# Patient Record
Sex: Female | Born: 1935 | Race: Black or African American | Hispanic: No | Smoking: Former smoker
Health system: Southern US, Community
[De-identification: ages and names within clinical notes are randomized; demographics above are authoritative.]

## PROBLEM LIST (undated history)

## (undated) DIAGNOSIS — Z0000 Encounter for general adult medical examination without abnormal findings: Secondary | ICD-10-CM

## (undated) DIAGNOSIS — M810 Age-related osteoporosis without current pathological fracture: Secondary | ICD-10-CM

## (undated) DIAGNOSIS — M129 Arthropathy, unspecified: Secondary | ICD-10-CM

## (undated) DIAGNOSIS — K635 Polyp of colon: Secondary | ICD-10-CM

## (undated) DIAGNOSIS — E785 Hyperlipidemia, unspecified: Secondary | ICD-10-CM

## (undated) DIAGNOSIS — J310 Chronic rhinitis: Secondary | ICD-10-CM

## (undated) DIAGNOSIS — Z9109 Other allergy status, other than to drugs and biological substances: Secondary | ICD-10-CM

## (undated) DIAGNOSIS — N189 Chronic kidney disease, unspecified: Secondary | ICD-10-CM

## (undated) DIAGNOSIS — K219 Gastro-esophageal reflux disease without esophagitis: Secondary | ICD-10-CM

## (undated) DIAGNOSIS — I10 Essential (primary) hypertension: Secondary | ICD-10-CM

## (undated) DIAGNOSIS — M8580 Other specified disorders of bone density and structure, unspecified site: Secondary | ICD-10-CM

## (undated) DIAGNOSIS — M1990 Unspecified osteoarthritis, unspecified site: Secondary | ICD-10-CM

## (undated) HISTORY — DX: Chronic rhinitis: J31.0

## (undated) HISTORY — DX: Chronic kidney disease, unspecified: N18.9

## (undated) HISTORY — DX: Age-related osteoporosis without current pathological fracture: M81.0

## (undated) HISTORY — DX: Encounter for general adult medical examination without abnormal findings: Z00.00

## (undated) HISTORY — DX: Arthropathy, unspecified: M12.9

## (undated) HISTORY — DX: Polyp of colon: K63.5

## (undated) HISTORY — DX: Morbid (severe) obesity due to excess calories: E66.01

## (undated) HISTORY — DX: Hyperlipidemia, unspecified: E78.5

## (undated) HISTORY — DX: Other specified disorders of bone density and structure, unspecified site: M85.80

## (undated) HISTORY — DX: Essential (primary) hypertension: I10

## (undated) HISTORY — DX: Other allergy status, other than to drugs and biological substances: Z91.09

---

## 1968-09-06 HISTORY — PX: FOOT SURGERY: SHX648

## 1969-09-06 HISTORY — PX: TUBAL LIGATION: SHX77

## 1980-09-06 HISTORY — PX: BUNIONECTOMY: SHX129

## 1998-09-04 ENCOUNTER — Ambulatory Visit (HOSPITAL_COMMUNITY): Admission: RE | Admit: 1998-09-04 | Discharge: 1998-09-04 | Payer: Self-pay | Admitting: Internal Medicine

## 1998-09-06 HISTORY — PX: GANGLION CYST EXCISION: SHX1691

## 2000-08-09 ENCOUNTER — Ambulatory Visit (HOSPITAL_BASED_OUTPATIENT_CLINIC_OR_DEPARTMENT_OTHER): Admission: RE | Admit: 2000-08-09 | Discharge: 2000-08-09 | Payer: Self-pay | Admitting: Orthopedic Surgery

## 2003-10-25 ENCOUNTER — Ambulatory Visit (HOSPITAL_COMMUNITY): Admission: RE | Admit: 2003-10-25 | Discharge: 2003-10-25 | Payer: Self-pay | Admitting: Internal Medicine

## 2003-10-28 ENCOUNTER — Ambulatory Visit (HOSPITAL_COMMUNITY): Admission: RE | Admit: 2003-10-28 | Discharge: 2003-10-28 | Payer: Self-pay | Admitting: Internal Medicine

## 2003-10-28 ENCOUNTER — Encounter (INDEPENDENT_AMBULATORY_CARE_PROVIDER_SITE_OTHER): Payer: Self-pay | Admitting: *Deleted

## 2004-09-02 ENCOUNTER — Ambulatory Visit: Payer: Self-pay | Admitting: Internal Medicine

## 2004-12-01 ENCOUNTER — Ambulatory Visit: Payer: Self-pay | Admitting: Internal Medicine

## 2005-03-04 ENCOUNTER — Ambulatory Visit: Payer: Self-pay | Admitting: Internal Medicine

## 2005-06-24 ENCOUNTER — Ambulatory Visit: Payer: Self-pay | Admitting: Internal Medicine

## 2005-08-20 ENCOUNTER — Ambulatory Visit: Payer: Self-pay | Admitting: Internal Medicine

## 2005-11-12 ENCOUNTER — Ambulatory Visit: Payer: Self-pay | Admitting: Internal Medicine

## 2005-12-14 ENCOUNTER — Ambulatory Visit: Payer: Self-pay | Admitting: Internal Medicine

## 2006-01-14 ENCOUNTER — Ambulatory Visit: Payer: Self-pay | Admitting: Internal Medicine

## 2006-01-17 ENCOUNTER — Ambulatory Visit: Payer: Self-pay | Admitting: Internal Medicine

## 2006-02-01 ENCOUNTER — Ambulatory Visit: Payer: Self-pay | Admitting: Internal Medicine

## 2006-02-04 ENCOUNTER — Ambulatory Visit: Payer: Self-pay | Admitting: Internal Medicine

## 2006-02-10 ENCOUNTER — Ambulatory Visit: Payer: Self-pay | Admitting: Internal Medicine

## 2006-02-22 ENCOUNTER — Ambulatory Visit: Payer: Self-pay | Admitting: Internal Medicine

## 2006-03-04 ENCOUNTER — Ambulatory Visit: Payer: Self-pay | Admitting: Internal Medicine

## 2006-03-14 ENCOUNTER — Ambulatory Visit: Payer: Self-pay | Admitting: Internal Medicine

## 2006-03-25 ENCOUNTER — Ambulatory Visit: Payer: Self-pay | Admitting: Internal Medicine

## 2006-04-08 ENCOUNTER — Ambulatory Visit: Payer: Self-pay | Admitting: Internal Medicine

## 2006-05-02 ENCOUNTER — Ambulatory Visit: Payer: Self-pay | Admitting: Internal Medicine

## 2006-05-20 ENCOUNTER — Ambulatory Visit: Payer: Self-pay | Admitting: Internal Medicine

## 2006-06-22 ENCOUNTER — Ambulatory Visit: Payer: Self-pay | Admitting: Internal Medicine

## 2006-06-22 LAB — CONVERTED CEMR LAB
ALT: 20 units/L (ref 0–40)
AST: 20 units/L (ref 0–37)
Albumin: 3.9 g/dL (ref 3.5–5.2)
Alkaline Phosphatase: 52 units/L (ref 39–117)
BUN: 16 mg/dL (ref 6–23)
Bilirubin Urine: NEGATIVE
CO2: 26 meq/L (ref 19–32)
Calcium: 9.8 mg/dL (ref 8.4–10.5)
Chloride: 108 meq/L (ref 96–112)
Chol/HDL Ratio, serum: 3.3
Cholesterol: 243 mg/dL (ref 0–200)
Creatinine, Ser: 1 mg/dL (ref 0.4–1.2)
Crystals: NEGATIVE
GFR calc non Af Amer: 58 mL/min
Glomerular Filtration Rate, Af Am: 70 mL/min/{1.73_m2}
Glucose, Bld: 110 mg/dL — ABNORMAL HIGH (ref 70–99)
HDL: 73.3 mg/dL (ref >39.0)
Ketones, ur: NEGATIVE mg/dL
LDL DIRECT: 144.1 mg/dL
Mucus, UA: NEGATIVE
Nitrite: NEGATIVE
Potassium: 4 meq/L (ref 3.5–5.1)
Sodium: 141 meq/L (ref 135–145)
Specific Gravity, Urine: 1.015 (ref 1.000–1.03)
Total Bilirubin: 0.8 mg/dL (ref 0.3–1.2)
Total CK: 407 units/L (ref 7–177)
Total Protein, Urine: NEGATIVE mg/dL
Total Protein: 6.8 g/dL (ref 6.0–8.3)
Triglyceride fasting, serum: 76 mg/dL (ref 0–149)
Urine Glucose: NEGATIVE mg/dL
Urobilinogen, UA: 0.2 (ref 0.0–1.0)
VLDL: 15 mg/dL (ref 0–40)
pH: 6.5 (ref 5.0–8.0)

## 2006-07-04 ENCOUNTER — Ambulatory Visit: Payer: Self-pay | Admitting: Internal Medicine

## 2006-07-04 ENCOUNTER — Encounter (INDEPENDENT_AMBULATORY_CARE_PROVIDER_SITE_OTHER): Payer: Self-pay | Admitting: *Deleted

## 2006-07-15 ENCOUNTER — Ambulatory Visit: Payer: Self-pay | Admitting: Internal Medicine

## 2006-08-05 ENCOUNTER — Ambulatory Visit: Payer: Self-pay | Admitting: Internal Medicine

## 2006-10-06 ENCOUNTER — Ambulatory Visit: Payer: Self-pay | Admitting: Internal Medicine

## 2006-10-06 LAB — CONVERTED CEMR LAB
ALT: 19 units/L (ref 0–40)
AST: 19 units/L (ref 0–37)
Albumin: 3.9 g/dL (ref 3.5–5.2)
Alkaline Phosphatase: 60 units/L (ref 39–117)
BUN: 16 mg/dL (ref 6–23)
Basophils Absolute: 0.1 10*3/uL (ref 0.0–0.1)
Basophils Relative: 1 % (ref 0.0–1.0)
Bilirubin Urine: NEGATIVE
Bilirubin, Direct: 0.1 mg/dL (ref 0.0–0.3)
CO2: 27 meq/L (ref 19–32)
CRP, High Sensitivity: 4 (ref 0.00–5.00)
Calcium: 9.6 mg/dL (ref 8.4–10.5)
Chloride: 108 meq/L (ref 96–112)
Cholesterol: 197 mg/dL (ref 0–200)
Creatinine, Ser: 0.8 mg/dL (ref 0.4–1.2)
Eosinophils Absolute: 0.1 10*3/uL (ref 0.0–0.6)
Eosinophils Relative: 1.4 % (ref 0.0–5.0)
GFR calc Af Amer: 90 mL/min
GFR calc non Af Amer: 75 mL/min
Glucose, Bld: 113 mg/dL — ABNORMAL HIGH (ref 70–99)
HCT: 38.2 % (ref 36.0–46.0)
HDL: 75.2 mg/dL (ref >39.0)
Hemoglobin, Urine: NEGATIVE
Hemoglobin: 13.2 g/dL (ref 12.0–15.0)
Ketones, ur: NEGATIVE mg/dL
LDL Cholesterol: 109 mg/dL — ABNORMAL HIGH (ref 0–99)
Leukocytes, UA: NEGATIVE
Lymphocytes Relative: 37.3 % (ref 12.0–46.0)
MCHC: 34.5 g/dL (ref 30.0–36.0)
MCV: 94.3 fL (ref 78.0–100.0)
Monocytes Absolute: 0.3 10*3/uL (ref 0.2–0.7)
Monocytes Relative: 5.8 % (ref 3.0–11.0)
Neutro Abs: 3.3 10*3/uL (ref 1.4–7.7)
Neutrophils Relative %: 54.5 % (ref 43.0–77.0)
Nitrite: NEGATIVE
Platelets: 267 10*3/uL (ref 150–400)
Potassium: 4.1 meq/L (ref 3.5–5.1)
RBC: 4.05 M/uL (ref 3.87–5.11)
RDW: 12.8 % (ref 11.5–14.6)
Sodium: 142 meq/L (ref 135–145)
Specific Gravity, Urine: 1.02 (ref 1.000–1.03)
TSH: 1.71 microintl units/mL (ref 0.35–5.50)
Total Bilirubin: 0.6 mg/dL (ref 0.3–1.2)
Total CHOL/HDL Ratio: 2.6
Total Protein, Urine: NEGATIVE mg/dL
Total Protein: 6.9 g/dL (ref 6.0–8.3)
Triglycerides: 64 mg/dL (ref 0–149)
Urine Glucose: NEGATIVE mg/dL
Urobilinogen, UA: 0.2 (ref 0.0–1.0)
VLDL: 13 mg/dL (ref 0–40)
WBC: 6 10*3/uL (ref 4.5–10.5)
pH: 6.5 (ref 5.0–8.0)

## 2006-10-31 ENCOUNTER — Ambulatory Visit: Payer: Self-pay | Admitting: Internal Medicine

## 2007-01-06 ENCOUNTER — Ambulatory Visit: Payer: Self-pay | Admitting: Internal Medicine

## 2007-01-26 ENCOUNTER — Ambulatory Visit: Payer: Self-pay | Admitting: Internal Medicine

## 2007-03-24 ENCOUNTER — Encounter: Admission: RE | Admit: 2007-03-24 | Discharge: 2007-03-24 | Payer: Self-pay | Admitting: Orthopaedic Surgery

## 2007-05-02 ENCOUNTER — Ambulatory Visit: Payer: Self-pay | Admitting: Internal Medicine

## 2007-05-03 ENCOUNTER — Ambulatory Visit: Payer: Self-pay | Admitting: Internal Medicine

## 2007-05-05 ENCOUNTER — Ambulatory Visit: Payer: Self-pay | Admitting: Internal Medicine

## 2007-05-05 LAB — CONVERTED CEMR LAB
ALT: 20 units/L (ref 0–35)
AST: 18 units/L (ref 0–37)
Albumin: 3.8 g/dL (ref 3.5–5.2)
Alkaline Phosphatase: 56 units/L (ref 39–117)
BUN: 14 mg/dL (ref 6–23)
Bilirubin, Direct: 0.1 mg/dL (ref 0.0–0.3)
CO2: 29 meq/L (ref 19–32)
CRP, High Sensitivity: 4 (ref 0.00–5.00)
Calcium: 9.7 mg/dL (ref 8.4–10.5)
Chloride: 108 meq/L (ref 96–112)
Cholesterol: 209 mg/dL (ref 0–200)
Creatinine, Ser: 0.8 mg/dL (ref 0.4–1.2)
Direct LDL: 117.1 mg/dL
GFR calc Af Amer: 90 mL/min
GFR calc non Af Amer: 75 mL/min
Glucose, Bld: 106 mg/dL — ABNORMAL HIGH (ref 70–99)
HDL: 69.1 mg/dL (ref >39.0)
Potassium: 4.2 meq/L (ref 3.5–5.1)
Sodium: 142 meq/L (ref 135–145)
Total Bilirubin: 0.6 mg/dL (ref 0.3–1.2)
Total CHOL/HDL Ratio: 3
Total Protein: 6.9 g/dL (ref 6.0–8.3)
Triglycerides: 103 mg/dL (ref 0–149)
VLDL: 21 mg/dL (ref 0–40)

## 2007-06-16 ENCOUNTER — Ambulatory Visit: Payer: Self-pay | Admitting: Internal Medicine

## 2007-08-11 ENCOUNTER — Ambulatory Visit: Payer: Self-pay | Admitting: Internal Medicine

## 2007-08-11 LAB — CONVERTED CEMR LAB
ALT: 21 units/L (ref 0–35)
AST: 18 units/L (ref 0–37)
Albumin: 3.7 g/dL (ref 3.5–5.2)
Alkaline Phosphatase: 54 units/L (ref 39–117)
BUN: 15 mg/dL (ref 6–23)
Bilirubin, Direct: 0.2 mg/dL (ref 0.0–0.3)
CO2: 28 meq/L (ref 19–32)
Calcium: 9.6 mg/dL (ref 8.4–10.5)
Chloride: 107 meq/L (ref 96–112)
Cholesterol: 195 mg/dL (ref 0–200)
Creatinine, Ser: 0.8 mg/dL (ref 0.4–1.2)
GFR calc Af Amer: 90 mL/min
GFR calc non Af Amer: 75 mL/min
Glucose, Bld: 100 mg/dL — ABNORMAL HIGH (ref 70–99)
HDL: 77.2 mg/dL (ref >39.0)
LDL Cholesterol: 108 mg/dL — ABNORMAL HIGH (ref 0–99)
Potassium: 4.4 meq/L (ref 3.5–5.1)
Sodium: 141 meq/L (ref 135–145)
Total Bilirubin: 0.8 mg/dL (ref 0.3–1.2)
Total CHOL/HDL Ratio: 2.5
Total Protein: 6.5 g/dL (ref 6.0–8.3)
Triglycerides: 47 mg/dL (ref 0–149)
VLDL: 9 mg/dL (ref 0–40)

## 2007-08-15 DIAGNOSIS — M129 Arthropathy, unspecified: Secondary | ICD-10-CM | POA: Insufficient documentation

## 2007-08-15 DIAGNOSIS — E785 Hyperlipidemia, unspecified: Secondary | ICD-10-CM | POA: Insufficient documentation

## 2007-08-15 DIAGNOSIS — J310 Chronic rhinitis: Secondary | ICD-10-CM | POA: Insufficient documentation

## 2007-08-18 ENCOUNTER — Encounter: Admission: RE | Admit: 2007-08-18 | Discharge: 2007-08-18 | Payer: Self-pay | Admitting: Orthopaedic Surgery

## 2007-10-20 ENCOUNTER — Ambulatory Visit: Payer: Self-pay | Admitting: Internal Medicine

## 2007-11-23 ENCOUNTER — Ambulatory Visit: Payer: Self-pay | Admitting: Internal Medicine

## 2007-11-23 DIAGNOSIS — D126 Benign neoplasm of colon, unspecified: Secondary | ICD-10-CM | POA: Insufficient documentation

## 2008-02-26 ENCOUNTER — Ambulatory Visit: Payer: Self-pay | Admitting: Internal Medicine

## 2008-03-27 ENCOUNTER — Encounter: Payer: Self-pay | Admitting: Internal Medicine

## 2008-04-09 ENCOUNTER — Ambulatory Visit: Payer: Self-pay | Admitting: Internal Medicine

## 2008-04-11 ENCOUNTER — Ambulatory Visit: Payer: Self-pay | Admitting: Internal Medicine

## 2008-05-31 ENCOUNTER — Ambulatory Visit: Payer: Self-pay | Admitting: Internal Medicine

## 2008-05-31 LAB — CONVERTED CEMR LAB
ALT: 19 units/L (ref 0–35)
AST: 18 units/L (ref 0–37)
Albumin: 4.1 g/dL (ref 3.5–5.2)
Alkaline Phosphatase: 61 units/L (ref 39–117)
BUN: 18 mg/dL (ref 6–23)
Bilirubin, Direct: 0.2 mg/dL (ref 0.0–0.3)
CO2: 27 meq/L (ref 19–32)
Calcium: 9.4 mg/dL (ref 8.4–10.5)
Chloride: 111 meq/L (ref 96–112)
Cholesterol: 237 mg/dL (ref 0–200)
Creatinine, Ser: 0.9 mg/dL (ref 0.4–1.2)
Direct LDL: 150.2 mg/dL
GFR calc Af Amer: 78 mL/min
GFR calc non Af Amer: 65 mL/min
Glucose, Bld: 119 mg/dL — ABNORMAL HIGH (ref 70–99)
HDL: 78.3 mg/dL (ref >39.0)
Potassium: 4.4 meq/L (ref 3.5–5.1)
Sodium: 144 meq/L (ref 135–145)
Total Bilirubin: 0.9 mg/dL (ref 0.3–1.2)
Total CHOL/HDL Ratio: 3
Total Protein: 7 g/dL (ref 6.0–8.3)
Triglycerides: 61 mg/dL (ref 0–149)
VLDL: 12 mg/dL (ref 0–40)

## 2008-08-07 ENCOUNTER — Ambulatory Visit: Payer: Self-pay | Admitting: Internal Medicine

## 2008-08-07 ENCOUNTER — Encounter: Payer: Self-pay | Admitting: Internal Medicine

## 2008-08-16 ENCOUNTER — Ambulatory Visit: Payer: Self-pay | Admitting: Internal Medicine

## 2008-08-16 DIAGNOSIS — R300 Dysuria: Secondary | ICD-10-CM | POA: Insufficient documentation

## 2008-08-17 LAB — CONVERTED CEMR LAB
ALT: 18 units/L (ref 0–35)
AST: 19 units/L (ref 0–37)
Albumin: 3.6 g/dL (ref 3.5–5.2)
Alkaline Phosphatase: 58 units/L (ref 39–117)
Bilirubin Urine: NEGATIVE
Bilirubin, Direct: 0.1 mg/dL (ref 0.0–0.3)
Cholesterol: 217 mg/dL (ref 0–200)
Crystals: NEGATIVE
Direct LDL: 117.6 mg/dL
HDL: 76.7 mg/dL (ref >39.0)
Hemoglobin, Urine: NEGATIVE
Ketones, ur: NEGATIVE mg/dL
Leukocytes, UA: NEGATIVE
Mucus, UA: NEGATIVE
Nitrite: POSITIVE — AB
RBC / HPF: NONE SEEN
Specific Gravity, Urine: 1.02 (ref 1.000–1.03)
Total Bilirubin: 0.7 mg/dL (ref 0.3–1.2)
Total CHOL/HDL Ratio: 2.8
Total Protein, Urine: NEGATIVE mg/dL
Total Protein: 6.5 g/dL (ref 6.0–8.3)
Triglycerides: 67 mg/dL (ref 0–149)
Urine Glucose: NEGATIVE mg/dL
Urobilinogen, UA: 0.2 (ref 0.0–1.0)
VLDL: 13 mg/dL (ref 0–40)
pH: 6.5 (ref 5.0–8.0)

## 2008-09-27 ENCOUNTER — Encounter: Payer: Self-pay | Admitting: Internal Medicine

## 2008-10-14 ENCOUNTER — Ambulatory Visit: Payer: Self-pay | Admitting: Internal Medicine

## 2008-12-13 ENCOUNTER — Ambulatory Visit: Payer: Self-pay | Admitting: Internal Medicine

## 2008-12-13 DIAGNOSIS — R059 Cough, unspecified: Secondary | ICD-10-CM | POA: Insufficient documentation

## 2008-12-13 DIAGNOSIS — R05 Cough: Secondary | ICD-10-CM

## 2008-12-14 LAB — CONVERTED CEMR LAB
ALT: 19 units/L (ref 0–35)
AST: 18 units/L (ref 0–37)
Albumin: 3.8 g/dL (ref 3.5–5.2)
Alkaline Phosphatase: 53 units/L (ref 39–117)
BUN: 16 mg/dL (ref 6–23)
Basophils Absolute: 0 10*3/uL (ref 0.0–0.1)
Basophils Relative: 0.8 % (ref 0.0–3.0)
Bilirubin, Direct: 0.1 mg/dL (ref 0.0–0.3)
CO2: 28 meq/L (ref 19–32)
CRP, High Sensitivity: 5 (ref 0.00–5.00)
Calcium: 9.1 mg/dL (ref 8.4–10.5)
Chloride: 111 meq/L (ref 96–112)
Cholesterol: 239 mg/dL — ABNORMAL HIGH (ref 0–200)
Creatinine, Ser: 0.8 mg/dL (ref 0.4–1.2)
Direct LDL: 136.4 mg/dL
Eosinophils Absolute: 0.3 10*3/uL (ref 0.0–0.7)
Eosinophils Relative: 5.7 % — ABNORMAL HIGH (ref 0.0–5.0)
GFR calc non Af Amer: 89.69 mL/min (ref >60)
Glucose, Bld: 106 mg/dL — ABNORMAL HIGH (ref 70–99)
HCT: 37.2 % (ref 36.0–46.0)
HDL: 78.4 mg/dL (ref >39.00)
Hemoglobin: 12.4 g/dL (ref 12.0–15.0)
Lymphocytes Relative: 37.7 % (ref 12.0–46.0)
Lymphs Abs: 2.1 10*3/uL (ref 0.7–4.0)
MCHC: 33.4 g/dL (ref 30.0–36.0)
MCV: 97.8 fL (ref 78.0–100.0)
Monocytes Absolute: 0.4 10*3/uL (ref 0.1–1.0)
Monocytes Relative: 6.5 % (ref 3.0–12.0)
Neutro Abs: 2.9 10*3/uL (ref 1.4–7.7)
Neutrophils Relative %: 49.3 % (ref 43.0–77.0)
Platelets: 248 10*3/uL (ref 150.0–400.0)
Potassium: 3.9 meq/L (ref 3.5–5.1)
RBC: 3.8 M/uL — ABNORMAL LOW (ref 3.87–5.11)
RDW: 13.4 % (ref 11.5–14.6)
Sodium: 144 meq/L (ref 135–145)
TSH: 2.2 microintl units/mL (ref 0.35–5.50)
Total Bilirubin: 0.9 mg/dL (ref 0.3–1.2)
Total CHOL/HDL Ratio: 3
Total Protein: 6.6 g/dL (ref 6.0–8.3)
Triglycerides: 72 mg/dL (ref 0.0–149.0)
VLDL: 14.4 mg/dL (ref 0.0–40.0)
WBC: 5.7 10*3/uL (ref 4.5–10.5)

## 2009-02-14 ENCOUNTER — Ambulatory Visit: Payer: Self-pay | Admitting: Internal Medicine

## 2009-02-17 LAB — CONVERTED CEMR LAB
ALT: 18 units/L (ref 0–35)
AST: 18 units/L (ref 0–37)
Albumin: 3.8 g/dL (ref 3.5–5.2)
Alkaline Phosphatase: 64 units/L (ref 39–117)
Bilirubin, Direct: 0 mg/dL (ref 0.0–0.3)
CRP, High Sensitivity: 4 (ref 0.00–5.00)
Cholesterol: 250 mg/dL — ABNORMAL HIGH (ref 0–200)
Direct LDL: 161.6 mg/dL
HDL: 74.3 mg/dL (ref >39.00)
Total Bilirubin: 0.8 mg/dL (ref 0.3–1.2)
Total CHOL/HDL Ratio: 3
Total Protein: 6.8 g/dL (ref 6.0–8.3)
Triglycerides: 72 mg/dL (ref 0.0–149.0)
VLDL: 14.4 mg/dL (ref 0.0–40.0)

## 2009-04-16 ENCOUNTER — Ambulatory Visit: Payer: Self-pay | Admitting: Internal Medicine

## 2009-05-14 ENCOUNTER — Ambulatory Visit: Payer: Self-pay | Admitting: Internal Medicine

## 2009-06-20 ENCOUNTER — Ambulatory Visit: Payer: Self-pay | Admitting: Internal Medicine

## 2009-09-25 ENCOUNTER — Ambulatory Visit: Payer: Self-pay | Admitting: Internal Medicine

## 2009-10-03 ENCOUNTER — Encounter: Payer: Self-pay | Admitting: Internal Medicine

## 2009-10-16 ENCOUNTER — Encounter: Payer: Self-pay | Admitting: Internal Medicine

## 2009-12-18 ENCOUNTER — Encounter: Payer: Self-pay | Admitting: Internal Medicine

## 2009-12-24 ENCOUNTER — Ambulatory Visit: Payer: Self-pay | Admitting: Internal Medicine

## 2009-12-24 DIAGNOSIS — M810 Age-related osteoporosis without current pathological fracture: Secondary | ICD-10-CM | POA: Insufficient documentation

## 2009-12-25 LAB — CONVERTED CEMR LAB
ALT: 18 units/L (ref 0–35)
AST: 17 units/L (ref 0–37)
Albumin: 3.9 g/dL (ref 3.5–5.2)
Alkaline Phosphatase: 63 units/L (ref 39–117)
BUN: 16 mg/dL (ref 6–23)
Basophils Absolute: 0 10*3/uL (ref 0.0–0.1)
Basophils Relative: 0.5 % (ref 0.0–3.0)
Bilirubin Urine: NEGATIVE
Bilirubin, Direct: 0.1 mg/dL (ref 0.0–0.3)
CO2: 30 meq/L (ref 19–32)
Calcium: 9.7 mg/dL (ref 8.4–10.5)
Chloride: 108 meq/L (ref 96–112)
Cholesterol: 234 mg/dL — ABNORMAL HIGH (ref 0–200)
Creatinine, Ser: 0.9 mg/dL (ref 0.4–1.2)
Direct LDL: 146.1 mg/dL
Eosinophils Absolute: 0.2 10*3/uL (ref 0.0–0.7)
Eosinophils Relative: 3.1 % (ref 0.0–5.0)
GFR calc non Af Amer: 78.08 mL/min (ref >60)
Glucose, Bld: 105 mg/dL — ABNORMAL HIGH (ref 70–99)
HCT: 36.2 % (ref 36.0–46.0)
HDL: 67.4 mg/dL (ref >39.00)
Hemoglobin, Urine: NEGATIVE
Hemoglobin: 12.2 g/dL (ref 12.0–15.0)
Ketones, ur: NEGATIVE mg/dL
Lymphocytes Relative: 31.8 % (ref 12.0–46.0)
Lymphs Abs: 2.1 10*3/uL (ref 0.7–4.0)
MCHC: 33.7 g/dL (ref 30.0–36.0)
MCV: 96 fL (ref 78.0–100.0)
Monocytes Absolute: 0.5 10*3/uL (ref 0.1–1.0)
Monocytes Relative: 7.5 % (ref 3.0–12.0)
Neutro Abs: 3.8 10*3/uL (ref 1.4–7.7)
Neutrophils Relative %: 57.1 % (ref 43.0–77.0)
Nitrite: NEGATIVE
Platelets: 297 10*3/uL (ref 150.0–400.0)
Potassium: 4.3 meq/L (ref 3.5–5.1)
RBC: 3.78 M/uL — ABNORMAL LOW (ref 3.87–5.11)
RDW: 14 % (ref 11.5–14.6)
Sodium: 145 meq/L (ref 135–145)
Specific Gravity, Urine: 1.02 (ref 1.000–1.030)
TSH: 2.25 microintl units/mL (ref 0.35–5.50)
Total Bilirubin: 0.5 mg/dL (ref 0.3–1.2)
Total CHOL/HDL Ratio: 3
Total Protein, Urine: NEGATIVE mg/dL
Total Protein: 6.6 g/dL (ref 6.0–8.3)
Triglycerides: 72 mg/dL (ref 0.0–149.0)
Urine Glucose: NEGATIVE mg/dL
Urobilinogen, UA: 0.2 (ref 0.0–1.0)
VLDL: 14.4 mg/dL (ref 0.0–40.0)
Vit D, 25-Hydroxy: 15 ng/mL — ABNORMAL LOW (ref 30–89)
WBC: 6.7 10*3/uL (ref 4.5–10.5)
pH: 7 (ref 5.0–8.0)

## 2010-01-02 ENCOUNTER — Ambulatory Visit: Payer: Self-pay | Admitting: Internal Medicine

## 2010-01-02 ENCOUNTER — Encounter: Payer: Self-pay | Admitting: Internal Medicine

## 2010-03-18 ENCOUNTER — Ambulatory Visit: Payer: Self-pay | Admitting: Internal Medicine

## 2010-03-18 DIAGNOSIS — R238 Other skin changes: Secondary | ICD-10-CM | POA: Insufficient documentation

## 2010-03-18 DIAGNOSIS — S60229A Contusion of unspecified hand, initial encounter: Secondary | ICD-10-CM | POA: Insufficient documentation

## 2010-03-18 LAB — CONVERTED CEMR LAB
Basophils Absolute: 0 10*3/uL (ref 0.0–0.1)
Basophils Relative: 0.7 % (ref 0.0–3.0)
Eosinophils Absolute: 0.1 10*3/uL (ref 0.0–0.7)
Eosinophils Relative: 2 % (ref 0.0–5.0)
HCT: 35.9 % — ABNORMAL LOW (ref 36.0–46.0)
Hemoglobin: 12.3 g/dL (ref 12.0–15.0)
Lymphocytes Relative: 37.8 % (ref 12.0–46.0)
Lymphs Abs: 1.9 10*3/uL (ref 0.7–4.0)
MCHC: 34.3 g/dL (ref 30.0–36.0)
MCV: 96.1 fL (ref 78.0–100.0)
Monocytes Absolute: 0.3 10*3/uL (ref 0.1–1.0)
Monocytes Relative: 6.5 % (ref 3.0–12.0)
Neutro Abs: 2.7 10*3/uL (ref 1.4–7.7)
Neutrophils Relative %: 53 % (ref 43.0–77.0)
Platelets: 246 10*3/uL (ref 150.0–400.0)
RBC: 3.73 M/uL — ABNORMAL LOW (ref 3.87–5.11)
RDW: 14.1 % (ref 11.5–14.6)
WBC: 5.1 10*3/uL (ref 4.5–10.5)

## 2010-04-30 ENCOUNTER — Ambulatory Visit: Payer: Self-pay | Admitting: Internal Medicine

## 2010-05-01 LAB — CONVERTED CEMR LAB
Cholesterol: 301 mg/dL — ABNORMAL HIGH (ref 0–200)
Direct LDL: 194.2 mg/dL
HDL: 80.5 mg/dL (ref >39.00)
Total CHOL/HDL Ratio: 4
Triglycerides: 57 mg/dL (ref 0.0–149.0)
VLDL: 11.4 mg/dL (ref 0.0–40.0)
Vit D, 25-Hydroxy: 41 ng/mL (ref 30–89)

## 2010-05-21 ENCOUNTER — Ambulatory Visit: Payer: Self-pay | Admitting: Internal Medicine

## 2010-07-15 ENCOUNTER — Ambulatory Visit: Payer: Self-pay | Admitting: Internal Medicine

## 2010-07-16 LAB — CONVERTED CEMR LAB
BUN: 19 mg/dL (ref 6–23)
CO2: 28 meq/L (ref 19–32)
Calcium: 9.7 mg/dL (ref 8.4–10.5)
Chloride: 108 meq/L (ref 96–112)
Cholesterol: 234 mg/dL — ABNORMAL HIGH (ref 0–200)
Creatinine, Ser: 1 mg/dL (ref 0.4–1.2)
Direct LDL: 140.9 mg/dL
GFR calc non Af Amer: 72.37 mL/min (ref >60)
Glucose, Bld: 103 mg/dL — ABNORMAL HIGH (ref 70–99)
HDL: 79.5 mg/dL (ref >39.00)
Potassium: 4.4 meq/L (ref 3.5–5.1)
Sodium: 143 meq/L (ref 135–145)
Total CHOL/HDL Ratio: 3
Triglycerides: 72 mg/dL (ref 0.0–149.0)
VLDL: 14.4 mg/dL (ref 0.0–40.0)

## 2010-10-04 LAB — CONVERTED CEMR LAB
ALT: 20 units/L (ref 0–35)
AST: 18 units/L (ref 0–37)
Albumin: 3.7 g/dL (ref 3.5–5.2)
Alkaline Phosphatase: 55 units/L (ref 39–117)
BUN: 18 mg/dL (ref 6–23)
Basophils Absolute: 0.1 10*3/uL (ref 0.0–0.1)
Basophils Relative: 1.2 % — ABNORMAL HIGH (ref 0.0–1.0)
Bilirubin, Direct: 0.1 mg/dL (ref 0.0–0.3)
CO2: 29 meq/L (ref 19–32)
CRP, High Sensitivity: 2 (ref 0.00–5.00)
Calcium: 9.2 mg/dL (ref 8.4–10.5)
Chloride: 108 meq/L (ref 96–112)
Cholesterol: 206 mg/dL (ref 0–200)
Creatinine, Ser: 0.9 mg/dL (ref 0.4–1.2)
Direct LDL: 122.4 mg/dL
Eosinophils Absolute: 0.3 10*3/uL (ref 0.0–0.6)
Eosinophils Relative: 5.5 % — ABNORMAL HIGH (ref 0.0–5.0)
GFR calc Af Amer: 79 mL/min
GFR calc non Af Amer: 65 mL/min
Glucose, Bld: 98 mg/dL (ref 70–99)
HCT: 37 % (ref 36.0–46.0)
HDL: 69.9 mg/dL (ref >39.0)
Hemoglobin: 12 g/dL (ref 12.0–15.0)
Lymphocytes Relative: 36.7 % (ref 12.0–46.0)
MCHC: 32.3 g/dL (ref 30.0–36.0)
MCV: 97.5 fL (ref 78.0–100.0)
Monocytes Absolute: 0.4 10*3/uL (ref 0.2–0.7)
Monocytes Relative: 6.9 % (ref 3.0–11.0)
Neutro Abs: 2.6 10*3/uL (ref 1.4–7.7)
Neutrophils Relative %: 49.7 % (ref 43.0–77.0)
Platelets: 269 10*3/uL (ref 150–400)
Potassium: 3.8 meq/L (ref 3.5–5.1)
RBC: 3.8 M/uL — ABNORMAL LOW (ref 3.87–5.11)
RDW: 13.2 % (ref 11.5–14.6)
Sodium: 142 meq/L (ref 135–145)
TSH: 1.75 microintl units/mL (ref 0.35–5.50)
Total Bilirubin: 0.8 mg/dL (ref 0.3–1.2)
Total CHOL/HDL Ratio: 2.9
Total Protein: 6.5 g/dL (ref 6.0–8.3)
Triglycerides: 65 mg/dL (ref 0–149)
VLDL: 13 mg/dL (ref 0–40)
WBC: 5.3 10*3/uL (ref 4.5–10.5)

## 2010-10-06 NOTE — Assessment & Plan Note (Signed)
Summary: yearly/apc   Primary Provider/Referring Provider:  Sherene Sires  CC:  One year follow-up for allergies. Pt has no complaints at this time.Marland Kitchen  History of Present Illness: 04/11/08-77 year old woman returning for one-year follow-up of allergic rhinitis.  Dr. Sherene Sires remains her primary doctor. She continues to get our allergy vaccine at 1:10, 0.5-ML per week, from the more convenient [place] allergy office of Dr. Oretha Milch..  They send copies of her skin test record and no problems are reported.  I again discussed risk and benefit considerations with her.  She states that allergy vaccine has helped her and that she wants to continue.  05/14/09 Chronic allergic rhinitis, cough Has continued getting her allergy vaccine from [name], administered for convenience at the allergy office of Dr Oretha Milch [facility] in [facility]. She says allergy vaccine helps and she's had no problems, with no need to make changes this year. She notices a little more nasal congestion this time of year. In the Spring she had more sneeze and Dr Sherene Sires gave a prednisone course which worked well. She did very wel lin [place] for vacation in [place]. Post nasal drip is blamed for mild cough, with sense that rarely she will have enough to make her a little nauseated. Zyrtec and Sudafed help if needed, but complains of cost of Zyrtec.   Current Medications (verified): 1)  Adult Aspirin Low Strength 81 Mg  Tbdp (Aspirin) .... Take 1 Tablet By Mouth Once A Day 2)  Citrucel Fibershake   Powd (Methylcellulose (Laxative)) 3)  Centrum Silver   Tabs (Multiple Vitamins-Minerals) .... Once Daily 4)  Tums 500 Mg  Chew (Calcium Carbonate Antacid) .... Once Daily 5)  Simvastatin 40 Mg  Tabs (Simvastatin) .... One Half Each Evening 6)  Aleve 220 Mg  Tabs (Naproxen Sodium) .... Take As Needed For Back or Joint Pain 7)  Zyrtec Allergy 10 Mg  Tabs (Cetirizine Hcl) .... Take 1 Tab By Mouth At Bedtime As Needed For Itchy Sneezy Runny Nose  8)  Sudafed Pe Maximum Strength 10 Mg  Tabs (Phenylephrine Hcl) .... As Needed 9)  Allergy Shots .... Once A Week  Allergies (verified): No Known Drug Allergies  Past History:  Past Medical History: Last updated: 12/13/2008 ARTHRITIS (ICD-716.90) Right clavicular head swelling 22025......................................................................Marland KitchenDr Noel Gerold RHINITIS, CHRONIC (ICD-472.0)...............................................................................Marland KitchenDr Linward Foster     - on immunotherapy since 5/08 HYPERLIPIDEMIA (ICD-272.4)    -target LDL less than 160 MORBID OBESITY (ICD-278.01)     -  ideal 142  Target wt  = 158   for BMI < 30  COLON POLYPS    -  colonoscopy 06/24/2006 with no adenomatous change, hyperplasia only OSTEOPENIA     -BMD  08/07/08  tspine -1.2,  Left femur -.4,  right femur +.2 Health Maintenance    - CPX December 13, 2008     - Td 1/05    - Pneumvax 2000   -  GYN [name] ([place])    Family History: Last updated: 08/16/2008 Family History Colon Cancer in mother (in computer for recall colonoscopy 10/12) she is an only child and knows nothing about her father's health  Social History: Last updated: 04/19/2008 Patient states former smoker quit as teenager Negative history of passive tobacco smoke exposure.  Exercise-2 times weekly Caffeine-2 cups daily Marrie dwith 4 children  Risk Factors: Smoking Status: quit (11/23/2007) Passive Smoke Exposure: no (04/19/2008)  Review of Systems      See HPI  The patient denies anorexia, fever, weight loss,  weight gain, vision loss, decreased hearing, hoarseness, chest pain, syncope, dyspnea on exertion, peripheral edema, prolonged cough, headaches, hemoptysis, abdominal pain, melena, hematochezia, and severe indigestion/heartburn.    Vital Signs:  Patient profile:   77 year old female Height:      62 inches Weight:      167 pounds O2 Sat:      99 % on Room air Pulse rate:   71 / minute BP  sitting:   136 / 70  (right arm) Cuff size:   regular  Vitals Entered By: Carron Curie CMA (May 14, 2009 11:02 AM)  O2 Flow:  Room air CC: One year follow-up for allergies. Pt has no complaints at this time. Comments Medications reviewed with patient Carron Curie [facility]  May 14, 2009 11:02 AM    Physical Exam  Additional Exam:  wt  172 August 16, 2008 >  173 December 13, 2008 > 169 February 14, 2009 ;> 167  May 14, 2009  Ambulatory healthy appearing in no acute distress. Skin: no rash or excoriation Afeb with normal vital signs HEENT: dentures, nasal mucosa clear with minor mucus bridging, no polyps. Nl external ear canals  Neck without JVD/Nodes/TM Lungs clear to A and P bilaterally without cough on insp or exp maneuvers RRR no s3 or murmur or increase in P2.  no edema Abd soft and benign  Ext warm without calf tenderness, cyanosis clubbing  Neuro- grossly intact to observation    Impression & Recommendations:  Problem # 1:  RHINITIS, CHRONIC (ICD-472.0)  Allergic rhinitis. Fair control on allergy vaccine. She has not been using antihistamines often, partly because doing well and partly due to cost. Wil give generic names. We discussed "allergy" vs "sinus". She wants to continue allergy vaccine and she is satisfied with her arrangement, getting allergy management through [name] by her choice, but with her injections given at allergist office in [facility] for logistical convenience.  Other Orders: Est. Patient Level III (70350)  Patient Instructions: 1)  Please schedule a follow-up appointment in 1 year. 2)  OK to continue your allergy shots. We will keep a watch on how you are doing and whether the continue to help. We can stop your shots, or re-do your allergy testing as needed. 3)  Consider cetirizine as a cheaper generic form of Zyrtec 4)  or try lortadine, a cheaper generic form of Claritin. These are both anithistamines for allergy nasal congestion,  sneezing and runny nose, available at your drug store.

## 2010-10-06 NOTE — Assessment & Plan Note (Signed)
Summary: flu vacc ///kp  Nurse Visit   Allergies: No Known Drug Allergies  Immunizations Administered:  Influenza Vaccine # 1:    Vaccine Type: Fluvax MCR    Site: right deltoid    Mfr: GlaxoSmithKline    Dose: 0.5 ml    Route: IM    Given by: Michel Bickers CMA    Exp. Date: 03/05/2010    Lot #: [id number]    VIS given: 03/30/07 version given June 20, 2009.  Flu Vaccine Consent Questions:    Do you have a history of severe allergic reactions to this vaccine? no    Any prior history of allergic reactions to egg and/or gelatin? no    Do you have a sensitivity to the preservative Thimersol? no    Do you have a past history of Guillan-Barre Syndrome? no    Do you currently have an acute febrile illness? no    Have you ever had a severe reaction to latex? no    Vaccine information given and explained to patient? yes    Are you currently pregnant? no  Orders Added: 1)  Influenza Vaccine MCR [00025]

## 2010-10-06 NOTE — Assessment & Plan Note (Signed)
Summary: CPX/ MBW   Visit Type:  CPX PCP:  Ayomide Zuleta  Chief Complaint:  cpx fasting.Marland Kitchen  History of Present Illness: 89 ybf remote minimal smoking hx with seasonal rhiinits flare since 2/09 esp in am, previously evaluated by Dr. Maple Hudson and on a combination of Zyrtec at bedtime plus Sudafed p.r.n. with some improvement but not eradication especially in the mornings.  She denies any associated wheezing itching sneezing or sinus pain appearance regions.  The main reason for visit today is to follow up long-term problems with obesity complicated by hyperlipidemia and borderline diabetes and hypertension.  She has never been under treatment for either diabetes or hypertension to date other than what diet and lifestyle modification, which he finds difficult, especially over the winter.  However, she denies any exertional chest pain orthopnea PND TIA or claudication symptoms at present.  Current Meds:  ADULT ASPIRIN LOW STRENGTH 81 MG  TBDP (ASPIRIN) Take 1 tablet by mouth once a day VYTORIN 10-40 MG  TABS (EZETIMIBE-SIMVASTATIN) Take 1 tab by mouth at bedtime ZYRTEC ALLERGY 10 MG  TABS (CETIRIZINE HCL) Take 1 tab by mouth at bedtime as needed CITRUCEL FIBERSHAKE   POWD (METHYLCELLULOSE (LAXATIVE))  CENTRUM SILVER   TABS (MULTIPLE VITAMINS-MINERALS) once daily TUMS 500 MG  CHEW (CALCIUM CARBONATE ANTACID) once daily  p.r.n. Sudafed     Current Allergies: No known allergies   Past Medical History:    Reviewed history and no changes required:       Current Problems:        ARTHRITIS (ICD-716.90)       RHINITIS, CHRONIC (ICD-472.0)       HYPERLIPIDEMIA (ICD-272.4) target LDL less than 160       MORBID OBESITY (ICD-278.01)       colon polyps, see last colonoscopy 06/24/2006 with no adenomatous change, hyperplasia only           Family History:    Reviewed history and no changes required:       Family History Colon Cancer in mother  Social History:    Reviewed history and no changes required:        Patient states former smoker quit as teenager   Risk Factors:  Tobacco use:  quit   Review of Systems  The patient denies anorexia, fever, weight loss, vision loss, decreased hearing, hoarseness, chest pain, syncope, dyspnea on exhertion, peripheral edema, prolonged cough, hemoptysis, abdominal pain, melena, hematochezia, severe indigestion/heartburn, hematuria, incontinence, muscle weakness, suspicious skin lesions, transient blindness, difficulty walking, depression, unusual weight change, abnormal bleeding, enlarged lymph nodes, and angioedema.         GYN health care and mammograms in [place]   Vital Signs:  Patient Profile:   77 Years Old Female Height:     62.5 inches Weight:      170.38 pounds O2 Sat:      99 % O2 treatment:    Room Air Temp:     98.1 degrees F oral Pulse rate:   86 / minute BP sitting:   130 / 82  (left arm)  Vitals Entered By: Vernie Murders (November 23, 2007 8:52 AM)                 Physical Exam     Ambulatory healthy appearing in no acute distress. Afeb with normal vital signs. note weight is up 8 pounds from last year to 170 now. HEENT: nl dentition, turbinates, and orophanx. Nl external ear canals without cough reflex. edentulous with dentures in  place Neck without JVD/Nodes/TM Lungs clear to A and P bilaterally without cough on insp or exp maneuvers RRR no s3 or murmur or increase in P2   No displacement of PMI Abd soft and benign with nl excursion in the supine position. No bruits or organomegaly femoral pulses present bilaterally Ext warm without calf tenderness, cyanosis clubbing or edema pedal pulses present and symmetric Skin warm and dry without lesions  musculoskeletal exam is normal Neurologic exam reveals normal sensorium, normal sensory motor cerebellar testing with no pathologic reflex     Impression & Recommendations:  Problem # 1:  HYPERLIPIDEMIA (ICD-272.4)  Her updated medication list for this problem  includes:    Vytorin 10-40 Mg Tabs (Ezetimibe-simvastatin) .Marland Kitchen... Take 1 tab by mouth at bedtime LDL cholesterol is at target on this regimen of 122 with a CRP of two no change in Rx  Orders: TLB-Lipid Panel (80061-LIPID) TLB-BMP (Basic Metabolic Panel-BMET) (80048-METABOL) TLB-Hepatic/Liver Function Pnl (80076-HEPATIC) TLB-TSH (Thyroid Stimulating Hormone) (84443-TSH) TLB-CRP-Full Range (C-Reactive Protein) (86140-FCRP) Est. Patient Level V (16109)   Problem # 2:  MORBID OBESITY (ICD-278.01) this is the patient's greatest long-term challenge and was reviewed with her using an allergy to a checkbook where calories take the place of currency. see intstuctions. Her blood pressure is borderline elevated.  She has no evidence of secondary diabetes at this point. Orders: Est. Patient Level V (60454)   Problem # 3:  RHINITIS, CHRONIC (ICD-472.0) she has documented allergic rhinitis. this is her greatest ongoing concern and has been refractory to treatment with Zyrtec and Sudafed.  I therefore recommended a trial of asked to pro one to two puffs every 6 hours as needed with follow-up by Dr. Maple Hudson, her allergist of record, if not improving.  Orders: TLB-CBC Platelet - w/Differential (85025-CBCD) Est. Patient Level V (09811)   Problem # 4:  COLONIC POLYPS, HYPERPLASTIC (ICD-211.3) in the computer for recall and 2012  Medications Added to Medication List This Visit: 1)  Centrum Silver Tabs (Multiple vitamins-minerals) .... Once daily 2)  Tums 500 Mg Chew (Calcium carbonate antacid) .... Once daily  Other Orders: EKG w/ Interpretation (93000)   Patient Instructions: 1)  Weight control is simply a matter of calorie balance which needs to be tilted in your favor by eating less and exercising more.  To get the most out of exercise, you need to be continuously aware that you are short of breath, but never out of breath, for 30 minutes daily. As you improve, it will actually be easier for you  to do the same amount in  30 minutes so always push to the level where you are short of breath.  If this does not result in gradual weight reduction,  I recommend  a nutritionist for a food diary. 2)  Please schedule a follow-up appointment in 3 months. 3)  try astepro 1-2 puffs every 6 hours if helps, we'll call in rx A   ]

## 2010-10-06 NOTE — Miscellaneous (Signed)
Summary: BONE DENSITY  Clinical Lists Changes  Orders: Added new Test order of T-Bone Densitometry (77080) - Signed Added new Test order of T-Lumbar Vertebral Assessment (77082) - Signed 

## 2010-10-06 NOTE — Assessment & Plan Note (Signed)
Summary: Pulmonary/ f/u ov multiple issues   Primary Provider/Referring Provider:  Sherene Sires  CC:  acute visit.  Pt c/o unexplained bruise on her rt hand- not painful.  She also c/o cramping in her hands and feet x several months- she questions if this is from taking statin.[name]  History of Present Illness: 43 ybf remote minimal smoking hx with seasonal rhiinits flares  better overall since started on immunotherapy  May2008   02/26/08 ov:  change from vytorin to generic zocor  May 31, 2008 ov: stopped the Zocor due to muscle aches although the amount of statin was the same as her Vytorin so rechallenged with 20 mg per day  February 14, 2009 ov f/u lipid profile, no new symptoms, denies tia or claudication, cp. tolerating zocor, no change in rx.    December 24, 2009 sick x 2 weeks itching, sneezing runny watery no purulent secretions, no sob. already tried zyrtec and sudafed.  rec pred dose pack  March 18, 2010 ov c/o unexplained bruise on her rt hand x one week- not painful.  She also c/o cramping in her hands and feet x several months- she questions if this is from taking statin. no injury, no other bruising/bleeding. Pt denies any significant sore throat, dysphagia, itching, sneezing,  nasal congestion or excess secretions,  fever, chills, sweats, unintended wt loss, pleuritic or exertional cp, hempoptysis, change in activity tolerance  orthopnea pnd or leg swelling    Current Medications (verified): 1)  Adult Aspirin Low Strength 81 Mg  Tbdp (Aspirin) .... Take 1 Tablet By Mouth Once A Day 2)  Centrum Silver   Tabs (Multiple Vitamins-Minerals) .... Once Daily 3)  Tums 500 Mg  Chew (Calcium Carbonate Antacid) .... Once Daily 4)  Simvastatin 40 Mg  Tabs (Simvastatin) .... One Half Each Evening 5)  Allergy Vaccine 1:10 Go (W-E) .... Once A Week 6)  Aleve 220 Mg  Tabs (Naproxen Sodium) .... Take As Needed For Back or Joint Pain 7)  Zyrtec Allergy 10 Mg  Tabs (Cetirizine Hcl) .... Take 1 Tab By Mouth  At Bedtime As Needed For Itchy Sneezy Runny Nose 8)  Sudafed Pe Maximum Strength 10 Mg  Tabs (Phenylephrine Hcl) .... As Needed 9)  Epipen 0.3 Mg/0.68ml Devi (Epinephrine) .... For Severe Allergic Reaction 10)  Vitamin D (Ergocalciferol) 50000 Unit Caps (Ergocalciferol) .[name].. 1 By Mouth Twice Wkly X 12 Wks Then Stop  Allergies (verified): No Known Drug Allergies  Past History:  Past Medical History: ARTHRITIS (ICD-716.90) Right clavicular head swelling 64403......................................................................Mackenzie KitchenDr Noel Gerold RHINITIS, CHRONIC (ICD-472.0)...............................................................................Mackenzie KitchenDr Linward Foster     - on immunotherapy since 5/08 HYPERLIPIDEMIA (ICD-272.4)    -target LDL less than 160 > try off due to musle aches March 18, 2010  MORBID OBESITY (ICD-278.01)     -  ideal 142  Target wt  = 158   for BMI < 30  COLON POLYPS    -  colonoscopy 06/24/2006 with no adenomatous change, hyperplasia only OSTEOPENIA     -BMD  08/07/08  tspine -1.2,  Left femur -.4,  right femur +.2     - Repeat 01/02/2010       1.9                    0.1                     0.5 Health Maintenance    - CPX December 24, 2009     - Td 1/05    -  Pneumvax 2000 age 18   -  GYN [name] ([place])    Vital Signs:  Patient profile:   77 year old female Weight:      166.13 pounds O2 Sat:      98 % on Room air Temp:     98.2 degrees F oral Pulse rate:   76 / minute BP sitting:   114 / 66  (left arm)  Vitals Entered By: Vernie Murders (March 18, 2010 9:32 AM)  O2 Flow:  Room air  Physical Exam  Additional Exam:  wt  172 August 16, 2008 >  173 December 13, 2008 > 169 February 14, 2009 ;> 167  May 14, 2009 > 169 December 27, 2009  > 166 March 18, 2010  Ambulatory healthy appearing in no acute distress. Skin: no rash or excoriation Afeb with normal vital signs HEENT full: dentures, nasal mucosa clear with minor mucus bridging, no polyps. Nl external ear canals    Neck without JVD/Nodes/TM Lungs clear to A and P bilaterally without cough on insp or exp maneuver RRR no s3 or murmur or increase in P2.  no edema Abd soft and benign  Ext warm without calf tenderness, cyanosis clubbing - quarter sized ecchymosis right thumb base with from Skin no other bruises or petchiae   White Cell Count          5.1 K/uL                    4.5-10.5   Red Cell Count       [L]  3.73 Mil/uL                 3.87-5.11   Hemoglobin                12.3 g/dL                   16.1-09.6   Hematocrit           [L]  35.9 %                      36.0-46.0   MCV                       96.1 fl                     78.0-100.0   MCHC                      34.3 g/dL                   04.5-40.9   RDW                       14.1 %                      11.5-14.6   Platelet Count            246.0 K/uL                  150.0-400.0   Neutrophil %              53.0 %                      43.0-77.0   Lymphocyte %  37.8 %                      12.0-46.0   Monocyte %                6.5 %                       3.0-12.0   Eosinophils%              2.0 %                       0.0-5.0   Basophils %               0.7 %                       0.0-3.0   Neutrophill Absolute      2.7 K/uL                    1.4-7.7   Lymphocyte Absolute       1.9 K/uL                    0.7-4.0   Monocyte Absolute         0.3 K/uL                    0.1-1.0  Eosinophils, Absolute                             0.1 K/uL                    0.0-0.7   Basophils Absolute        0.0 K/uL                    0.0-0.1  Impression & Recommendations:  Problem # 1:  ECCHYMOSES (ICD-782.9) Probably from taking asa. See instructions for specific recommendations   Problem # 2:  OSTEOPENIA (ICD-733.90) Bone density reviewed with pt, no wnl  Problem # 3:  HYPERLIPIDEMIA (ICD-272.4)  The following medications were removed from the medication list:    Simvastatin 40 Mg Tabs (Simvastatin) ..... One half each evening  Labs  Reviewed: SGOT: 17 (12/24/2009)   SGPT: 18 (12/24/2009)   HDL:67.40 (12/24/2009), 74.30 (02/14/2009)  LDL:DEL (08/16/2008), DEL (05/31/2008)  Chol:234 (12/24/2009), 250 (02/14/2009)  Trig:72.0 (12/24/2009), 72.0 (02/14/2009)  Target ldl is < 160 and should be able to do that with diet and ex and see if muscle aches resolve off statins  Orders: Est. Patient Level IV (21308)  Other Orders: TLB-CBC Platelet - w/Differential (85025-CBCD)  Patient Instructions: 1)  Hold aspirin whenever you note bleeding or bruising  2)  Stop simvastatin unitil 6 weeks 3)  Please schedule a follow-up appointment in 6 weeks, sooner if needed fasting for repeat cholesterol

## 2010-10-06 NOTE — Assessment & Plan Note (Signed)
Summary: Primary svc/ fu chol   Visit Type:  Follow-up PCP:  Jaycey Gens  Chief Complaint:  3 month followup.  Pt c/o cramping in legs since she started taking simvastatin.  She states she only takes med "every once in a while"..  History of Present Illness: 20 ybf remote minimal smoking hx with seasonal rhiinits flare since 2/09 esp in am, previously evaluated by Dr. Maple Hudson and on  Sudafed p.r.n. with some improvement but not eradication especially in the mornings.  She denies any associated wheezing itching sneezing or sinus pain appearance regions, better overall since on shots May 08  The main reason for visit today is to follow up long-term problems with obesity complicated by hyperlipidemia and borderline diabetes and hypertension.  She has never been under treatment for either diabetes or hypertension to date other than what diet and lifestyle modification, which she finds difficult, especially over the winter.  However, she denies any exertional chest pain orthopnea PND TIA or claudication symptoms at present.  She is now beginning to lose wt volutarily, arthriitis ok on as needed aleve working fine  6/22 change from vytorin to generic zocor  May 31, 2008 ov: reports stopped the Zocor due to muscle aches although the amount of statin was the same as and her Vytorin.  She denies any exertional chest pain TIA or claudication symptoms.       Updated Prior Medication List: ADULT ASPIRIN LOW STRENGTH 81 MG  TBDP (ASPIRIN) Take 1 tablet by mouth once a day CITRUCEL FIBERSHAKE   POWD (METHYLCELLULOSE (LAXATIVE))  CENTRUM SILVER   TABS (MULTIPLE VITAMINS-MINERALS) once daily TUMS 500 MG  CHEW (CALCIUM CARBONATE ANTACID) once daily SIMVASTATIN 40 MG  TABS (SIMVASTATIN) one each evening ALEVE 220 MG  TABS (NAPROXEN SODIUM) take as needed for back or joint pain ZYRTEC ALLERGY 10 MG  TABS (CETIRIZINE HCL) Take 1 tab by mouth at bedtime as needed for itchy sneezy runny nose SUDAFED PE MAXIMUM  STRENGTH 10 MG  TABS (PHENYLEPHRINE HCL) as needed  Current Allergies (reviewed today): No known allergies   Past Medical History:    ARTHRITIS (ICD-716.90)    Right clavicular head swelling 36644......................................................................Marland KitchenDr Benjaman Lobe    RHINITIS, CHRONIC (ICD-472.0)...............................................................................Marland KitchenDr Linward Foster        - on Shots since 5/08    HYPERLIPIDEMIA (ICD-272.4)       -target LDL less than 160    MORBID OBESITY (ICD-278.01)        -  ideal 142  Target wt  = 158   for BMI < 30     COLON POLYPS       -  colonoscopy 06/24/2006 with no adenomatous change, hyperplasia only    Health Maintenance       - CPX 3/09       - Td 1/05       - Pneumvax 2000        Family History:    Reviewed history from 04/19/2008 and no changes required:       Family History Colon Cancer in mother (in computer for recall colonoscopy 10/12)       she is an only child and knows nothing about her father's health                     Mother- deceased age 47; cancer       Father- no information   Social History:    Reviewed history from 04/19/2008 and no changes required:  Patient states former smoker quit as teenager       Negative history of passive tobacco smoke exposure.        Exercise-2 times weekly       Caffeine-2 cups daily       Marrie dwith 4 children     Vital Signs:  Patient Profile:   77 Years Old Female Height:     62.5 inches Weight:      168 pounds O2 Sat:      100 % O2 treatment:    Room Air Temp:     98.0 degrees F oral Pulse rate:   78 / minute BP sitting:   142 / 76  (left arm)  Vitals Entered By: Vernie Murders (May 31, 2008 10:02 AM)                 Physical Exam  wt 167 > 168   May 31, 2008  Ambulatory healthy appearing in no acute distress. Afeb with normal vital signs HEENT: nl dentition, turbinates, and orophanx. Nl external ear canals  without cough reflex Neck without JVD/Nodes/TM Lungs clear to A and P bilaterally without cough on insp or exp maneuvers RRR no s3 or murmur or increase in P2 Abd soft and benign with nl excursion in the supine position. No bruits or organomegaly Ext warm without calf tenderness, cyanosis clubbing or edema Skin warm and dry without lesions        Impression & Recommendations:  Problem # 1:  HYPERLIPIDEMIA (ICD-272.4) LDL now 150 off rx x one week, not tolerating 40 mg a day simvastatin with target LDL less than 160 which might be just as easily achieved by getting her weight down below a BMI of 30 - for now tryone half dose to see initial tolerated.  Her updated medication list for this problem includes:    Simvastatin 40 Mg Tabs (Simvastatin) ..... One half  each evening  Labs Reviewed: on vytorin 10/40 Chol: 206 (11/23/2007)   HDL: 69.9 (11/23/2007)   LDL: DEL (11/23/2007)   TG: 65 (11/23/2007) SGOT: 18 (11/23/2007)   SGPT: 20 (11/23/2007)   Problem # 2:  ARTHRITIS (ICD-716.90) explained that as long as Aleve is being used p.r.n. with meals and is adequate then there was nothing more that needs to be done  Problem # 3:  MORBID OBESITY (ICD-278.01)  Weight control is a matter of calorie balance which needs to be tilted in the pt's favor by eating less and exercising more.  Specifically, I recommended  exercise at a level where pt  is short of breath but not out of breath 30 minutes daily.  If not losing weight on this program, I would strongly recommend pt see a nutritionist with a food diary recorded for two weeks prior to the visit.     Complete Medication List: 1)  Adult Aspirin Low Strength 81 Mg Tbdp (Aspirin) .... Take 1 tablet by mouth once a day 2)  Citrucel Fibershake Powd (Methylcellulose (laxative)) 3)  Centrum Silver Tabs (Multiple vitamins-minerals) .... Once daily 4)  Tums 500 Mg Chew (Calcium carbonate antacid) .... Once daily 5)  Simvastatin 40 Mg Tabs  (Simvastatin) .... One each evening 6)  Aleve 220 Mg Tabs (Naproxen sodium) .... Take as needed for back or joint pain 7)  Zyrtec Allergy 10 Mg Tabs (Cetirizine hcl) .... Take 1 tab by mouth at bedtime as needed for itchy sneezy runny nose 8)  Sudafed Pe Maximum Strength 10 Mg Tabs (Phenylephrine hcl) .Marland KitchenMarland KitchenMarland Kitchen  As needed  Other Orders: Admin 1st Vaccine (16109) Flu Vaccine 25yrs + [phone])   Patient Instructions: 1)  Try back on one half pill of the simvastatin and see if muscle aches return, if so, stop it. 2)  Please schedule a follow-up appointment in 3 months. Flu Vaccine Consent Questions     Do you have a history of severe allergic reactions to this vaccine? no    Any prior history of allergic reactions to egg and/or gelatin? no    Do you have a sensitivity to the preservative Thimersol? no    Do you have a past history of Guillan-Barre Syndrome? no    Do you currently have an acute febrile illness? no    Have you ever had a severe reaction to latex? no    Vaccine information given and explained to patient? yes    Are you currently pregnant? no    Lot Number:[id number]   Site Given  Left Deltoid IM Vernie Murders  May 31, 2008 11:09 AM        ]

## 2010-10-06 NOTE — Assessment & Plan Note (Signed)
Summary: Primary svc/ fu hyperlipidemia   Primary Provider/Referring Provider:  Sherene Sires  CC:  2 month followup.  Pt c/o sinus congestion x 2 months.  .  History of Present Illness: 77 ybf remote minimal smoking hx with seasonal rhiinits flares  previously evaluated by Dr. Maple Hudson and on  Sudafed p.r.n. with some improvement but not eradication especially in the mornings.  She denies any associated wheezing itching sneezing or sinus pain appearance regions, better overall since on shots May 08   02/26/08 ov:  change from vytorin to generic zocor  May 31, 2008 ov: stopped the Zocor due to muscle aches although the amount of statin was the same as and her Vytorin so rechallenged with 20 mg per day  December 13, 2008 cpx acutely worse cough, mostly dry,  at onset pollen seasons 2 weeks prior to ov with eyes itching and pnds no nocturnal exam without sorethroat or discolored mucus, sinus ha.  RESOLVED  February 14, 2009 ov f/u lipid profile, no new symptoms, denies tia or claudication, cp.    Current Medications (verified): 1)  Adult Aspirin Low Strength 81 Mg  Tbdp (Aspirin) .... Take 1 Tablet By Mouth Once A Day 2)  Citrucel Fibershake   Powd (Methylcellulose (Laxative)) 3)  Centrum Silver   Tabs (Multiple Vitamins-Minerals) .... Once Daily 4)  Tums 500 Mg  Chew (Calcium Carbonate Antacid) .... Once Daily 5)  Simvastatin 40 Mg  Tabs (Simvastatin) .... One Half Each Evening 6)  Aleve 220 Mg  Tabs (Naproxen Sodium) .... Take As Needed For Back or Joint Pain 7)  Zyrtec Allergy 10 Mg  Tabs (Cetirizine Hcl) .... Take 1 Tab By Mouth At Bedtime As Needed For Itchy Sneezy Runny Nose 8)  Sudafed Pe Maximum Strength 10 Mg  Tabs (Phenylephrine Hcl) .... As Needed 9)  Allergy Shots .... Once A Week  Allergies (verified): No Known Drug Allergies  Past History:  Past Medical History: Reviewed history from 12/13/2008 and no changes required. ARTHRITIS (ICD-716.90) Right clavicular head swelling  [id number]......................................................................Marland KitchenDr Noel Gerold RHINITIS, CHRONIC (ICD-472.0)...............................................................................Marland KitchenDr Linward Foster     - on immunotherapy since 5/08 HYPERLIPIDEMIA (ICD-272.4)    -target LDL less than 160 MORBID OBESITY (ICD-278.01)     -  ideal 142  Target wt  = 158   for BMI < 30  COLON POLYPS    -  colonoscopy 06/24/2006 with no adenomatous change, hyperplasia only OSTEOPENIA     -BMD  08/07/08  tspine -1.2,  Left femur -.4,  right femur +.2 Health Maintenance    - CPX December 13, 2008     - Td 1/05    - Pneumvax 2000   -  GYN [name] ([place])    Vital Signs:  Patient profile:   77 year old female Weight:      169 pounds O2 Sat:      97 % on Room air Temp:     98.1 degrees F oral Pulse rate:   84 / minute BP sitting:   130 / 84  (left arm)  Vitals Entered By: Vernie Murders (February 14, 2009 10:01 AM)  O2 Flow:  Room air  Physical Exam  Additional Exam:  wt  172 August 16, 2008 >  173 December 13, 2008 > 169 February 14, 2009  Ambulatory healthy appearing in no acute distress. Afeb with normal vital signs HEENT: nl dentition, turbinates, and orophanx. Nl external ear canals without cough reflex Neck without JVD/Nodes/TM Lungs clear to A and P  bilaterally without cough on insp or exp maneuvers RRR no s3 or murmur or increase in P2.  no edema Abd soft and benign with nl excursion in the supine position. No bruits or organomegaly Ext warm without calf tenderness, cyanosis clubbing     Impression & Recommendations:  Problem # 1:  HYPERLIPIDEMIA (ICD-272.4) LDL 136  >  162 February 14, 2009  with target < 160 reasonable based on absence of risk factors  Her updated medication list for this problem includes:    Simvastatin 40 Mg Tabs (Simvastatin) ..... One half each evening  Labs Reviewed: SGOT: 18 (12/13/2008)   SGPT: 19 (12/13/2008)   HDL:78.40 (12/13/2008), 76.7 (08/16/2008)   LDL:DEL (08/16/2008), DEL (05/31/2008)  Chol:239 (12/13/2008), 217 (08/16/2008)  Trig:72.0 (12/13/2008), 67 (08/16/2008)  Problem # 2:  RHINITIS, CHRONIC (ICD-472.0)  Each maintenance medication was reviewed in detail including most importantly the difference between maintenance and as needed and under what circumstances the prns are to be used.  See instructions for specific recommendations   Problem # 3:  MORBID OBESITY (ICD-278.01) Making slow progress toward goal of < 158 to get BMI < 30  Problem # 4:  ARTHRITIS (ICD-716.90) reviewed approp rx with alleve  Other Orders: TLB-CRP-Full Range (C-Reactive Protein) (86140-FCRP) TLB-Lipid Panel (80061-LIPID) TLB-Hepatic/Liver Function Pnl (80076-HEPATIC) Est. Patient Level III (16109)  Patient Instructions: 1)  we will call you with your lab reports 2)  take aleve with meals per bottle as needed 3)  Please schedule a follow-up appointment in 6 months.

## 2010-10-06 NOTE — Assessment & Plan Note (Signed)
Summary: rov 3 months////kp   Visit Type:  Follow-up PCP:  [name]  Chief Complaint:  3 month followup.  Pt has no new complaints today.  No change in arthritis pain.[name]  History of Present Illness: 77 ybf remote minimal smoking hx with seasonal rhiinits flare since 2/09 esp in am, previously evaluated by Dr. Maple Hudson and on a combination of Zyrtec at bedtime plus Sudafed p.r.n. with some improvement but not eradication especially in the mornings.  She denies any associated wheezing itching sneezing or sinus pain appearance regions, better overall since on shots.  The main reason for visit today is to follow up long-term problems with obesity complicated by hyperlipidemia and borderline diabetes and hypertension.  She has never been under treatment for either diabetes or hypertension to date other than what diet and lifestyle modification, which he finds difficult, especially over the winter.  However, she denies any exertional chest pain orthopnea PND TIA or claudication symptoms at present.  She is now beginning to lose wt volutarily, arthriitis ok on as needed aleve.  6/22 Current Meds:  ADULT ASPIRIN LOW STRENGTH 81 MG  TBDP (ASPIRIN) Take 1 tablet by mouth once a day VYTORIN 10-40 MG  TABS (EZETIMIBE-SIMVASTATIN) Take 1 tab by mouth at bedtime ZYRTEC ALLERGY 10 MG  TABS (CETIRIZINE HCL) Take 1 tab by mouth at bedtime as needed CITRUCEL FIBERSHAKE   POWD (METHYLCELLULOSE (LAXATIVE))  CENTRUM SILVER   TABS (MULTIPLE VITAMINS-MINERALS) once daily TUMS 500 MG  CHEW (CALCIUM CARBONATE ANTACID) once daily ALEVE 220 MG  TABS (NAPROXEN SODIUM) as needed       Current Allergies: No known allergies   Past Medical History:    Current Problems:     ARTHRITIS (ICD-716.90)    RHINITIS, CHRONIC (ICD-472.0)    HYPERLIPIDEMIA (ICD-272.4) target LDL less than 160    MORBID OBESITY (ICD-278.01)  ideal 142  target <158     colon polyps, see last colonoscopy 06/24/2006 with no adenomatous change,  hyperplasia only        Family History:    Family History Colon Cancer in mother (in computer for recall colonoscopy 10/12)    she is an only child and knows nothing about her father's health  Social History:    Reviewed history from 11/23/2007 and no changes required:       Patient states former smoker quit as teenager    Review of Systems       GYN care per Dr. Allena Katz in [facility]   Vital Signs:  Patient Profile:   77 Years Old Female Height:     62.5 inches Weight:      166.50 pounds O2 Sat:      97 % O2 treatment:    Room Air Temp:     98.3 degrees F oral Pulse rate:   64 / minute BP sitting:   124 / 78  (left arm)  Vitals Entered By: Vernie Murders (February 26, 2008 9:06 AM)                 Physical Exam  wt down 170 from 3/09 to 166 Afeb with normal vital signs HEENT: nl dentition, turbinates, and orophanx. Nl external ear canals without cough reflex Neck without JVD/Nodes/TM Lungs clear to A and P bilaterally without cough on insp or exp maneuvers RRR no s3 or murmur or increase in P2 Abd soft and benign with nl excursion in the supine position. No bruits or organomegaly Ext warm without calf tenderness, cyanosis clubbing or  edema Skin warm and dry without lesions       Impression & Recommendations:  Problem # 1:  HYPERLIPIDEMIA (ICD-272.4)  The following medications were removed from the medication list:    Vytorin 10-40 Mg Tabs (Ezetimibe-simvastatin) .[name]... Take 1 tab by mouth at bedtime  Her updated medication list for this problem includes:    Simvastatin 40 Mg Tabs (Simvastatin) ..... One each evening  with weight loss down toward a target of 158 pounds she should not need combination therapy but should be able to reach her goal LDL on simvastatin 40 mg one daily.  I've advised her to have this rechecked in 3 months.  Orders: Est. Patient Level III (16109)   Problem # 2:  MORBID OBESITY (ICD-278.01) progressing toward goal by doing better  calorie balance  positive reinforcement today Orders: Est. Patient Level III (60454)   Medications Added to Medication List This Visit: 1)  Simvastatin 40 Mg Tabs (Simvastatin) .... One each evening 2)  Aleve 220 Mg Tabs (Naproxen sodium) .... As needed 3)  Aleve 220 Mg Tabs (Naproxen sodium) .... Take as needed for back or joint pain 4)  Zyrtec Allergy 10 Mg Tabs (Cetirizine hcl) .... Take 1 tab by mouth at bedtime as needed for itchy sneezy runny nose   Patient Instructions: 1)  Please schedule a follow-up appointment in 3 months for fasting blood work to recheck your cholesterol 2)  Keep up the good work on your weight with target less than 158   Prescriptions: SIMVASTATIN 40 MG  TABS (SIMVASTATIN) one each evening  #30 x 11   Entered and Authorized by:   Nyoka Cowden MD   Signed by:   Nyoka Cowden MD on 02/26/2008   Method used:   Electronically sent to ...       [id number]       [address]       [place], [place]  [address]       Ph: [phone] or [phone]       Fax: [phone]   RxID:   [id number]  ]

## 2010-10-06 NOTE — Miscellaneous (Signed)
Summary: Rx/Tree mix,[place] dust/[facility]  Rx/Tree mix,[place] dust/[facility]   Imported By: Lester Roscoe 06/11/2008 10:07:19  _____________________________________________________________________  External Attachment:    Type:   Image     Comment:   External Document

## 2010-10-06 NOTE — Assessment & Plan Note (Signed)
Summary: Primary svc/ f/u multiple problems   Primary Provider/Referring Provider:  Sherene Sires  CC:  Nasal congestion- some better.  History of Present Illness: 2 ybf remote minimal smoking hx with seasonal rhiinits flares  better overall since started on immunotherapy  May2008   02/26/08 ov:  change from vytorin to generic zocor  May 31, 2008 ov: stopped the Zocor due to muscle aches although the amount of statin was the same as her Vytorin so rechallenged with 20 mg per day  February 14, 2009 ov f/u lipid profile, no new symptoms, denies tia or claudication, cp. tolerating zocor, no change in rx.    December 24, 2009 sick x 2 weeks itching, sneezing runny watery no purulent secretions, no sob. already tried zyrtec and sudafed.  rec pred dose pack  March 18, 2010 ov c/o unexplained bruise on her rt hand x one week- not painful.  She also c/o cramping in her hands and feet x several months- she questions if this is from taking statin. no injury, no other bruising/bleeding. rec try off statin.  decided to stop all meds  April 30, 2010 Followup.  Pt c/o nasal congestion and scratchy throat x 2 wks- she gets allergy inj every wk otherwise not consistently taking any meds. rec Resume baby aspirin and if you notice excess bruising or bleeding try 3 x weekly M W F Resume simvastatin at one  half pill daily if tolerated > did fine  July 15, 2010  ov cc Nasal congestion- some better, right knee variably hurting x 3 months, minimal swelling, not sure how to use aleve but when he takes it works well.  Pt denies any significant sore throat, dysphagia, itching, sneezing,    fever, chills, sweats, unintended wt loss, pleuritic or exertional cp, hempoptysis, change in activity tolerance  orthopnea pnd or leg swelling.  Pt also denies any obvious fluctuation in symptoms with weather or environmental change or other alleviating or aggravating factors.       Current Medications (verified): 1)  Allergy  Vaccine 1:10 Go (W-E) .... Once A Week 2)  Zyrtec Allergy 10 Mg  Tabs (Cetirizine Hcl) .... Take 1 Tab By Mouth At Bedtime As Needed For Itchy Sneezy Runny Nose 3)  Sudafed Pe Maximum Strength 10 Mg  Tabs (Phenylephrine Hcl) .... As Needed 4)  Epipen 0.3 Mg/0.16ml Devi (Epinephrine) .... For Severe Allergic Reaction 5)  Simvastatin 40 Mg Tabs (Simvastatin) .... 1/2 Once Daily  Allergies (verified): No Known Drug Allergies  Past History:  Past Medical History: ARTHRITIS (ICD-716.90) Right clavicular head swelling 16109......................................................................Marland KitchenDr Noel Gerold RHINITIS, CHRONIC (ICD-472.0)...............................................................................Marland KitchenDr Linward Foster     - on immunotherapy since 5/08 HYPERLIPIDEMIA (ICD-272.4)    -target LDL less than 160 > try off due to musle aches March 18, 2010  MORBID OBESITY (ICD-278.01)     -  ideal   142  Target wt  = 158   for BMI < 30  COLON POLYPS    -  colonoscopy 06/24/2006 with no adenomatous change, hyperplasia only OSTEOPENIA     -BMD  08/07/08  tspine -1.2,  Left femur -.4,  right femur +.2     - Repeat 01/02/2010       1.9                    0.1                     0.5 Health Maintenance.......................................................Marland KitchenWert    - CPX December 24, 2009     - Td 1/05    - Pneumvax 2000 age 5   -  GYN [name] ([facility])    Vital Signs:  Patient profile:   77 year old female Weight:      172 pounds O2 Sat:      99 % on Room air Temp:     97.7 degrees F oral Pulse rate:   71 / minute BP sitting:   126 / 74  (left arm) Cuff size:   large  Vitals Entered ByVernie Murders (July 15, 2010 10:02 AM)  O2 Flow:  Room air  Physical Exam  Additional Exam:  wt  172 August 16, 2008  > 166 March 18, 2010 > 168 April 30, 2010 > 172 July 15, 2010  Ambulatory healthy appearing in no acute distress. Skin: no rash or excoriation Afeb with normal vital  signs HEENT full: dentures, nasal mucosa clear with minor mucus bridging, no polyps. Nl external ear canals  Neck without JVD/Nodes/TM Lungs clear to A and P bilaterally without cough on insp or exp maneuver RRR no s3 or murmur or increase in P2.  no edema Abd soft and benign  Ext warm without calf tenderness, cyanosis clubbing - echymoses resolved Skin no other bruises or petchiae   Sodium                    143 mEq/L                   135-145   Potassium                 4.4 mEq/L                   3.5-5.1   Chloride                  108 mEq/L                   96-112   Carbon Dioxide            28 mEq/L                    19-32   Glucose              [H]  103 mg/dL                   44-03   BUN                       19 mg/dL                    4-74   Creatinine                1.0 mg/dL                   2.5-9.5   Calcium                   9.7 mg/dL                   6.3-87.5   GFR                       72.37 mL/min                >60  Tests: (2) Lipid Panel (  LIPID)   Cholesterol          [H]  234 mg/dL                   1-610     ATP III Classification            Desirable:  < 200 mg/dL                    Borderline High:  200 - 239 mg/dL               High:  > = 240 mg/dL   Triglycerides             72.0 mg/dL                  9.6-045.4     Normal:  <150 mg/dL     Borderline High:  098 - 199 mg/dL   HDL                       11.91 mg/dL                 >47.82   VLDL Cholesterol          14.4 mg/dL                  9.5-62.1  CHO/HDL Ratio:  CHD Risk                             3                    Men          Women     1/2 Average Risk     3.4          3.3     Average Risk          5.0          4.4     2X Average Risk          9.6          7.1     3X Average Risk          15.0          11.0                           Tests: (3) Cholesterol LDL - Direct (DIRLDL)  Cholesterol LDL - Direct                             140.9 mg/dL  Impression & Recommendations:  Problem # 1:   RHINITIS, CHRONIC (ICD-472.0)  rx reviewed  Orders: Est. Patient Level IV (30865)  Problem # 2:  ECCHYMOSES (ICD-782.9)  resolved, tol asa daily  Orders: Est. Patient Level IV (78469)  Problem # 3:  HYPERLIPIDEMIA (ICD-272.4)   -target LDL less than 160  Her updated medication list for this problem includes:    Simvastatin 40 Mg Tabs (Simvastatin) .Marland Kitchen... 1/2 once daily     Labs Reviewed: SGOT: 17 (12/24/2009)   SGPT: 18 (12/24/2009)   HDL:80.50 (04/30/2010), 67.40 (12/24/2009)  LD 162 L:  vs LDL   July 15, 2010 = 629 ok, tol 20 mg per day   Chol:301 (04/30/2010), 234 (12/24/2009)  Trig:57.0 (04/30/2010), 72.0 (12/24/2009)  Problem # 4:  ARTHRITIS (ICD-716.90) aleve rx reviewed, f/u Cohen prn  Medications Added to Medication List This Visit: 1)  Aspirin 81 Mg Chew (Aspirin) .... One daily 2)  Aleve 220 Mg Tabs (Naproxen sodium) .... One twice daily with meals as needed  Other Orders: TLB-BMP (Basic Metabolic Panel-BMET) (80048-METABOL) TLB-Lipid Panel (80061-LIPID)  Patient Instructions: 1)  Return to office in 3 months, sooner if needed

## 2010-10-06 NOTE — Letter (Signed)
Summary: [facility]  [facility]   Imported By: Sherian Rein 10/24/2009 10:15:38  _____________________________________________________________________  External Attachment:    Type:   Image     Comment:   External Document

## 2010-10-06 NOTE — Miscellaneous (Signed)
Summary: [facility]  [facility]   Imported By: Lester Cando 06/11/2008 10:04:52  _____________________________________________________________________  External Attachment:    Type:   Image     Comment:   External Document

## 2010-10-06 NOTE — Assessment & Plan Note (Signed)
Summary: FLU SHOT/KL[facility]  Nurse Visit   Allergies: No Known Drug Allergies  Immunizations Administered:  Influenza Vaccine # 1:    Vaccine Type: Fluvax MCR    Site: left deltoid    Mfr: GlaxoSmithKline    Dose: 0.5 ml    Route: IM    Given by: Zackery Barefoot CMA    Exp. Date: 03/06/2011    Lot #: [id number]    VIS given: 03/31/10 version given May 21, 2010.  Flu Vaccine Consent Questions:    Do you have a history of severe allergic reactions to this vaccine? no    Any prior history of allergic reactions to egg and/or gelatin? no    Do you have a sensitivity to the preservative Thimersol? no    Do you have a past history of Guillan-Barre Syndrome? no    Do you currently have an acute febrile illness? no    Have you ever had a severe reaction to latex? no    Vaccine information given and explained to patient? yes    Are you currently pregnant? no  Orders Added: 1)  Influenza Vaccine MCR [00025]

## 2010-10-06 NOTE — Assessment & Plan Note (Signed)
Summary: Primary svc/ f/u hyperlipidemia/ bruising   Primary Provider/Referring Provider:  Sherene Sires  CC:  Followup.  Pt c/o nasal congestion and scratchy throat x 2 wks- she gets allergy inj every wk..  History of Present Illness: 82 ybf remote minimal smoking hx with seasonal rhiinits flares  better overall since started on immunotherapy  May2008   02/26/08 ov:  change from vytorin to generic zocor  May 31, 2008 ov: stopped the Zocor due to muscle aches although the amount of statin was the same as her Vytorin so rechallenged with 20 mg per day  February 14, 2009 ov f/u lipid profile, no new symptoms, denies tia or claudication, cp. tolerating zocor, no change in rx.    December 24, 2009 sick x 2 weeks itching, sneezing runny watery no purulent secretions, no sob. already tried zyrtec and sudafed.  rec pred dose pack  March 18, 2010 ov c/o unexplained bruise on her rt hand x one week- not painful.  She also c/o cramping in her hands and feet x several months- she questions if this is from taking statin. no injury, no other bruising/bleeding. rec try off statin.  decided to stop all meds  April 30, 2010 Followup.  Pt c/o nasal congestion and scratchy throat x 2 wks- she gets allergy inj every wk otherwise not consistently taking any meds.  Pt denies any significant sore throat, dysphagia, itching, sneezing,  nasal congestion or excess secretions,  fever, chills, sweats, unintended wt loss, pleuritic or exertional cp, hempoptysis, change in activity tolerance  orthopnea pnd or leg swelling   Current Medications (verified): 1)  Allergy Vaccine 1:10 Go (W-E) .... Once A Week 2)  Zyrtec Allergy 10 Mg  Tabs (Cetirizine Hcl) .... Take 1 Tab By Mouth At Bedtime As Needed For Itchy Sneezy Runny Nose 3)  Sudafed Pe Maximum Strength 10 Mg  Tabs (Phenylephrine Hcl) .... As Needed 4)  Epipen 0.3 Mg/0.27ml Devi (Epinephrine) .... For Severe Allergic Reaction  Allergies (verified): No Known Drug  Allergies  Past History:  Past Medical History: ARTHRITIS (ICD-716.90) Right clavicular head swelling 35009......................................................................Marland KitchenDr Noel Gerold RHINITIS, CHRONIC (ICD-472.0)...............................................................................Marland KitchenDr Linward Foster     - on immunotherapy since 5/08 HYPERLIPIDEMIA (ICD-272.4)    -target LDL less than 160 > try off due to musle aches March 18, 2010  MORBID OBESITY (ICD-278.01)     -  ideal   142  Target wt  = 158   for BMI < 30  COLON POLYPS    -  colonoscopy 06/24/2006 with no adenomatous change, hyperplasia only OSTEOPENIA     -BMD  08/07/08  tspine -1.2,  Left femur -.4,  right femur +.2     - Repeat 01/02/2010       1.9                    0.1                     0.5 Health Maintenance    - CPX December 24, 2009     - Td 1/05    - Pneumvax 2000 age 34   -  GYN [name] ([facility])    Vital Signs:  Patient profile:   77 year old female Weight:      168 pounds O2 Sat:      95 % on Room air Temp:     98.4 degrees F oral Pulse rate:   71 / minute BP sitting:   130 / 82  (  left arm)  Vitals Entered By: Vernie Murders (April 30, 2010 10:43 AM)  O2 Flow:  Room air  Physical Exam  Additional Exam:  wt  172 August 16, 2008 >  173 December 13, 2008 > 169 February 14, 2009 ;> 169 December 27, 2009  > 166 March 18, 2010 > 168 April 30, 2010  Ambulatory healthy appearing in no acute distress. Skin: no rash or excoriation Afeb with normal vital signs HEENT full: dentures, nasal mucosa clear with minor mucus bridging, no polyps. Nl external ear canals  Neck without JVD/Nodes/TM Lungs clear to A and P bilaterally without cough on insp or exp maneuver RRR no s3 or murmur or increase in P2.  no edema Abd soft and benign  Ext warm without calf tenderness, cyanosis clubbing - quarter sized ecchymosis right thumb base with from Skin no other bruises or petchiae Cholesterol          [H]  301 mg/dL                    5-366     ATP III Classification            Desirable:  < 200 mg/dL                    Borderline High:  200 - 239 mg/dL               High:  > = 240 mg/dL   Triglycerides             57.0 mg/dL                  4.4-034.7     Normal:  <150 mg/dL     Borderline High:  425 - 199 mg/dL   HDL                       95.63 mg/dL                 >87.56   VLDL Cholesterol          11.4 mg/dL                  4.3-32.9  CHO/HDL Ratio:  CHD Risk                             4                    Men          Women     1/2 Average Risk     3.4          3.3     Average Risk          5.0          4.4     2X Average Risk          9.6          7.1     3X Average Risk          15.0          11.0                           Tests: (2) Cholesterol LDL - Direct (DIRLDL)  Cholesterol LDL - Direct  194.2 mg/dL     Optimal:  <678 mg/dL     Near or Above Optimal:  100-129 mg/dL     Borderline High:  938-101 mg/dL     High:  751-025 mg/dL     Very High:  >852 mg/dL  Impression & Recommendations:  Problem # 1:  HYPERLIPIDEMIA (ICD-272.4) -target LDL less than 160 > try off due to musle aches July 13, 201  Rec try resume statin at one half previous dose x 3 month trial then recheck level, d/c it if symtpoms recur. Labs Reviewed: SGOT: 17 (12/24/2009)   SGPT: 18 (12/24/2009)   HDL:67.40 (12/24/2009), 74.30 (02/14/2009)  LDL:DEL (08/16/2008), DEL (05/31/2008)  Chol:234 (12/24/2009), 250 (02/14/2009)  Trig:72.0 (12/24/2009), 72.0 (02/14/2009)  Problem # 2:  ECCHYMOSES (ICD-782.9) ok to resume asa in minimal doses and observe for recurrent bruising.  Problem # 3:  OSTEOPENIA (ICD-733.90) check vit D  Other Orders: T-Vitamin D (25-Hydroxy) (77824-23536) TLB-Lipid Panel (80061-LIPID) Est. Patient Level III (14431)  Patient Instructions: 1)  Resume baby aspirin and if you notice excess bruising or bleeding try 3 x weekly M W F 2)  Return to office in 3 months, sooner if  needed  3)  Resume simvastatin at one  half pill daily if tolerated

## 2010-10-06 NOTE — Miscellaneous (Signed)
Summary: Injection Record/[facility]  Injection Record/[facility]   Imported By: Sherian Rein 01/08/2010 13:08:11  _____________________________________________________________________  External Attachment:    Type:   Image     Comment:   External Document

## 2010-10-06 NOTE — Assessment & Plan Note (Signed)
Summary: Primary svc/ cpx - vit d 15 and needs bone density   Primary Provider/Referring Provider:  Sherene Sires  CC:  cpx fasting.  History of Present Illness: 77 ybf remote minimal smoking hx with seasonal rhiinits flares  better overall since started on immunotherapy  May2008   02/26/08 ov:  change from vytorin to generic zocor  May 31, 2008 ov: stopped the Zocor due to muscle aches although the amount of statin was the same as her Vytorin so rechallenged with 20 mg per day  February 14, 2009 ov f/u lipid profile, no new symptoms, denies tia or claudication, cp. tolerating zocor, no change in rx.    December 24, 2009 sick x 2 weeks itching, sneezing runny watery no purulent secretions, no sob. already tried zyrtec and sudafed.  Pt denies any significant sore throat, dysphagia, itching,,  fever, chills, sweats, unintended wt loss, pleuritic or exertional cp, hempoptysis, change in activity tolerance  orthopnea pnd or leg swelling.  Pt also denies any obvious fluctuation in symptoms with weather or environmental change or other alleviating or aggravating factors.        Current Medications (verified): 1)  Adult Aspirin Low Strength 81 Mg  Tbdp (Aspirin) .... Take 1 Tablet By Mouth Once A Day 2)  Citrucel  Powd (Methylcellulose (Laxative)) .Marland Kitchen.. 1 Tsp Two Times A Day As Needed 3)  Centrum Silver   Tabs (Multiple Vitamins-Minerals) .... Once Daily 4)  Tums 500 Mg  Chew (Calcium Carbonate Antacid) .... Once Daily 5)  Simvastatin 40 Mg  Tabs (Simvastatin) .... One Half Each Evening 6)  Aleve 220 Mg  Tabs (Naproxen Sodium) .... Take As Needed For Back or Joint Pain 7)  Zyrtec Allergy 10 Mg  Tabs (Cetirizine Hcl) .... Take 1 Tab By Mouth At Bedtime As Needed For Itchy Sneezy Runny Nose 8)  Sudafed Pe Maximum Strength 10 Mg  Tabs (Phenylephrine Hcl) .... As Needed 9)  Allergy Vaccine 1:10 Go (W-E) .... Once A Week 10)  Epipen 0.3 Mg/0.75ml Devi (Epinephrine) .... For Severe Allergic  Reaction  Allergies (verified): No Known Drug Allergies  Past History:  Past Medical History: ARTHRITIS (ICD-716.90) Right clavicular head swelling 16109......................................................................Marland KitchenDr Noel Gerold RHINITIS, CHRONIC (ICD-472.0)...............................................................................Marland KitchenDr Linward Foster     - on immunotherapy since 5/08 HYPERLIPIDEMIA (ICD-272.4)    -target LDL less than 160 MORBID OBESITY (ICD-278.01)     -  ideal 142  Target wt  = 158   for BMI < 30  COLON POLYPS    -  colonoscopy 06/24/2006 with no adenomatous change, hyperplasia only OSTEOPENIA     -BMD  08/07/08  tspine -1.2,  Left femur -.4,  right femur +.2 Health Maintenance    - CPX December 24, 2009     - Td 1/05    - Pneumvax 2000 age 77   -  GYN [name] ([place])    Family History: Reviewed history from 08/16/2008 and no changes required. Family History Colon Cancer in mother (in computer for recall colonoscopy 10/12) she is an only child and knows nothing about her father's health  Social History: Reviewed history from 04/19/2008 and no changes required. Patient states former smoker quit as teenager Negative history of passive tobacco smoke exposure.  Exercise-2 times weekly Caffeine-2 cups daily Marrie dwith 4 children  Vital Signs:  Patient profile:   77 year old female Height:      62 inches Weight:      169 pounds BMI:     31.02 O2 Sat:  98 % on Room air Temp:     98.3 degrees F oral Pulse rate:   75 / minute BP sitting:   152 / 84  (left arm)  Vitals Entered By: Vernie Murders (December 24, 2009 8:44 AM)  O2 Flow:  Room air  Physical Exam  Additional Exam:  wt  172 August 16, 2008 >  173 December 13, 2008 > 169 February 14, 2009 ;> 167  May 14, 2009 > 169 December 27, 2009  Ambulatory healthy appearing in no acute distress. Skin: no rash or excoriation Afeb with normal vital signs HEENT full: dentures, nasal mucosa clear  with minor mucus bridging, no polyps. Nl external ear canals  Neck without JVD/Nodes/TM Lungs clear to A and P bilaterally without cough on insp or exp maneuvers RRR no s3 or murmur or increase in P2.  no edema Abd soft and benign  Ext warm without calf tenderness, cyanosis clubbing  Neuro- grossly intact to observation/ nl sensorium and gait.  Cholesterol          [H]  234 mg/dL                   4-034     ATP III Classification            Desirable:  < 200 mg/dL                    Borderline High:  200 - 239 mg/dL               High:  > = 240 mg/dL   Triglycerides             72.0 mg/dL                  7.4-259.5     Normal:  <150 mg/dL     Borderline High:  638 - 199 mg/dL   HDL                       75.64 mg/dL                 >33.29   VLDL Cholesterol          14.4 mg/dL                  5.1-88.4  CHO/HDL Ratio:  CHD Risk                             3                    Men          Women     1/2 Average Risk     3.4          3.3     Average Risk          5.0          4.4     2X Average Risk          9.6          7.1     3X Average Risk          15.0          11.0                           Tests: (  2) BMP (METABOL)   Sodium                    145 mEq/L                   135-145   Potassium                 4.3 mEq/L                   3.5-5.1   Chloride                  108 mEq/L                   96-112   Carbon Dioxide            30 mEq/L                    19-32   Glucose              [H]  105 mg/dL                   57-84   BUN                       16 mg/dL                    6-96   Creatinine                0.9 mg/dL                   2.9-5.2   Calcium                   9.7 mg/dL                   8.4-13.2   GFR                       78.08 mL/min                >60  Tests: (3) CBC Platelet w/Diff (CBCD)   White Cell Count          6.7 K/uL                    4.5-10.5   Red Cell Count       [L]  3.78 Mil/uL                 3.87-5.11   Hemoglobin                12.2  g/dL                   44.0-10.2   Hematocrit                36.2 %                      36.0-46.0   MCV                       96.0 fl                     78.0-100.0   MCHC  33.7 g/dL                   16.1-09.6   RDW                       14.0 %                      11.5-14.6   Platelet Count            297.0 K/uL                  150.0-400.0   Neutrophil %              57.1 %                      43.0-77.0   Lymphocyte %              31.8 %                      12.0-46.0   Monocyte %                7.5 %                       3.0-12.0   Eosinophils%              3.1 %                       0.0-5.0   Basophils %               0.5 %                       0.0-3.0   Neutrophill Absolute      3.8 K/uL                    1.4-7.7   Lymphocyte Absolute       2.1 K/uL                    0.7-4.0   Monocyte Absolute         0.5 K/uL                    0.1-1.0  Eosinophils, Absolute                             0.2 K/uL                    0.0-0.7   Basophils Absolute        0.0 K/uL                    0.0-0.1  Tests: (4) Hepatic/Liver Function Panel (HEPATIC)   Total Bilirubin           0.5 mg/dL                   0.4-5.4   Direct Bilirubin          0.1 mg/dL                   0.9-8.1   Alkaline Phosphatase      63 U/L  39-117   AST                       17 U/L                      0-37   ALT                       18 U/L                      0-35   Total Protein             6.6 g/dL                    1.6-1.0   Albumin                   3.9 g/dL                    9.6-0.4  Tests: (5) TSH (TSH)   FastTSH                   2.25 uIU/mL                 0.35-5.50  Tests: (6) UDip w/Micro (URINE)   Color                     LT. YELLOW       RANGE:  Yellow;Lt. Yellow   Clarity                   CLEAR                       Clear   Specific Gravity          1.020                       1.000 - 1.030   Urine Ph                  7.0                          5.0-8.0   Protein                   NEGATIVE                    Negative   Urine Glucose             NEGATIVE                    Negative   Ketones                   NEGATIVE                    Negative   Urine Bilirubin           NEGATIVE                    Negative   Blood                     NEGATIVE                    Negative  Urobilinogen              0.2                         0.0 - 1.0   Leukocyte Esterace        SMALL                       Negative   Nitrite                   NEGATIVE                    Negative   Urine WBC                 3-6/hpf                     0-2/hpf   Urine Epith               Rare(0-4/hpf)               Rare(0-4/hpf)   Urine Bacteria            Many(>50/hpf)               None  Tests: (7) Cholesterol LDL - Direct (DIRLDL)  Cholesterol LDL - Direct                             146.1 mg/dL  CXR  Procedure date:  12/24/2009  Findings:       Comparison: 12/13/2008   Findings: Trachea is midline.  Heart size stable.  Thoracic aorta is calcified.  Biapical pleural thickening.  Left lobe scarring. Lungs are otherwise clear.  No pleural fluid.   IMPRESSION: No acute findings.  Impression & Recommendations:  Problem # 1:  RHINITIS, CHRONIC (ICD-472.0) Seasonal flare not responding to otcs so rx prednisone  Problem # 2:  HYPERLIPIDEMIA (ICD-272.4)  Her updated medication list for this problem includes:    Simvastatin 40 Mg Tabs (Simvastatin) ..... One half each evening  Labs Reviewed: SGOT: 18 (02/14/2009)   SGPT: 18 (02/14/2009)   HDL:74.30 (02/14/2009), 78.40 (12/13/2008)  LDL:DEL (08/16/2008)  > 147 December 24, 2009  ), DEL (05/31/2008)  Chol:250 (02/14/2009), 239 (12/13/2008)  Trig:72.0 (02/14/2009), 72.0 (12/13/2008)  Problem # 3:  MORBID OBESITY (ICD-278.01) Target wt 158 to get BMI < 30  Weight control is a matter of calorie balance which needs to be tilted in the pt's favor by eating less and exercising more.  Specifically, I  recommended  exercise at a level where pt  is short of breath but not out of breath 30 minutes daily.  If not losing weight on this program, I would strongly recommend pt see a nutritionist with a food diary recorded for two weeks prior to the visit.     Problem # 4:  OSTEOPENIA (ICD-733.90) Vit d def  Call report:  Vit D def, rx with 50 k twice weekly for 12 weeks, then recheck level   Needs bone density now  Medications Added to Medication List This Visit: 1)  Citrucel Powd (Methylcellulose (laxative)) .Marland Kitchen.. 1 tsp two times a day as needed 2)  Prednisone 10 Mg Tabs (Prednisone) .... 4 each am x 2days, 2x2days, 1x2days and stop  Other Orders: EKG w/ Interpretation (93000) Est. Patient 65& > (16109) T-Vitamin D (25-Hydroxy) (60454-09811)  T-2 View CXR (71020TC) TLB-Lipid Panel (80061-LIPID) TLB-BMP (Basic Metabolic Panel-BMET) (80048-METABOL) TLB-CBC Platelet - w/Differential (85025-CBCD) TLB-Hepatic/Liver Function Pnl (80076-HEPATIC) TLB-TSH (Thyroid Stimulating Hormone) (84443-TSH) TLB-Udip ONLY (81003-UDIP) Misc. Referral (Misc. Ref)  Patient Instructions: 1)  Prednisone 10 mg 4 each am x 2days, 2x2days, 1x2days and stop  2)  Pepcid 20 mg otc one at bedtime whenever having flare of resp symptoms plus diet 3)  GERD (REFLUX)  is a common cause of respiratory symptoms. It commonly presents without heartburn and can be treated with medication, but also with lifestyle changes including avoidance of late meals, excessive alcohol, smoking cessation, and avoid fatty foods, chocolate, peppermint, colas, red wine, and acidic juices such as orange juice. NO MINT OR MENTHOL PRODUCTS SO NO COUGH DROPS  4)  USE SUGARLESS CANDY INSTEAD (jolley ranchers)  5)  NO OIL BASED VITAMINS  6)  Return to office in  months, sooner if needed  Prescriptions: PREDNISONE 10 MG  TABS (PREDNISONE) 4 each am x 2days, 2x2days, 1x2days and stop  #14 x 0   Entered and Authorized by:   Nyoka Cowden MD   Signed by:    Nyoka Cowden MD on 12/24/2009   Method used:   [facility]ronically to        [facility] [phone]* (retail)       [address]       [place], [place]  [address]       Ph: [phone] or [phone]       Fax: [phone]   RxID:   [id number]    CardioPerfect ECG  ID: [id number] Patient: [name], [name] DOB: 22-Jan-1931 Age: 77 Years Old Sex: Female Race: Black Height: 62 Weight: 169 Status: Unconfirmed Past Medical History:  ARTHRITIS (ICD-716.90) Right clavicular head swelling 20003......................................................................Marland KitchenDr Noel Gerold RHINITIS, CHRONIC (ICD-472.0)...............................................................................Marland KitchenDr Linward Foster     - on immunotherapy since 5/08 HYPERLIPIDEMIA (ICD-272.4)    -target LDL less than 160 MORBID OBESITY (ICD-278.01)     -  ideal 142  Target wt  = 158   for BMI < 30  COLON POLYPS    -  colonoscopy 06/24/2006 with no adenomatous change, hyperplasia only OSTEOPENIA     -BMD  08/07/08  tspine -1.2,  Left femur -.4,  right femur +.2 Health Maintenance    - CPX December 13, 2008     - Td 1/05    - Pneumvax 2000   -  GYN [name] ([place])   Recorded: 12/24/2009 08:59 AM P/PR: 137 ms / 215 ms - Heart rate (maximum exercise) QRS: 85 QT/QTc/QTd: 380 ms / 392 ms / 34 ms - Heart rate (maximum exercise)  P/QRS/T axis: 56 deg / 32 deg / 37 deg - Heart rate (maximum exercise)  Heartrate: 67 bpm  Interpretation:

## 2010-10-06 NOTE — Assessment & Plan Note (Signed)
Summary: Primary svc/ CPX   Visit Type:  comprehensive health care eval Primary Provider/Referring Provider:  Sherene Sires  CC:  cpx fasting.  History of Present Illness: 77 ybf remote minimal smoking hx with seasonal rhiinits flares  previously evaluated by Dr. Maple Hudson and on  Sudafed p.r.n. with some improvement but not eradication especially in the mornings.  She denies any associated wheezing itching sneezing or sinus pain appearance regions, better overall since on shots May 08   02/26/08 ov:  change from vytorin to generic zocor  May 31, 2008 ov: stopped the Zocor due to muscle aches although the amount of statin was the same as and her Vytorin so rechallenged with 20 mg per day  December 13, 2008 cpx acutely worse cough, mostly dry,  at onset pollen seasons 2 weeks prior to ov with eyes itching and pnds no nocturnal exam without sorethroat or discolored mucus, sinus ha.  Pt denies any significant sore throat, fever, chills, sweats, unintended wt loss, pleuritic or exertional cp, orthopnea pnd or leg swelling.  Pt also denies any obvious fluctuation in symptoms with weather or environmental change or other alleviating or aggravating factors other than pollen as above.  No real change since shots started in 2008     Current Medications (verified): 1)  Adult Aspirin Low Strength 81 Mg  Tbdp (Aspirin) .... Take 1 Tablet By Mouth Once A Day 2)  Citrucel Fibershake   Powd (Methylcellulose (Laxative)) 3)  Centrum Silver   Tabs (Multiple Vitamins-Minerals) .... Once Daily 4)  Tums 500 Mg  Chew (Calcium Carbonate Antacid) .... Once Daily 5)  Simvastatin 40 Mg  Tabs (Simvastatin) .... One Half Each Evening 6)  Aleve 220 Mg  Tabs (Naproxen Sodium) .... Take As Needed For Back or Joint Pain 7)  Zyrtec Allergy 10 Mg  Tabs (Cetirizine Hcl) .... Take 1 Tab By Mouth At Bedtime As Needed For Itchy Sneezy Runny Nose 8)  Sudafed Pe Maximum Strength 10 Mg  Tabs (Phenylephrine Hcl) .... As Needed 9)  Allergy  Shots .... Once A Week  Allergies (verified): No Known Drug Allergies  Past History:  Past Medical History:    ARTHRITIS (ICD-716.90)    Right clavicular head swelling 14782......................................................................Marland KitchenDr Noel Gerold    RHINITIS, CHRONIC (ICD-472.0)...............................................................................Marland KitchenDr Linward Foster        - on immunotherapy since 5/08    HYPERLIPIDEMIA (ICD-272.4)       -target LDL less than 160    MORBID OBESITY (ICD-278.01)        -  ideal 142  Target wt  = 158   for BMI < 30     COLON POLYPS       -  colonoscopy 06/24/2006 with no adenomatous change, hyperplasia only    OSTEOPENIA        -BMD  08/07/08  tspine -1.2,  Left femur -.4,  right femur +.2    Health Maintenance       - CPX December 13, 2008        - Td 1/05       - Pneumvax 2000      -  GYN [name] ([place])       Family History:    Reviewed history from 08/16/2008 and no changes required:       Family History Colon Cancer in mother (in computer for recall colonoscopy 10/12)       she is an only child and knows nothing about her father's health  Social History:    Reviewed history from 04/19/2008 and no changes required:       Patient states former smoker quit as teenager       Negative history of passive tobacco smoke exposure.        Exercise-2 times weekly       Caffeine-2 cups daily       Marrie dwith 4 children  Vital Signs:  Patient profile:   77 year old female Height:      62.5 inches Weight:      173 pounds BMI:     31.25 Temp:     97.7 degrees F oral Pulse rate:   75 / minute BP sitting:   130 / 84  (left arm)  Vitals Entered By: Vernie Murders (December 13, 2008 8:41 AM)  O2 Sat on room air at rest %:  99  Physical Exam  Additional Exam:  wt  172 August 16, 2008 >  173 December 13, 2008  Ambulatory healthy appearing in no acute distress. Afeb with normal vital signs HEENT: nl dentition, turbinates, and  orophanx. Nl external ear canals without cough reflex Neck without JVD/Nodes/TM Lungs clear to A and P bilaterally without cough on insp or exp maneuvers RRR no s3 or murmur or increase in P2 Abd soft and benign with nl excursion in the supine position. No bruits or organomegaly Ext warm without calf tenderness, cyanosis clubbing or edema Skin warm and dry without lesions     CXR  Procedure date:  12/13/2008  Findings:      wnl  Impression & Recommendations:  Problem # 1:  RHINITIS, CHRONIC (ICD-472.0)  No response to immmunotherapy to date.  Will need cycle prednisone and try change antihistamines to chlortrimeton per cough guidelines.  Orders: Est. Patient Level V (16109)  Problem # 2:  COUGH (ICD-786.2)  cxr ok, likely Classic upper airway cough syndrome, so named because it's frequently impossible to sort out how much is LPR vs CR/sinusitis with freq throat clearing generating secondary extra esophageal GERD from wide swings in gastric pressure that occur with throat clearing, promoting self use of mint and menthol lozenges that reduce the lower esophageal sphincter tone and exacerbate the problem further   See instructions for specific recommendations   Orders: Est. Patient Level V (60454)  Problem # 3:  HYPERLIPIDEMIA (ICD-272.4) Target < 160 Her updated medication list for this problem includes:    Simvastatin 40 Mg Tabs (Simvastatin) ..... One half each evening  Labs Reviewed: SGOT: 19 (08/16/2008)   SGPT: 18 (08/16/2008)   HDL:76.7 (08/16/2008), 78.3 (05/31/2008)  LDL:DEL (08/16/2008), DEL (05/31/2008)  Chol:217 (08/16/2008), 237 (05/31/2008)  Trig:67 (08/16/2008), 61 (05/31/2008)  LDL 136 so reasonable level of control  Problem # 4:  MORBID OBESITY (ICD-278.01)  Weight control is a matter of calorie balance which needs to be tilted in the pt's favor by eating less and exercising more.  Specifically, I recommended  exercise at a level where pt  is short of  breath but not out of breath 30 minutes daily.  If not losing weight on this program, I would strongly recommend pt see a nutritionist with a food diary recorded for two weeks prior to the visit.     Orders: Est. Patient Level V (09811)  Medications Added to Medication List This Visit: 1)  Prednisone 10 Mg Tabs (Prednisone) .... 4 each am x 2days, 2x2days, 1x2days and stop  Other Orders: TLB-Lipid Panel (80061-LIPID) TLB-BMP (Basic Metabolic Panel-BMET) (80048-METABOL)  TLB-CBC Platelet - w/Differential (85025-CBCD) TLB-TSH (Thyroid Stimulating Hormone) (84443-TSH) TLB-Hepatic/Liver Function Pnl (80076-HEPATIC) TLB-CRP-Full Range (C-Reactive Protein) (86140-FCRP) T-2 View CXR, Same Day (71020.5TC)  Patient Instructions: 1)  Try chlortrimeton 4 mg every 6 hours as needed for drainage 2)  Prednisone 10 mg 4 each am x 2days, 2x2days, 1x2days and stop if not responding to chlortrimeton  3)  Please schedule a follow-up appointment in 3 months. Prescriptions: PREDNISONE 10 MG  TABS (PREDNISONE) 4 each am x 2days, 2x2days, 1x2days and stop  #14 x 0   Entered and Authorized by:   Nyoka Cowden MD   Signed by:   Nyoka Cowden MD on 12/13/2008   Method used:   [facility] to        [facility] [phone]* (retail)       [address]       [place], [place]  [address]       Ph: [phone] or [phone]       Fax: [phone]   RxID:   [id number]

## 2010-10-06 NOTE — Assessment & Plan Note (Signed)
Summary: Primary svc/ fu hyperlipidemia   PCP:  Sharrell Krawiec  Chief Complaint:  3 month fu......Marland Kitchenreviewed meds.......  History of Present Illness: 77 ybf remote minimal smoking hx with seasonal rhiinits flare since 2/09 esp in am, previously evaluated by Dr. Maple Hudson and on  Sudafed p.r.n. with some improvement but not eradication especially in the mornings.  She denies any associated wheezing itching sneezing or sinus pain appearance regions, better overall since on shots May 08  The main reason for visit today is to follow up long-term problems with obesity complicated by hyperlipidemia and borderline diabetes and hypertension.  She has never been under treatment for either diabetes or hypertension to date other than what diet and lifestyle modification, which she finds difficult   02/26/08 ov:  change from vytorin to generic zocor  May 31, 2008 ov: stopped the Zocor due to muscle aches although the amount of statin was the same as and her Vytorin so rechallenged with 20 mg per day  August 16, 2008 ov fasting to recheck lipids on zocor,  co dysuria x 1 week, no  back pain or fever or chills.  No ex cp, tia or claudication.      Updated Prior Medication List: ADULT ASPIRIN LOW STRENGTH 81 MG  TBDP (ASPIRIN) Take 1 tablet by mouth once a day CITRUCEL FIBERSHAKE   POWD (METHYLCELLULOSE (LAXATIVE))  CENTRUM SILVER   TABS (MULTIPLE VITAMINS-MINERALS) once daily TUMS 500 MG  CHEW (CALCIUM CARBONATE ANTACID) once daily SIMVASTATIN 40 MG  TABS (SIMVASTATIN) one half each evening ALEVE 220 MG  TABS (NAPROXEN SODIUM) take as needed for back or joint pain ZYRTEC ALLERGY 10 MG  TABS (CETIRIZINE HCL) Take 1 tab by mouth at bedtime as needed for itchy sneezy runny nose SUDAFED PE MAXIMUM STRENGTH 10 MG  TABS (PHENYLEPHRINE HCL) as needed * ALLERGY SHOTS once a week  Current Allergies (reviewed today): No known allergies   Past Medical History:    ARTHRITIS (ICD-716.90)    Right clavicular head  swelling 62130......................................................................Marland KitchenDr Noel Gerold    RHINITIS, CHRONIC (ICD-472.0)...............................................................................Marland KitchenDr Linward Foster        - on immunotherapy since 5/08    HYPERLIPIDEMIA (ICD-272.4)       -target LDL less than 160    MORBID OBESITY (ICD-278.01)        -  ideal 142  Target wt  = 158   for BMI < 30     COLON POLYPS       -  colonoscopy 06/24/2006 with no adenomatous change, hyperplasia only    Health Maintenance       - CPX 3/09       - Td 1/05       - Pneumvax 2000        Family History:    Family History Colon Cancer in mother (in computer for recall colonoscopy 10/12)    she is an only child and knows nothing about her father's health      Social History:    Reviewed history from 04/19/2008 and no changes required:       Patient states former smoker quit as teenager       Negative history of passive tobacco smoke exposure.        Exercise-2 times weekly       Caffeine-2 cups daily       Marrie dwith 4 children    Review of Systems  The patient denies anorexia, fever, weight loss, weight gain, vision loss, decreased hearing, hoarseness, chest pain, syncope,  dyspnea on exertion, peripheral edema, prolonged cough, headaches, hemoptysis, abdominal pain, melena, hematochezia, severe indigestion/heartburn, hematuria, incontinence, muscle weakness, suspicious skin lesions, transient blindness, difficulty walking, depression, unusual weight change, abnormal bleeding, and enlarged lymph nodes.     Vital Signs:  Patient Profile:   77 Years Old Female Height:     62.5 inches Weight:      172.38 pounds BMI:     31.14 O2 Sat:      98 % O2 treatment:    Room Air Temp:     98.0 degrees F oral Pulse rate:   80 / minute BP sitting:   140 / 92  (left arm) Cuff size:   regular  Pt. in pain?   no  Vitals Entered By: Clarise Cruz Duncan Dull) (August 16, 2008 9:32 AM)                   Physical Exam  wt  168   May 31, 2008 > 172 August 16, 2008  Ambulatory healthy appearing in no acute distress. Afeb with normal vital signs HEENT: nl dentition, turbinates, and orophanx. Nl external ear canals without cough reflex Neck without JVD/Nodes/TM Lungs clear to A and P bilaterally without cough on insp or exp maneuvers RRR no s3 or murmur or increase in P2 Abd soft and benign with nl excursion in the supine position. No bruits or organomegaly Ext warm without calf tenderness, cyanosis clubbing or edema Skin warm and dry without lesions        Impression & Recommendations:  Problem # 1:  HYPERLIPIDEMIA (ICD-272.4)  Her updated medication list for this problem includes:    Simvastatin 40 Mg Tabs (Simvastatin) ..... One half each evening  Labs Reviewed: Chol: 237 (05/31/2008)   HDL: 78.3 (05/31/2008)   LDL: 150.2 (05/31/2008)   TG: 61 (05/31/2008) SGOT: 18 (05/31/2008)   SGPT: 19 (05/31/2008)   LDL now down to 117 so at target, no elevation of lft's or aches so no change in rx  Orders: TLB-Lipid Panel (80061-LIPID) TLB-Hepatic/Liver Function Pnl (80076-HEPATIC) Est. Patient Level IV (43329)   Problem # 2:  DYSURIA (ICD-788.1) u/a cw cystitis Her updated medication list for this problem includes:    Cipro 500 Mg Tabs (Ciprofloxacin hcl) .Marland Kitchen... Take one half twice daily    Orders: TLB-Udip ONLY (81003-UDIP) TLB-Udip w/ Micro (81001-URINE) Est. Patient Level IV (51884)   Problem # 3:  MORBID OBESITY (ICD-278.01) wt trending up again, reviewed with pt reasonable goal of 1 lb every other week to goal of 158 to get bmi < 30  Medications Added to Medication List This Visit: 1)  Simvastatin 40 Mg Tabs (Simvastatin) .... One half each evening 2)  Allergy Shots  .... Once a week 3)  Cipro 500 Mg Tabs (Ciprofloxacin hcl) .... Take one half twice daily   Patient Instructions: 1)  Please schedule a follow-up appointment in 11/2008 for cpx 2)   we will call  with lab results   Prescriptions: CIPRO 500 MG  TABS (CIPROFLOXACIN HCL) Take one half twice daily  #5 x 0   Entered and Authorized by:   Nyoka Cowden MD   Signed by:   Nyoka Cowden MD on 08/16/2008   Method used:   Electronically to        [facility] [phone][facility] (retail)       [address] [place], [place]  [address]  Ph: [phone] or [phone]       Fax: [phone]   RxID:   [email or web address]  ]

## 2010-10-19 ENCOUNTER — Encounter: Payer: Self-pay | Admitting: Internal Medicine

## 2010-11-03 NOTE — Consult Note (Signed)
Summary: [facility]  [facility]   Imported By: Sherian Rein 10/26/2010 08:39:43  _____________________________________________________________________  External Attachment:    Type:   Image     Comment:   External Document

## 2010-11-12 ENCOUNTER — Encounter: Payer: Self-pay | Admitting: Adult Health

## 2010-11-12 ENCOUNTER — Ambulatory Visit (INDEPENDENT_AMBULATORY_CARE_PROVIDER_SITE_OTHER): Payer: Medicare Other | Admitting: Adult Health

## 2010-11-12 DIAGNOSIS — J310 Chronic rhinitis: Secondary | ICD-10-CM

## 2010-11-17 NOTE — Assessment & Plan Note (Signed)
Summary: OV CONGESTION SINUS AND CHEST//SH   Visit Type:  NP sick visit Primary Provider/Referring Provider:  Sherene [name]  CC:  Pt c/o sneezing, coughing, fever, chills, nasal drainage, and head and throat congestion x 1 week. Also feels dizzy in the AM.  History of Present Illness: 79 ybf remote minimal smoking hx with seasonal rhiinits flares  better overall since started on immunotherapy  May2008   02/26/08 ov:  change from vytorin to generic zocor  May 31, 2008 ov: stopped the Zocor due to muscle aches although the amount of statin was the same as her Vytorin so rechallenged with 20 mg per day  February 14, 2009 ov f/u lipid profile, no new symptoms, denies tia or claudication, cp. tolerating zocor, no change in rx.    December 24, 2009 sick x 2 weeks itching, sneezing runny watery no purulent secretions, no sob. already tried zyrtec and sudafed.  rec pred dose pack  March 18, 2010 ov c/o unexplained bruise on her rt hand x one week- not painful.  She also c/o cramping in her hands and feet x several months- she questions if this is from taking statin. no injury, no other bruising/bleeding. rec try off statin.  decided to stop all meds  April 30, 2010 Followup.  Pt c/o nasal congestion and scratchy throat x 2 wks- she gets allergy inj every wk otherwise not consistently taking any meds. rec Resume baby aspirin and if you notice excess bruising or bleeding try 3 x weekly M W F Resume simvastatin at one  half pill daily if tolerated > did fine  July 15, 2010  ov cc Nasal congestion- some better, right knee variably hurting x 3 months, minimal swelling, not sure how to use aleve but when he takes it works well.   >>no changes   November 12, 2010 --Preesnts for an acute office visit. Husband with similar symptoms few weeks ago.  Pt complains of sneezing, coughing, fever, chills, nasal drainage, head and throat congestion x 3 week. Saline not helping. Denies chest pain,  orthopnea, hemoptysis,  fever, n/v/d.     Preventive Screening-Counseling & Management  Alcohol-Tobacco     Smoking Status: quit     Passive Smoke Exposure: no  Medications Prior to Update: 1)  Allergy Vaccine 1:10 Go ([name]) .... Once A Week 2)  Zyrtec Allergy 10 Mg  Tabs (Cetirizine Hcl) .... Take 1 Tab By Mouth At Bedtime As Needed For Itchy Sneezy Runny Nose 3)  Aspirin 81 Mg Chew (Aspirin) .... One Daily 4)  Sudafed Pe Maximum Strength 10 Mg  Tabs (Phenylephrine Hcl) .... As Needed 5)  Aleve 220 Mg Tabs (Naproxen Sodium) .... One Twice Daily With Meals As Needed 6)  Epipen 0.3 Mg/0.12ml Devi (Epinephrine) .... For Severe Allergic Reaction 7)  Simvastatin 40 Mg Tabs (Simvastatin) .... 1/2 Once Daily  Current Medications (verified): 1)  Allergy Vaccine 1:10 Go ([name]) .... Once A Week 2)  Simvastatin 40 Mg Tabs (Simvastatin) .... 1/2 Once Daily 3)  Zyrtec Allergy 10 Mg  Tabs (Cetirizine Hcl) .... Take 1 Tab By Mouth At Bedtime As Needed For Itchy Sneezy Runny Nose 4)  Sudafed Pe Maximum Strength 10 Mg  Tabs (Phenylephrine Hcl) .... As Needed 5)  Aleve 220 Mg Tabs (Naproxen Sodium) .... One Twice Daily With Meals As Needed 6)  Epipen 0.3 Mg/0.27ml Devi (Epinephrine) .... For Severe Allergic Reaction 7)  Tussin Dm 100-10 Mg/80ml Syrp (Dextromethorphan-Guaifenesin) .... Three Times A Day  Allergies (verified): No  Known Drug Allergies  Past History:  Past Medical History: Last updated: 07/15/2010 ARTHRITIS (ICD-716.90) Right clavicular head swelling 16109......................................................................Marland KitchenDr [name] RHINITIS, CHRONIC (ICD-472.0)...............................................................................Marland KitchenDr [name]     - on immunotherapy since 5/08 HYPERLIPIDEMIA (ICD-272.4)    -target LDL less than 160 > try off due to musle aches March 18, 2010  MORBID OBESITY (ICD-278.01)     -  ideal   142  Target wt  = 158   for BMI < 30  COLON POLYPS    -  colonoscopy  06/24/2006 with no adenomatous change, hyperplasia only OSTEOPENIA     -BMD  08/07/08  tspine -1.2,  Left femur -.4,  right femur +.2     - Repeat 01/02/2010       1.9                    0.1                     0.5 Health Maintenance.......................................................Marland KitchenWert    - CPX December 24, 2009     - Td 1/05    - Pneumvax 2000 age 56   -  GYN [name] ([place])    Family History: Last updated: 08/16/2008 Family History Colon Cancer in mother (in computer for recall colonoscopy 10/12) she is an only child and knows nothing about her father's health  Social History: Last updated: 04/19/2008 Patient states former smoker quit as teenager Negative history of passive tobacco smoke exposure.  Exercise-2 times weekly Caffeine-2 cups daily Marrie dwith 4 children  Risk Factors: Smoking Status: quit (11/12/2010) Passive Smoke Exposure: no (11/12/2010)  Review of Systems      See HPI  Vital Signs:  Patient profile:   77 year old female Height:      62 inches Weight:      170 pounds BMI:     31.21 O2 Sat:      100 % on Room air Temp:     98.5 degrees F oral Pulse rate:   73 / minute BP sitting:   112 / 66  (left arm) Cuff size:   regular  Vitals Entered By: [name] CMA (November 12, 2010 2:41 PM)  O2 Flow:  Room air CC: Pt c/o sneezing, coughing, fever, chills, nasal drainage, head and throat congestion x 1 week. Also feels dizzy in the AM Comments Medications reviewed with patient Verified contact number and pharmacy with patient [name] CMA  November 12, 2010 2:42 PM    Physical Exam  Additional Exam:    Ambulatory healthy appearing in no acute distress. Skin: no rash or excoriation Afeb with normal vital signs HEENT full: dentures, nasal mucosa clear drainage  Nl external ear canals  Neck without JVD/Nodes/TM Lungs coarse BS w/ no wheezing RRR no s3 or murmur or increase in P2.  no edema Abd soft and benign  Ext warm without calf  tenderness, cyanosis clubbing  Skin no other bruises or petchiae   Impression & Recommendations:  Problem # 1:  RHINITIS, CHRONIC (ICD-472.0)  Flare with URI -xopenex neb  Plan;  ZPack take as directed.  Mucinex DM two times a day as needed cough/congestion  Saline nasal rinses as needed  Nasonex 2 puffs  two times a day until sample is gone.  Afrin 2 puffs two times a day for 3 days Please contact office for sooner follow up if symptoms do not improve or worsen  Tylenol as needed   Orders: Est.  Patient Level III (04540)  Medications Added to Medication List This Visit: 1)  Tussin Dm 100-10 Mg/37ml Syrp (Dextromethorphan-guaifenesin) .... Three times a day 2)  Zithromax Z-pak 250 Mg Tabs (Azithromycin) .... Take as directed. 3)  Hydromet 5-1.5 Mg/100ml Syrp (Hydrocodone-homatropine) .... 1/2-1 tsp every 6hr as needed cough , may make you sleepy  Patient Instructions: 1)  ZPack take as directed.  2)  Mucinex DM two times a day as needed cough/congestion  3)  Saline nasal rinses as needed  4)  Nasonex 2 puffs  two times a day until sample is gone.  5)  Afrin 2 puffs two times a day for 3 days 6)  Please contact office for sooner follow up if symptoms do not improve or worsen  7)  Tylenol as needed  Prescriptions: HYDROMET 5-1.5 MG/5ML SYRP (HYDROCODONE-HOMATROPINE) 1/2-1 tsp every 6hr as needed cough , may make you sleepy  #8oz x 0   Entered and Authorized by:   Rubye Oaks NP   Signed by:   Tammy Parrett NP on 11/12/2010   Method used:   Print then Give to Patient   RxID:   [id number] ZITHROMAX Z-PAK 250 MG TABS (AZITHROMYCIN) take as directed.  #1 x 0   Entered and Authorized by:   Rubye Oaks NP   Signed by:   Tammy Parrett NP on 11/12/2010   Method used:   [facility]ronically to        [id number]       [address]       [place], [place]  [address]       Ph: [phone] or [phone]       Fax: [phone]   RxID:    [id number]

## 2010-12-02 ENCOUNTER — Encounter: Payer: Self-pay | Admitting: Internal Medicine

## 2010-12-09 ENCOUNTER — Encounter: Payer: Self-pay | Admitting: Internal Medicine

## 2010-12-09 ENCOUNTER — Ambulatory Visit (INDEPENDENT_AMBULATORY_CARE_PROVIDER_SITE_OTHER): Payer: Medicare Other | Admitting: Internal Medicine

## 2010-12-09 VITALS — BP 146/74 | HR 67 | Temp 97.5°F | Ht 62.0 in | Wt 169.6 lb

## 2010-12-09 DIAGNOSIS — E785 Hyperlipidemia, unspecified: Secondary | ICD-10-CM

## 2010-12-09 DIAGNOSIS — J310 Chronic rhinitis: Secondary | ICD-10-CM

## 2010-12-09 MED ORDER — SIMVASTATIN 40 MG PO TABS: ORAL_TABLET | ORAL | Status: DC

## 2010-12-09 NOTE — Progress Notes (Signed)
  Subjective:    Patient ID: [name], female    DOB: [birth year], 77 y.o.   MRN: [id number]  HPI   79 ybf remote minimal smoking hx with seasonal rhiinits flares better overall since started on immunotherapy May2008      July 15, 2010 ov cc Nasal congestion- some better, right knee variably hurting x 3 months, minimal swelling, not sure how to use aleve but when he takes it works well. >>no changes   12/09/2010 ov f/u allergic rhinitis and hyperlipidemia, no active symptoms. Pt denies any significant sore throat, dysphagia, itching, sneezing,  nasal congestion or excess/ purulent secretions,  fever, chills, sweats, unintended wt loss, pleuritic or exertional cp, hempoptysis, orthopnea pnd or leg swelling.        Past Medical History:    ARTHRITIS (ICD-716.90)  Right clavicular head swelling 69629......................................................................Marland KitchenDr Noel Gerold  RHINITIS, CHRONIC (ICD-472.0)...............................................................................Marland KitchenDr Linward Foster  - on immunotherapy since 01/2007 HYPERLIPIDEMIA (ICD-272.4)  -target LDL less than 160 > try off due to musle aches March 18, 2010 > rechallenged with half dose fall 2011 and tol ok MORBID OBESITY (ICD-278.01)  - ideal 142 Target wt = 158 for BMI < 30  COLON POLYPS  - colonoscopy 06/24/2006 with no adenomatous change, hyperplasia only  OSTEOPENIA  -BMD 08/07/08 tspine -1.2, Left femur -.4, right femur +.2  - Repeat 01/02/2010 1.9 0.1 0.5  Health Maintenance.......................................................Marland KitchenWert  - CPX December 24, 2009  - Td 1/05  - Pneumvax 2000 age 10  - GYN [name] ([place])   Family History:  Last updated: 08/16/2008  Family History Colon Cancer in mother (in computer for recall colonoscopy 10/12)  she is an only child and knows nothing about her father's health   Social History:  Patient states former smoker quit age 34  Negative history of passive  tobacco smoke exposure.  Exercise-2 times weekly  Caffeine-2 cups daily  Marrie dwith 4 children  Risk Factors:  Smoking Status: quit  Age 80        Review of Systems     Objective:   Physical Exam Ambulatory healthy appearing in no acute distress.  Skin: no rash or excoriation  Wt  170 > 169 12/09/2010  HEENT full: dentures, nasal mucosa clear drainage Nl external ear canals  Neck without JVD/Nodes/TM  Lungs coarse BS w/ no wheezing  RRR no s3 or murmur or increase in P2. no edema  Abd soft and benign  Ext warm without calf tenderness, cyanosis clubbing  Skin no other bruises or petchiae                  Assessment & Plan:

## 2010-12-09 NOTE — Assessment & Plan Note (Signed)
Reviewed rx    Each maintenance medication was reviewed in detail including most importantly the difference between maintenance and as needed and under what circumstances the prns are to be used.  Please see instructions for details which were reviewed in writing and the patient given a copy.

## 2010-12-09 NOTE — Patient Instructions (Signed)
Weight control is simply a matter of calorie balance which needs to be tilted in your favor by eating less and exercising more.  To get the most out of exercise, you need to be continuously aware that you are short of breath, but never out of breath, for 30 minutes daily. As you improve, it will actually be easier for you to do the same amount of exercise  in  30 minutes so always push to the level where you are short of breath.  If this does not result in gradual weight reduction then I strongly recommend you see a nutritionist with a food diary x 2 weeks so that we can work out a negative calorie balance which is universally effective in steady weight loss programs.  Think of your calorie balance like you do your bank account where in this case you want the balance to go down so you must take in less calories than you burn up.  It's just that simple:  Hard to do, but easy to understand.  Good luck!   Return for CPX in one month

## 2010-12-09 NOTE — Assessment & Plan Note (Signed)
tol statin at present level but not fasting > needs cpx    Each maintenance medication was reviewed in detail including most importantly the difference between maintenance and as needed and under what circumstances the prns are to be used.  Please see instructions for details which were reviewed in writing and the patient given a copy.

## 2011-01-19 NOTE — Assessment & Plan Note (Signed)
[facility]                             PULMONARY OFFICE NOTE   NAME:[name], [name]                     MRN:          [id number]  DATE:08/11/2007                            DOB:          Apr 21, 1931    HISTORY:  A very nice 77 year old black female with morbid obesity  complicated by borderline diabetes, hyperlipidemia.  Her target LDL was  last 160.  She had developed cramps while on Crestor but doing well on  Vytorin with no significant muscle cramps.  She does have aches and  pains in various joints for which she intermittently receives injections  of cortisone and did not know that she could use more Aleve than 1-2 a  week, but this also provides relief.  She denies any exertional chest  pain, claudication, fevers or chills, sweats orthopnea, PND, or leg  swelling, TIA or claudication symptoms.   For full inventory of medications, please see face sheet dated August 11, 2007.   PHYSICAL EXAMINATION:  This is a pleasant ambulatory black female in no  acute distress.  She has dropped 2 pounds previous visit down to 167.  Afebrile, normal vital signs.  Blood pressure 120/64.  HEENT:  Unremarkable.  Pharynx clear.  Lung fields are completely clear to auscultation and percussion  bilaterally.  HEART:  Regular rhythm without murmur, gallop, or rub.  ABDOMEN:  Benign.  EXTREMITIES:  Warm without calf tenderness, cyanosis, or clubbing.   IMPRESSION:  1. Morbid obesity with target weight less than 146.  She has lost 2      more pounds voluntarily and should have improved her blood sugars      to normal.  I have advised her that the reason we are not treating      this more aggressively is that the main treatment is, in fact,      weight loss, which she is achieving.  2. Hyperlipidemia.  We discussed the risks, benefits, and alternatives      of various approaches including the controversy regarding Vytorin.      She is doing so well on Vytorin and  could not tolerate other      statins, I think it is reasonable to continue the present level but      as she loses weight, we may be able to reduce the dose of Vytorin      or switch her over to Zocor monotherapy and generic to save her      quite a bit of money.  I have advised her of this as well.  We will      revisit this issue when she returns for her comprehensive      evaluation in February 2009.  3. Health maintenance.  She was updated on Influenza, October 2008.  4. Degenerative arthritis.  I learned today that she really is not      using Aleve very aggressively.  I advised her as long as she is      taking it with meals on a p.r.n. basis, she should be fine      increasing  to as much as 2 per day and down, of course, to 0 when      she is feeling better.      We will also check a renal function to look for adverse effect from      this and recheck this in February 2009, at the time she comes in      for her comprehensive evaluation.     [name]. Sherene Sires, MD, [facility]  Electronically Signed    MBW/MedQ  DD: 08/11/2007  DT: 08/11/2007  Job #: [id number]

## 2011-01-19 NOTE — Assessment & Plan Note (Signed)
[facility]                             PULMONARY OFFICE NOTE   NAME:[name], [name]                     MRN:          [id number]  DATE:05/02/2007                            DOB:          [birth year]    PROBLEM LIST:  Allergic rhinitis.   HISTORY:  She continues regular followup with Dr. Sherene Sires for her primary  care.  She remains on allergy vaccine at 1:50, which she gets from our  lab based on our skin testing, administered by Dr. Oretha Milch at their  [facility] office for convenience.  This has gone well.  She is due to  reorder.  In the last 2 weeks she has had increased sinus congestion and  nasal stuffiness with little discharge or sneezing.  She was taking  Zyrtec and began taking NyQuil at bedtime.  She had used generic Afrin  and we discussed this carefully.  Occasional nasal saline.  Usually no  asthma but blames postnasal drainage for making her cough.  In the past  Rhinocort helped and she does not remember why she was taken off of  that.  She does recognize some reflux.   MEDICATIONS:  1. Aspirin 81 mg.  2. Citrucel.  3. Calcium.  4. Vitamins.  5. Vytorin 40/10.  6. Allergy vaccine injections once a week.  7. Zyrtec p.r.n.  8. Aleve occasionally.   No medication allergy.   OBJECTIVE:  VITAL SIGNS:  Weight 168 pounds, BP 112/68, pulse 65, room  air saturation 100%.  HEENT:  Scant mucus in the nose.  There is some periorbital puffiness.  Minimal turbinate edema.  Pharynx is somewhat red with no visible  drainage or exudate.  NECK:  There is no cervical adenopathy.  Voice quality is normal with no  stridor.  CHEST:  Clear.  HEART:  Sounds regular without murmur.   IMPRESSION:  1. Allergic rhinitis.  2. Pharynx looks like gastroesophageal reflux disease type irritation.   PLAN:  1. Sudafed PE.  2. Okay to use NyQuil occasionally as discussed.  3. Caution with use of Afrin as discussed.  4. Saline nasal lavage.  5. We will  advance her vaccine to 1:10.  6. Reflux precautions were re-emphasized.  7. Schedule return in 1 year, earlier p.r.n.     Clinton D. Maple Hudson, MD, [facility], FACP  Electronically Signed    CDY/MedQ  DD: 05/07/2007  DT: 05/07/2007  Job #: [id number]

## 2011-01-19 NOTE — Assessment & Plan Note (Signed)
[facility]                             PULMONARY OFFICE NOTE   NAME:[name], [name]                     MRN:          [id number]  DATE:05/05/2007                            DOB:          05/28/1931    PRIMARY SERVICE/FOLLOWUP OFFICE VISIT   HISTORY:  A 77 year old black female with morbid obesity complicated by  hyperlipidemia doing well since her last visit with no exertional chest  pain, orthopnea, PND, or leg swelling.  She has become relatively  sedentary.   For full inventory of medications, please see face sheet dated May 05, 2007.   PHYSICAL EXAMINATION:  She is a pleasant ambulatory black female in no  acute distress.  VITAL SIGNS:  Stable vital signs.  Weight 169 pounds, no change in  baseline.  HEENT:  Unremarkable  oropharynx is clear.  LUNGS:  The lung fields are clear bilaterally to auscultation and  percussion.  CARDIAC:  Regular rate and rhythm without murmur, gallop, or rub.  ABDOMEN:  Soft and benign.  EXTREMITIES:  Warm without calf tenderness, cyanosis, clubbing, or  edema.   IMPRESSION:  1. Morbid obesity complicated by hyperlipidemia.  She is on Vytorin      and has not been able to tolerate other Statins, and I informed her      that Vytorin may not be as much of a benefit as previously thought,      but certainly is not having any adverse effects as far as we know,      and therefore, she should continue it for now.  We will check a      lipid profile and LFT to look for adverse drug effect.  2. Borderline elevation of blood sugar that was also noted on previous      evaluation, but does not meet the diabetic threshold.  I have      reviewed with her optimal calorie balance issues, including aerobic      workout, and will redo a fasting blood sugar today.  Followup will      be every 3 months and sooner if needed.     [name]. Sherene Sires, MD, [facility]  Electronically Signed    MBW/MedQ  DD: 05/05/2007  DT:  05/06/2007  Job #: [id number]

## 2011-01-19 NOTE — Assessment & Plan Note (Signed)
[facility]                             PULMONARY OFFICE NOTE   NAME:[name], [name]                     MRN:          [id number]  DATE:01/26/2007                            DOB:          02/18/1931    HISTORY:  A 77 year old black female fell yesterday at [facility] after  she tripped over a concrete barrier at the end of a parking space.  She  struck her right shoulder and knee, and has been putting heat on her  right shoulder which bothers her more than anything else.  She also has  abrasions over her right elbow and knee and cleaned these up nicely, but  wanted to be seen for evaluation.  She previously was supposed to be  taking Alleve p.r.n. arthritis symptoms, but did not know she could take  it for this.  She is fully ambulatory and is able to bear weight on her  right leg and also move her shoulder normally, and denies striking her  head or her hip.   For full information on medications, please see patient sheet column  dated Jan 26, 2007.  Note that she is on baby aspirin daily.   PHYSICAL EXAMINATION:  She is a pleasant, ambulatory, mildly obese black  female in no acute distress.  She is afebrile, normal vital signs.  HEENT:  Unremarkable. Oropharynx clear.  NECK:  Supple without cervical adenopathy or tenderness.  Trachea is  midline, no thyromegaly.  Lungs fields are clear bilaterally to auscultation and percussion.  There is minimal discomfort on external rotation and abduction above the  horizontal in the right shoulder, but there is no crepitus or obvious  bruising.  There is a very small abrasion over the right elbow and also  right knee, but the knee appears normal and she is fully ambulatory as  noted.  Heart is regular rhythm without murmurs, gallops, or rubs.  ABDOMEN:  Soft, benign.  EXTREMITIES:  Warm without calf tenderness, cyanosis, clubbing, or  edema.   IMPRESSION:  Multiple minor abrasions and contusions status  post fall on  concrete yesterday.  I have advised her, obviously, to be careful  walking, but also to apply ice for the first 48 hours and then switch  over to heat.   I have also noted for the record that her orthopedist is Dr. Noel Gerold, and  that if joint pain does not improve with either Alleve or Advil (I have  advised her to choose one or the other), then she should see Dr. Noel Gerold  in followup.  Followup here will be every 3 months, sooner if needed.     [name]. Sherene Sires, MD, [facility]  Electronically Signed    MBW/MedQ  DD: 01/26/2007  DT: 01/26/2007  Job #: [id number]

## 2011-01-22 NOTE — Assessment & Plan Note (Signed)
[facility]                               PULMONARY OFFICE NOTE   NAME:[name], [name]                     MRN:          [id number]  DATE:03/25/2006                            DOB:          [birth year]    PROBLEMS:  Allergic rhinitis, seasonal and perennial.   HISTORY:  After allergy skin testing in May, she began allergy vaccine but  found it too difficult to get here regularly for injections.  She was able  to arrange to get our vaccine administered at [facility] (Dr. Donell Sievert) at the [facility] office at [address], [place], [place] [address].  She wishes to continue primary followup at  this office where she sees Dr. Sherene Sires for primary care.  She does not want to  change her relationships, although I pointed out that we could transfer her  allergy management to Dr. Donell Sievert.  She says she has not been needing any  medication for her respiratory concerns recently but would like a sample of  Zyrtec 10 mg for trial anticipating she may need an antihistamine a little  later.  She has noted minor nasal congestion primarily in the mornings until  she takes her shower which clears it for the day.  There has been little  nasal discharge.  She thinks she sneezes perhaps once or twice a week, and  we both acknowledged that this was pretty minimal.  She expects more nasal  congestion as the fall season comes, but she is not having any wheeze, cough  or chest tightness now and nothing purulent or discolored.   MEDICATIONS:  1.  Aspirin 81 mg.  2.  Citrucel one teaspoon daily.  3.  Calcium 1000 mg.  4.  Multivitamin.  5.  Allergy vaccine (tree mix and Greer dust, grass mix, ragweed, cocklebur,      mugwort).  6.  She uses over-the-counter Tylenol Sinus.  7.  I did add Zyrtec 10 mg previously.   OBJECTIVE:  Weight 166 pounds, BP 112/68, pulse regular at 74, room air  saturation 100%.  Nasal mucosa is somewhat pale but  not edematous.  No  visible polyps or postnasal drainage.  Her pharynx is clear.  Conjunctivae  are clear.  Lungs are clear to P&A.  Heart sounds are regular without murmur  or gallop.  She is an intelligent lady asking good questions and clear about  her management goals.   IMPRESSION:  Allergic rhinitis, doing well.   PLAN:  1.  Continue allergy vaccine.  2.  Sample of Zyrtec 10 mg.  She has prescription.  3.  Schedule return in two months to reevaluate fall season, earlier p.r.n.                                   Clinton D. Maple Hudson, MD, FCCP, FACP   CDY/MedQ  DD:  03/26/2006  DT:  03/26/2006  Job #:  [id number]   cc:   Maryelizabeth Kaufmann, Dr.

## 2011-01-22 NOTE — Assessment & Plan Note (Signed)
[facility]                             PULMONARY OFFICE NOTE   [name], [name]                     MRN:          [id number]  DATE:01/06/2007                            DOB:          Jan 15, 1931    PRIMARY EXTENDED FOLLOWUP OFFICE VISIT.   HISTORY:  [name] is a 77 year old black female, former smoker, seen  on January 31 for comprehensive health care evaluation with multiple  concerns raised.  One was that she was having intermittent chest pain  that was not related to exertion but migratory in nature and consistent  with irritable bowel syndrome.  I asked her to take Citrucel daily and  be on the lookout for any exertional component.  Since taking the  Citrucel, however, the chest pain has completely resolved.  She has also  had intermittent rhinitis which she controls on allergy vaccinations but  was reminded to use Zyrtec on a p.r.n. basis.  In addition, she has  hyperlipidemia and is on Vytorin with issues related to efficacy of  Vytorin discussed on that visit.  She denies any exertional chest pain,  orthopnea, PND or leg swelling, TIA or claudication symptoms since her  previous visit or muscle aches related to Vytorin.   MEDICATIONS:  For full list of medications, please see patient comp  sheet dated Jan 06, 2007.   She has been having increasing pain in her lower, back, hands and  shoulders.  Did not remember to use Aleve as recommended, but says she  has it under okay control with Tylenol and was thinking about  injections from Dr. Noel [name] again.   PHYSICAL EXAMINATION:  She is a pleasant ambulatory black female in no  acute distress.  She has stable vital signs with the blood pressure 120/76.  HEENT:  Is unremarkable.  LUNGS:  Fields are clear bilaterally to auscultation and percussion.  HEART:  Regular rate and rhythm without murmur, gallop, rub.  ABDOMEN:  Is soft, benign.  EXTREMITIES:  Warm without calf tenderness,  cyanosis, clubbing or edema.   IMPRESSION:  Discussion with this patient lasted 15 minutes during the  25-minute visit on the following topics:  1. The chest pain seems consistent with irritable bowel syndrome in      that it never occurred with exertion, was migratory and resolved      with Citrucel.  I reminded her to continue the Citrucel on a daily      basis and report back to me if she has chest pain while she is on      Citrucel.  2. Hyperlipidemia.  Her most recent lipid profile is favorable on      Vytorin and therefore will not be repeated today, since she is at      target, LDL of less than 130. I did give her samples and told her      to what to be on the lookout for in terms of side effects.  3. Chronic rhinitis with intermittent flareup.  I reminded her that      Zyrtec should be used p.r.n. for itching, sneezing  or increased      nasal congestion in the pollen season, in addition to continuing      allergy vaccinations.  4. Arthritis/back and joint pain, should be treated with Aleve as      previously recommended as long as she does not have      gastrointestinal intolerance and she should take this medication      with meals prn. If the joint pain does not improve with Aleve, then      she should see Dr. Noel [name].  I will  let Dr. Noel [name] note that this is      the case, using the theory that the least intervention that works      is the best option for the patient.  5. Obesity.  She continues to struggle with obesity issues.  We      reviewed calorie balance in the context of a normal TSH documented      on most recent evaluation.   Followup will be every 3 months, sooner if needed.     [name]. Mackenzie Sires, MD, [facility]  Electronically Signed    MBW/MedQ  DD: 01/06/2007  DT: 01/06/2007  Job #: [id number]   cc:   [name], M.D.

## 2011-01-22 NOTE — Assessment & Plan Note (Signed)
[facility]                             PULMONARY OFFICE NOTE   NAME:[name], [name]                     MRN:          [id number]  DATE:08/05/2006                            DOB:          [birth year]    HISTORY:  A 77 year old black female with obesity, hyperlipidemia,  INTOLERANT TO CRESTOR but doing well on Vytorin, with documentation of  elevated CPK levels on Crestor.  She returns complaining of aches in her  joints but not in her muscles.  She has already been seen by Dr. Noel Gerold,  who has been working on a left hip problem that he thinks may be related  to foraminal stenosis of the lumbar spine, and an MRI is scheduled for  December 5.  She has already tried Advil with no benefit.  The pain does  not radiate to her leg.   In addition to these complaints she is having increasing symptoms of  nasal congestion, itching and sneezing since August, and has not yet  tried to take Claritin, which was Dr. Roxy Cedar last recommendation.   MEDICATIONS:  For a full inventory of her medications, please see face  sheet dated August 05, 2006.   The patient denies any exertional chest pain, significant dyspnea or  wheezing during the day or night.   PHYSICAL EXAMINATION:  She is a pleasant ambulatory black female in no  acute distress.  She is afebrile, stable vital signs.  HEENT:  Remarkable for mild turbinate edema, oropharynx is clear.  NECK:  Supple without cervical adenopathy or tenderness.  The trachea  was midline, no thyromegaly.  LUNGS:  Lung fields are clear bilaterally to auscultation and  percussion.  CARDIAC:  There is a regular rhythm without murmur, gallop or rub.  ABDOMEN:  Soft, benign.  EXTREMITIES:  Warm without calf tenderness, cyanosis, clubbing or edema.  There was full range of motion of both hips.   IMPRESSION:  1. Poorly controlled rhinitis.  I recommended a 6-day course of      prednisone.  2. 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  I have      recommended she follow through on the MRI.  The prednisone for her      nose may well help her hip pain transiently.  I note that she has      failed nonsteroidal therapy, and will defer further management to      Dr. Wells Guiles capable hands.  3. Hyperlipidemia with target LDL of less than 130, with elevated CPK      on Crestor.  We will      repeat the CPK and the lipid profile on Vytorin, and arrange to see      her back for a comprehensive health care evaluation in 3 months.     [name]. Sherene Sires, MD, [facility]  Electronically Signed    MBW/MedQ  DD: 08/05/2006  DT: 08/06/2006  Job #: [id number]

## 2011-01-22 NOTE — Assessment & Plan Note (Signed)
[facility]                               PULMONARY OFFICE NOTE   NAME:WILSONFlora, [name]                     MRN:          [id number]  DATE:05/20/2006                            DOB:          September 28, 1930    PROBLEM:  Allergic rhinitis.   HISTORY:  Ms. Suniga reports some mild nasal stuffiness and minimal  increased congestion in her throat and chest associated specifically with  the rain today, otherwise she has been doing quite well.  She has had some  arthritic discomfort she associates with prominence of the right  sternoclavicular joint, but it is not effecting her breathing.  She has  chosen to continue getting her allergy vaccine at the office of Dr. Donell Sievert  at [facility] which is convenient for her.  I again suggested that she might  want to simply transfer her care to Dr Donell Sievert but she seems rather instant  on not doing so, and that she wishes to keep her medical care focused  primarily at this office.  We talked again about allergy vaccine risk, goals  and technique options.  She is now at a dilution of 1:500 and building.   MEDICATIONS:  1. Aspirin 81 mg.  2. Citrucel  3. Calcium 1000 mg.  4. Multivitamin.  5. Ginkoba.  6. Advil.   She has not been using a nasal steroid spray orZyrtecthis fall.   ALLERGIES:  There are no medication allergies.   She has not had other problems.  Dr. Sherene Sires continues to serve as her primary  physician.   OBJECTIVE:  VITAL SIGNS:  Weight 166 pounds.  Blood pressure 108/80.  Pulse  regular 63.  Room air saturation 100%.  GENERAL:  She looks quite comfortable, very alert, intelligent woman.  SKIN:  No rash: adenopathy: lymphadenopathy neck, shoulders or axilla.  ENT:  Conjunctivae are clear.  She has a little minor throat clearing but  there is pharyngeal, drainage, or erythema visible.  Voice quality is  normal.  NECK:  No striders, no neck vein distention or thyromegaly. Her nose is not  congested.   There is some relative prominence of the right sternoclavicular  joint as noted but it is not hot or apparently tender.  CHEST:  Quiet, clear lung fields.  Unlabored breathing.  No cough or wheeze.  HEART:  Heart sounds are regular without murmur, rub, or gallop.   IMPRESSION:  Allergic rhinitis.  Mild, nonspecific increased congestion  symptomatically with the rainy weather.  It does not seem to be significant  to her.   PLAN:  1. Suggested Claritin p.r.n.  2. Continue allergy vaccine, building towards an anticipated final      maintenance dose 1:50-0.5 ml per vial, once per week.  3. Schedule return 3 months, earlier as needed.                                   Clinton D. Maple Hudson, MD, FCCP, FACP   CDY/MedQ  DD:  05/21/2006  DT:  05/23/2006  Job #:  [id number]   cc:   Leandrew Koyanagi, MD

## 2011-01-22 NOTE — Assessment & Plan Note (Signed)
[facility]                             PULMONARY OFFICE NOTE   KALIYAN, OSBOURN                     MRN:          [id number]  DATE:08/05/2006                            DOB:          05/27/1931    PULMONARY/ALLERGY FOLLOWUP   PRIMARY CARE DOCTOR:  Dr. Sandrea Hughs.   HISTORY:  She continues allergy vaccine at 1:50 getting her injections  in [facility] without problem, and she is now at maintenance 1:50  getting injections once a week with no problem.  She saw the nurse  practitioner for Dr. Sherene Sires in primary care followup in mid October,  evaluating hyperlipidemia, osteoarthritis.  She had a colonoscopy with  Dr. Juanda Chance.  She has been helping her daughter at daycare and realizes  that she is exposed to those virus infections.  She has used Zyrtec or  Claritin, and asks what we have for samples.  I discussed and gave a few  samples of Xyzal 5 mg 1/2 of 1 daily to compare with those others.  We  distinguished decongestants from antihistamines.  She has a standby  prednisone prescription.  Currently, she is doing well, noticing only  minor post-nasal drainage with no sneezing, headache, significant  congestion, reflux symptoms, or wheeze.   MEDICATIONS:  1. Aspirin 81 mg.  2. Citrucel.  3. Calcium 1000 mg.  4. Multivitamin.  5. Vytorin 40/10.  6. Allergy vaccine.  7. Occasional Tylenol.   No medication allergy.   She has previously used nasal steroids, which she does not currently  feel she needs.   OBJECTIVE:  Weight 168 pounds, BP 136/84, pulse regular 60, room air  saturation 99%.  She is alert and appears comfortable.  Nose and throat are clear today.  Conjunctivae are not injected.  CHEST:  Quiet and clear with unlabored breathing.  HEART:  Sounds are regular without murmur or gallop.   IMPRESSION:  Allergic rhinitis and a little recent nasal congestion, I  think is more likely related to minimal viral congestion or possibly  simply initiation of indoor heat.   PLAN:  1. At her request, she is given samples of Xyzal 5 mg to compare with      her other antihistamines, based on discussion as above.  2. Continue allergy vaccine for now at 1:50 once a week, 0.5 ml per      vial.  3. Schedule return in 4 months, but earlier p.r.n.     Clinton D. Maple Hudson, MD, [facility], FACP  Electronically Signed    CDY/MedQ  DD: 08/05/2006  DT: 08/07/2006  Job #: [id number]   cc:   Leandrew Koyanagi

## 2011-01-22 NOTE — Assessment & Plan Note (Signed)
[facility]                           PRIMARY CARE OFFICE NOTE   NAME:[name], [name]                     MRN:          [id number]  DATE:10/06/2006                            DOB:          10-Oct-1930    PRIMARY SERVICE COMPREHENSIVE HEALTHCARE EVALUATION   HISTORY:  This is a 77 year old black female with new onset chest pain  over the last 5 months that she describes as sharp and fleeting. It  typically lasts up to 3 minutes, almost always in the sitting position.  Never occurs sleeping or with exertion. She is able to walk up to 15  minutes with no significant dyspnea or chest pain and denies any  associated dyspnea when she is having the discomfort, nor any discomfort  which is anterior. Is typically on the left, but occasionally on the  right and not associated with any nausea, vomiting, diaphoresis or pre-  syncope.   PAST MEDICAL HISTORY:  1. Hyperlipidemia for which Vytorin was necessary to get her down to      goal. NOT ABLE TO TOLERATE CRESTOR BECAUSE OF MUSCLE CRAMPING.  2. Chronic rhinitis, started on allergy vaccination May of 2007.  3. Obesity, target weight less than 146.  4. Borderline hypertension.  5. Asymptomatic swelling of the right clavicular head.   ALLERGIES:  None known.   MEDICATIONS:  Reviewed in the column dated October 06, 2006, correct as  listed. Note, that she does not use her Citrucel consistently.   SOCIAL HISTORY:  She quit smoking over 50 years ago and only smoked as a  teenager. Denies any excess alcohol use.   FAMILY HISTORY:  Is positive for colon cancer in her mother. She is the  only child and does not anything about her father's health.   REVIEW OF SYSTEMS:  Taken in detail on the worksheet. Essentially  negative except as outlined above.   PHYSICAL EXAMINATION:  This is a pleasant, ambulatory, obese black  female in no acute distress. She weighs 162 pounds at 5 feet, 1-1/2  inches, which is no  significant change from a year ago or over the last  year.  HEENT: Limited ocular examination revealed no significant retinal  arterial change. Dentition is intact. Ear canals are clear bilaterally.  She had upper and lower dentures in place and mild turbinate edema with  no cyanosis.  NECK: Supple without cervical adenopathy or tenderness. Trachea is  midline. No thyromegaly. Carotid upstrokes were brisk bilaterally and no  bruits.  LUNGS: Lung fields are completely clear bilaterally to auscultation and  percussion.  BREASTS: Were without masses, nipple or skin changes. Axillary nodes  were negative.  HEART: Regular rate and rhythm without murmur, gallop or rub.  ABDOMEN: Obese, soft, benign with no palpable organomegaly, mass or  tenderness.  EXTREMITIES: Femoral pulses were present bilaterally. Warm without calf  tenderness, cyanosis, clubbing or edema. Pedal pulses were present  bilaterally and symmetric.  NEUROLOGIC: No focal deficits, pathologic reflexes.  SKIN: Was warm and dry.  MUSCULOSKELETAL: Was unremarkable.  GYN: Per Dr. Allena Katz.   EKG was normal. LDL cholesterol was 109,  with an HDL of 75, TSH was 1.7.  CRP was 4.0. Urinalysis was unremarkable. Fasting blood sugar was 113.  CBC was normal.   IMPRESSION:  1. Fleeting chest pains that migrate sometimes on the left and      sometimes on the right and only occur sitting, never with exertion,      are almost certainly irritable bowel syndrome, especially in this      patient who has not been consistent about using the Citrucel as      directed. I have recommended that she take Citrucel perfectly      regularly and let me know if she is noticing any symptoms that      would suggest angina and I gave her very specific symptoms to look      for in that regard.  2. Chronic rhinitis with marginal control on allergy vaccinations. I      reminded her that Zyrtec is available to use on a p.r.n. basis and      there is certainly  no reason she cannot use it. One option might be      to add nasal steroids if necessary (chart review indicates she did      not respond to Singulair, but does best on Zyrtec when she      remembers to use it). I would like her to use it at bedtime on a      p.r.n. basis.  3. Hyperlipidemia, with adequate control on present regimen. We      discussed the issue about of the recent press reports on Vytorin.      There are no outcomes data to suggest that she should stop the      Vytorin and she is at target LDL level so I am going to ask her to      continue it.  4. GYN health maintenance per Dr. Allena Katz. We need to send him a copy of      the bone density study, which was favorable.  5. History of colon polyps, status post most recent colonoscopy      July 04, 2006. Documented colon polyps in a patient who has a      family history of colon cancer and is in the computer for recall.  6. Asymptomatic chronic right clavicular swelling with a negative      workup by Dr. Noel Gerold in 2003.  7. General health maintenance. She was given tetanus in 2005,      Pneumovax in 2000 and receives the flu vaccination every fall. I      reminded her regarding mammograms. I do not believe bone density is      going to be a significant value given her no risk factors and      normal previous bone densitometry, which we will share with Dr.      Allena Katz in [facility].     [name]. Sherene Sires, MD, [facility]  Electronically Signed    MBW/MedQ  DD: 10/06/2006  DT: 10/06/2006  Job #: [id number]

## 2011-01-22 NOTE — Assessment & Plan Note (Signed)
[facility]                               PULMONARY OFFICE NOTE   NAME:[name], [name]                     MRN:          [id number]  DATE:06/22/2006                            DOB:          1930-12-09    The patient is a 77 year old, African-American female patient of Dr. Thurston Hole  with a known history of hyperlipidemia, __________ , osteoarthritis, and  chronic rhinitis.  The patient presents for 9-month routine office visit.  The patient has a history of hyperlipidemia with a target LDL goal of 160.  She has previously been on Crestor and had been at goal with last total  cholesterol 208 with a LDL of 135.  However, the patient complained of some  leg cramps that she believed was related to Crestor.  She stopped Crestor  approximately for 2 weeks and leg cramps resolved.  She restarted Crestor  and leg cramps returned.  The patient also complains that she has increased  arthritic complaints along her bilateral hips, shoulders, and clavicle.  She  has previously been seen by Dr. Noel Gerold and [facility] and wishes  to return there.  She will need a referral to return there.  He had  recommended previously some possible physical therapy and different  exercises.  She would like to pursue this further with him.  Her insurance  does require a referral.  The patient denies any chest pain, shortness of  breath, abdominal pain, nausea, vomiting.   PAST MEDICAL HISTORY:  Reviewed.   CURRENT MEDICATIONS:  Reviewed.   PHYSICAL EXAMINATION:  The patient is a pleasant female in no acute  distress.  She is afebrile.  Blood pressure 144/80.  O2 saturation 98% on room air.  HEENT:  Nasal mucosa is slightly pale.  Posterior pharynx is clear.  NECK:  Supple without adenopathy.  No JVD.  Lung sounds are clear to auscultation bilaterally.  CARDIAC:  Regular rate and rhythm.  ABDOMEN:  Soft without any hepatosplenomegaly.  No guarding or rebound.  EXTREMITIES:  Warm without any calf tenderness, cyanosis, clubbing, or  edema.  Negative Homans' sign.  The patient does have some slight arthritic  changes of the hands and prominent right clavicle.  Upper and lower  extremities are equal strength.  Range of motion is normal.  The patient has  some tenderness along the bilateral shoulders and hip area.  SKIN:  Warm and dry.   IMPRESSION/PLAN:  1. Hyperlipidemia with a target LDL goal of less than 160.  The patient is      having leg cramps on Crestor.  We will check a fasting lipid panel.      The patient has been given samples of Vytorin 40/10 to begin in 1 week      after fasting lipid panel was obtained.  The patient is advised on an      exercise and low-fat, low-cholesterol diet.  2. Chronic rhinitis.  Currently well controlled on her current regimen.      Continue to followup with Dr. Maple Hudson as scheduled.  The patient does  complain of some mild intermittent dysuria episodes over the last week.      We will check an urinalysis today.  3. Osteoarthritis with worsening symptoms not well-controlled on Advil or      Tylenol.  The patient will be referred back to Dr. Noel Gerold at [facility].  4. Health maintenance.  The patient is due for a bone density.  She has a      colonoscopy      scheduled on July 04, 2006, with Dr. Lina Sar and a flu vaccine      was given today.  5. The patient will follow back up with Dr. Sherene Sires in 3 months as scheduled      or sooner if needed.      ______________________________  Rubye Oaks, NP    ______________________________  [name]. Sherene Sires, MD, [facility]     TP/MedQ  DD:  06/22/2006  DT:  06/23/2006  Job #:  [id number]

## 2011-01-22 NOTE — Op Note (Signed)
[place]  Patient:    Mackenzie, [name]                     MRN: [id number] Proc. Date: 08/09/00 Adm. Date:  [id number] Attending:  Susa Day CC:         [name]. Sherene Sires, M.D. [facility]   Operative Report  PREOPERATIVE DIAGNOSES: 1. Severe degenerative arthritis right long finger DIP joint. 2. Mucous cyst right long finger.  POSTOPERATIVE DIAGNOSES: 1. Severe degenerative arthritis right long finger DIP joint. 2. Mucous cyst right long finger.  PROCEDURE: 1. Excision of mucous cyst from distal phalangeal segment, right long finger. 2. Debride DIP joint osteophytes and removal of lose body from ulnar aspect of    DIP joint.  SURGEON:  Katy Fitch. Sypher, Montez Hageman., M.D.  ASSISTANT:  Jonni Sanger, P.A.  ANESTHESIA:  0.25% Marcaine and 1% lidocaine metacarpal head to obtain a digital block and supplemented by IV sedation.  SUPERVISING ANESTHESIOLOGIST:  Halford Decamp, M.D.  INDICATIONS:  [name] is a 77 year old woman who has had a painful arthritis develop in her right long finger, DIP joint.  She had large marginal osteophytes noted and a lose body on the ulnar aspect of the DIP joint.  She then developed a sizeable mucous cyst.  She requested excision of the cyst.  X-ray prior to surgery demonstrated the lose body and osteophytes.  We recommended joint debridement and cyst excision.  She understands that we cannot arrest progression of her osteoarthritis but can prevent the recurred formation of mucous cyst by proper joint debridement.  PROCEDURE:  [name] is brought to the operating room and placed in supine position on the operating table.  Following light sedation the right arm was prepped with Betadine soap and solution and sterilely draped.  The 0.25% Marcaine and 1% lidocaine were infiltrated in the metacarpal head level to obtain a digital block.   The finger was then exsanguinated with a gauze wrap  and a digital tourniquet placed with a Penrose drain.  The procedure commenced with a curvilinear dorsal incision centered over the cyst.  Subcutaneous tissues were carefully divided taking care to identify the margins of the cyst.  This was noted to be extending directly through the extensor mechanism at the site of the osteophyte at the base of the distal phalanx.  The cyst was circumferentially dissected and removed.  ATtention was then directed to exposing the ulnar aspect of the joints.  A triangle of capsule between the extensor tendon and the ulnar collateral ligamenT was resected with a 15 scalpel blade incision and use of a small rongeur.  The lose bodies were immediately identified and removed.  The osteophytes on the adjacent surfaces of the distal phalanx and middle phalanx were then removed with a House curet.  The joint was thoroughly debrideded and noted to have severe chondromalacia.  The wound was then repaired with interrupted sutures of 5-0 nylon.   A compressive dressing was applied with xeroflo, sterile gauze and Coban.  For after care [name] was given a prescription for Keflex 500 mg 1 p.o. q.8h. x 4 days for prophylactic antibiotic.  Also, Darvocet N100 1 or 2 tablets p.o. every 4-6 hours p.r.n. pain, #24 tablets without refill. DD:  08/09/00 TD:  08/09/00 Job: [id number]

## 2011-02-10 ENCOUNTER — Encounter: Payer: Self-pay | Admitting: Internal Medicine

## 2011-02-10 ENCOUNTER — Other Ambulatory Visit (INDEPENDENT_AMBULATORY_CARE_PROVIDER_SITE_OTHER): Payer: Medicare Other

## 2011-02-10 ENCOUNTER — Ambulatory Visit (INDEPENDENT_AMBULATORY_CARE_PROVIDER_SITE_OTHER): Payer: Medicare Other | Admitting: Internal Medicine

## 2011-02-10 ENCOUNTER — Other Ambulatory Visit: Payer: Self-pay | Admitting: Internal Medicine

## 2011-02-10 ENCOUNTER — Ambulatory Visit: Payer: Medicare Other | Admitting: Internal Medicine

## 2011-02-10 DIAGNOSIS — E785 Hyperlipidemia, unspecified: Secondary | ICD-10-CM

## 2011-02-10 DIAGNOSIS — Z0000 Encounter for general adult medical examination without abnormal findings: Secondary | ICD-10-CM

## 2011-02-10 DIAGNOSIS — M899 Disorder of bone, unspecified: Secondary | ICD-10-CM

## 2011-02-10 DIAGNOSIS — M129 Arthropathy, unspecified: Secondary | ICD-10-CM

## 2011-02-10 DIAGNOSIS — J310 Chronic rhinitis: Secondary | ICD-10-CM

## 2011-02-10 LAB — CBC WITH DIFFERENTIAL/PLATELET
Basophils Absolute: 0 10*3/uL (ref 0.0–0.1)
Basophils Relative: 0.2 % (ref 0.0–3.0)
Eosinophils Absolute: 0.1 10*3/uL (ref 0.0–0.7)
Eosinophils Relative: 2.4 % (ref 0.0–5.0)
HCT: 37.7 % (ref 36.0–46.0)
Hemoglobin: 12.7 g/dL (ref 12.0–15.0)
Lymphocytes Relative: 38.6 % (ref 12.0–46.0)
Lymphs Abs: 2.3 10*3/uL (ref 0.7–4.0)
MCHC: 33.7 g/dL (ref 30.0–36.0)
MCV: 97.2 fl (ref 78.0–100.0)
Monocytes Absolute: 0.3 10*3/uL (ref 0.1–1.0)
Monocytes Relative: 5.1 % (ref 3.0–12.0)
Neutro Abs: 3.1 10*3/uL (ref 1.4–7.7)
Neutrophils Relative %: 53.7 % (ref 43.0–77.0)
Platelets: 255 10*3/uL (ref 150.0–400.0)
RBC: 3.87 Mil/uL (ref 3.87–5.11)
RDW: 13.8 % (ref 11.5–14.6)
WBC: 5.8 10*3/uL (ref 4.5–10.5)

## 2011-02-10 LAB — HEPATIC FUNCTION PANEL
ALT: 15 U/L (ref 0–35)
AST: 18 U/L (ref 0–37)
Albumin: 4.3 g/dL (ref 3.5–5.2)
Alkaline Phosphatase: 59 U/L (ref 39–117)
Bilirubin, Direct: 0.1 mg/dL (ref 0.0–0.3)
Total Bilirubin: 0.6 mg/dL (ref 0.3–1.2)
Total Protein: 7.3 g/dL (ref 6.0–8.3)

## 2011-02-10 LAB — LIPID PANEL
Cholesterol: 222 mg/dL — ABNORMAL HIGH (ref 0–200)
HDL: 77.8 mg/dL (ref >39.00)
Total CHOL/HDL Ratio: 3
Triglycerides: 90 mg/dL (ref 0.0–149.0)
VLDL: 18 mg/dL (ref 0.0–40.0)

## 2011-02-10 LAB — BASIC METABOLIC PANEL
BUN: 16 mg/dL (ref 6–23)
CO2: 26 mEq/L (ref 19–32)
Calcium: 9.5 mg/dL (ref 8.4–10.5)
Chloride: 109 mEq/L (ref 96–112)
Creatinine, Ser: 0.9 mg/dL (ref 0.4–1.2)
GFR: 82.04 mL/min (ref >60.00)
Glucose, Bld: 96 mg/dL (ref 70–99)
Potassium: 4.5 mEq/L (ref 3.5–5.1)
Sodium: 143 mEq/L (ref 135–145)

## 2011-02-10 LAB — TSH: TSH: 2.17 u[IU]/mL (ref 0.35–5.50)

## 2011-02-10 LAB — LDL CHOLESTEROL, DIRECT: Direct LDL: 133.8 mg/dL

## 2011-02-10 NOTE — Progress Notes (Signed)
Subjective:     Patient ID: [name], female   DOB: February 08, 1931, 77 y.o.   MRN: [id number]  HPI  30 ybf remote minimal smoking hx with seasonal rhiinits flares better overall since started on immunotherapy [id number]  July 15, 2010 ov cc Nasal congestion- some better, right knee variably hurting x 3 months, minimal swelling, not sure how to use aleve but when he takes it works well. >>no changes   12/09/2010 ov f/u allergic rhinitis and hyperlipidemia, no active symptoms  rec wt loss  02/10/2011 ov/Wert cpx,  Only c/o is pain stiffness in R ring finger.  No h/o trauma, redness, swelling  Pt denies any significant sore throat, dysphagia, itching, sneezing,  nasal congestion or excess/ purulent secretions,  fever, chills, sweats, unintended wt loss, pleuritic or exertional cp, hempoptysis, orthopnea pnd or leg swelling.    Also denies any obvious fluctuation of symptoms with weather or environmental changes or other aggravating or alleviating factors.    Past Medical History:  ARTHRITIS (ICD-716.90)  Right clavicular head swelling .......................Marland KitchenDr Adria Devon Hand Pain.................................................Marland KitchenSypher RHINITIS, CHRONIC (ICD-472.0)...............................................................................Marland KitchenDr Linward Foster  - on immunotherapy since 01/2007  HYPERLIPIDEMIA (ICD-272.4)  -target LDL less than 160 > try off due to musle aches March 18, 2010 > rechallenged with half dose fall 2011 and tol ok  MORBID OBESITY (ICD-278.01)  - ideal 142 Target wt = 158 for BMI < 30  COLON POLYPS  - colonoscopy 06/24/2006 with no adenomatous change, hyperplasia only  OSTEOPENIA  -BMD 08/07/08 tspine -1.2, Left femur -.4, right femur +.2  - Repeat 01/02/2010 1.9 0.1 0.5  Health Maintenance.......................................................Marland KitchenWert  - CPX 02/10/11 - Td 09/2003  - Pneumovax 2000 age 24  - GYN [name] ([place])   Family History:  Family History  Colon Cancer in mother (in computer for recall colonoscopy 10/12)  she is an only child and knows nothing about her father's health   Social History:  Patient states former smoker quit age 88  Negative history of passive tobacco smoke exposure.  Exercise-2 times weekly  Caffeine-2 cups daily  Married with 4 children  Risk Factors:  Smoking Status: quit Age 68    Review of Systems  Constitutional: Negative for fever, chills and unexpected weight change.  HENT: Negative for ear pain, nosebleeds, congestion, sore throat, rhinorrhea, sneezing, trouble swallowing, dental problem, voice change, postnasal drip and sinus pressure.   Eyes: Negative for visual disturbance.  Respiratory: Negative for cough, choking and shortness of breath.   Cardiovascular: Negative for chest pain and leg swelling.  Gastrointestinal: Negative for vomiting, abdominal pain and diarrhea.  Genitourinary: Negative for difficulty urinating.  Musculoskeletal: Positive for arthralgias.  Skin: Negative for rash.  Neurological: Negative for tremors, syncope and headaches.  Hematological: Does not bruise/bleed easily.       Objective:   Physical Exam    wt 172 August 16, 2008 > 173 December 13, 2008 >  > 169 December 27, 2009 > 166 02/11/2011  Ambulatory healthy appearing in no acute distress.  Skin: no rash or excoriation  Afeb with normal vital signs  HEENT full: dentures, nasal mucosa clear with minor mucus bridging, no polyps. Nl external ear canals  Neck without JVD/Nodes/TM  Lungs clear to A and P bilaterally without cough on insp or exp maneuvers  RRR no s3 or murmur or increase in P2. no edema  Abd soft and benign  Ext warm without calf tenderness, cyanosis clubbing  Neuro- grossly intact to observation/ nl sensorium and gait MS  nl gait and station,  All joints ok, minimal difficulty fully extending R Ring finger  Assessment:          Plan:

## 2011-02-10 NOTE — Patient Instructions (Signed)
We will call you with lab results  If all your labs are ok we can see you again at 6 months

## 2011-02-11 ENCOUNTER — Encounter: Payer: Self-pay | Admitting: Internal Medicine

## 2011-02-11 NOTE — Assessment & Plan Note (Signed)
At goal, tol statins well

## 2011-02-11 NOTE — Assessment & Plan Note (Signed)
Progressing toward agreed goals, reinforced

## 2011-02-11 NOTE — Assessment & Plan Note (Signed)
Minimal risk of progression at this point but needs adquate D and Calcium and wt bearing ex

## 2011-02-11 NOTE — Assessment & Plan Note (Signed)
Adequate control on present rx, reviewed  

## 2011-02-16 NOTE — Progress Notes (Signed)
Quick Note:  Spoke with pt and notified of results per Dr. Wert. Pt verbalized understanding and denied any questions.  ______ 

## 2011-06-23 ENCOUNTER — Ambulatory Visit (INDEPENDENT_AMBULATORY_CARE_PROVIDER_SITE_OTHER): Payer: Medicare Other

## 2011-06-23 DIAGNOSIS — Z23 Encounter for immunization: Secondary | ICD-10-CM

## 2011-06-30 ENCOUNTER — Encounter: Payer: Medicare Other | Admitting: Internal Medicine

## 2011-08-12 ENCOUNTER — Encounter: Payer: Self-pay | Admitting: Internal Medicine

## 2011-08-25 ENCOUNTER — Other Ambulatory Visit: Payer: Self-pay | Admitting: Internal Medicine

## 2011-08-25 ENCOUNTER — Ambulatory Visit (INDEPENDENT_AMBULATORY_CARE_PROVIDER_SITE_OTHER): Payer: Medicare Other | Admitting: Internal Medicine

## 2011-08-25 ENCOUNTER — Other Ambulatory Visit (INDEPENDENT_AMBULATORY_CARE_PROVIDER_SITE_OTHER): Payer: Medicare Other

## 2011-08-25 ENCOUNTER — Encounter: Payer: Self-pay | Admitting: Internal Medicine

## 2011-08-25 VITALS — BP 118/80 | HR 68 | Temp 98.1°F | Ht 62.0 in | Wt 171.2 lb

## 2011-08-25 DIAGNOSIS — E785 Hyperlipidemia, unspecified: Secondary | ICD-10-CM

## 2011-08-25 DIAGNOSIS — M129 Arthropathy, unspecified: Secondary | ICD-10-CM

## 2011-08-25 LAB — HEPATIC FUNCTION PANEL
ALT: 18 U/L (ref 0–35)
AST: 20 U/L (ref 0–37)
Albumin: 4.1 g/dL (ref 3.5–5.2)
Alkaline Phosphatase: 61 U/L (ref 39–117)
Bilirubin, Direct: 0.1 mg/dL (ref 0.0–0.3)
Total Bilirubin: 0.8 mg/dL (ref 0.3–1.2)
Total Protein: 7.3 g/dL (ref 6.0–8.3)

## 2011-08-25 LAB — LIPID PANEL
Cholesterol: 225 mg/dL — ABNORMAL HIGH (ref 0–200)
HDL: 81.5 mg/dL (ref >39.00)
Total CHOL/HDL Ratio: 3
Triglycerides: 70 mg/dL (ref 0.0–149.0)
VLDL: 14 mg/dL (ref 0.0–40.0)

## 2011-08-25 LAB — LDL CHOLESTEROL, DIRECT: Direct LDL: 128.3 mg/dL

## 2011-08-25 LAB — TSH: TSH: 2.66 u[IU]/mL (ref 0.35–5.50)

## 2011-08-25 MED ORDER — TRAMADOL HCL 50 MG PO TABS: ORAL_TABLET | ORAL | Status: AC

## 2011-08-25 NOTE — Assessment & Plan Note (Signed)
Probable R Rotator cuff problem > ok to use tramadol for hs purposes but need to return to [facility] for further w/u

## 2011-08-25 NOTE — Patient Instructions (Addendum)
Please remember to go to the lab  department downstairs for your tests - we will call you with the results when they are available.    See Dr Noel Gerold for your shoulder pain which is consistent with a rotator cuff problem  In meantime ok to use tramadol 50 mg 1-2 every 4 hours if pain not relieved with aleve  Physical due after 02/10/2012

## 2011-08-25 NOTE — Assessment & Plan Note (Addendum)
Adequate control on present rx, reviewed  

## 2011-08-25 NOTE — Progress Notes (Signed)
Subjective:     Patient ID: [name], female   DOB: February 25, 1931, 77 y.o.   MRN: [id number]  HPI  87 ybf remote minimal smoking hx with seasonal rhiinits flares better overall since started on immunotherapy [id number]  July 15, 2010 ov cc Nasal congestion- some better, right knee variably hurting x 3 months, minimal swelling, not sure how to use aleve but when he takes it works well. >>no changes   12/09/2010 ov f/u allergic rhinitis and hyperlipidemia, no active symptoms  rec wt loss  02/10/2011 ov/Wert cpx,  Only c/o is pain stiffness in R ring finger.  No h/o trauma, redness, swelling rec No change rx    08/25/2011 f/u ov/Wert cc R shoulder pain x one year, assoc with other joints all better with aleve x for R shoulder worse, now keeping her up at night, previously injected with steroids in Jan 2012 by Noel Gerold and helped.   Pt denies any significant sore throat, dysphagia, itching, sneezing,  nasal congestion or excess/ purulent secretions,  fever, chills, sweats, unintended wt loss, pleuritic or exertional cp, hempoptysis, orthopnea pnd or leg swelling.    Also denies any obvious fluctuation of symptoms with weather or environmental changes or other aggravating or alleviating factors.    Past Medical History:  ARTHRITIS (ICD-716.90)  Right clavicular head swelling .......................Marland KitchenDr Adria Devon Hand Pain.................................................Marland KitchenSypher RHINITIS, CHRONIC (ICD-472.0)...............................................................................Marland KitchenDr Linward Foster  - on immunotherapy since 01/2007  HYPERLIPIDEMIA (ICD-272.4)  -target LDL less than 160 > try off due to musle aches March 18, 2010 > rechallenged with half dose fall 2011 and tol ok  MORBID OBESITY (ICD-278.01)  - ideal 142 Target wt = 158 for BMI < 30  COLON POLYPS  - colonoscopy 06/24/2006 with no adenomatous change, hyperplasia only  OSTEOPENIA  -BMD 08/07/08 tspine -1.2, Left femur -.4,  right femur +.2  - Repeat 01/02/2010 1.9 0.1 0.5  Health Maintenance.......................................................Marland KitchenWert  - CPX 02/10/11 - Td 09/2003  - Pneumovax 2000 age 33  - GYN [name] ([place])   Family History:  Family History Colon Cancer in mother (in computer for recall colonoscopy 10/12)  she is an only child and knows nothing about her father's health   Social History:  Patient states former smoker quit age 26  Negative history of passive tobacco smoke exposure.  Exercise-2 times weekly  Caffeine-2 cups daily  Married with 4 children  Risk Factors:  Smoking Status: quit Age 44      Objective:   Physical Exam  wt 172 August 16, 2008 > 173 December 13, 2008 > 166 02/11/2011 > 08/25/2011  171  Ambulatory healthy appearing in no acute distress.  Skin: no rash or excoriation  Afeb with normal vital signs  HEENT full: dentures, nasal mucosa clear with minor mucus bridging, no polyps. Nl external ear canals  Neck without JVD/Nodes/TM  Lungs clear to A and P bilaterally without cough on insp or exp maneuvers  RRR no s3 or murmur or increase in P2. no edema  Abd soft and benign  Ext warm without calf tenderness, cyanosis clubbing  Neuro- grossly intact to observation/ nl sensorium and gait MS nl gait and station,  Can't abduct R shoulder beyond 60deg or ext rotate against resistance  Assessment:          Plan:

## 2011-08-25 NOTE — Assessment & Plan Note (Signed)
Not Adequate control on present rx, reviewed calorie balance issues

## 2011-08-26 NOTE — Progress Notes (Signed)
Quick Note:  Spoke with pt and notified of results per Dr. Wert. Pt verbalized understanding and denied any questions.  ______ 

## 2011-09-08 DIAGNOSIS — M47812 Spondylosis without myelopathy or radiculopathy, cervical region: Secondary | ICD-10-CM | POA: Diagnosis not present

## 2011-09-08 DIAGNOSIS — M25519 Pain in unspecified shoulder: Secondary | ICD-10-CM | POA: Diagnosis not present

## 2011-09-10 ENCOUNTER — Telehealth: Payer: Self-pay | Admitting: *Deleted

## 2011-09-10 NOTE — Telephone Encounter (Signed)
I called to speak with [name] regarding labs that he is due for. Patient came on phone and states that she got letter in the mail to schedule colonoscopy. She has been set up for a colonoscopy and previsit.

## 2011-09-16 DIAGNOSIS — J309 Allergic rhinitis, unspecified: Secondary | ICD-10-CM | POA: Diagnosis not present

## 2011-10-01 ENCOUNTER — Ambulatory Visit (AMBULATORY_SURGERY_CENTER): Payer: Medicare Other | Admitting: *Deleted

## 2011-10-01 VITALS — Ht 62.0 in | Wt 166.5 lb

## 2011-10-01 DIAGNOSIS — Z1211 Encounter for screening for malignant neoplasm of colon: Secondary | ICD-10-CM | POA: Diagnosis not present

## 2011-10-01 MED ORDER — PEG-KCL-NACL-NASULF-NA ASC-C 100 G PO SOLR: ORAL | Status: DC

## 2011-10-08 ENCOUNTER — Encounter: Payer: Self-pay | Admitting: Internal Medicine

## 2011-10-08 ENCOUNTER — Ambulatory Visit (AMBULATORY_SURGERY_CENTER): Payer: Medicare Other | Admitting: Internal Medicine

## 2011-10-08 VITALS — BP 141/55 | HR 61 | Temp 96.7°F | Resp 18 | Ht 62.0 in | Wt 166.0 lb

## 2011-10-08 DIAGNOSIS — D126 Benign neoplasm of colon, unspecified: Secondary | ICD-10-CM | POA: Diagnosis not present

## 2011-10-08 DIAGNOSIS — Z1211 Encounter for screening for malignant neoplasm of colon: Secondary | ICD-10-CM

## 2011-10-08 DIAGNOSIS — Z86010 Personal history of colonic polyps: Secondary | ICD-10-CM

## 2011-10-08 MED ORDER — SODIUM CHLORIDE 0.9 % IV SOLN: 500.0000 mL | INTRAVENOUS | Status: DC

## 2011-10-08 NOTE — Op Note (Signed)
[facility] [address] [place], [place]  [address]  COLONOSCOPY PROCEDURE REPORT  PATIENT:  Cinde, Ebert  MR#:  [id number] BIRTHDATE:  1930/12/07, 80 yrs. old  GENDER:  female ENDOSCOPIST:  Hedwig Morton. Juanda Chance, MD REF. BY: PROCEDURE DATE:  10/08/2011 PROCEDURE:  Colonoscopy with biopsy ASA CLASS:  Class II INDICATIONS:  family history of colon cancer, history of hyperplastic polyps colon 2001 and 2007 MEDICATIONS:   These medications were titrated to patient response per physician's verbal order, Versed 7 mg, Fentanyl 75 mcg  DESCRIPTION OF PROCEDURE:   After the risks and benefits and of the procedure were explained, informed consent was obtained. Digital rectal exam was performed and revealed no rectal masses. The LB PCF-[id number] endoscope was introduced through the anus and advanced to the cecum, which was identified by both the appendix and ileocecal valve.  The quality of the prep was good, using MoviPrep.  The instrument was then slowly withdrawn as the colon was fully examined. <<PROCEDUREIMAGES>>  FINDINGS:  A sessile polyp was found. 4mm polyp at 20 cm The polyp was removed using cold biopsy forceps (see image2 and image3). This was otherwise a normal examination of the colon (see image4 and image1).   Retroflexed views in the rectum revealed no abnormalities.    The scope was then withdrawn from the patient and the procedure completed.  COMPLICATIONS:  None ENDOSCOPIC IMPRESSION: 1) Sessile polyp 2) Otherwise normal examination RECOMMENDATIONS: 1) Await pathology results 2) High fiber diet.  REPEAT EXAM:  In 0 year(s) for.  no recall due to age  ______________________________ Hedwig Morton. Juanda Chance, MD  CC:  Nyoka Cowden, MD  n. Rosalie DoctorHedwig Morton. Jozlin Bently at 10/08/2011 11:08 AM  Ferdie Ping, [phone]

## 2011-10-08 NOTE — Patient Instructions (Signed)
Your polyp results will be mailed to your home within two weeks.  You may resume your routine medications today.   If you have any questions, please call us at [phone].  Thank-you.

## 2011-10-08 NOTE — Progress Notes (Signed)
Patient did not have preoperative order for IV antibiotic SSI prophylaxis. (G8918)  Patient did not experience any of the following events: a burn prior to discharge; a fall within the facility; wrong site/side/patient/procedure/implant event; or a hospital transfer or hospital admission upon discharge from the facility. (G8907)  

## 2011-10-11 ENCOUNTER — Telehealth: Payer: Self-pay | Admitting: *Deleted

## 2011-10-11 NOTE — Telephone Encounter (Signed)
  Follow up Call-  Call back number 10/08/2011  Post procedure Call Back phone  # [phone]  Permission to leave phone message Yes     Patient questions:  Do you have a fever, pain , or abdominal swelling? no Pain Score  0 *  Have you tolerated food without any problems? yes  Have you been able to return to your normal activities? yes  Do you have any questions about your discharge instructions: Diet   no Medications  no Follow up visit  no  Do you have questions or concerns about your Care? no  Actions: * If pain score is 4 or above: No action needed, pain <4.

## 2011-10-12 ENCOUNTER — Encounter: Payer: Self-pay | Admitting: Internal Medicine

## 2011-10-13 DIAGNOSIS — J309 Allergic rhinitis, unspecified: Secondary | ICD-10-CM | POA: Diagnosis not present

## 2011-10-21 DIAGNOSIS — J309 Allergic rhinitis, unspecified: Secondary | ICD-10-CM | POA: Diagnosis not present

## 2011-10-28 DIAGNOSIS — J309 Allergic rhinitis, unspecified: Secondary | ICD-10-CM | POA: Diagnosis not present

## 2011-11-04 DIAGNOSIS — J309 Allergic rhinitis, unspecified: Secondary | ICD-10-CM | POA: Diagnosis not present

## 2011-11-18 DIAGNOSIS — J309 Allergic rhinitis, unspecified: Secondary | ICD-10-CM | POA: Diagnosis not present

## 2011-11-25 DIAGNOSIS — Z1231 Encounter for screening mammogram for malignant neoplasm of breast: Secondary | ICD-10-CM | POA: Diagnosis not present

## 2011-11-25 DIAGNOSIS — J309 Allergic rhinitis, unspecified: Secondary | ICD-10-CM | POA: Diagnosis not present

## 2011-12-01 DIAGNOSIS — J309 Allergic rhinitis, unspecified: Secondary | ICD-10-CM | POA: Diagnosis not present

## 2011-12-16 DIAGNOSIS — J309 Allergic rhinitis, unspecified: Secondary | ICD-10-CM | POA: Diagnosis not present

## 2011-12-22 DIAGNOSIS — H2510 Age-related nuclear cataract, unspecified eye: Secondary | ICD-10-CM | POA: Diagnosis not present

## 2011-12-23 DIAGNOSIS — J309 Allergic rhinitis, unspecified: Secondary | ICD-10-CM | POA: Diagnosis not present

## 2011-12-29 DIAGNOSIS — J309 Allergic rhinitis, unspecified: Secondary | ICD-10-CM | POA: Diagnosis not present

## 2012-01-04 DIAGNOSIS — J309 Allergic rhinitis, unspecified: Secondary | ICD-10-CM | POA: Diagnosis not present

## 2012-01-06 DIAGNOSIS — J309 Allergic rhinitis, unspecified: Secondary | ICD-10-CM | POA: Diagnosis not present

## 2012-01-10 DIAGNOSIS — J309 Allergic rhinitis, unspecified: Secondary | ICD-10-CM | POA: Diagnosis not present

## 2012-01-20 DIAGNOSIS — J309 Allergic rhinitis, unspecified: Secondary | ICD-10-CM | POA: Diagnosis not present

## 2012-02-03 DIAGNOSIS — J309 Allergic rhinitis, unspecified: Secondary | ICD-10-CM | POA: Diagnosis not present

## 2012-02-10 DIAGNOSIS — J309 Allergic rhinitis, unspecified: Secondary | ICD-10-CM | POA: Diagnosis not present

## 2012-02-17 DIAGNOSIS — J309 Allergic rhinitis, unspecified: Secondary | ICD-10-CM | POA: Diagnosis not present

## 2012-02-24 DIAGNOSIS — J309 Allergic rhinitis, unspecified: Secondary | ICD-10-CM | POA: Diagnosis not present

## 2012-03-07 DIAGNOSIS — J309 Allergic rhinitis, unspecified: Secondary | ICD-10-CM | POA: Diagnosis not present

## 2012-03-21 ENCOUNTER — Encounter: Payer: Self-pay | Admitting: Internal Medicine

## 2012-03-21 ENCOUNTER — Other Ambulatory Visit (INDEPENDENT_AMBULATORY_CARE_PROVIDER_SITE_OTHER): Payer: Medicare Other

## 2012-03-21 ENCOUNTER — Ambulatory Visit (INDEPENDENT_AMBULATORY_CARE_PROVIDER_SITE_OTHER): Payer: Medicare Other | Admitting: Internal Medicine

## 2012-03-21 VITALS — BP 138/72 | HR 71 | Temp 97.6°F | Ht 62.0 in | Wt 169.2 lb

## 2012-03-21 DIAGNOSIS — M899 Disorder of bone, unspecified: Secondary | ICD-10-CM

## 2012-03-21 DIAGNOSIS — E785 Hyperlipidemia, unspecified: Secondary | ICD-10-CM | POA: Diagnosis not present

## 2012-03-21 DIAGNOSIS — J310 Chronic rhinitis: Secondary | ICD-10-CM

## 2012-03-21 DIAGNOSIS — R300 Dysuria: Secondary | ICD-10-CM

## 2012-03-21 DIAGNOSIS — M949 Disorder of cartilage, unspecified: Secondary | ICD-10-CM

## 2012-03-21 DIAGNOSIS — M129 Arthropathy, unspecified: Secondary | ICD-10-CM

## 2012-03-21 LAB — URINALYSIS
Bilirubin Urine: NEGATIVE
Ketones, ur: NEGATIVE
Leukocytes, UA: NEGATIVE
Nitrite: NEGATIVE
Specific Gravity, Urine: 1.02 (ref 1.000–1.030)
Total Protein, Urine: NEGATIVE
Urine Glucose: NEGATIVE
Urobilinogen, UA: 0.2 (ref 0.0–1.0)
pH: 6 (ref 5.0–8.0)

## 2012-03-21 LAB — BASIC METABOLIC PANEL
BUN: 24 mg/dL — ABNORMAL HIGH (ref 6–23)
CO2: 25 mEq/L (ref 19–32)
Calcium: 9.4 mg/dL (ref 8.4–10.5)
Chloride: 110 mEq/L (ref 96–112)
Creatinine, Ser: 1 mg/dL (ref 0.4–1.2)
GFR: 66.43 mL/min (ref >60.00)
Glucose, Bld: 118 mg/dL — ABNORMAL HIGH (ref 70–99)
Potassium: 4.5 mEq/L (ref 3.5–5.1)
Sodium: 143 mEq/L (ref 135–145)

## 2012-03-21 LAB — HEPATIC FUNCTION PANEL
ALT: 19 U/L (ref 0–35)
AST: 19 U/L (ref 0–37)
Albumin: 3.9 g/dL (ref 3.5–5.2)
Alkaline Phosphatase: 56 U/L (ref 39–117)
Bilirubin, Direct: 0.1 mg/dL (ref 0.0–0.3)
Total Bilirubin: 0.5 mg/dL (ref 0.3–1.2)
Total Protein: 7 g/dL (ref 6.0–8.3)

## 2012-03-21 LAB — LIPID PANEL
Cholesterol: 234 mg/dL — ABNORMAL HIGH (ref 0–200)
HDL: 76.2 mg/dL (ref >39.00)
Total CHOL/HDL Ratio: 3
Triglycerides: 81 mg/dL (ref 0.0–149.0)
VLDL: 16.2 mg/dL (ref 0.0–40.0)

## 2012-03-21 LAB — CBC WITH DIFFERENTIAL/PLATELET
Basophils Absolute: 0 10*3/uL (ref 0.0–0.1)
Basophils Relative: 0.7 % (ref 0.0–3.0)
Eosinophils Absolute: 0.2 10*3/uL (ref 0.0–0.7)
Eosinophils Relative: 3.1 % (ref 0.0–5.0)
HCT: 38.2 % (ref 36.0–46.0)
Hemoglobin: 12.5 g/dL (ref 12.0–15.0)
Lymphocytes Relative: 37.6 % (ref 12.0–46.0)
Lymphs Abs: 2.2 10*3/uL (ref 0.7–4.0)
MCHC: 32.8 g/dL (ref 30.0–36.0)
MCV: 97.3 fl (ref 78.0–100.0)
Monocytes Absolute: 0.4 10*3/uL (ref 0.1–1.0)
Monocytes Relative: 6.4 % (ref 3.0–12.0)
Neutro Abs: 3.1 10*3/uL (ref 1.4–7.7)
Neutrophils Relative %: 52.2 % (ref 43.0–77.0)
Platelets: 236 10*3/uL (ref 150.0–400.0)
RBC: 3.93 Mil/uL (ref 3.87–5.11)
RDW: 13.7 % (ref 11.5–14.6)
WBC: 5.9 10*3/uL (ref 4.5–10.5)

## 2012-03-21 NOTE — Patient Instructions (Addendum)
Please remember to go to the lab   department downstairs for your tests - we will call you with the results when they are available.    

## 2012-03-21 NOTE — Progress Notes (Signed)
Subjective:     Patient ID: [name], female   DOB: 31-Oct-1930    MRN: [id number]  HPI  78 ybf remote minimal smoking hx with seasonal rhiinits flares better overall since started on immunotherapy [id number]  July 15, 2010 ov cc Nasal congestion- some better, right knee variably hurting x 3 months, minimal swelling, not sure how to use aleve but when he takes it works well. >>no changes   12/09/2010 ov f/u allergic rhinitis and hyperlipidemia, no active symptoms  rec wt loss  02/10/2011 ov/[name] cpx,  Only c/o is pain stiffness in R ring finger.  No h/o trauma, redness, swelling rec No change rx    08/25/2011 f/u ov/[name] cc R shoulder pain x one year, assoc with other joints all better with aleve x for R shoulder worse, now keeping her up at night, previously injected with steroids in Jan 2012 by Noel Gerold and helped. rec Please remember to go to the lab  department downstairs for your tests - we will call you with the results when they are available.    See Dr Noel Gerold for your shoulder pain which is consistent with a rotator cuff problem  In meantime ok to use tramadol 50 mg 1-2 every 4 hours if pain not relieved with aleve      03/21/2012 f/u ov/[name] for annual f/u of multiple chronic issues (hyperlipidemia, obesity, rhinitis, djd) cc L hip pain x 6 months, indolent onset, worse when walks but only every week or two and relieved by aleve quickly- rare muscle cramps also.  Sleeping ok without nocturnal  or early am exacerbation  of respiratory  c/o's  . Also denies any obvious fluctuation of symptoms with weather or environmental changes or other aggravating or alleviating factors except as outlined above    ROS  The following are not active complaints unless bolded sore throat, dysphagia, dental problems, itching, sneezing,  nasal congestion or excess/ purulent secretions, ear ache,   fever, chills, sweats, unintended wt loss, pleuritic or exertional cp, hemoptysis,  orthopnea pnd or  leg swelling, presyncope, palpitations, heartburn, abdominal pain, anorexia, nausea, vomiting, diarrhea  or change in bowel or urinary habits, change in stools or urine, dysuria,hematuria,  rash, arthralgias, visual complaints, headache, numbness weakness or ataxia or problems with walking or coordination,  change in mood/affect or memory.      Past Medical History:  ARTHRITIS (ICD-716.90)  Right clavicular head swelling .......................Marland KitchenDr Adria Devon Hand Pain.................................................Marland KitchenSypher RHINITIS, CHRONIC (ICD-472.0)...............................................................................Marland KitchenDr Linward Foster  - on immunotherapy since 01/2007  HYPERLIPIDEMIA (ICD-272.4)  -target LDL less than 160 > try off due to musle aches March 18, 2010 > rechallenged with half dose fall 2011 and tol ok  MORBID OBESITY (ICD-278.01)  - ideal 142 Target wt = 158 for BMI < 30  COLON POLYPS  - colonoscopy 06/24/2006 with no adenomatous change, hyperplasia only  OSTEOPENIA  -BMD 08/07/08 tspine -1.2, Left femur -.4, right femur +.2  - Repeat 01/02/2010 1.9 0.1 0.5  Health Maintenance.......................................................Marland KitchenWert  - CPX 02/10/11 - Td 09/2003  - Pneumovax 2000 age 78  - GYN [name] ([place])   Family History:  Family History Colon Cancer in mother > neg colon @ f/u 2013 > no further f/u per Dr Juanda Chance  she is an only child and knows nothing about her father's health   Social History:  Patient states former smoker quit age 78  Negative history of passive tobacco smoke exposure.  Exercise-2 times weekly  Caffeine-2 cups daily  Married with 4  children  Risk Factors:  Smoking Status: quit Age 78      Objective:   Physical Exam  wt 172 August 16, 2008 > 173 December 13, 2008 > 166 02/11/2011 > 08/25/2011  171 > 03/21/2012  169 Ambulatory healthy appearing amb bf in no acute distress.  Skin: no rash or excoriation  HEENT full: dentures, nasal  mucosa clear with minor mucus bridging, no polyps. Nl external ear canals  Neck without JVD/Nodes/TM  Lungs clear to A and P bilaterally without cough on insp or exp maneuvers  RRR no s3 or murmur or increase in P2. no edema  Abd soft and benign  Ext warm without calf tenderness, cyanosis clubbing  Neuro- grossly intact to observation/ nl sensorium and gait MS nl gait and station,  FROM L hip s pain elicited    Assessment:          Plan:

## 2012-03-22 ENCOUNTER — Telehealth: Payer: Self-pay | Admitting: Internal Medicine

## 2012-03-22 ENCOUNTER — Encounter: Payer: Self-pay | Admitting: Internal Medicine

## 2012-03-22 DIAGNOSIS — J309 Allergic rhinitis, unspecified: Secondary | ICD-10-CM | POA: Diagnosis not present

## 2012-03-22 LAB — LDL CHOLESTEROL, DIRECT: Direct LDL: 135.5 mg/dL

## 2012-03-22 LAB — VITAMIN D 25 HYDROXY (VIT D DEFICIENCY, FRACTURES): Vit D, 25-Hydroxy: 22 ng/mL — ABNORMAL LOW (ref 30–89)

## 2012-03-22 NOTE — Telephone Encounter (Signed)
Called and spoke with patient.  Patient inquiring about lab results.  The below results were given to patient as well as the recs. Per Dr. Sherene Sires. Patient informed that prescription would be called in to her preferred pharamacy. No further questions or concerns at this time.  Dr. Sherene Sires, there is no Vit K 50k in database to send rx, labs drawn were Vit D.... Please advise. Thanks ----- Notes Recorded by Nyoka Cowden, MD on 03/22/2012 at 8:07 AM Call patient : Study is c/w low Vit K  rx Vit K 50k weekly x 12 weeks then be sure to take 800 units otc daily thereafter and we'll recheck next ov

## 2012-03-24 LAB — TSH: TSH: 2.4 u[IU]/mL (ref 0.35–5.50)

## 2012-03-24 NOTE — Telephone Encounter (Signed)
Sorry for the confusion  Vit D level is low  Rx is Vit D 50 k weekly x 6 weeks then maintain 800 units per day thereafter

## 2012-03-24 NOTE — Telephone Encounter (Signed)
MW---did you mean the vit d was low?  Please advise. thanks

## 2012-03-24 NOTE — Telephone Encounter (Signed)
Vit K 50k  weekly

## 2012-03-24 NOTE — Telephone Encounter (Signed)
Per TP: will need clarification from Dr Sherene Sires when he returns to the office on Monday.  Thanks.

## 2012-03-25 NOTE — Assessment & Plan Note (Signed)
-  BMD 08/07/08 tspine -1.2, Left femur -.4, right femur +.2  - Repeat 01/02/2010 1.9 0.1 0.5      Vit D level 22 03/21/12 > rx vit d 50 k x 12 weeks then 800 units per day thereafter

## 2012-03-25 NOTE — Assessment & Plan Note (Signed)
Allergy immunotherapy per Dr Maple Hudson since 01/2007  Adequate control on present rx, reviewed

## 2012-03-25 NOTE — Assessment & Plan Note (Signed)
- ?   R Rotator cuff problem 08/25/2011 > referred back to [facility]  Likely now djd L hip > referred back to [facility] if need for nsaid's increases over present minimal level

## 2012-03-25 NOTE — Assessment & Plan Note (Signed)
-  target LDL less than 160 > try off due to musle aches March 18, 2010 > rechallenged with half dose fall 2011 and tol ok   Adequate control on present rx, reviewed - strongly doubt the cramps are related to zocor as they are fleeting in nature

## 2012-03-27 ENCOUNTER — Other Ambulatory Visit: Payer: Self-pay | Admitting: Internal Medicine

## 2012-03-27 MED ORDER — ERGOCALCIFEROL 1.25 MG (50000 UT) PO CAPS: 50000.0000 [IU] | ORAL_CAPSULE | ORAL | Status: DC

## 2012-03-27 NOTE — Progress Notes (Signed)
Quick Note:  Spoke with pt and notified of results per Dr. Wert. Pt verbalized understanding and denied any questions.  ______ 

## 2012-03-27 NOTE — Telephone Encounter (Signed)
Notes Recorded by Christen Butter, CMA on 03/27/2012 at 3:12 PM Spoke with pt and notified of results per Dr. Sherene Sires. Pt verbalized understanding and denied any questions.

## 2012-03-27 NOTE — Telephone Encounter (Signed)
Pt calling us back about results.  Call her back @ [phone] Leanora Ivanoff

## 2012-04-04 DIAGNOSIS — J309 Allergic rhinitis, unspecified: Secondary | ICD-10-CM | POA: Diagnosis not present

## 2012-05-04 DIAGNOSIS — J309 Allergic rhinitis, unspecified: Secondary | ICD-10-CM | POA: Diagnosis not present

## 2012-05-11 DIAGNOSIS — J309 Allergic rhinitis, unspecified: Secondary | ICD-10-CM | POA: Diagnosis not present

## 2012-05-18 DIAGNOSIS — J309 Allergic rhinitis, unspecified: Secondary | ICD-10-CM | POA: Diagnosis not present

## 2012-06-01 DIAGNOSIS — J309 Allergic rhinitis, unspecified: Secondary | ICD-10-CM | POA: Diagnosis not present

## 2012-06-07 ENCOUNTER — Ambulatory Visit (INDEPENDENT_AMBULATORY_CARE_PROVIDER_SITE_OTHER): Payer: Medicare Other

## 2012-06-07 DIAGNOSIS — Z23 Encounter for immunization: Secondary | ICD-10-CM

## 2012-06-14 DIAGNOSIS — J309 Allergic rhinitis, unspecified: Secondary | ICD-10-CM | POA: Diagnosis not present

## 2012-06-22 DIAGNOSIS — J309 Allergic rhinitis, unspecified: Secondary | ICD-10-CM | POA: Diagnosis not present

## 2012-07-06 DIAGNOSIS — J309 Allergic rhinitis, unspecified: Secondary | ICD-10-CM | POA: Diagnosis not present

## 2012-07-12 DIAGNOSIS — J309 Allergic rhinitis, unspecified: Secondary | ICD-10-CM | POA: Diagnosis not present

## 2012-07-13 DIAGNOSIS — M47817 Spondylosis without myelopathy or radiculopathy, lumbosacral region: Secondary | ICD-10-CM | POA: Diagnosis not present

## 2012-07-13 DIAGNOSIS — M25519 Pain in unspecified shoulder: Secondary | ICD-10-CM | POA: Diagnosis not present

## 2012-07-13 DIAGNOSIS — M25569 Pain in unspecified knee: Secondary | ICD-10-CM | POA: Diagnosis not present

## 2012-07-13 DIAGNOSIS — M47812 Spondylosis without myelopathy or radiculopathy, cervical region: Secondary | ICD-10-CM | POA: Diagnosis not present

## 2012-07-14 ENCOUNTER — Telehealth: Payer: Self-pay | Admitting: Internal Medicine

## 2012-07-14 NOTE — Telephone Encounter (Signed)
Reviewed labs from July 2013--per MW--pt to take the 50K weekly x 12 weeks then start on 800 units daily after this.  i have reviewed this with the pt and she is aware to start on these.  She stated that she has been stressed a little and her BP was elevated at the ortho doctor a couple of days ago and pt requested an appt with MW to discuss these issues with him.  appt has been scheduled for 11/27 at 9:45   Nothing further is needed.

## 2012-07-19 DIAGNOSIS — M719 Bursopathy, unspecified: Secondary | ICD-10-CM | POA: Diagnosis not present

## 2012-07-19 DIAGNOSIS — IMO0001 Reserved for inherently not codable concepts without codable children: Secondary | ICD-10-CM | POA: Diagnosis not present

## 2012-07-19 DIAGNOSIS — M4716 Other spondylosis with myelopathy, lumbar region: Secondary | ICD-10-CM | POA: Diagnosis not present

## 2012-07-19 DIAGNOSIS — M25519 Pain in unspecified shoulder: Secondary | ICD-10-CM | POA: Diagnosis not present

## 2012-07-19 DIAGNOSIS — M67919 Unspecified disorder of synovium and tendon, unspecified shoulder: Secondary | ICD-10-CM | POA: Diagnosis not present

## 2012-07-21 DIAGNOSIS — M4716 Other spondylosis with myelopathy, lumbar region: Secondary | ICD-10-CM | POA: Diagnosis not present

## 2012-07-21 DIAGNOSIS — M719 Bursopathy, unspecified: Secondary | ICD-10-CM | POA: Diagnosis not present

## 2012-07-21 DIAGNOSIS — M67919 Unspecified disorder of synovium and tendon, unspecified shoulder: Secondary | ICD-10-CM | POA: Diagnosis not present

## 2012-07-21 DIAGNOSIS — M25519 Pain in unspecified shoulder: Secondary | ICD-10-CM | POA: Diagnosis not present

## 2012-07-21 DIAGNOSIS — IMO0001 Reserved for inherently not codable concepts without codable children: Secondary | ICD-10-CM | POA: Diagnosis not present

## 2012-07-26 DIAGNOSIS — IMO0001 Reserved for inherently not codable concepts without codable children: Secondary | ICD-10-CM | POA: Diagnosis not present

## 2012-07-26 DIAGNOSIS — M67919 Unspecified disorder of synovium and tendon, unspecified shoulder: Secondary | ICD-10-CM | POA: Diagnosis not present

## 2012-07-26 DIAGNOSIS — M25519 Pain in unspecified shoulder: Secondary | ICD-10-CM | POA: Diagnosis not present

## 2012-07-26 DIAGNOSIS — M4716 Other spondylosis with myelopathy, lumbar region: Secondary | ICD-10-CM | POA: Diagnosis not present

## 2012-07-27 DIAGNOSIS — J309 Allergic rhinitis, unspecified: Secondary | ICD-10-CM | POA: Diagnosis not present

## 2012-07-28 DIAGNOSIS — IMO0001 Reserved for inherently not codable concepts without codable children: Secondary | ICD-10-CM | POA: Diagnosis not present

## 2012-07-28 DIAGNOSIS — M25519 Pain in unspecified shoulder: Secondary | ICD-10-CM | POA: Diagnosis not present

## 2012-07-28 DIAGNOSIS — M4716 Other spondylosis with myelopathy, lumbar region: Secondary | ICD-10-CM | POA: Diagnosis not present

## 2012-07-28 DIAGNOSIS — M67919 Unspecified disorder of synovium and tendon, unspecified shoulder: Secondary | ICD-10-CM | POA: Diagnosis not present

## 2012-07-31 DIAGNOSIS — M4716 Other spondylosis with myelopathy, lumbar region: Secondary | ICD-10-CM | POA: Diagnosis not present

## 2012-07-31 DIAGNOSIS — M719 Bursopathy, unspecified: Secondary | ICD-10-CM | POA: Diagnosis not present

## 2012-07-31 DIAGNOSIS — IMO0001 Reserved for inherently not codable concepts without codable children: Secondary | ICD-10-CM | POA: Diagnosis not present

## 2012-07-31 DIAGNOSIS — J309 Allergic rhinitis, unspecified: Secondary | ICD-10-CM | POA: Diagnosis not present

## 2012-07-31 DIAGNOSIS — M25519 Pain in unspecified shoulder: Secondary | ICD-10-CM | POA: Diagnosis not present

## 2012-07-31 DIAGNOSIS — M67919 Unspecified disorder of synovium and tendon, unspecified shoulder: Secondary | ICD-10-CM | POA: Diagnosis not present

## 2012-08-02 ENCOUNTER — Ambulatory Visit (INDEPENDENT_AMBULATORY_CARE_PROVIDER_SITE_OTHER): Payer: Medicare Other | Admitting: Internal Medicine

## 2012-08-02 ENCOUNTER — Encounter: Payer: Self-pay | Admitting: Internal Medicine

## 2012-08-02 VITALS — BP 160/82 | HR 84 | Temp 98.3°F | Ht 62.0 in | Wt 165.0 lb

## 2012-08-02 DIAGNOSIS — IMO0001 Reserved for inherently not codable concepts without codable children: Secondary | ICD-10-CM | POA: Diagnosis not present

## 2012-08-02 DIAGNOSIS — M4716 Other spondylosis with myelopathy, lumbar region: Secondary | ICD-10-CM | POA: Diagnosis not present

## 2012-08-02 DIAGNOSIS — M949 Disorder of cartilage, unspecified: Secondary | ICD-10-CM | POA: Diagnosis not present

## 2012-08-02 DIAGNOSIS — M899 Disorder of bone, unspecified: Secondary | ICD-10-CM

## 2012-08-02 DIAGNOSIS — I10 Essential (primary) hypertension: Secondary | ICD-10-CM

## 2012-08-02 DIAGNOSIS — M67919 Unspecified disorder of synovium and tendon, unspecified shoulder: Secondary | ICD-10-CM | POA: Diagnosis not present

## 2012-08-02 DIAGNOSIS — E785 Hyperlipidemia, unspecified: Secondary | ICD-10-CM

## 2012-08-02 DIAGNOSIS — M129 Arthropathy, unspecified: Secondary | ICD-10-CM

## 2012-08-02 DIAGNOSIS — M25519 Pain in unspecified shoulder: Secondary | ICD-10-CM | POA: Diagnosis not present

## 2012-08-02 DIAGNOSIS — M719 Bursopathy, unspecified: Secondary | ICD-10-CM | POA: Diagnosis not present

## 2012-08-02 MED ORDER — BISOPROLOL-HYDROCHLOROTHIAZIDE 2.5-6.25 MG PO TABS: 1.0000 | ORAL_TABLET | Freq: Every day | ORAL | Status: DC

## 2012-08-02 NOTE — Patient Instructions (Addendum)
Vit D, zocor and now ziac are maintenance medications (along with allergy shots)  Anaprox (aleve),  clariton or as needed   Return for comprehensive evaluation after 03/21/12

## 2012-08-02 NOTE — Progress Notes (Signed)
Subjective:     Patient ID: [name], female   DOB: [birth year]    MRN: [id number]  HPI  78 ybf remote minimal smoking hx with seasonal rhiinits flares better overall since started on immunotherapy [id number]  July 15, 2010 ov cc Nasal congestion- some better, right knee variably hurting x 3 months, minimal swelling, not sure how to use aleve but when he takes it works well. >>no changes   12/09/2010 ov f/u allergic rhinitis and hyperlipidemia, no active symptoms  rec wt loss  02/10/2011 ov/Zikeria Keough cpx,  Only c/o is pain stiffness in R ring finger.  No h/o trauma, redness, swelling rec No change rx    08/25/2011 f/u ov/Pocahontas Cohenour cc R shoulder pain x one year, assoc with other joints all better with aleve x for R shoulder worse, now keeping her up at night, previously injected with steroids in Jan 2012 by Noel Gerold and helped. rec Please remember to go to the lab  department downstairs for your tests - we will call you with the results when they are available. See Dr Noel Gerold for your shoulder pain which is consistent with a rotator cuff problem In meantime ok to use tramadol 50 mg 1-2 every 4 hours if pain not relieved with aleve      03/21/2012 f/u ov/Everett Ehrler for annual f/u of multiple chronic issues (hyperlipidemia, obesity, rhinitis, djd) cc L hip pain x 6 months, indolent onset, worse when walks but only every week or two and relieved by aleve quickly- rare muscle cramps also. Labs ok x vit D low > rec vit d load and increase maint to 800 units daily   08/02/2012 f/u ov/Jaydeen Odor cc Pt c/o elevated BP readings for the past month. Denies any HA/vision changes. No sob, one anaprox, no more sudafed per Bardelas on clariton   Sleeping ok without nocturnal  or early am exacerbation  of respiratory  c/o's  . Also denies any obvious fluctuation of symptoms with weather or environmental changes or other aggravating or alleviating factors except as outlined above    ROS  The following are not active  complaints unless bolded sore throat, dysphagia, dental problems, itching, sneezing,  nasal congestion or excess/ purulent secretions, ear ache,   fever, chills, sweats, unintended wt loss, pleuritic or exertional cp, hemoptysis,  orthopnea pnd or leg swelling, presyncope, palpitations, heartburn, abdominal pain, anorexia, nausea, vomiting, diarrhea  or change in bowel or urinary habits, change in stools or urine, dysuria,hematuria,  rash, arthralgias, visual complaints, headache, numbness weakness or ataxia or problems with walking or coordination,  change in mood/affect or memory.      Past Medical History:  ARTHRITIS (ICD-716.90)  Right clavicular head swelling .......................Marland KitchenDr Adria Devon Hand Pain.................................................Marland KitchenSypher RHINITIS, CHRONIC (ICD-472.0)...............................................................................Marland KitchenDr Linward Foster  - on immunotherapy since 01/2007  HYPERLIPIDEMIA (ICD-272.4)  -target LDL less than 160 > try off due to musle aches March 18, 2010 > rechallenged with half dose fall 2011 and tol ok  MORBID OBESITY (ICD-278.01)  - ideal 142 Target wt = 158 for BMI < 30  COLON POLYPS  - colonoscopy 06/24/2006 with no adenomatous change, hyperplasia only  OSTEOPENIA  -BMD 08/07/08 tspine -1.2, Left femur -.4, right femur +.2  - Repeat 01/02/2010 1.9 0.1 0.5  Health Maintenance.......................................................Marland KitchenWert  - CPX 03/21/12 - Td 09/2003  - Pneumovax 2000 age 35  - GYN [name] ([place])     Family History:  Family History Colon Cancer in mother > neg colon @ f/u 2013 > no further f/u per  Dr Juanda Chance  she is an only child and knows nothing about her father's health   Social History:  Patient states former smoker quit age 21  Negative history of passive tobacco smoke exposure.  Exercise-2 times weekly  Caffeine-2 cups daily  Married with 4 children  Risk Factors:  Smoking Status: quit Age  78      Objective:   Physical Exam  wt 172 August 16, 2008 > 173 December 13, 2008 > 166 02/11/2011 >  03/21/2012  169 > 08/02/2012  165 Ambulatory healthy appearing amb bf in no acute distress.  Skin: no rash or excoriation  HEENT full: dentures, nasal mucosa clear with minor mucus bridging, no polyps. Nl external ear canals  Neck without JVD/Nodes/TM  Lungs clear to A and P bilaterally without cough on insp or exp maneuvers  RRR no s3 or murmur or increase in P2. no edema  Abd soft and benign  Ext warm without calf tenderness, cyanosis clubbing  Neuro- grossly intact to observation/ nl sensorium and gait MS nl gait and station,  FROM L hip s pain elicited    Assessment:          Plan:

## 2012-08-03 DIAGNOSIS — I10 Essential (primary) hypertension: Secondary | ICD-10-CM | POA: Insufficient documentation

## 2012-08-03 NOTE — Assessment & Plan Note (Signed)
Discussed in detail all the  indications, usual  risks and alternatives  relative to the benefits with patient who agrees to proceed with ziac rx

## 2012-08-03 NOTE — Assessment & Plan Note (Signed)
-  target LDL less than 160 > try off due to muscle aches March 18, 2010 > rechallenged with half dose fall 2011 and tol ok   Lab Results  Component Value Date   CHOL 234* 03/21/2012   HDL 76.20 03/21/2012   LDLCALC 108* 08/11/2007   LDLDIRECT 135.5 03/21/2012   TRIG 81.0 03/21/2012   CHOLHDL 3 03/21/2012     Adequate control on present rx, reviewed

## 2012-08-03 NOTE — Assessment & Plan Note (Signed)
-  BMD 08/07/08 tspine -1.2, Left femur -.4, right femur +.2  - Repeat 01/02/2010 1.9 0.1 0.5  - Vit D level 22 03/21/12 > rx vit d 50 k x 12 weeks then 800 units per day thereafter  Adequate control on present rx, reviewed

## 2012-08-03 NOTE — Assessment & Plan Note (Signed)
Adequate control on present rx, reviewed     Each maintenance medication was reviewed in detail including most importantly the difference between maintenance and as needed and under what circumstances the prns are to be used.  Please see instructions for details which were reviewed in writing and the patient given a copy.   

## 2012-08-09 DIAGNOSIS — M719 Bursopathy, unspecified: Secondary | ICD-10-CM | POA: Diagnosis not present

## 2012-08-09 DIAGNOSIS — J309 Allergic rhinitis, unspecified: Secondary | ICD-10-CM | POA: Diagnosis not present

## 2012-08-09 DIAGNOSIS — IMO0001 Reserved for inherently not codable concepts without codable children: Secondary | ICD-10-CM | POA: Diagnosis not present

## 2012-08-09 DIAGNOSIS — M25519 Pain in unspecified shoulder: Secondary | ICD-10-CM | POA: Diagnosis not present

## 2012-08-09 DIAGNOSIS — M67919 Unspecified disorder of synovium and tendon, unspecified shoulder: Secondary | ICD-10-CM | POA: Diagnosis not present

## 2012-08-09 DIAGNOSIS — M4716 Other spondylosis with myelopathy, lumbar region: Secondary | ICD-10-CM | POA: Diagnosis not present

## 2012-08-11 DIAGNOSIS — M67919 Unspecified disorder of synovium and tendon, unspecified shoulder: Secondary | ICD-10-CM | POA: Diagnosis not present

## 2012-08-11 DIAGNOSIS — M4716 Other spondylosis with myelopathy, lumbar region: Secondary | ICD-10-CM | POA: Diagnosis not present

## 2012-08-11 DIAGNOSIS — M25519 Pain in unspecified shoulder: Secondary | ICD-10-CM | POA: Diagnosis not present

## 2012-08-11 DIAGNOSIS — IMO0001 Reserved for inherently not codable concepts without codable children: Secondary | ICD-10-CM | POA: Diagnosis not present

## 2012-08-16 DIAGNOSIS — M4716 Other spondylosis with myelopathy, lumbar region: Secondary | ICD-10-CM | POA: Diagnosis not present

## 2012-08-16 DIAGNOSIS — IMO0001 Reserved for inherently not codable concepts without codable children: Secondary | ICD-10-CM | POA: Diagnosis not present

## 2012-08-16 DIAGNOSIS — M67919 Unspecified disorder of synovium and tendon, unspecified shoulder: Secondary | ICD-10-CM | POA: Diagnosis not present

## 2012-08-16 DIAGNOSIS — M25519 Pain in unspecified shoulder: Secondary | ICD-10-CM | POA: Diagnosis not present

## 2012-08-17 DIAGNOSIS — IMO0001 Reserved for inherently not codable concepts without codable children: Secondary | ICD-10-CM | POA: Diagnosis not present

## 2012-08-17 DIAGNOSIS — M67919 Unspecified disorder of synovium and tendon, unspecified shoulder: Secondary | ICD-10-CM | POA: Diagnosis not present

## 2012-08-17 DIAGNOSIS — M719 Bursopathy, unspecified: Secondary | ICD-10-CM | POA: Diagnosis not present

## 2012-08-17 DIAGNOSIS — M4716 Other spondylosis with myelopathy, lumbar region: Secondary | ICD-10-CM | POA: Diagnosis not present

## 2012-08-17 DIAGNOSIS — M25519 Pain in unspecified shoulder: Secondary | ICD-10-CM | POA: Diagnosis not present

## 2012-08-21 DIAGNOSIS — M4716 Other spondylosis with myelopathy, lumbar region: Secondary | ICD-10-CM | POA: Diagnosis not present

## 2012-08-21 DIAGNOSIS — IMO0001 Reserved for inherently not codable concepts without codable children: Secondary | ICD-10-CM | POA: Diagnosis not present

## 2012-08-21 DIAGNOSIS — M67919 Unspecified disorder of synovium and tendon, unspecified shoulder: Secondary | ICD-10-CM | POA: Diagnosis not present

## 2012-08-21 DIAGNOSIS — M25519 Pain in unspecified shoulder: Secondary | ICD-10-CM | POA: Diagnosis not present

## 2012-08-23 DIAGNOSIS — M67919 Unspecified disorder of synovium and tendon, unspecified shoulder: Secondary | ICD-10-CM | POA: Diagnosis not present

## 2012-08-23 DIAGNOSIS — IMO0001 Reserved for inherently not codable concepts without codable children: Secondary | ICD-10-CM | POA: Diagnosis not present

## 2012-08-23 DIAGNOSIS — M4716 Other spondylosis with myelopathy, lumbar region: Secondary | ICD-10-CM | POA: Diagnosis not present

## 2012-08-23 DIAGNOSIS — J309 Allergic rhinitis, unspecified: Secondary | ICD-10-CM | POA: Diagnosis not present

## 2012-08-23 DIAGNOSIS — M25519 Pain in unspecified shoulder: Secondary | ICD-10-CM | POA: Diagnosis not present

## 2012-08-24 DIAGNOSIS — M25519 Pain in unspecified shoulder: Secondary | ICD-10-CM | POA: Diagnosis not present

## 2012-08-24 DIAGNOSIS — M47812 Spondylosis without myelopathy or radiculopathy, cervical region: Secondary | ICD-10-CM | POA: Diagnosis not present

## 2012-08-25 DIAGNOSIS — M719 Bursopathy, unspecified: Secondary | ICD-10-CM | POA: Diagnosis not present

## 2012-08-25 DIAGNOSIS — M4716 Other spondylosis with myelopathy, lumbar region: Secondary | ICD-10-CM | POA: Diagnosis not present

## 2012-08-25 DIAGNOSIS — M25519 Pain in unspecified shoulder: Secondary | ICD-10-CM | POA: Diagnosis not present

## 2012-08-25 DIAGNOSIS — M67919 Unspecified disorder of synovium and tendon, unspecified shoulder: Secondary | ICD-10-CM | POA: Diagnosis not present

## 2012-08-25 DIAGNOSIS — IMO0001 Reserved for inherently not codable concepts without codable children: Secondary | ICD-10-CM | POA: Diagnosis not present

## 2012-08-28 DIAGNOSIS — M67919 Unspecified disorder of synovium and tendon, unspecified shoulder: Secondary | ICD-10-CM | POA: Diagnosis not present

## 2012-08-28 DIAGNOSIS — M4716 Other spondylosis with myelopathy, lumbar region: Secondary | ICD-10-CM | POA: Diagnosis not present

## 2012-08-28 DIAGNOSIS — M25519 Pain in unspecified shoulder: Secondary | ICD-10-CM | POA: Diagnosis not present

## 2012-08-28 DIAGNOSIS — IMO0001 Reserved for inherently not codable concepts without codable children: Secondary | ICD-10-CM | POA: Diagnosis not present

## 2012-08-31 DIAGNOSIS — M67919 Unspecified disorder of synovium and tendon, unspecified shoulder: Secondary | ICD-10-CM | POA: Diagnosis not present

## 2012-08-31 DIAGNOSIS — M25519 Pain in unspecified shoulder: Secondary | ICD-10-CM | POA: Diagnosis not present

## 2012-08-31 DIAGNOSIS — J309 Allergic rhinitis, unspecified: Secondary | ICD-10-CM | POA: Diagnosis not present

## 2012-08-31 DIAGNOSIS — M4716 Other spondylosis with myelopathy, lumbar region: Secondary | ICD-10-CM | POA: Diagnosis not present

## 2012-08-31 DIAGNOSIS — IMO0001 Reserved for inherently not codable concepts without codable children: Secondary | ICD-10-CM | POA: Diagnosis not present

## 2012-09-04 DIAGNOSIS — IMO0001 Reserved for inherently not codable concepts without codable children: Secondary | ICD-10-CM | POA: Diagnosis not present

## 2012-09-04 DIAGNOSIS — M67919 Unspecified disorder of synovium and tendon, unspecified shoulder: Secondary | ICD-10-CM | POA: Diagnosis not present

## 2012-09-04 DIAGNOSIS — M4716 Other spondylosis with myelopathy, lumbar region: Secondary | ICD-10-CM | POA: Diagnosis not present

## 2012-09-04 DIAGNOSIS — M25519 Pain in unspecified shoulder: Secondary | ICD-10-CM | POA: Diagnosis not present

## 2012-09-05 DIAGNOSIS — IMO0001 Reserved for inherently not codable concepts without codable children: Secondary | ICD-10-CM | POA: Diagnosis not present

## 2012-09-05 DIAGNOSIS — M25519 Pain in unspecified shoulder: Secondary | ICD-10-CM | POA: Diagnosis not present

## 2012-09-05 DIAGNOSIS — M67919 Unspecified disorder of synovium and tendon, unspecified shoulder: Secondary | ICD-10-CM | POA: Diagnosis not present

## 2012-09-05 DIAGNOSIS — M719 Bursopathy, unspecified: Secondary | ICD-10-CM | POA: Diagnosis not present

## 2012-09-05 DIAGNOSIS — M4716 Other spondylosis with myelopathy, lumbar region: Secondary | ICD-10-CM | POA: Diagnosis not present

## 2012-09-13 DIAGNOSIS — M4716 Other spondylosis with myelopathy, lumbar region: Secondary | ICD-10-CM | POA: Diagnosis not present

## 2012-09-13 DIAGNOSIS — IMO0001 Reserved for inherently not codable concepts without codable children: Secondary | ICD-10-CM | POA: Diagnosis not present

## 2012-09-13 DIAGNOSIS — M67919 Unspecified disorder of synovium and tendon, unspecified shoulder: Secondary | ICD-10-CM | POA: Diagnosis not present

## 2012-09-13 DIAGNOSIS — M25519 Pain in unspecified shoulder: Secondary | ICD-10-CM | POA: Diagnosis not present

## 2012-09-13 DIAGNOSIS — J309 Allergic rhinitis, unspecified: Secondary | ICD-10-CM | POA: Diagnosis not present

## 2012-09-15 DIAGNOSIS — M25519 Pain in unspecified shoulder: Secondary | ICD-10-CM | POA: Diagnosis not present

## 2012-09-15 DIAGNOSIS — M4716 Other spondylosis with myelopathy, lumbar region: Secondary | ICD-10-CM | POA: Diagnosis not present

## 2012-09-15 DIAGNOSIS — M719 Bursopathy, unspecified: Secondary | ICD-10-CM | POA: Diagnosis not present

## 2012-09-15 DIAGNOSIS — M67919 Unspecified disorder of synovium and tendon, unspecified shoulder: Secondary | ICD-10-CM | POA: Diagnosis not present

## 2012-09-15 DIAGNOSIS — IMO0001 Reserved for inherently not codable concepts without codable children: Secondary | ICD-10-CM | POA: Diagnosis not present

## 2012-09-28 DIAGNOSIS — J309 Allergic rhinitis, unspecified: Secondary | ICD-10-CM | POA: Diagnosis not present

## 2012-10-12 DIAGNOSIS — J309 Allergic rhinitis, unspecified: Secondary | ICD-10-CM | POA: Diagnosis not present

## 2012-10-26 DIAGNOSIS — J309 Allergic rhinitis, unspecified: Secondary | ICD-10-CM | POA: Diagnosis not present

## 2012-11-01 DIAGNOSIS — J309 Allergic rhinitis, unspecified: Secondary | ICD-10-CM | POA: Diagnosis not present

## 2012-11-16 DIAGNOSIS — J309 Allergic rhinitis, unspecified: Secondary | ICD-10-CM | POA: Diagnosis not present

## 2012-11-23 DIAGNOSIS — J309 Allergic rhinitis, unspecified: Secondary | ICD-10-CM | POA: Diagnosis not present

## 2012-12-07 DIAGNOSIS — J309 Allergic rhinitis, unspecified: Secondary | ICD-10-CM | POA: Diagnosis not present

## 2012-12-12 ENCOUNTER — Other Ambulatory Visit: Payer: Self-pay | Admitting: Internal Medicine

## 2012-12-12 DIAGNOSIS — Z1231 Encounter for screening mammogram for malignant neoplasm of breast: Secondary | ICD-10-CM | POA: Diagnosis not present

## 2012-12-21 ENCOUNTER — Other Ambulatory Visit: Payer: Self-pay | Admitting: Internal Medicine

## 2012-12-21 DIAGNOSIS — J309 Allergic rhinitis, unspecified: Secondary | ICD-10-CM | POA: Diagnosis not present

## 2012-12-27 DIAGNOSIS — H2510 Age-related nuclear cataract, unspecified eye: Secondary | ICD-10-CM | POA: Diagnosis not present

## 2012-12-27 DIAGNOSIS — J309 Allergic rhinitis, unspecified: Secondary | ICD-10-CM | POA: Diagnosis not present

## 2013-01-02 DIAGNOSIS — R05 Cough: Secondary | ICD-10-CM | POA: Diagnosis not present

## 2013-01-02 DIAGNOSIS — R059 Cough, unspecified: Secondary | ICD-10-CM | POA: Diagnosis not present

## 2013-01-02 DIAGNOSIS — J309 Allergic rhinitis, unspecified: Secondary | ICD-10-CM | POA: Diagnosis not present

## 2013-01-04 DIAGNOSIS — R059 Cough, unspecified: Secondary | ICD-10-CM | POA: Diagnosis not present

## 2013-01-04 DIAGNOSIS — J309 Allergic rhinitis, unspecified: Secondary | ICD-10-CM | POA: Diagnosis not present

## 2013-01-04 DIAGNOSIS — R05 Cough: Secondary | ICD-10-CM | POA: Diagnosis not present

## 2013-01-11 ENCOUNTER — Encounter: Payer: Self-pay | Admitting: Internal Medicine

## 2013-01-11 ENCOUNTER — Ambulatory Visit (INDEPENDENT_AMBULATORY_CARE_PROVIDER_SITE_OTHER): Payer: Medicare Other | Admitting: Internal Medicine

## 2013-01-11 VITALS — BP 112/62 | HR 70 | Temp 97.4°F | Ht 62.5 in | Wt 162.0 lb

## 2013-01-11 DIAGNOSIS — I10 Essential (primary) hypertension: Secondary | ICD-10-CM

## 2013-01-11 DIAGNOSIS — J069 Acute upper respiratory infection, unspecified: Secondary | ICD-10-CM

## 2013-01-11 MED ORDER — PREDNISONE (PAK) 10 MG PO TABS: ORAL_TABLET | ORAL | Status: DC

## 2013-01-11 MED ORDER — AZITHROMYCIN 250 MG PO TABS: ORAL_TABLET | ORAL | Status: DC

## 2013-01-11 NOTE — Progress Notes (Signed)
Subjective:     Patient ID: [name], female   DOB: 04-22-1931    MRN: [id number]  HPI  89 ybf remote minimal smoking hx with seasonal rhiinits flares better overall since started on immunotherapy [id number]  July 15, 2010 ov cc Nasal congestion- some better, right knee variably hurting x 3 months, minimal swelling, not sure how to use aleve but when he takes it works well. >>no changes   12/09/2010 ov f/u allergic rhinitis and hyperlipidemia, no active symptoms  rec wt loss  02/10/2011 ov/[name] cpx,  Only c/o is pain stiffness in R ring finger.  No h/o trauma, redness, swelling rec No change rx    08/25/2011 f/u ov/[name] cc R shoulder pain x one year, assoc with other joints all better with aleve x for R shoulder worse, now keeping her up at night, previously injected with steroids in Jan 2012 by Noel Gerold and helped. rec Please remember to go to the lab  department downstairs for your tests - we will call you with the results when they are available. See Dr Noel Gerold for your shoulder pain which is consistent with a rotator cuff problem In meantime ok to use tramadol 50 mg 1-2 every 4 hours if pain not relieved with aleve    03/21/2012 f/u ov/[name] for annual f/u of multiple chronic issues (hyperlipidemia, obesity, rhinitis, djd) cc L hip pain x 6 months, indolent onset, worse when walks but only every week or two and relieved by aleve quickly- rare muscle cramps also. Labs ok x vit D low > rec vit d load and increase maint to 800 units daily   08/02/2012 f/u ov/[name] cc Pt c/o elevated BP readings for the past month. Denies any HA/vision changes. No sob, one anaprox, no more sudafed per Bardelas on clariton  01/11/2013 acute ov/[name]  Chief Complaint  Patient presents with  . Acute Visit    Pt c/o aches, HA, nasal and chest congestion x 4 days. She has had prod cough with minimal light brown to clear sputum x 2 days. Appetite is poor since symptoms started.    lots of cough but no sob. rx  robitussin some better, did  have nasal congestion > chest. Fever 5/5 > resolved, some diarrhea, min doe, no sob at rest.  Sleeping ok without nocturnal  or early am exacerbation  of respiratory  c/o's  . Also denies any obvious fluctuation of symptoms with weather or environmental changes or other aggravating or alleviating factors except as outlined above    ROS  The following are not active complaints unless bolded sore throat, dysphagia, dental problems, itching, sneezing,  nasal congestion or excess/ purulent secretions, ear ache,   fever, chills, sweats, unintended wt loss, pleuritic or exertional cp, hemoptysis,  orthopnea pnd or leg swelling, presyncope, palpitations, heartburn, abdominal pain, anorexia, nausea, vomiting, diarrhea  or change in bowel or urinary habits, change in stools or urine, dysuria,hematuria,  rash, arthralgias, visual complaints, headache, numbness weakness or ataxia or problems with walking or coordination,  change in mood/affect or memory.      Past Medical History:  ARTHRITIS (ICD-716.90)  Right clavicular head swelling .......................Marland KitchenDr Adria Devon Hand Pain.................................................Marland KitchenSypher RHINITIS, CHRONIC (ICD-472.0)...............................................................................Marland KitchenDr Linward Foster  - on immunotherapy since 01/2007  HYPERLIPIDEMIA (ICD-272.4)  -target LDL less than 160 > try off due to musle aches March 18, 2010 > rechallenged with half dose fall 2011 and tol ok  MORBID OBESITY (ICD-278.01)  - ideal 142 Target wt = 158 for BMI < 30  COLON POLYPS  - colonoscopy 06/24/2006 with no adenomatous change, hyperplasia only  OSTEOPENIA  -BMD 08/07/08 tspine -1.2, Left femur -.4, right femur +.2  - Repeat 01/02/2010 1.9 0.1 0.5  Health Maintenance.......................................................Marland KitchenWert  - CPX 03/21/12 - Td 09/2003  - Pneumovax 2000 age 35  - GYN [name] ([place])     Family History:   Family History Colon Cancer in mother > neg colon @ f/u 2013 > no further f/u per Dr Juanda Chance  she is an only child and knows nothing about her father's health   Social History:  Patient states former smoker quit age 55  Negative history of passive tobacco smoke exposure.  Exercise-2 times weekly  Caffeine-2 cups daily  Married with 4 children  Risk Factors:  Smoking Status: quit Age 89      Objective:   Physical Exam  wt 172 August 16, 2008 > 173 December 13, 2008 > 166 02/11/2011 >  03/21/2012  169 > 08/02/2012  165> 162 01/11/2013  Ambulatory min uncomfortable appearing amb bf in no acute distress, vital signs reviewed and nl.  Skin: no rash or excoriation  HEENT full: dentures, nasal mucosa clear with minor mucus bridging, no polyps. Nl external ear canals  Neck without JVD/Nodes/TM  Lungs trace bilateral late exp rhonhci RRR no s3 or murmur or increase in P2. no edema  Abd soft and benign  Ext warm without calf tenderness, cyanosis clubbing  Neuro- grossly intact to observation/ nl sensorium and gait      Assessment:          Plan:

## 2013-01-11 NOTE — Patient Instructions (Addendum)
zpak should turn the mucus clear Prednisone should eliminate the wheeze and shortness of breath  For cough use robitussin DM and if fails then mucinex dm 1200 mg every 12 hours as needed  Diet should be broth based soups, crackers and noodles, ice tea or apple juice until feel better

## 2013-01-14 DIAGNOSIS — J069 Acute upper respiratory infection, unspecified: Secondary | ICD-10-CM | POA: Insufficient documentation

## 2013-01-14 NOTE — Assessment & Plan Note (Signed)
Most likely viral  But she does still have discolored mucus and not feeling much better 5 days into her uri so rx with zpak and prednisone as she does have allergic rhinitis and trace rhonchi today suggesting mild AB too.  Reviewed with her and daughter natural hx of uri/ viral illnesses and how to treat it, what to expect in terms of symptom elimination with rx.

## 2013-01-14 NOTE — Assessment & Plan Note (Signed)
Adequate control on present rx, reviewed, no evidence of dehydration so ok to continue ziac

## 2013-02-08 DIAGNOSIS — J309 Allergic rhinitis, unspecified: Secondary | ICD-10-CM | POA: Diagnosis not present

## 2013-02-19 ENCOUNTER — Telehealth: Payer: Self-pay | Admitting: Internal Medicine

## 2013-02-19 NOTE — Telephone Encounter (Signed)
I spoke with pt. She stated she has had swelling in feet/ankles x last week. She is going on vacation next week. appt scheduled with TP on wed at 12. Nothing further was needed

## 2013-02-21 ENCOUNTER — Other Ambulatory Visit (INDEPENDENT_AMBULATORY_CARE_PROVIDER_SITE_OTHER): Payer: Medicare Other

## 2013-02-21 ENCOUNTER — Encounter: Payer: Self-pay | Admitting: Adult Health

## 2013-02-21 ENCOUNTER — Ambulatory Visit (INDEPENDENT_AMBULATORY_CARE_PROVIDER_SITE_OTHER)
Admission: RE | Admit: 2013-02-21 | Discharge: 2013-02-21 | Disposition: A | Discharge status: Home / Self Care | Payer: Medicare Other | Source: Ambulatory Visit | Attending: Adult Health | Admitting: Adult Health

## 2013-02-21 ENCOUNTER — Ambulatory Visit (INDEPENDENT_AMBULATORY_CARE_PROVIDER_SITE_OTHER): Payer: Medicare Other | Admitting: Adult Health

## 2013-02-21 VITALS — BP 160/80 | HR 82 | Temp 98.6°F | Ht 62.5 in | Wt 164.4 lb

## 2013-02-21 DIAGNOSIS — I10 Essential (primary) hypertension: Secondary | ICD-10-CM

## 2013-02-21 DIAGNOSIS — R0600 Dyspnea, unspecified: Secondary | ICD-10-CM

## 2013-02-21 DIAGNOSIS — R609 Edema, unspecified: Secondary | ICD-10-CM

## 2013-02-21 DIAGNOSIS — R0609 Other forms of dyspnea: Secondary | ICD-10-CM

## 2013-02-21 DIAGNOSIS — R0989 Other specified symptoms and signs involving the circulatory and respiratory systems: Secondary | ICD-10-CM

## 2013-02-21 LAB — CBC WITH DIFFERENTIAL/PLATELET
Basophils Absolute: 0 10*3/uL (ref 0.0–0.1)
Basophils Relative: 0.7 % (ref 0.0–3.0)
Eosinophils Absolute: 0.2 10*3/uL (ref 0.0–0.7)
Eosinophils Relative: 4.2 % (ref 0.0–5.0)
HCT: 39 % (ref 36.0–46.0)
Hemoglobin: 12.9 g/dL (ref 12.0–15.0)
Lymphocytes Relative: 36.8 % (ref 12.0–46.0)
Lymphs Abs: 1.9 10*3/uL (ref 0.7–4.0)
MCHC: 33 g/dL (ref 30.0–36.0)
MCV: 97.2 fl (ref 78.0–100.0)
Monocytes Absolute: 0.3 10*3/uL (ref 0.1–1.0)
Monocytes Relative: 6.2 % (ref 3.0–12.0)
Neutro Abs: 2.7 10*3/uL (ref 1.4–7.7)
Neutrophils Relative %: 52.1 % (ref 43.0–77.0)
Platelets: 267 10*3/uL (ref 150.0–400.0)
RBC: 4.01 Mil/uL (ref 3.87–5.11)
RDW: 14.5 % (ref 11.5–14.6)
WBC: 5.2 10*3/uL (ref 4.5–10.5)

## 2013-02-21 LAB — BASIC METABOLIC PANEL
BUN: 24 mg/dL — ABNORMAL HIGH (ref 6–23)
CO2: 24 mEq/L (ref 19–32)
Calcium: 9 mg/dL (ref 8.4–10.5)
Chloride: 103 mEq/L (ref 96–112)
Creatinine, Ser: 0.9 mg/dL (ref 0.4–1.2)
GFR: 79.48 mL/min (ref >60.00)
Glucose, Bld: 104 mg/dL — ABNORMAL HIGH (ref 70–99)
Potassium: 3.7 mEq/L (ref 3.5–5.1)
Sodium: 137 mEq/L (ref 135–145)

## 2013-02-21 LAB — BRAIN NATRIURETIC PEPTIDE: Pro B Natriuretic peptide (BNP): 55 pg/mL (ref 0.0–100.0)

## 2013-02-21 MED ORDER — BISOPROLOL-HYDROCHLOROTHIAZIDE 5-6.25 MG PO TABS: 1.0000 | ORAL_TABLET | Freq: Every day | ORAL | Status: DC

## 2013-02-21 NOTE — Progress Notes (Signed)
Subjective:     Patient ID: [name], female   DOB: [birth year]    MRN: [id number]  HPI  10 ybf remote minimal smoking hx with seasonal rhiinits flares better overall since started on immunotherapy [id number]  July 15, 2010 ov cc Nasal congestion- some better, right knee variably hurting x 3 months, minimal swelling, not sure how to use aleve but when he takes it works well. >>no changes   12/09/2010 ov f/u allergic rhinitis and hyperlipidemia, no active symptoms  rec wt loss  02/10/2011 ov/Wert cpx,  Only c/o is pain stiffness in R ring finger.  No h/o trauma, redness, swelling rec No change rx    08/25/2011 f/u ov/Wert cc R shoulder pain x one year, assoc with other joints all better with aleve x for R shoulder worse, now keeping her up at night, previously injected with steroids in Jan 2012 by Noel Gerold and helped. rec Please remember to go to the lab  department downstairs for your tests - we will call you with the results when they are available. See Dr Noel Gerold for your shoulder pain which is consistent with a rotator cuff problem In meantime ok to use tramadol 50 mg 1-2 every 4 hours if pain not relieved with aleve    03/21/2012 f/u ov/Wert for annual f/u of multiple chronic issues (hyperlipidemia, obesity, rhinitis, djd) cc L hip pain x 6 months, indolent onset, worse when walks but only every week or two and relieved by aleve quickly- rare muscle cramps also. Labs ok x vit D low > rec vit d load and increase maint to 800 units daily   08/02/2012 f/u ov/Wert cc Pt c/o elevated BP readings for the past month. Denies any HA/vision changes. No sob, one anaprox, no more sudafed per Bardelas on clariton  01/11/2013 acute ov/Wert  Chief Complaint  Patient presents with  . Acute Visit    Pt c/o aches, HA, nasal and chest congestion x 4 days. She has had prod cough with minimal light brown to clear sputum x 2 days. Appetite is poor since symptoms started.    lots of cough but no sob. rx  robitussin some better, did  have nasal congestion > chest. Fever 5/5 > resolved, some diarrhea, min doe, no sob at rest. >>  02/21/13 Acute OV  Complains of bilateral feet/leg swelling x1 week. Ankles puffy in afternoon esp.  No associated orthopnea, cough, fever , calf pain/redness, injury or travel.  B/p is elevated today, says she is taking her meds on regular basis without missed doses.  Has been using naproxen on occasion for arthritis pain in shoulders/hands /knees that is chronic.  Eating more salty foods lately. We discussed healthier choices .    ROS  The following are not active complaints unless bolded sore throat, dysphagia, dental problems, itching, sneezing,  or excess/ purulent secretions, ear ache,   fever, chills, sweats, unintended wt loss, pleuritic or exertional cp, hemoptysis,  orthopnea pnd or leg swelling, presyncope, palpitations, heartburn, abdominal pain, anorexia, nausea, vomiting, diarrhea  or change in bowel or urinary habits, change in stools or urine, dysuria,hematuria,  rash, visual complaints, , numbness weakness or ataxia or problems with walking or coordination,  change in mood/affect or memory.      Past Medical History:  ARTHRITIS (ICD-716.90)  Right clavicular head swelling .......................Marland KitchenDr Adria Devon Hand Pain.................................................Marland KitchenSypher RHINITIS, CHRONIC (ICD-472.0)...............................................................................Marland KitchenDr Linward Foster  - on immunotherapy since 01/2007  HYPERLIPIDEMIA (ICD-272.4)  -target LDL less than 160 > try off due  to musle aches March 18, 2010 > rechallenged with half dose fall 2011 and tol ok  MORBID OBESITY (ICD-278.01)  - ideal 142 Target wt = 158 for BMI < 30  COLON POLYPS  - colonoscopy 06/24/2006 with no adenomatous change, hyperplasia only  OSTEOPENIA  -BMD 08/07/08 tspine -1.2, Left femur -.4, right femur +.2  - Repeat 01/02/2010 1.9 0.1 0.5  Health  Maintenance.......................................................Marland KitchenWert  - CPX 03/21/12 - Td 09/2003  - Pneumovax 2000 age 79  - GYN [name] ([place])     Family History:  Family History Colon Cancer in mother > neg colon @ f/u 2013 > no further f/u per Dr Juanda Chance  she is an only child and knows nothing about her father's health   Social History:  Patient states former smoker quit age 67  Negative history of passive tobacco smoke exposure.  Exercise-2 times weekly  Caffeine-2 cups daily  Married with 4 children  Risk Factors:  Smoking Status: quit Age 15      Objective:   Physical Exam  wt 172 August 16, 2008 > 173 December 13, 2008 > 166 02/11/2011 >  03/21/2012  169 > 08/02/2012  165> 162 01/11/2013 >164 6/18 Ambulatory no acute distress, vital signs reviewed and nl.  Skin: no rash or excoriation  HEENT full: dentures, nasal mucosa clear with minor mucus bridging, no polyps. Nl external ear canals  Neck without JVD/Nodes/TM  Lungs diminished bs in bases  RRR no s3 or murmur or increase in P2. tr edema bilateral, mild venous insufficiency changes,   neg homans sign, no calf pain/tenderness Abd soft and benign  Ext warm without calf tenderness, cyanosis clubbing  Neuro- grossly intact to observation/ nl sensorium and gait      Assessment:          Plan:

## 2013-02-21 NOTE — Patient Instructions (Addendum)
Increase Ziac 5/ 6.25mg  daily  Low salt diet , low fat diet.  Weight loss.  I will call with lab and xray results.  Avoid NSAIDS-motrin, aleve if possilble.  Please contact office for sooner follow up if symptoms do not improve or worsen or seek emergency care  Follow up Dr. Sherene Sires  Next month for physical .

## 2013-02-23 NOTE — Progress Notes (Signed)
Quick Note:  Called spoke with patient, advised of lab results / recs as stated by TP. Pt verbalized her understanding and denied any questions. ______ 

## 2013-02-23 NOTE — Assessment & Plan Note (Addendum)
Not at goal , advised on diet -low salt- mild venous insufficiency , no evidence of fluid overload Avoidance of NSAIDS  Will check labs with bnp/bmet .  Increase HTN rx -has return ov next month for physical.   Plan  Increase Ziac 5/ 6.25mg  daily  Low salt diet , low fat diet.  Weight loss.  I will call with lab and xray results.  Avoid NSAIDS-motrin, aleve if possilble.  Please contact office for sooner follow up if symptoms do not improve or worsen or seek emergency care  Follow up Dr. Sherene Sires  Next month for physical .

## 2013-02-23 NOTE — Progress Notes (Signed)
Quick Note:  Called spoke with patient, advised of cxr results / recs as stated by TP. Pt verbalized her understanding and denied any questions. ______ 

## 2013-03-01 DIAGNOSIS — J309 Allergic rhinitis, unspecified: Secondary | ICD-10-CM | POA: Diagnosis not present

## 2013-03-15 DIAGNOSIS — J309 Allergic rhinitis, unspecified: Secondary | ICD-10-CM | POA: Diagnosis not present

## 2013-03-21 DIAGNOSIS — J309 Allergic rhinitis, unspecified: Secondary | ICD-10-CM | POA: Diagnosis not present

## 2013-03-27 ENCOUNTER — Other Ambulatory Visit (INDEPENDENT_AMBULATORY_CARE_PROVIDER_SITE_OTHER): Payer: Medicare Other

## 2013-03-27 ENCOUNTER — Encounter: Payer: Self-pay | Admitting: Internal Medicine

## 2013-03-27 ENCOUNTER — Ambulatory Visit (INDEPENDENT_AMBULATORY_CARE_PROVIDER_SITE_OTHER): Payer: Medicare Other | Admitting: Internal Medicine

## 2013-03-27 VITALS — BP 126/70 | HR 65 | Temp 97.0°F | Ht 62.25 in | Wt 163.0 lb

## 2013-03-27 DIAGNOSIS — Z23 Encounter for immunization: Secondary | ICD-10-CM | POA: Diagnosis not present

## 2013-03-27 DIAGNOSIS — E785 Hyperlipidemia, unspecified: Secondary | ICD-10-CM

## 2013-03-27 DIAGNOSIS — M949 Disorder of cartilage, unspecified: Secondary | ICD-10-CM

## 2013-03-27 DIAGNOSIS — M899 Disorder of bone, unspecified: Secondary | ICD-10-CM | POA: Diagnosis not present

## 2013-03-27 DIAGNOSIS — I10 Essential (primary) hypertension: Secondary | ICD-10-CM

## 2013-03-27 DIAGNOSIS — M129 Arthropathy, unspecified: Secondary | ICD-10-CM

## 2013-03-27 DIAGNOSIS — J310 Chronic rhinitis: Secondary | ICD-10-CM

## 2013-03-27 LAB — CBC WITH DIFFERENTIAL/PLATELET
Basophils Absolute: 0 10*3/uL (ref 0.0–0.1)
Basophils Relative: 0.6 % (ref 0.0–3.0)
Eosinophils Absolute: 0.2 10*3/uL (ref 0.0–0.7)
Eosinophils Relative: 3.1 % (ref 0.0–5.0)
HCT: 37.5 % (ref 36.0–46.0)
Hemoglobin: 12.7 g/dL (ref 12.0–15.0)
Lymphocytes Relative: 36.5 % (ref 12.0–46.0)
Lymphs Abs: 2 10*3/uL (ref 0.7–4.0)
MCHC: 33.7 g/dL (ref 30.0–36.0)
MCV: 96.6 fl (ref 78.0–100.0)
Monocytes Absolute: 0.3 10*3/uL (ref 0.1–1.0)
Monocytes Relative: 6.2 % (ref 3.0–12.0)
Neutro Abs: 2.9 10*3/uL (ref 1.4–7.7)
Neutrophils Relative %: 53.6 % (ref 43.0–77.0)
Platelets: 234 10*3/uL (ref 150.0–400.0)
RBC: 3.89 Mil/uL (ref 3.87–5.11)
RDW: 14.4 % (ref 11.5–14.6)
WBC: 5.4 10*3/uL (ref 4.5–10.5)

## 2013-03-27 LAB — BASIC METABOLIC PANEL
BUN: 25 mg/dL — ABNORMAL HIGH (ref 6–23)
CO2: 26 mEq/L (ref 19–32)
Calcium: 9.5 mg/dL (ref 8.4–10.5)
Chloride: 107 mEq/L (ref 96–112)
Creatinine, Ser: 1 mg/dL (ref 0.4–1.2)
GFR: 71.87 mL/min (ref >60.00)
Glucose, Bld: 94 mg/dL (ref 70–99)
Potassium: 3.9 mEq/L (ref 3.5–5.1)
Sodium: 140 mEq/L (ref 135–145)

## 2013-03-27 LAB — URINALYSIS, ROUTINE W REFLEX MICROSCOPIC
Bilirubin Urine: NEGATIVE
Ketones, ur: NEGATIVE
Leukocytes, UA: NEGATIVE
Nitrite: POSITIVE
RBC / HPF: NONE SEEN (ref 0–?)
Specific Gravity, Urine: 1.025 (ref 1.000–1.030)
Total Protein, Urine: NEGATIVE
Urine Glucose: NEGATIVE
Urobilinogen, UA: 0.2 (ref 0.0–1.0)
pH: 6 (ref 5.0–8.0)

## 2013-03-27 LAB — LDL CHOLESTEROL, DIRECT: Direct LDL: 200.8 mg/dL

## 2013-03-27 LAB — HEPATIC FUNCTION PANEL
ALT: 18 U/L (ref 0–35)
AST: 19 U/L (ref 0–37)
Albumin: 4 g/dL (ref 3.5–5.2)
Alkaline Phosphatase: 58 U/L (ref 39–117)
Bilirubin, Direct: 0.1 mg/dL (ref 0.0–0.3)
Total Bilirubin: 0.9 mg/dL (ref 0.3–1.2)
Total Protein: 7.1 g/dL (ref 6.0–8.3)

## 2013-03-27 LAB — LIPID PANEL
Cholesterol: 293 mg/dL — ABNORMAL HIGH (ref 0–200)
HDL: 76.4 mg/dL (ref >39.00)
Total CHOL/HDL Ratio: 4
Triglycerides: 80 mg/dL (ref 0.0–149.0)
VLDL: 16 mg/dL (ref 0.0–40.0)

## 2013-03-27 LAB — TSH: TSH: 2.06 u[IU]/mL (ref 0.35–5.50)

## 2013-03-27 NOTE — Patient Instructions (Addendum)
Please remember to go to the lab and x-ray department downstairs for your tests - we will call you with the results when they are available.     Please schedule a follow up visit in 3 months but call sooner if needed  Late add note ldl 200 will need to use a trust verify approach ? Pillboxes being used

## 2013-03-27 NOTE — Progress Notes (Signed)
Subjective:     Patient ID: [name], female   DOB: 01/31/1931    MRN: [id number]   Brief patient profile:  79  ybf remote minimal smoking hx with seasonal rhiinits flares better overall since started on immunotherapy [id number]    HPI  03/21/2012 f/u ov/[name] for annual f/u of multiple chronic issues (hyperlipidemia, obesity, rhinitis, djd) cc L hip pain x 6 months, indolent onset, worse when walks but only every week or two and relieved by aleve quickly- rare muscle cramps also. Labs ok x vit D low > rec vit d load and increase maint to 800 units daily   08/02/2012 f/u ov/[name] cc Pt c/o elevated BP readings for the past month. Denies any HA/vision changes. No sob, one anaprox, no more sudafed per [name] on clariton   02/21/13 Acute OV  Complains of bilateral feet/leg swelling x1 week. Ankles puffy in afternoon esp.  No associated orthopnea, cough, fever , calf pain/redness, injury or travel.  B/p is elevated today, says she is taking her meds on regular basis without missed doses.  Has been using naproxen on occasion for arthritis pain in shoulders/hands /knees that is chronic.  Eating more salty foods lately. We discussed healthier choices .  rec Increase Ziac 5/ 6.25mg  daily  Low salt diet , low fat diet.  Weight loss.  I will call with lab and xray results.  Avoid NSAIDS-motrin, aleve if possilble.    03/27/2013 f/u ov/[name] re hbp, djd, hyperlipidemia/ annual review Chief Complaint  Patient presents with  . Follow-up    Pt states no changes or new co's today.    no cp, tia, claudication, sob.  Sleeping ok without nocturnal  or early am exacerbation  of respiratory  c/o's or need for noct saba. Also denies any obvious fluctuation of symptoms with weather or environmental changes or other aggravating or alleviating factors except as outlined above   Current Medications, Allergies, Complete Past Medical History, Past Surgical History, Family History, and Social History were  reviewed in [name] record.  ROS  The following are not active complaints unless bolded sore throat, dysphagia, dental problems, itching, sneezing,  nasal congestion or excess/ purulent secretions, ear ache,   fever, chills, sweats, unintended wt loss, pleuritic or exertional cp, hemoptysis,  orthopnea pnd or leg swelling, presyncope, palpitations, heartburn, abdominal pain, anorexia, nausea, vomiting, diarrhea  or change in bowel or urinary habits, change in stools or urine, dysuria,hematuria,  rash, arthralgias, visual complaints, headache, numbness weakness or ataxia or problems with walking or coordination,  change in mood/affect or memory.      Past Medical History:  ARTHRITIS (ICD-716.90)  Right clavicular head swelling .......................Marland KitchenDr [name] Hand Pain.................................................Marland KitchenSypher RHINITIS, CHRONIC (ICD-472.0).................. [name]   - on immunotherapy since 01/2007  HYPERLIPIDEMIA (ICD-272.4)  -target LDL less than 160 > try off due to musle aches March 18, 2010 > rechallenged with half dose fall 2011 and tol ok  MORBID OBESITY (ICD-278.01)  - ideal 142 Target wt = 158 for BMI < 30  COLON POLYPS  - colonoscopy 06/24/2006 with no adenomatous change, hyperplasia only  OSTEOPENIA  -BMD 08/07/08 tspine -1.2, Left femur -.4, right femur +.2  - Repeat 01/02/2010 1.9 0.1 0.5  Health Maintenance.......................................................Marland KitchenWert  - CPX 03/27/2013  - Td 09/2003 > 03/27/2013  - Pneumovax 2000 age 79  - GYN [name] ([place])     Family History:  Family History Colon Cancer in mother > neg colon @ f/u  2013 > no further f/u per [name]  she is an only child and knows nothing about her father's health    Social History:  Patient states former smoker quit age 37  Negative history of passive tobacco smoke exposure.  Exercise-2 times weekly  Caffeine-2 cups daily  Married with 4  children  Risk Factors:  Smoking Status: quit Age 24      Objective:   Physical Exam  wt 172 August 16, 2008 > 173 December 13, 2008 > 166 02/11/2011 >  03/21/2012  169 > 08/02/2012  165> 162 01/11/2013 >164 6/18/ 14 > 03/27/2013  163  Ambulatory no acute distress, vital signs reviewed and nl.  Skin: no rash or excoriation  HEENT full: dentures, nasal mucosa clear with minor mucus bridging, no polyps. Nl external ear canals  Neck without JVD/Nodes/TM  Lungs diminished bs in bases  RRR no s3 or murmur or increase in P2. tr edema bilateral, mild venous insufficiency changes,   neg homans sign, no calf pain/tenderness Abd soft and benign  Ext warm without calf tenderness, cyanosis clubbing  Neuro- grossly intact to observation/ nl sensorium and gait    CXR  03/27/2013 :  did not go for cxr   Assessment:

## 2013-03-28 ENCOUNTER — Telehealth: Payer: Self-pay | Admitting: Internal Medicine

## 2013-03-28 DIAGNOSIS — J309 Allergic rhinitis, unspecified: Secondary | ICD-10-CM | POA: Diagnosis not present

## 2013-03-28 NOTE — Telephone Encounter (Signed)
Call patient : Studies show a few wbcs in urine but nothing convincing for infection - the main concern is a marked elevation in ldl > make sure she's taking zocor40 mg daily and recheck /ov with Tam in 3 months with all meds in hand  Spoke with pt and notified of results per Dr. Sherene Sires. Pt verbalized understanding and denied any questions.

## 2013-03-28 NOTE — Progress Notes (Signed)
Quick Note:  Pt aware ______ 

## 2013-03-29 NOTE — Assessment & Plan Note (Signed)
Adequate control on present rx :  reviewed each maintenance medication  in detail including most importantly the difference between maintenance and as needed and under what circumstances the prns are to be used.  Please see instructions for details which were reviewed in writing and the patient given a copy.  

## 2013-03-29 NOTE — Assessment & Plan Note (Signed)
Better on ziac 5/6.25 daily > no change rx

## 2013-03-29 NOTE — Assessment & Plan Note (Signed)
-  target LDL less than 160 > try off due to muscle aches March 18, 2010 > rechallenged with half dose fall 2011 and tol ok  She denied non compliance but her wt is well controlled so suspect she is forgetting dosing, will need to start pillbox organizer and use a trust but verify approach here.

## 2013-03-29 NOTE — Assessment & Plan Note (Signed)
Adequate control on present rx, reviewed  

## 2013-04-12 DIAGNOSIS — J309 Allergic rhinitis, unspecified: Secondary | ICD-10-CM | POA: Diagnosis not present

## 2013-04-20 DIAGNOSIS — N952 Postmenopausal atrophic vaginitis: Secondary | ICD-10-CM | POA: Diagnosis not present

## 2013-04-20 DIAGNOSIS — Z124 Encounter for screening for malignant neoplasm of cervix: Secondary | ICD-10-CM | POA: Diagnosis not present

## 2013-04-25 DIAGNOSIS — J309 Allergic rhinitis, unspecified: Secondary | ICD-10-CM | POA: Diagnosis not present

## 2013-05-02 DIAGNOSIS — J309 Allergic rhinitis, unspecified: Secondary | ICD-10-CM | POA: Diagnosis not present

## 2013-05-31 ENCOUNTER — Encounter: Payer: Self-pay | Admitting: Internal Medicine

## 2013-05-31 ENCOUNTER — Ambulatory Visit (INDEPENDENT_AMBULATORY_CARE_PROVIDER_SITE_OTHER): Payer: Medicare Other | Admitting: Internal Medicine

## 2013-05-31 VITALS — BP 126/74 | HR 68 | Temp 98.0°F | Ht 61.75 in | Wt 165.0 lb

## 2013-05-31 DIAGNOSIS — E785 Hyperlipidemia, unspecified: Secondary | ICD-10-CM | POA: Diagnosis not present

## 2013-05-31 DIAGNOSIS — J310 Chronic rhinitis: Secondary | ICD-10-CM | POA: Diagnosis not present

## 2013-05-31 DIAGNOSIS — Z23 Encounter for immunization: Secondary | ICD-10-CM

## 2013-05-31 DIAGNOSIS — I10 Essential (primary) hypertension: Secondary | ICD-10-CM

## 2013-05-31 NOTE — Progress Notes (Signed)
Subjective:     Patient ID: [name], female   DOB: 25-Dec-1930    MRN: [id number]   Brief patient profile:  79  ybf remote minimal smoking hx with seasonal rhiinits flares better overall since started on immunotherapy May2008    HPI  03/21/2012 f/u ov/[name] for annual f/u of multiple chronic issues (hyperlipidemia, obesity, rhinitis, djd) cc L hip pain x 6 months, indolent onset, worse when walks but only every week or two and relieved by aleve quickly- rare muscle cramps also. Labs ok x vit D low > rec vit d load and increase maint to 800 units daily   08/02/2012 f/u ov/[name] cc Pt c/o elevated BP readings for the past month. Denies any HA/vision changes. No sob, one anaprox, no more sudafed per Bardelas on clariton   02/21/13 Acute OV  Complains of bilateral feet/leg swelling x1 week. Ankles puffy in afternoon esp.  No associated orthopnea, cough, fever , calf pain/redness, injury or travel.  B/p is elevated today, says she is taking her meds on regular basis without missed doses.  Has been using naproxen on occasion for arthritis pain in shoulders/hands /knees that is chronic.  Eating more salty foods lately. We discussed healthier choices .  rec Increase Ziac 5/ 6.25mg  daily  Low salt diet , low fat diet.  Weight loss.  I will call with lab and xray results.  Avoid NSAIDS-motrin, aleve if possilble.    03/27/2013 f/u ov/[name] re hbp, djd, hyperlipidemia/ annual review Chief Complaint  Patient presents with  . Follow-up    Pt states no changes or new co's today.    no cp, tia, claudication, sob. rec No change rx  Late add note ldl 200 will need to use a trust verify approach ? Pillboxes being used  05/31/2013 f/u ov/[name] re: f/u lipids/bp/ cr Chief Complaint  Patient presents with  . Follow-up    Pt c/o sinus pressure, HA and PND x 2 wks. She states feels dizzy when she wakes up in the am.  She also c/o dry cough -esp in the am   no longer taking ziac, using ns and  clariton and on shots from allergy/[facility] with same symptoms that she had in spring resolved on prednisone  No obvious day to day or daytime variabilty or assoc chronic cough or sob or cp or chest tightness, subjective wheeze  Or hb symptoms. No unusual exp hx or h/o childhood pna/ asthma or knowledge of premature birth.  Sleeping ok without nocturnal  or early am exacerbation  of respiratory  c/o's or need for noct saba. Also denies any obvious fluctuation of symptoms with weather or environmental changes or other aggravating or alleviating factors except as outlined above   Current Medications, Allergies, Complete Past Medical History, Past Surgical History, Family History, and Social History were reviewed in [employer] record.  ROS  The following are not active complaints unless bolded sore throat, dysphagia, dental problems, itching, sneezing,  nasal congestion or excess/ purulent secretions, ear ache,   fever, chills, sweats, unintended wt loss, pleuritic or exertional cp, hemoptysis,  orthopnea pnd or leg swelling, presyncope, palpitations, heartburn, abdominal pain, anorexia, nausea, vomiting, diarrhea  or change in bowel or urinary habits, change in stools or urine, dysuria,hematuria,  rash, arthralgias, visual complaints, headache, numbness weakness or ataxia or problems with walking or coordination,  change in mood/affect or memory.        Past Medical History:  ARTHRITIS (ICD-716.90)  Right clavicular head  swelling .......................Marland KitchenDr Adria Devon Hand Pain.................................................Marland KitchenSypher RHINITIS, CHRONIC (ICD-472.0).................. [facility]   - on immunotherapy since 01/2007  HYPERLIPIDEMIA (ICD-272.4)  -target LDL less than 160 > try off due to musle aches March 18, 2010 > rechallenged with half dose fall 2011 and tol ok  MORBID OBESITY (ICD-278.01)  - ideal 142 Target wt = 158 for BMI < 30  COLON POLYPS  - colonoscopy  06/24/2006 with no adenomatous change, hyperplasia only  OSTEOPENIA  -BMD 08/07/08 tspine -1.2, Left femur -.4, right femur +.2  - Repeat 01/02/2010 1.9 0.1 0.5  Health Maintenance.......................................................Marland KitchenWert  - CPX 03/27/2013  - Td 09/2003 > 03/27/2013  - Pneumovax 2000 age 27  - GYN [name] ([place])     Family History:  Family History Colon Cancer in mother > neg colon @ f/u 2013 > no further f/u per Dr Juanda Chance  she is an only child and knows nothing about her father's health    Social History:  Patient states former smoker quit age 34  Negative history of passive tobacco smoke exposure.  Exercise-2 times weekly  Caffeine-2 cups daily  Married with 4 children  Risk Factors:  Smoking Status: quit Age 79      Objective:   Physical Exam  wt 172 August 16, 2008 > 173 December 13, 2008 > 166 02/11/2011 >  03/21/2012  169 > 08/02/2012  165> 162 01/11/2013 >164 6/18/ 14 > 03/27/2013  163  > 05/31/2013 165 Ambulatory no acute distress, vital signs reviewed and nl.  Skin: no rash or excoriation  HEENT full: dentures, nasal mucosa clear with moderate non-specific turbinate edeam,  no polyps. Nl external ear canals  Neck without JVD/Nodes/TM  Lungs clear  To a and p  RRR no s3 or murmur or increase in P2. tr edema bilateral, mild venous insufficiency changes,   neg homans sign, no calf pain/tenderness Abd soft and benign  Ext warm without calf tenderness, cyanosis clubbing       CXR  03/27/2013 :  did not go for cxr   Assessment:

## 2013-05-31 NOTE — Assessment & Plan Note (Signed)
Allergy immunotherapy per Dr Maple Hudson since 01/2007 > changed to [facility] allergy/Bardelas 2014  Acute flare with nasal obst symptoms but no evidence of infection so rx with Zyrtec / pred x 6 days/ NS

## 2013-05-31 NOTE — Assessment & Plan Note (Signed)
Not clear she needs any rx at present > try off

## 2013-05-31 NOTE — Patient Instructions (Addendum)
Prednisone 10 mg take  4 each am x 2 days,   2 each am x 2 days,  1 each am x 2 days and stop   Try stop clariton and take zyrtec 10 mg one at bedtime in its place to see if helps more with your nasal symptoms  Continue the saline nasal irrigation  Increase zocor to 40 mg daily if tolerated  See Tammy NP in 4 weeks with all your medications, even over the counter meds, separated in two separate bags, the ones you take no matter what vs the ones you stop once you feel better and take only as needed when you feel you need them.   Tammy  will generate for you a new user friendly medication calendar that will put [name] all on the same page re: your medication use.     Without this process, it simply isn't possible to assure that we are providing  your outpatient care  with  the attention to detail we feel you deserve.   If we cannot assure that you're getting that kind of care,  then we cannot manage your problem effectively from this clinic.  Once you have seen Tammy and we are sure that we're all on the same page with your medication use she will arrange follow up with me.

## 2013-05-31 NOTE — Assessment & Plan Note (Signed)
Target < 160 LDL not attained on zocor 40 mg one half daily but we may not have full adeherence here   To keep things simple, I have asked the patient to first separate medicines that are perceived as maintenance, that is to be taken daily "no matter what", from those medicines that are taken on only on an as-needed basis and I have given the patient examples of both, and then return to see our NP to generate a  detailed  medication calendar which should be followed until the next physician sees the patient and updates it.

## 2013-06-01 ENCOUNTER — Telehealth: Payer: Self-pay | Admitting: Internal Medicine

## 2013-06-01 MED ORDER — PREDNISONE 10 MG PO TABS: ORAL_TABLET | ORAL | Status: DC

## 2013-06-01 NOTE — Telephone Encounter (Signed)
Pred 10mg  taper sent to [facility] [place] [facility] Per instructions at 9/25 OV--- 4 x 2 days, 2 x 2 days, 1 x 2 days then stop Pt aware.

## 2013-06-21 DIAGNOSIS — J309 Allergic rhinitis, unspecified: Secondary | ICD-10-CM | POA: Diagnosis not present

## 2013-06-27 ENCOUNTER — Encounter: Payer: Medicare Other | Admitting: Adult Health

## 2013-06-27 DIAGNOSIS — J309 Allergic rhinitis, unspecified: Secondary | ICD-10-CM | POA: Diagnosis not present

## 2013-07-04 ENCOUNTER — Ambulatory Visit (INDEPENDENT_AMBULATORY_CARE_PROVIDER_SITE_OTHER): Payer: Medicare Other | Admitting: Adult Health

## 2013-07-04 ENCOUNTER — Other Ambulatory Visit (INDEPENDENT_AMBULATORY_CARE_PROVIDER_SITE_OTHER): Payer: Medicare Other

## 2013-07-04 ENCOUNTER — Encounter: Payer: Self-pay | Admitting: Adult Health

## 2013-07-04 VITALS — BP 130/82 | HR 68 | Temp 97.2°F | Ht 61.75 in | Wt 167.6 lb

## 2013-07-04 DIAGNOSIS — E785 Hyperlipidemia, unspecified: Secondary | ICD-10-CM | POA: Diagnosis not present

## 2013-07-04 DIAGNOSIS — J309 Allergic rhinitis, unspecified: Secondary | ICD-10-CM | POA: Diagnosis not present

## 2013-07-04 LAB — HEPATIC FUNCTION PANEL
ALT: 17 U/L (ref 0–35)
AST: 16 U/L (ref 0–37)
Albumin: 3.9 g/dL (ref 3.5–5.2)
Alkaline Phosphatase: 59 U/L (ref 39–117)
Bilirubin, Direct: 0.1 mg/dL (ref 0.0–0.3)
Total Bilirubin: 0.7 mg/dL (ref 0.3–1.2)
Total Protein: 7.2 g/dL (ref 6.0–8.3)

## 2013-07-04 LAB — LDL CHOLESTEROL, DIRECT: Direct LDL: 147.8 mg/dL

## 2013-07-04 LAB — LIPID PANEL
Cholesterol: 237 mg/dL — ABNORMAL HIGH (ref 0–200)
HDL: 80.8 mg/dL (ref >39.00)
Total CHOL/HDL Ratio: 3
Triglycerides: 80 mg/dL (ref 0.0–149.0)
VLDL: 16 mg/dL (ref 0.0–40.0)

## 2013-07-04 NOTE — Patient Instructions (Signed)
Follow med calendar closely and bring to each visit.  Low salt diet , low fat diet.  I will call with lab results.  follow up Dr. Sherene Sires in 3 months and As needed   Please contact office for sooner follow up if symptoms do not improve or worsen or seek emergency care

## 2013-07-05 NOTE — Assessment & Plan Note (Signed)
Not at goal >zocor increased 40mg  05/31/13  Advised on diet  Check lipid panel today  Patient's medications were reviewed today and patient education was given. Computerized medication calendar was adjusted/completed   Plan  Follow med calendar closely and bring to each visit.  Low salt diet , low fat diet.  I will call with lab results.  follow up Dr. Sherene Sires in 3 months and As needed   Please contact office for sooner follow up if symptoms do not improve or worsen or seek emergency care

## 2013-07-05 NOTE — Progress Notes (Signed)
Subjective:     Patient ID: [name], female   DOB: 13-Dec-1930    MRN: [id number]   Brief patient profile:  79  ybf remote minimal smoking hx with seasonal rhiinits flares better overall since started on immunotherapy May2008    HPI  03/21/2012 f/u ov/Wert for annual f/u of multiple chronic issues (hyperlipidemia, obesity, rhinitis, djd) cc L hip pain x 6 months, indolent onset, worse when walks but only every week or two and relieved by aleve quickly- rare muscle cramps also. Labs ok x vit D low > rec vit d load and increase maint to 800 units daily   08/02/2012 f/u ov/Wert cc Pt c/o elevated BP readings for the past month. Denies any HA/vision changes. No sob, one anaprox, no more sudafed per Bardelas on clariton   02/21/13 Acute OV  Complains of bilateral feet/leg swelling x1 week. Ankles puffy in afternoon esp.  No associated orthopnea, cough, fever , calf pain/redness, injury or travel.  B/p is elevated today, says she is taking her meds on regular basis without missed doses.  Has been using naproxen on occasion for arthritis pain in shoulders/hands /knees that is chronic.  Eating more salty foods lately. We discussed healthier choices .  rec Increase Ziac 5/ 6.25mg  daily  Low salt diet , low fat diet.  Weight loss.  I will call with lab and xray results.  Avoid NSAIDS-motrin, aleve if possilble.    03/27/2013 f/u ov/Wert re hbp, djd, hyperlipidemia/ annual review Chief Complaint  Patient presents with  . Follow-up    Pt states no changes or new co's today.    no cp, tia, claudication, sob. rec No change rx  Late add note ldl 200 will need to use a trust verify approach ? Pillboxes being used  05/31/2013 f/u ov/Wert re: f/u lipids/bp/ cr Chief Complaint  Patient presents with  . Follow-up    Pt c/o sinus pressure, HA and PND x 2 wks. She states feels dizzy when she wakes up in the am.  She also c/o dry cough -esp in the am   no longer taking ziac, using ns and  clariton and on shots from allergy/HP with same symptoms that she had in spring resolved on prednisone  >pred pack and zyrtec  Increased zocor to 40mg    07/05/2013 Follow up and med review  Returns for follow up and med review  Was seen last month with AR flare , tx w/ zyrtec and steroid taper. Says she is much better w/ resolved symptoms.  We reviewed all her meds and organized them into a med calendar w/ pt education. Appears to be taking meds correctly.  Zocor was increased to 40mg  as LDL was not at goal . She is fasting today.  No fever , chest pain, orthopnea, or edema.    Current Medications, Allergies, Complete Past Medical History, Past Surgical History, Family History, and Social History were reviewed in [employer] record.  ROS  The following are not active complaints unless bolded sore throat, dysphagia, dental problems, itching, sneezing,  nasal congestion or excess/ purulent secretions, ear ache,   fever, chills, sweats, unintended wt loss, pleuritic or exertional cp, hemoptysis,  orthopnea pnd or leg swelling, presyncope, palpitations, heartburn, abdominal pain, anorexia, nausea, vomiting, diarrhea  or change in bowel or urinary habits, change in stools or urine, dysuria,hematuria,  rash, arthralgias, visual complaints, headache, numbness weakness or ataxia or problems with walking or coordination,  change in mood/affect or memory.  Past Medical History:  ARTHRITIS (ICD-716.90)  Right clavicular head swelling .......................Marland KitchenDr Adria Devon Hand Pain.................................................Marland KitchenSypher RHINITIS, CHRONIC (ICD-472.0).................. [facility]   - on immunotherapy since 01/2007  HYPERLIPIDEMIA (ICD-272.4)  -target LDL less than 160 > try off due to musle aches March 18, 2010 > rechallenged with half dose fall 2011 and tol ok  MORBID OBESITY (ICD-278.01)  - ideal 142 Target wt = 158 for BMI < 30  COLON POLYPS  -  colonoscopy 06/24/2006 with no adenomatous change, hyperplasia only  OSTEOPENIA  -BMD 08/07/08 tspine -1.2, Left femur -.4, right femur +.2  - Repeat 01/02/2010 1.9 0.1 0.5  Health Maintenance.......................................................Marland KitchenWert  - CPX 03/27/2013  - Td 09/2003 > 03/27/2013  - Pneumovax 2000 age 79  - GYN [name] ([facility])  -Med calendar 07/05/2013     Family History:  Family History Colon Cancer in mother > neg colon @ f/u 2013 > no further f/u per Dr Juanda Chance  she is an only child and knows nothing about her father's health    Social History:  Patient states former smoker quit age 57  Negative history of passive tobacco smoke exposure.  Exercise-2 times weekly  Caffeine-2 cups daily  Married with 4 children  Risk Factors:  Smoking Status: quit Age 9      Objective:   Physical Exam  wt 172 August 16, 2008 > 173 December 13, 2008 > 166 02/11/2011 >  03/21/2012  169 > 08/02/2012  165> 162 01/11/2013 >164 6/18/ 14 > 03/27/2013  163  > 05/31/2013 165>167 07/05/2013  Ambulatory no acute distress, vital signs reviewed and nl.  Skin: no rash or excoriation  HEENT full: dentures, nasal mucosa clear with moderate non-specific turbinate edeam,  no polyps. Nl external ear canals  Neck without JVD/Nodes/TM  Lungs clear  To a and p  RRR no s3 or murmur or increase in P2. tr edema bilateral, mild venous insufficiency changes,    no calf pain/tenderness Abd soft and benign  Ext warm without calf tenderness, cyanosis clubbing         Assessment:

## 2013-07-06 NOTE — Progress Notes (Signed)
Quick Note:  LMOM TCB x1. ______ 

## 2013-07-09 ENCOUNTER — Telehealth: Payer: Self-pay | Admitting: Internal Medicine

## 2013-07-09 NOTE — Telephone Encounter (Signed)
I spoke with patient about results and she verbalized understanding and had no questions 

## 2013-07-12 DIAGNOSIS — J309 Allergic rhinitis, unspecified: Secondary | ICD-10-CM | POA: Diagnosis not present

## 2013-07-12 NOTE — Addendum Note (Signed)
Addended by: Boone Master E on: 07/12/2013 12:51 PM   Modules accepted: Orders

## 2013-07-19 DIAGNOSIS — J309 Allergic rhinitis, unspecified: Secondary | ICD-10-CM | POA: Diagnosis not present

## 2013-07-26 DIAGNOSIS — J309 Allergic rhinitis, unspecified: Secondary | ICD-10-CM | POA: Diagnosis not present

## 2013-07-27 DIAGNOSIS — J309 Allergic rhinitis, unspecified: Secondary | ICD-10-CM | POA: Diagnosis not present

## 2013-07-30 DIAGNOSIS — J309 Allergic rhinitis, unspecified: Secondary | ICD-10-CM | POA: Diagnosis not present

## 2013-08-01 DIAGNOSIS — L821 Other seborrheic keratosis: Secondary | ICD-10-CM | POA: Diagnosis not present

## 2013-08-21 DIAGNOSIS — J309 Allergic rhinitis, unspecified: Secondary | ICD-10-CM | POA: Diagnosis not present

## 2013-09-27 DIAGNOSIS — J309 Allergic rhinitis, unspecified: Secondary | ICD-10-CM | POA: Diagnosis not present

## 2013-10-04 DIAGNOSIS — J309 Allergic rhinitis, unspecified: Secondary | ICD-10-CM | POA: Diagnosis not present

## 2013-10-22 DIAGNOSIS — J309 Allergic rhinitis, unspecified: Secondary | ICD-10-CM | POA: Diagnosis not present

## 2013-11-08 DIAGNOSIS — J309 Allergic rhinitis, unspecified: Secondary | ICD-10-CM | POA: Diagnosis not present

## 2013-11-15 DIAGNOSIS — J309 Allergic rhinitis, unspecified: Secondary | ICD-10-CM | POA: Diagnosis not present

## 2013-11-21 DIAGNOSIS — J309 Allergic rhinitis, unspecified: Secondary | ICD-10-CM | POA: Diagnosis not present

## 2013-12-05 DIAGNOSIS — J309 Allergic rhinitis, unspecified: Secondary | ICD-10-CM | POA: Diagnosis not present

## 2013-12-06 DIAGNOSIS — J309 Allergic rhinitis, unspecified: Secondary | ICD-10-CM | POA: Diagnosis not present

## 2013-12-12 DIAGNOSIS — J309 Allergic rhinitis, unspecified: Secondary | ICD-10-CM | POA: Diagnosis not present

## 2013-12-14 DIAGNOSIS — Z1231 Encounter for screening mammogram for malignant neoplasm of breast: Secondary | ICD-10-CM | POA: Diagnosis not present

## 2013-12-17 ENCOUNTER — Encounter: Payer: Self-pay | Admitting: Internal Medicine

## 2013-12-17 DIAGNOSIS — Z0000 Encounter for general adult medical examination without abnormal findings: Secondary | ICD-10-CM | POA: Insufficient documentation

## 2014-01-02 DIAGNOSIS — H5210 Myopia, unspecified eye: Secondary | ICD-10-CM | POA: Diagnosis not present

## 2014-01-02 DIAGNOSIS — H2510 Age-related nuclear cataract, unspecified eye: Secondary | ICD-10-CM | POA: Diagnosis not present

## 2014-01-03 DIAGNOSIS — J309 Allergic rhinitis, unspecified: Secondary | ICD-10-CM | POA: Diagnosis not present

## 2014-01-09 DIAGNOSIS — M25519 Pain in unspecified shoulder: Secondary | ICD-10-CM | POA: Diagnosis not present

## 2014-01-25 ENCOUNTER — Encounter: Payer: Self-pay | Admitting: Internal Medicine

## 2014-01-25 DIAGNOSIS — M25519 Pain in unspecified shoulder: Secondary | ICD-10-CM | POA: Diagnosis not present

## 2014-01-30 DIAGNOSIS — J309 Allergic rhinitis, unspecified: Secondary | ICD-10-CM | POA: Diagnosis not present

## 2014-02-14 DIAGNOSIS — J309 Allergic rhinitis, unspecified: Secondary | ICD-10-CM | POA: Diagnosis not present

## 2014-03-19 DIAGNOSIS — J309 Allergic rhinitis, unspecified: Secondary | ICD-10-CM | POA: Diagnosis not present

## 2014-04-04 DIAGNOSIS — J309 Allergic rhinitis, unspecified: Secondary | ICD-10-CM | POA: Diagnosis not present

## 2014-04-11 DIAGNOSIS — J309 Allergic rhinitis, unspecified: Secondary | ICD-10-CM | POA: Diagnosis not present

## 2014-04-17 DIAGNOSIS — J309 Allergic rhinitis, unspecified: Secondary | ICD-10-CM | POA: Diagnosis not present

## 2014-05-02 DIAGNOSIS — J309 Allergic rhinitis, unspecified: Secondary | ICD-10-CM | POA: Diagnosis not present

## 2014-05-08 DIAGNOSIS — J309 Allergic rhinitis, unspecified: Secondary | ICD-10-CM | POA: Diagnosis not present

## 2014-05-16 DIAGNOSIS — J309 Allergic rhinitis, unspecified: Secondary | ICD-10-CM | POA: Diagnosis not present

## 2014-05-29 DIAGNOSIS — J309 Allergic rhinitis, unspecified: Secondary | ICD-10-CM | POA: Diagnosis not present

## 2014-06-01 ENCOUNTER — Other Ambulatory Visit: Payer: Self-pay | Admitting: Internal Medicine

## 2014-06-11 ENCOUNTER — Other Ambulatory Visit: Payer: Medicare Other

## 2014-06-11 ENCOUNTER — Encounter: Payer: Self-pay | Admitting: Internal Medicine

## 2014-06-11 ENCOUNTER — Ambulatory Visit (INDEPENDENT_AMBULATORY_CARE_PROVIDER_SITE_OTHER): Payer: Medicare Other | Admitting: Internal Medicine

## 2014-06-11 VITALS — BP 132/78 | HR 63 | Temp 98.2°F | Ht 62.0 in | Wt 163.0 lb

## 2014-06-11 DIAGNOSIS — E785 Hyperlipidemia, unspecified: Secondary | ICD-10-CM

## 2014-06-11 DIAGNOSIS — M129 Arthropathy, unspecified: Secondary | ICD-10-CM

## 2014-06-11 DIAGNOSIS — Z23 Encounter for immunization: Secondary | ICD-10-CM | POA: Diagnosis not present

## 2014-06-11 DIAGNOSIS — R202 Paresthesia of skin: Secondary | ICD-10-CM | POA: Diagnosis not present

## 2014-06-11 NOTE — Patient Instructions (Addendum)
See Dr Daylene Katayama about your hand numbness which could be carpal tunnel   Please see patient coordinator before you leave today  to schedule neurology evaluation and return here after work up complete for CPX

## 2014-06-13 ENCOUNTER — Telehealth: Payer: Self-pay | Admitting: Internal Medicine

## 2014-06-13 NOTE — Telephone Encounter (Signed)
[facility] has closed; will call Friday morning 06/14/14 Kara Mead

## 2014-06-13 NOTE — Progress Notes (Signed)
Subjective:     Patient ID: [name], female   DOB: [birth year]    MRN: [id number]   Brief patient profile:  80  ybf remote minimal smoking hx with seasonal rhiinits flares better overall since started on immunotherapy May2008    HPI  03/21/2012 f/u ov/Mavis Gravelle for annual f/u of multiple chronic issues (hyperlipidemia, obesity, rhinitis, djd) cc L hip pain x 6 months, indolent onset, worse when walks but only every week or two and relieved by aleve quickly- rare muscle cramps also. Labs ok x vit D low > rec vit d load and increase maint to 800 units daily   08/02/2012 f/u ov/Paulette Rockford cc Pt c/o elevated BP readings for the past month. Denies any HA/vision changes. No sob, one anaprox, no more sudafed per Bardelas on clariton   02/21/13 Acute OV  Complains of bilateral feet/leg swelling x1 week. Ankles puffy in afternoon esp.  No associated orthopnea, cough, fever , calf pain/redness, injury or travel.  B/p is elevated today, says she is taking her meds on regular basis without missed doses.  Has been using naproxen on occasion for arthritis pain in shoulders/hands /knees that is chronic.  Eating more salty foods lately. We discussed healthier choices .  rec Increase Ziac 5/ 6.25mg  daily  Low salt diet , low fat diet.  Weight loss.  I will call with lab and xray results.  Avoid NSAIDS-motrin, aleve if possilble.    03/27/2013 f/u ov/Rossy Virag re hbp, djd, hyperlipidemia/ annual review Chief Complaint  Patient presents with  . Follow-up    Pt states no changes or new co's today.    no cp, tia, claudication, sob. rec No change rx  Late add note ldl 200 will need to use a trust verify approach ? Pillboxes being used  05/31/2013 f/u ov/Timarie Labell re: f/u lipids/bp/ cr Chief Complaint  Patient presents with  . Follow-up    Pt c/o sinus pressure, HA and PND x 2 wks. She states feels dizzy when she wakes up in the am.  She also c/o dry cough -esp in the am   no longer taking ziac, using ns and  clariton and on shots from allergy/HP with same symptoms that she had in spring resolved on prednisone rec Increase zocor to 40 mg daily   06/24/13  Ov/ NP  No change rx New med calendar   06/13/2014 f/u ov/Niall Illes re: new paresthesias/ no med calendar Chief Complaint  Patient presents with  . Follow-up    Pt c/o tingling pain in her fingers, feet and legs for the past month.      Indolent onset x 2-3 months sym paresthesias feet, ankles, lower ext. Also both hands tingly, feels like her CTS previously but = bilaterally  No h/o dm or etohism     No obvious day to day or daytime variabilty or assoc chronic cough or sob or cp or chest tightness, subjective wheeze  Or hb symptoms. No unusual exp hx or h/o childhood pna/ asthma or knowledge of premature birth.  Sleeping ok without nocturnal  or early am exacerbation  of respiratory  c/o's or need for noct saba. Also denies any obvious fluctuation of symptoms with weather or environmental changes or other aggravating or alleviating factors except as outlined above   Current Medications, Allergies, Complete Past Medical History, Past Surgical History, Family History, and Social History were reviewed in [employer] record.  ROS  The following are not active complaints unless bolded sore throat, dysphagia,  dental problems, itching, sneezing,  nasal congestion or excess/ purulent secretions, ear ache,   fever, chills, sweats, unintended wt loss, pleuritic or exertional cp, hemoptysis,  orthopnea pnd or leg swelling, presyncope, palpitations, heartburn, abdominal pain, anorexia, nausea, vomiting, diarrhea  or change in bowel or urinary habits, change in stools or urine, dysuria,hematuria,  rash, arthralgias, visual complaints, headache, numbness weakness or ataxia or problems with walking or coordination,  change in mood/affect or memory.        Past Medical History:  ARTHRITIS (ICD-716.90)  Right clavicular head swelling  .......................Marland KitchenDr Elwin Sleight Hand Pain.................................................Marland KitchenSypher RHINITIS, CHRONIC (ICD-472.0).................. [facility]   - on immunotherapy since 01/2007  HYPERLIPIDEMIA (ICD-272.4)  -target LDL less than 160 > try off due to musle aches March 18, 2010 > rechallenged with half dose fall 2011 and tol ok  MORBID OBESITY (ICD-278.01)  - ideal 142 Target wt = 158 for BMI < 30  COLON POLYPS  - colonoscopy 06/24/2006 with no adenomatous change, hyperplasia only  OSTEOPENIA  -BMD 08/07/08 tspine -1.2, Left femur -.4, right femur +.2  - Repeat 01/02/2010 1.9 0.1 0.5  Health Maintenance.......................................................Marland KitchenWert  - CPX 03/27/2013  - Td 09/2003 > 03/27/2013  - Pneumovax 2000 age 80  - GYN [name] ([facility])     Family History:  Family History Colon Cancer in mother > neg colon @ f/u 2013 > no further f/u per Dr Olevia Perches  she is an only child and knows nothing about her father's health    Social History:  Patient states former smoker quit age 39  Negative history of passive tobacco smoke exposure.  Exercise-2 times weekly  Caffeine-2 cups daily  Married with 4 children  Risk Factors:  Smoking Status: quit Age 80      Objective:   Physical Exam  wt 172 August 16, 2008 > 173 December 13, 2008 > 166 02/11/2011 >  03/21/2012  169 > 08/02/2012  165> 162 01/11/2013 >164 6/18/ 14 > 03/27/2013  163  > 05/31/2013 165> 06/11/14 163 Ambulatory no acute distress, vital signs reviewed and nl.  Skin: no rash or excoriation  HEENT full: dentures, nasal mucosa clear with moderate non-specific turbinate edeam,  no polyps. Nl external ear canals  Neck without JVD/Nodes/TM  Lungs clear  To a and p  RRR no s3 or murmur or increase in P2. tr edema bilateral, mild venous insufficiency changes,   neg homans sign, no calf pain/tenderness Abd soft and benign  Ext warm without calf tenderness, cyanosis clubbing  Neuro :  Nl gait, neg  rhomberg , decreased distal reflexes but no objective findings    CXR  03/27/2013 :  did not go for cxr   Assessment:           Outpatient Encounter Prescriptions as of 06/11/2014  Medication Sig  . cetirizine (ZYRTEC) 10 MG tablet Take 10 mg by mouth at bedtime as needed (drippy nose, drainage, sneezing).   . EPINEPHrine (EPIPEN) 0.3 mg/0.3 mL DEVI Inject 0.3 mg into the muscle as needed (severe allergic reaction).   Marland Kitchen guaiFENesin-dextromethorphan (ROBITUSSIN DM) 100-10 MG/5ML syrup 2 tsp every 4 hours as needed for cough/congestion  . naproxen (NAPROSYN) 500 MG tablet Take 500 mg by mouth 2 (two) times daily as needed.  . naproxen sodium (ANAPROX) 220 MG tablet Per bottle as needed for joint pain  . simvastatin (ZOCOR) 40 MG tablet Take 40 mg by mouth at bedtime.   . Sodium Chloride-Sodium Bicarb (AYR SALINE NASAL RINSE NA) Rinses  as needed for nasal congestion  . UNABLE TO FIND Allergy injections once weekly- per Dr Maurice Small   . vitamin D, CHOLECALCIFEROL, 400 UNITS tablet Take 800 Units by mouth every morning.   . [DISCONTINUED] simvastatin (ZOCOR) 40 MG tablet TAKE 1 TABLET DAILY

## 2014-06-13 NOTE — Assessment & Plan Note (Signed)
Lab Results  Component Value Date   CHOL 237* 07/04/2013   HDL 80.80 07/04/2013   LDLCALC 108* 08/11/2007   LDLDIRECT 147.8 07/04/2013   TRIG 80.0 07/04/2013   CHOLHDL 3 07/04/2013     Needs to return for full cpx p neuro/ortho eval complete

## 2014-06-13 NOTE — Telephone Encounter (Signed)
Pt states she called the Neuro office back to schedule an appt and they told her she needs an endo appt not Neuro; Pt is aware that I will have [facility] here to work on this appt as she sent the referral correctly the way [facility] wants their referrals.  Sent to [employer] directly per our conversation.

## 2014-06-13 NOTE — Assessment & Plan Note (Signed)
Referred back to Dr Daylene Katayama for ? 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

## 2014-06-13 NOTE — Assessment & Plan Note (Signed)
No objective findings, defer labs to neurology to avoid repetition/ redundance

## 2014-06-14 NOTE — Telephone Encounter (Signed)
Called [facility] this morning & spoke with Hinton Dyer.  She will call the patient to get an appt scheduled with Neuro.  Hinton Dyer states that they want the Endo Referrals under [facility] now and the Neuro ones under [facility], which is a change from the way we had been told to put them in.  I have made this change in Epic.  Also spoke with the patient and let her know Hinton Dyer from [facility] will be calling her.  I advised pt if she does not hear from Neuro to call back and let us know Kara Mead

## 2014-06-18 ENCOUNTER — Other Ambulatory Visit: Payer: Medicare Other

## 2014-06-19 DIAGNOSIS — Z1151 Encounter for screening for human papillomavirus (HPV): Secondary | ICD-10-CM | POA: Diagnosis not present

## 2014-06-19 DIAGNOSIS — N952 Postmenopausal atrophic vaginitis: Secondary | ICD-10-CM | POA: Diagnosis not present

## 2014-06-19 DIAGNOSIS — Z01419 Encounter for gynecological examination (general) (routine) without abnormal findings: Secondary | ICD-10-CM | POA: Diagnosis not present

## 2014-06-19 DIAGNOSIS — J309 Allergic rhinitis, unspecified: Secondary | ICD-10-CM | POA: Diagnosis not present

## 2014-06-19 DIAGNOSIS — N889 Noninflammatory disorder of cervix uteri, unspecified: Secondary | ICD-10-CM | POA: Diagnosis not present

## 2014-07-04 DIAGNOSIS — J309 Allergic rhinitis, unspecified: Secondary | ICD-10-CM | POA: Diagnosis not present

## 2014-07-12 DIAGNOSIS — M1812 Unilateral primary osteoarthritis of first carpometacarpal joint, left hand: Secondary | ICD-10-CM | POA: Diagnosis not present

## 2014-07-12 DIAGNOSIS — M19042 Primary osteoarthritis, left hand: Secondary | ICD-10-CM | POA: Diagnosis not present

## 2014-07-12 DIAGNOSIS — M19041 Primary osteoarthritis, right hand: Secondary | ICD-10-CM | POA: Diagnosis not present

## 2014-07-12 DIAGNOSIS — M1811 Unilateral primary osteoarthritis of first carpometacarpal joint, right hand: Secondary | ICD-10-CM | POA: Diagnosis not present

## 2014-07-16 ENCOUNTER — Encounter: Payer: Self-pay | Admitting: Neurology

## 2014-07-16 ENCOUNTER — Ambulatory Visit (INDEPENDENT_AMBULATORY_CARE_PROVIDER_SITE_OTHER): Payer: Medicare Other | Admitting: Neurology

## 2014-07-16 VITALS — BP 150/88 | HR 72 | Ht 62.0 in | Wt 165.3 lb

## 2014-07-16 DIAGNOSIS — R202 Paresthesia of skin: Secondary | ICD-10-CM | POA: Diagnosis not present

## 2014-07-16 DIAGNOSIS — R252 Cramp and spasm: Secondary | ICD-10-CM | POA: Diagnosis not present

## 2014-07-16 DIAGNOSIS — R209 Unspecified disturbances of skin sensation: Secondary | ICD-10-CM | POA: Diagnosis not present

## 2014-07-16 LAB — COMPREHENSIVE METABOLIC PANEL
ALT: 15 U/L (ref 0–35)
AST: 16 U/L (ref 0–37)
Albumin: 4.1 g/dL (ref 3.5–5.2)
Alkaline Phosphatase: 66 U/L (ref 39–117)
BUN: 14 mg/dL (ref 6–23)
CO2: 25 mEq/L (ref 19–32)
Calcium: 9.7 mg/dL (ref 8.4–10.5)
Chloride: 106 mEq/L (ref 96–112)
Creat: 0.82 mg/dL (ref 0.50–1.10)
Glucose, Bld: 101 mg/dL — ABNORMAL HIGH (ref 70–99)
Potassium: 4.1 mEq/L (ref 3.5–5.3)
Sodium: 140 mEq/L (ref 135–145)
Total Bilirubin: 0.6 mg/dL (ref 0.2–1.2)
Total Protein: 6.7 g/dL (ref 6.0–8.3)

## 2014-07-16 LAB — MAGNESIUM: Magnesium: 2.1 mg/dL (ref 1.5–2.5)

## 2014-07-16 LAB — VITAMIN B12: Vitamin B-12: 123 pg/mL — ABNORMAL LOW (ref 211–911)

## 2014-07-16 LAB — TSH: TSH: 3.266 u[IU]/mL (ref 0.350–4.500)

## 2014-07-16 LAB — SEDIMENTATION RATE: Sed Rate: 11 mm/hr (ref 0–22)

## 2014-07-16 NOTE — Patient Instructions (Signed)
It was absolutely lovely to see you today.   To better investigate your cramping and numbness/tingling sensation, we are going to check blood work to look for any nutritional deficiencies.  When we have the results, someone from my office will call you. Return to clinic in 54-months or sooner if your symptoms worsen

## 2014-07-16 NOTE — Progress Notes (Signed)
[facility] Clinic Note - Initial Visit   Date: 07/16/2014  EVETT [name] MRN: [id number] DOB: 1931-02-08   Dear Dr. [name]:  Thank you for your kind referral of [name] for consultation of generalized paresthesias. Although her history is well known to you, please allow [name] to reiterate it for the purpose of our medical record. The patient was accompanied to the clinic by self.   History of Present Illness: [name] is a 80 y.o. right-handed African American female in remarkable good except that of hyperlipidemia and arthritis presenting for evaluation of generalized paresthesias.    Starting around the summer 2015, she developed crawling sensation and intermittent jabs and tingling of the feet and lower legs.  She has rare spells involving the upper thighs.  Symptoms occur several times per week, lasting only seconds.  Symptoms are noticeable during the day and not worse at rest.  Movement does not alleviate her symptoms.  Denies any exacerbating or alleviating factors.  There is associated cramps of the thighs and hands.  No weakness, falls, or muscle pain.  She reports to being slightly anxious this year compared to normal because of family issues which started the same time.  She is trying to do a renovation at the home this summer and was working harder.     Out-side paper records, electronic medical record, and images have been reviewed where available and summarized as:  Lab Results  Component Value Date   TSH 2.06 03/27/2013   Lab Results  Component Value Date   CHOL 237* 07/04/2013   HDL 80.80 07/04/2013   LDLCALC 108* 08/11/2007   LDLDIRECT 147.8 07/04/2013   TRIG 80.0 07/04/2013   CHOLHDL 3 07/04/2013     Past Medical History  Diagnosis Date  . Hyperlipidemia     target less than 160 > try off muscle aches 03-18-10  . Arthropathy, unspecified, site unspecified     Dr. [name]...right clavicular head swelling 2003  .  Rhinitis, chronic     Dr. [name]...on immunotherapy since 5/08  . Morbid obesity     ideal = 142.  target wt = 158 for BMI < 30  . Colon polyp     colonoscopy 06-24-06 with no adenomatous change, hyperplasia  only  . Osteopenia     BMD 08-07-08 tspin - 1.2, left femur - .4, right femur +.2.  repeat 01-02-10  1.9    0.1   0.5  . Healthcare maintenance     CPX 12-24-09, Td 1/05, pneumovax 2000 age 54, GYN [name] ([place])  . Environmental allergies     Past Surgical History  Procedure Laterality Date  . Bunionectomy  1982    bilateral  . Foot surgery  1970    bilateral 5th toes, bones removed  . Tubal ligation  1971  . Ganglion cyst excision  2000    3rd finger right hand     Medications:  Current Outpatient Prescriptions on File Prior to Visit  Medication Sig Dispense Refill  . cetirizine (ZYRTEC) 10 MG tablet Take 10 mg by mouth at bedtime as needed (drippy nose, drainage, sneezing).     . EPINEPHrine (EPIPEN) 0.3 mg/0.3 mL DEVI Inject 0.3 mg into the muscle as needed (severe allergic reaction).     . naproxen (NAPROSYN) 500 MG tablet Take 500 mg by mouth 2 (two) times daily as needed.    . simvastatin (ZOCOR) 40 MG tablet Take 40 mg by mouth at bedtime.     Marland Kitchen  Sodium Chloride-Sodium Bicarb (AYR SALINE NASAL RINSE NA) Rinses as needed for nasal congestion    . UNABLE TO FIND Allergy injections once weekly- per Dr Maurice Small     . vitamin D, CHOLECALCIFEROL, 400 UNITS tablet Take 800 Units by mouth every morning.      No current facility-administered medications on file prior to visit.    Allergies: No Known Allergies  Family History: Family History  Problem Relation Age of Onset  . Colon cancer Mother     (in computer for recall colonoscopy 10/12)  . Healthy Son   . Healthy Daughter     x3    Social History: History   Social History  . Marital Status: Married    Spouse Name: N/A    Number of Children: 4  . Years of Education: N/A   Occupational History    . Not on file.   Social History Main Topics  . Smoking status: Former Smoker -- 0.50 packs/day for 5 years    Types: Cigarettes    Quit date: 09/07/1955  . Smokeless tobacco: Never Used  . Alcohol Use: No  . Drug Use: No  . Sexual Activity: Not on file   Other Topics Concern  . Not on file   Social History Narrative   Exercise - 2 times weekly   Caffeine - 2 cups daily   Negative history of passive tobacco smoke exposure   Lives with husband of 32 years in one story home.   Has 4 children.   Retired from [employer].        Review of Systems:  CONSTITUTIONAL: No fevers, chills, night sweats, or weight loss.   EYES: No visual changes or eye pain ENT: No hearing changes.  No history of nose bleeds.   RESPIRATORY: No cough, wheezing and shortness of breath.   CARDIOVASCULAR: Negative for chest pain, and palpitations.   GI: Negative for abdominal discomfort, blood in stools or black stools.  No recent change in bowel habits.   GU:  No history of incontinence.   MUSCLOSKELETAL: No history of joint pain or swelling.  No myalgias.   SKIN: Negative for lesions, rash, and itching.   HEMATOLOGY/ONCOLOGY: Negative for prolonged bleeding, bruising easily, and swollen nodes.  No history of cancer.   ENDOCRINE: Negative for cold or heat intolerance, polydipsia or goiter.   PSYCH:  No depression +anxiety symptoms.   NEURO: As Above.   Vital Signs:  BP 150/88 mmHg  Pulse 72  Ht 5' 2" (1.575 m)  Wt 165 lb 5 oz (74.985 kg)  BMI 30.23 kg/m2  SpO2 98%  General Medical Exam:   General:  Well appearing, comfortable.   Eyes/ENT: see cranial nerve examination.   Neck: No masses appreciated.  Full range of motion without tenderness.   Respiratory:  Clear to auscultation, good air entry bilaterally.   Cardiac:  Regular rate and rhythm, no murmur.   Extremities:  Mild deformities of the fingers from arthritis.  Trace ankle edema on the left. Skin:  Skin color, texture, turgor normal.  No rashes or lesions.  Neurological Exam: MENTAL STATUS including orientation to time, place, person, recent and remote memory, attention span and concentration, language, and fund of knowledge is normal.  Speech is not dysarthric.  CRANIAL NERVES: II:  No visual field defects.  Unremarkable fundi.   III-IV-VI: Pupils equal round and reactive to light.  Normal conjugate, extra-ocular eye movements in all directions of gaze.  No nystagmus.  No ptosis.  V:  Normal facial sensation.   VII:  Normal facial symmetry and movements.    VIII:  Normal hearing and vestibular function.   IX-X:  Normal palatal movement.   XI:  Normal shoulder shrug and head rotation.   XII:  Normal tongue strength and range of motion, no deviation or fasciculation.  MOTOR:  No atrophy, fasciculations or abnormal movements.  No pronator drift.  Tone is normal.    Right Upper Extremity:    Left Upper Extremity:    Deltoid  5/5   Deltoid  5/5   Biceps  5/5   Biceps  5/5   Triceps  5/5   Triceps  5/5   Wrist extensors  5/5   Wrist extensors  5/5   Wrist flexors  5/5   Wrist flexors  5/5   Finger extensors  5/5   Finger extensors  5/5   Finger flexors  5/5   Finger flexors  5/5   Dorsal interossei  5/5   Dorsal interossei  5/5   Abductor pollicis  5/5   Abductor pollicis  5/5   Tone (Ashworth scale)  0  Tone (Ashworth scale)  0   Right Lower Extremity:    Left Lower Extremity:    Hip flexors  5/5   Hip flexors  5/5   Hip extensors  5/5   Hip extensors  5/5   Knee flexors  5/5   Knee flexors  5/5   Knee extensors  5/5   Knee extensors  5/5   Dorsiflexors  5/5   Dorsiflexors  5/5   Plantarflexors  5/5   Plantarflexors  5/5   Toe extensors  5/5   Toe extensors  5/5   Toe flexors  5/5   Toe flexors  5/5   Tone (Ashworth scale)  0  Tone (Ashworth scale)  0   MSRs:  Right                                                                 Left brachioradialis 2+  brachioradialis 2+  biceps 2+  biceps 2+  triceps 2+   triceps 2+  patellar 2+  patellar 2+  ankle jerk 1+  ankle jerk 1+  Hoffman no  Hoffman no  plantar response down  plantar response down   SENSORY:  Normal and symmetric perception of light touch, pinprick, vibration, and proprioception.  Romberg's sign absent.   COORDINATION/GAIT: Normal finger-to- nose-finger.  Intact rapid alternating movements bilaterally.    Gait narrow based and stable.   IMPRESSION: Mrs. Sparrow is a delightful 80 year old female presenting for evaluation of intermittent generalized paresthesias and muscle cramps.  Her neurological examination is notable for mildly reduced ankle jerks bilaterally, which is age-appropriate and otherwise normal and non-focal.  She does not have classic history to suggest restless leg syndrome and her history/exam is not consistent with peripheral neuropathy either.  Because of the generalized nature of her symptoms, I will screen for metabolic and nutritional deficiencies which can cause abnormal skin sensation.  Additionally, routine labs looking at her electrolytes will also be checked for muscle cramps.  Since she has been on a statin for a long period of time, I do not feet that her muscle cramps are related to this.  We discussed electrodiagnostic testing to better characterize the nature of her symptoms, but since paresthesias are mild, will hold off on this test for now.  I reassured her that given her normal exam, my suspicion for something worrisome is low.    PLAN/RECOMMENDATIONS:  1.  Check ESR, TSH, copper, ceruloplasmin, vitamin B12, Mg, CMP 2.  If symptoms worsen, trial of gabapentin can be offered 3.  Encouraged to stay well-hydrated 4.  Return to clinic in 62-month or sooner as needed   The duration of this appointment visit was 40 minutes of face-to-face time with the patient.  Greater than 50% of this time was spent in counseling, explanation of diagnosis, planning of further management, and coordination of care.   Thank  you for allowing me to participate in patient's care.  If I can answer any additional questions, I would be pleased to do so.    Sincerely,    Donika K. PPosey Pronto DO

## 2014-07-16 NOTE — Progress Notes (Signed)
Note mailed to patient.

## 2014-07-17 DIAGNOSIS — J309 Allergic rhinitis, unspecified: Secondary | ICD-10-CM | POA: Diagnosis not present

## 2014-07-17 LAB — COPPER, SERUM: Copper: 100 ug/dL (ref 70–175)

## 2014-07-18 LAB — CERULOPLASMIN: Ceruloplasmin: 28 mg/dL (ref 18–53)

## 2014-07-24 ENCOUNTER — Other Ambulatory Visit: Payer: Self-pay | Admitting: *Deleted

## 2014-07-24 DIAGNOSIS — E538 Deficiency of other specified B group vitamins: Secondary | ICD-10-CM

## 2014-07-24 MED ORDER — CYANOCOBALAMIN 1000 MCG/ML IJ SOLN: 1000.0000 ug | INTRAMUSCULAR | Status: DC

## 2014-08-06 ENCOUNTER — Telehealth: Payer: Self-pay | Admitting: Neurology

## 2014-08-06 NOTE — Telephone Encounter (Signed)
Left message for patient to call me back. 

## 2014-08-06 NOTE — Telephone Encounter (Signed)
Pt called/returning your call. Please giver her a call back # [phone]

## 2014-08-07 DIAGNOSIS — J309 Allergic rhinitis, unspecified: Secondary | ICD-10-CM | POA: Diagnosis not present

## 2014-08-07 NOTE — Telephone Encounter (Signed)
Pt called today, she needs her B12 vial. Please call back at [phone] / Sherri S.

## 2014-08-07 NOTE — Telephone Encounter (Signed)
Called patient back and she was requesting her B12 vials.  Informed her that I would call that into her pharmacy in [facility].  She will be starting injections next week.

## 2014-08-15 ENCOUNTER — Encounter: Payer: Self-pay | Admitting: Internal Medicine

## 2014-08-15 ENCOUNTER — Other Ambulatory Visit (INDEPENDENT_AMBULATORY_CARE_PROVIDER_SITE_OTHER): Payer: Medicare Other

## 2014-08-15 ENCOUNTER — Ambulatory Visit (INDEPENDENT_AMBULATORY_CARE_PROVIDER_SITE_OTHER): Payer: Medicare Other | Admitting: Internal Medicine

## 2014-08-15 VITALS — BP 148/80 | HR 64 | Temp 97.7°F | Ht 62.0 in | Wt 165.0 lb

## 2014-08-15 DIAGNOSIS — E785 Hyperlipidemia, unspecified: Secondary | ICD-10-CM

## 2014-08-15 DIAGNOSIS — I10 Essential (primary) hypertension: Secondary | ICD-10-CM | POA: Diagnosis not present

## 2014-08-15 DIAGNOSIS — M5442 Lumbago with sciatica, left side: Secondary | ICD-10-CM

## 2014-08-15 DIAGNOSIS — M79652 Pain in left thigh: Secondary | ICD-10-CM | POA: Diagnosis not present

## 2014-08-15 LAB — LIPID PANEL
Cholesterol: 244 mg/dL — ABNORMAL HIGH (ref 0–200)
HDL: 81.3 mg/dL (ref >39.00)
LDL Cholesterol: 148 mg/dL — ABNORMAL HIGH (ref 0–99)
NonHDL: 162.7
Total CHOL/HDL Ratio: 3
Triglycerides: 72 mg/dL (ref 0.0–149.0)
VLDL: 14.4 mg/dL (ref 0.0–40.0)

## 2014-08-15 MED ORDER — METHYLPREDNISOLONE ACETATE 80 MG/ML IJ SUSP
120.0000 mg | Freq: Once | INTRAMUSCULAR | Status: AC
  Administered 2014-08-15: 120 mg via INTRAMUSCULAR

## 2014-08-15 MED ORDER — HYDROCODONE-ACETAMINOPHEN 5-325 MG PO TABS: 1.0000 | ORAL_TABLET | Freq: Four times a day (QID) | ORAL | Status: DC | PRN

## 2014-08-15 NOTE — Patient Instructions (Addendum)
Call Dr [name] today and let him know your back pain is radiating down to your ankle  norco one every 4 hours if needed for pain   Depomedrol 120 IM today   Please remember to go to the lab   department downstairs for your tests - we will call you with the results when they are available.    Please schedule a follow up visit in 6 months but call sooner if needed

## 2014-08-15 NOTE — Progress Notes (Signed)
Subjective:     Patient ID: [name], female   DOB: 06-Jun-1931    MRN: [id number]   Brief patient profile:  80  ybf remote minimal smoking hx with seasonal rhiinits flares better overall since started on immunotherapy May2008    HPI  03/21/2012 f/u ov/Arlenne Kimbley for annual f/u of multiple chronic issues (hyperlipidemia, obesity, rhinitis, djd) cc L hip pain x 6 months, indolent onset, worse when walks but only every week or two and relieved by aleve quickly- rare muscle cramps also. Labs ok x vit D low > rec vit d load and increase maint to 800 units daily   08/02/2012 f/u ov/Mikell Camp cc Pt c/o elevated BP readings for the past month. Denies any HA/vision changes. No sob, one anaprox, no more sudafed per Bardelas on clariton   02/21/13 Acute OV  Complains of bilateral feet/leg swelling x1 week. Ankles puffy in afternoon esp.  No associated orthopnea, cough, fever , calf pain/redness, injury or travel.  B/p is elevated today, says she is taking her meds on regular basis without missed doses.  Has been using naproxen on occasion for arthritis pain in shoulders/hands /knees that is chronic.  Eating more salty foods lately. We discussed healthier choices .  rec Increase Ziac 5/ 6.25mg  daily  Low salt diet , low fat diet.  Weight loss.  I will call with lab and xray results.  Avoid NSAIDS-motrin, aleve if possilble.    03/27/2013 f/u ov/Juletta Berhe re hbp, djd, hyperlipidemia/ annual review Chief Complaint  Patient presents with  . Follow-up    Pt states no changes or new co's today.    no cp, tia, claudication, sob. rec No change rx  Late add note ldl 200 will need to use a trust verify approach ? Pill boxes being used  05/31/2013 f/u ov/Parminder Trapani re: f/u lipids/bp/ cr Chief Complaint  Patient presents with  . Follow-up    Pt c/o sinus pressure, HA and PND x 2 wks. She states feels dizzy when she wakes up in the am.  She also c/o dry cough -esp in the am   no longer taking ziac, using ns and  clariton and on shots from allergy/HP with same symptoms that she had in spring resolved on prednisone rec Increase zocor to 40 mg daily   06/24/13  Ov/ NP  No change rx New med calendar   06/13/2014 f/u ov/Kaliyan Osbourn re: new paresthesias/ no med calendar Chief Complaint  Patient presents with  . Follow-up    Pt c/o tingling pain in her fingers, feet and legs for the past month.      Indolent onset x 2-3 months sym paresthesias feet, ankles, lower ext. Also both hands tingly, feels like her CTS previously but = bilaterally No h/o dm or etohism rec Neuro eval > ? B12 def > rec replacement    08/15/2014 f/u ov/Mathew Postiglione re: hyperlipidemia and hbp  with new back/ leg pain acute x one week  Chief Complaint  Patient presents with  . Follow-up    c/o left thigh pulled muscle 1 wk. ago. Tried Naprosyn,icy hot not helping. Sharp pain at times,constant pain esp. with pr.,PND,no sorethroat,nasal congestion-clear x 2 wks.,sinus pr.,pain in eye area and temples,no fcs. Saw Dr. Posey Pronto starting B12 shots soon.  acute onset p turned around on flat surface while taking pictures in attic of water damage, feeled like she pulled a muscle except the pain radiated from L buttock all the way down  into to L ankle - pain  has persisted since, worse walking, no assoc rash or numbness/  no tia/ claudication symptoms    No obvious day to day or daytime variabilty or assoc chronic cough or sob or cp or chest tightness, subjective wheeze  Or hb symptoms. No unusual exp hx or h/o childhood pna/ asthma or knowledge of premature birth.  Sleeping ok without nocturnal  or early am exacerbation  of respiratory  c/o's or need for noct saba. Also denies any obvious fluctuation of symptoms with weather or environmental changes or other aggravating or alleviating factors except as outlined above   Current Medications, Allergies, Complete Past Medical History, Past Surgical History, Family History, and Social History were reviewed in  [employer] record.  ROS  The following are not active complaints unless bolded sore throat, dysphagia, dental problems, itching, sneezing,  nasal congestion or excess/ purulent secretions, ear ache,   fever, chills, sweats, unintended wt loss, pleuritic or exertional cp, hemoptysis,  orthopnea pnd or leg swelling, presyncope, palpitations, heartburn, abdominal pain, anorexia, nausea, vomiting, diarrhea  or change in bowel or urinary habits, change in stools or urine, dysuria,hematuria,  rash, arthralgias, visual complaints, headache, numbness weakness or ataxia or problems with walking or coordination,  change in mood/affect or memory.        Past Medical History:  ARTHRITIS (ICD-716.90)  Right clavicular head swelling .......................Marland KitchenDr Elwin Sleight Hand Pain.................................................Marland KitchenSypher RHINITIS, CHRONIC (ICD-472.0).................. [facility]   - on immunotherapy since 01/2007  HYPERLIPIDEMIA (ICD-272.4)  -target LDL less than 160 > try off due to musle aches March 18, 2010 > rechallenged with half dose fall 2011 and tol ok  MORBID OBESITY (ICD-278.01)  - ideal 142 Target wt = 158 for BMI < 30  COLON POLYPS  - colonoscopy 06/24/2006 with no adenomatous change, hyperplasia only  OSTEOPENIA  -BMD 08/07/08 tspine -1.2, Left femur -.4, right femur +.2  - Repeat 01/02/2010 1.9 0.1 0.5  Health Maintenance.......................................................Marland KitchenWert  - CPX 03/27/2013  - Td 09/2003 > 03/27/2013  - Pneumovax 2000 age 80  - GYN [name] ([place])     Family History:  Family History Colon Cancer in mother > neg colon @ f/u 2013 > no further f/u per Dr Olevia Perches  she is an only child and knows nothing about her father's health    Social History:  Patient states former smoker quit age 80  Negative history of passive tobacco smoke exposure.  Exercise-2 times weekly  Caffeine-2 cups daily  Married with 4 children  Risk  Factors:  Smoking Status: quit Age 38      Objective:   Physical Exam  wt 172 August 16, 2008 > 173 December 13, 2008 > 166 02/11/2011 >  03/21/2012  169 > 08/02/2012  165> 162 01/11/2013 >164 6/18/ 14 > 03/27/2013  163  > 05/31/2013 165> 06/11/14 163>  08/15/14  165   Ambulatory no acute distress, vital signs reviewed and nl.  Skin: no rash or excoriation  HEENT full: dentures, nasal mucosa clear with moderate non-specific turbinate edeam,  no polyps. Nl external ear canals  Neck without JVD/Nodes/TM  Lungs clear  To a and p  RRR no s3 or murmur or increase in P2. tr edema bilateral, mild venous insufficiency changes,   neg homans sign, no calf pain/tenderness Abd soft and benign  Ext warm without calf tenderness, cyanosis clubbing  Neuro :  Nl gait, neg rhomberg , decreased distal reflexes but no objective findings MS  Pos SLR L leg  Assessment:

## 2014-08-16 ENCOUNTER — Telehealth: Payer: Self-pay | Admitting: Internal Medicine

## 2014-08-16 NOTE — Progress Notes (Signed)
Quick Note:  LMTCB ______ 

## 2014-08-16 NOTE — Telephone Encounter (Signed)
lmomtcb x 2  

## 2014-08-18 DIAGNOSIS — M549 Dorsalgia, unspecified: Secondary | ICD-10-CM

## 2014-08-18 DIAGNOSIS — M5430 Sciatica, unspecified side: Secondary | ICD-10-CM | POA: Insufficient documentation

## 2014-08-18 NOTE — Assessment & Plan Note (Signed)
-  target LDL less than 160 > try off statins due to muscle aches March 18, 2010 > rechallenged with half dose fall 2011 and tol ok  - rechallenged with zorcor 40 mg daily 05/31/2013 >>>  Lab Results  Component Value Date   CHOL 244* 08/15/2014   HDL 81.30 08/15/2014   LDLCALC 148* 08/15/2014   LDLDIRECT 147.8 07/04/2013   TRIG 72.0 08/15/2014   CHOLHDL 3 08/15/2014     Adequate control on present rx, reviewed > no change in rx needed  = zocor 40 mg daily

## 2014-08-18 NOTE — Assessment & Plan Note (Signed)
Despite pain Adequate control on present rx, reviewed > no change in rx needed  = diet only

## 2014-08-18 NOTE — Assessment & Plan Note (Addendum)
Acute onset 08/08/14 > refractory to nsaids > 08/15/14 rx x depomedrol 120 IM L buttock  and referred back to Dr [name]

## 2014-08-19 DIAGNOSIS — D519 Vitamin B12 deficiency anemia, unspecified: Secondary | ICD-10-CM | POA: Diagnosis not present

## 2014-08-19 DIAGNOSIS — J309 Allergic rhinitis, unspecified: Secondary | ICD-10-CM | POA: Diagnosis not present

## 2014-08-19 NOTE — Telephone Encounter (Signed)
Notes Recorded by Rosana Berger, CMA on 08/16/2014 at 10:51 AM LMTCB Notes Recorded by [name], MD on 08/15/2014 at 5:17 PM Call patient : Study is unremarkable, no change in recsNotes Recorded by Rosana Berger, CMA on 08/16/2014 at 10:51 AM  Pt aware. Chambers Bing, CMA

## 2014-08-20 DIAGNOSIS — D519 Vitamin B12 deficiency anemia, unspecified: Secondary | ICD-10-CM | POA: Diagnosis not present

## 2014-08-21 DIAGNOSIS — D519 Vitamin B12 deficiency anemia, unspecified: Secondary | ICD-10-CM | POA: Diagnosis not present

## 2014-08-22 DIAGNOSIS — M545 Low back pain: Secondary | ICD-10-CM | POA: Diagnosis not present

## 2014-08-22 DIAGNOSIS — M4716 Other spondylosis with myelopathy, lumbar region: Secondary | ICD-10-CM | POA: Diagnosis not present

## 2014-08-22 DIAGNOSIS — M549 Dorsalgia, unspecified: Secondary | ICD-10-CM | POA: Diagnosis not present

## 2014-08-22 DIAGNOSIS — D519 Vitamin B12 deficiency anemia, unspecified: Secondary | ICD-10-CM | POA: Diagnosis not present

## 2014-08-22 DIAGNOSIS — M47896 Other spondylosis, lumbar region: Secondary | ICD-10-CM | POA: Diagnosis not present

## 2014-08-22 DIAGNOSIS — M5416 Radiculopathy, lumbar region: Secondary | ICD-10-CM | POA: Diagnosis not present

## 2014-08-26 DIAGNOSIS — J309 Allergic rhinitis, unspecified: Secondary | ICD-10-CM | POA: Diagnosis not present

## 2014-08-27 DIAGNOSIS — D519 Vitamin B12 deficiency anemia, unspecified: Secondary | ICD-10-CM | POA: Diagnosis not present

## 2014-09-03 DIAGNOSIS — D519 Vitamin B12 deficiency anemia, unspecified: Secondary | ICD-10-CM | POA: Diagnosis not present

## 2014-09-05 DIAGNOSIS — M5126 Other intervertebral disc displacement, lumbar region: Secondary | ICD-10-CM | POA: Diagnosis not present

## 2014-09-05 DIAGNOSIS — M5416 Radiculopathy, lumbar region: Secondary | ICD-10-CM | POA: Diagnosis not present

## 2014-09-11 DIAGNOSIS — D519 Vitamin B12 deficiency anemia, unspecified: Secondary | ICD-10-CM | POA: Diagnosis not present

## 2014-09-18 DIAGNOSIS — D519 Vitamin B12 deficiency anemia, unspecified: Secondary | ICD-10-CM | POA: Diagnosis not present

## 2014-09-20 DIAGNOSIS — M545 Low back pain: Secondary | ICD-10-CM | POA: Diagnosis not present

## 2014-09-20 DIAGNOSIS — Z6830 Body mass index (BMI) 30.0-30.9, adult: Secondary | ICD-10-CM | POA: Diagnosis not present

## 2014-09-20 DIAGNOSIS — M47896 Other spondylosis, lumbar region: Secondary | ICD-10-CM | POA: Diagnosis not present

## 2014-09-20 DIAGNOSIS — M5416 Radiculopathy, lumbar region: Secondary | ICD-10-CM | POA: Diagnosis not present

## 2014-09-25 DIAGNOSIS — D519 Vitamin B12 deficiency anemia, unspecified: Secondary | ICD-10-CM | POA: Diagnosis not present

## 2014-10-01 DIAGNOSIS — Z736 Limitation of activities due to disability: Secondary | ICD-10-CM | POA: Diagnosis not present

## 2014-10-01 DIAGNOSIS — M47896 Other spondylosis, lumbar region: Secondary | ICD-10-CM | POA: Diagnosis not present

## 2014-10-03 DIAGNOSIS — D519 Vitamin B12 deficiency anemia, unspecified: Secondary | ICD-10-CM | POA: Diagnosis not present

## 2014-10-08 DIAGNOSIS — M47896 Other spondylosis, lumbar region: Secondary | ICD-10-CM | POA: Diagnosis not present

## 2014-10-10 DIAGNOSIS — M47896 Other spondylosis, lumbar region: Secondary | ICD-10-CM | POA: Diagnosis not present

## 2014-10-16 DIAGNOSIS — D519 Vitamin B12 deficiency anemia, unspecified: Secondary | ICD-10-CM | POA: Diagnosis not present

## 2014-10-16 DIAGNOSIS — M47896 Other spondylosis, lumbar region: Secondary | ICD-10-CM | POA: Diagnosis not present

## 2014-10-17 ENCOUNTER — Telehealth: Payer: Self-pay | Admitting: *Deleted

## 2014-10-17 NOTE — Telephone Encounter (Signed)
Patient canceled follow up appointment due to possible bad weather she will call to reschedule at a later date

## 2014-10-18 DIAGNOSIS — M47896 Other spondylosis, lumbar region: Secondary | ICD-10-CM | POA: Diagnosis not present

## 2014-10-22 ENCOUNTER — Ambulatory Visit: Payer: Medicare Other | Admitting: Neurology

## 2014-10-23 DIAGNOSIS — M47896 Other spondylosis, lumbar region: Secondary | ICD-10-CM | POA: Diagnosis not present

## 2014-10-24 DIAGNOSIS — M5416 Radiculopathy, lumbar region: Secondary | ICD-10-CM | POA: Diagnosis not present

## 2014-10-24 DIAGNOSIS — M545 Low back pain: Secondary | ICD-10-CM | POA: Diagnosis not present

## 2014-10-24 DIAGNOSIS — M47896 Other spondylosis, lumbar region: Secondary | ICD-10-CM | POA: Diagnosis not present

## 2014-10-25 DIAGNOSIS — M47896 Other spondylosis, lumbar region: Secondary | ICD-10-CM | POA: Diagnosis not present

## 2014-10-30 DIAGNOSIS — M47896 Other spondylosis, lumbar region: Secondary | ICD-10-CM | POA: Diagnosis not present

## 2014-10-30 DIAGNOSIS — D519 Vitamin B12 deficiency anemia, unspecified: Secondary | ICD-10-CM | POA: Diagnosis not present

## 2014-11-01 DIAGNOSIS — M47896 Other spondylosis, lumbar region: Secondary | ICD-10-CM | POA: Diagnosis not present

## 2014-11-06 DIAGNOSIS — M47816 Spondylosis without myelopathy or radiculopathy, lumbar region: Secondary | ICD-10-CM | POA: Diagnosis not present

## 2014-11-08 DIAGNOSIS — M47816 Spondylosis without myelopathy or radiculopathy, lumbar region: Secondary | ICD-10-CM | POA: Diagnosis not present

## 2014-11-13 DIAGNOSIS — D519 Vitamin B12 deficiency anemia, unspecified: Secondary | ICD-10-CM | POA: Diagnosis not present

## 2014-11-13 DIAGNOSIS — M47816 Spondylosis without myelopathy or radiculopathy, lumbar region: Secondary | ICD-10-CM | POA: Diagnosis not present

## 2014-11-14 DIAGNOSIS — J309 Allergic rhinitis, unspecified: Secondary | ICD-10-CM | POA: Diagnosis not present

## 2014-11-15 DIAGNOSIS — M47816 Spondylosis without myelopathy or radiculopathy, lumbar region: Secondary | ICD-10-CM | POA: Diagnosis not present

## 2014-11-20 DIAGNOSIS — M47816 Spondylosis without myelopathy or radiculopathy, lumbar region: Secondary | ICD-10-CM | POA: Diagnosis not present

## 2014-11-21 ENCOUNTER — Other Ambulatory Visit: Payer: Self-pay | Admitting: Neurology

## 2014-11-21 NOTE — Telephone Encounter (Signed)
Rx sent 

## 2014-11-26 DIAGNOSIS — J309 Allergic rhinitis, unspecified: Secondary | ICD-10-CM | POA: Diagnosis not present

## 2014-11-26 DIAGNOSIS — M47816 Spondylosis without myelopathy or radiculopathy, lumbar region: Secondary | ICD-10-CM | POA: Diagnosis not present

## 2014-11-28 DIAGNOSIS — M545 Low back pain: Secondary | ICD-10-CM | POA: Diagnosis not present

## 2014-11-28 DIAGNOSIS — M47896 Other spondylosis, lumbar region: Secondary | ICD-10-CM | POA: Diagnosis not present

## 2014-12-05 DIAGNOSIS — M47816 Spondylosis without myelopathy or radiculopathy, lumbar region: Secondary | ICD-10-CM | POA: Diagnosis not present

## 2014-12-11 DIAGNOSIS — M545 Low back pain: Secondary | ICD-10-CM | POA: Diagnosis not present

## 2014-12-11 DIAGNOSIS — M47896 Other spondylosis, lumbar region: Secondary | ICD-10-CM | POA: Diagnosis not present

## 2014-12-11 DIAGNOSIS — D519 Vitamin B12 deficiency anemia, unspecified: Secondary | ICD-10-CM | POA: Diagnosis not present

## 2014-12-18 DIAGNOSIS — M545 Low back pain: Secondary | ICD-10-CM | POA: Diagnosis not present

## 2014-12-18 DIAGNOSIS — M47896 Other spondylosis, lumbar region: Secondary | ICD-10-CM | POA: Diagnosis not present

## 2014-12-26 DIAGNOSIS — M545 Low back pain: Secondary | ICD-10-CM | POA: Diagnosis not present

## 2014-12-26 DIAGNOSIS — M47896 Other spondylosis, lumbar region: Secondary | ICD-10-CM | POA: Diagnosis not present

## 2015-01-03 DIAGNOSIS — M545 Low back pain: Secondary | ICD-10-CM | POA: Diagnosis not present

## 2015-01-03 DIAGNOSIS — M47896 Other spondylosis, lumbar region: Secondary | ICD-10-CM | POA: Diagnosis not present

## 2015-01-15 DIAGNOSIS — D519 Vitamin B12 deficiency anemia, unspecified: Secondary | ICD-10-CM | POA: Diagnosis not present

## 2015-01-15 DIAGNOSIS — Z1231 Encounter for screening mammogram for malignant neoplasm of breast: Secondary | ICD-10-CM | POA: Diagnosis not present

## 2015-01-22 DIAGNOSIS — D519 Vitamin B12 deficiency anemia, unspecified: Secondary | ICD-10-CM | POA: Diagnosis not present

## 2015-01-24 DIAGNOSIS — H5213 Myopia, bilateral: Secondary | ICD-10-CM | POA: Diagnosis not present

## 2015-01-24 DIAGNOSIS — H35033 Hypertensive retinopathy, bilateral: Secondary | ICD-10-CM | POA: Diagnosis not present

## 2015-01-24 DIAGNOSIS — H2513 Age-related nuclear cataract, bilateral: Secondary | ICD-10-CM | POA: Diagnosis not present

## 2015-01-31 DIAGNOSIS — M25562 Pain in left knee: Secondary | ICD-10-CM | POA: Diagnosis not present

## 2015-01-31 DIAGNOSIS — M5416 Radiculopathy, lumbar region: Secondary | ICD-10-CM | POA: Diagnosis not present

## 2015-01-31 DIAGNOSIS — M25561 Pain in right knee: Secondary | ICD-10-CM | POA: Diagnosis not present

## 2015-01-31 DIAGNOSIS — M545 Low back pain: Secondary | ICD-10-CM | POA: Diagnosis not present

## 2015-02-11 ENCOUNTER — Encounter: Payer: Self-pay | Admitting: Internal Medicine

## 2015-02-14 ENCOUNTER — Ambulatory Visit (INDEPENDENT_AMBULATORY_CARE_PROVIDER_SITE_OTHER): Payer: Medicare Other | Admitting: Internal Medicine

## 2015-02-14 ENCOUNTER — Other Ambulatory Visit (INDEPENDENT_AMBULATORY_CARE_PROVIDER_SITE_OTHER): Payer: Medicare Other

## 2015-02-14 ENCOUNTER — Encounter: Payer: Self-pay | Admitting: Internal Medicine

## 2015-02-14 VITALS — BP 170/90 | HR 65 | Ht 62.0 in | Wt 162.0 lb

## 2015-02-14 DIAGNOSIS — I10 Essential (primary) hypertension: Secondary | ICD-10-CM

## 2015-02-14 DIAGNOSIS — Z0000 Encounter for general adult medical examination without abnormal findings: Secondary | ICD-10-CM

## 2015-02-14 DIAGNOSIS — M129 Arthropathy, unspecified: Secondary | ICD-10-CM | POA: Diagnosis not present

## 2015-02-14 DIAGNOSIS — E785 Hyperlipidemia, unspecified: Secondary | ICD-10-CM

## 2015-02-14 LAB — LIPID PANEL
Cholesterol: 255 mg/dL — ABNORMAL HIGH (ref 0–200)
HDL: 74.9 mg/dL (ref >39.00)
LDL Cholesterol: 163 mg/dL — ABNORMAL HIGH (ref 0–99)
NonHDL: 180.1
Total CHOL/HDL Ratio: 3
Triglycerides: 84 mg/dL (ref 0.0–149.0)
VLDL: 16.8 mg/dL (ref 0.0–40.0)

## 2015-02-14 LAB — HEPATIC FUNCTION PANEL
ALT: 17 U/L (ref 0–35)
AST: 14 U/L (ref 0–37)
Albumin: 3.9 g/dL (ref 3.5–5.2)
Alkaline Phosphatase: 57 U/L (ref 39–117)
Bilirubin, Direct: 0.1 mg/dL (ref 0.0–0.3)
Total Bilirubin: 0.6 mg/dL (ref 0.2–1.2)
Total Protein: 6.6 g/dL (ref 6.0–8.3)

## 2015-02-14 LAB — BASIC METABOLIC PANEL
BUN: 16 mg/dL (ref 6–23)
CO2: 29 mEq/L (ref 19–32)
Calcium: 9.5 mg/dL (ref 8.4–10.5)
Chloride: 107 mEq/L (ref 96–112)
Creatinine, Ser: 0.85 mg/dL (ref 0.40–1.20)
GFR: 82.32 mL/min (ref >60.00)
Glucose, Bld: 103 mg/dL — ABNORMAL HIGH (ref 70–99)
Potassium: 4 mEq/L (ref 3.5–5.1)
Sodium: 140 mEq/L (ref 135–145)

## 2015-02-14 MED ORDER — SULINDAC 200 MG PO TABS: 200.0000 mg | ORAL_TABLET | Freq: Two times a day (BID) | ORAL | Status: DC

## 2015-02-14 NOTE — Patient Instructions (Addendum)
Stop naprosyn and no salt  For arthritis as needed try sulindac 200 mg twice dialy with meals  Please remember to go to the lab   department downstairs for your tests - we will call you with the results when they are available.  Please schedule a follow up office visit in 2weeks, sooner if needed to see Tammy NP for bp recheck    Add Needs prevnar 13 next ov

## 2015-02-14 NOTE — Progress Notes (Signed)
Quick Note:  Spoke with pt and notified of results per Dr. Wert. Pt verbalized understanding and denied any questions.  ______ 

## 2015-02-14 NOTE — Progress Notes (Signed)
Subjective:     Patient ID: [name], female   DOB: 1931-07-23    MRN: [id number]   Brief patient profile:  81  ybf remote minimal smoking hx with seasonal rhiinits flares better overall since started on immunotherapy May2008    HPI  03/21/2012 f/u ov/[name] for annual f/u of multiple chronic issues (hyperlipidemia, obesity, rhinitis, djd) cc L hip pain x 6 months, indolent onset, worse when walks but only every week or two and relieved by aleve quickly- rare muscle cramps also. Labs ok x vit D low > rec vit d load and increase maint to 800 units daily   08/02/2012 f/u ov/[name] cc Pt c/o elevated BP readings for the past month. Denies any HA/vision changes. No sob, one anaprox, no more sudafed per Bardelas on clariton   02/21/13 Acute OV  Complains of bilateral feet/leg swelling x1 week. Ankles puffy in afternoon esp.  No associated orthopnea, cough, fever , calf pain/redness, injury or travel.  B/p is elevated today, says she is taking her meds on regular basis without missed doses.  Has been using naproxen on occasion for arthritis pain in shoulders/hands /knees that is chronic.  Eating more salty foods lately. We discussed healthier choices .  rec Increase Ziac 5/ 6.25mg  daily  Low salt diet , low fat diet.  Weight loss.  I will call with lab and xray results.  Avoid NSAIDS-motrin, aleve if possilble.    03/27/2013 f/u ov/[name] re hbp, djd, hyperlipidemia/ annual review Chief Complaint  Patient presents with  . Follow-up    Pt states no changes or new co's today.    no cp, tia, claudication, sob. rec No change rx  Late add note ldl 200 will need to use a trust verify approach ? Pill boxes being used  05/31/2013 f/u ov/[name] re: f/u lipids/bp/ cr Chief Complaint  Patient presents with  . Follow-up    Pt c/o sinus pressure, HA and PND x 2 wks. She states feels dizzy when she wakes up in the am.  She also c/o dry cough -esp in the am   no longer taking ziac, using ns and  clariton and on shots from allergy/HP with same symptoms that she had in spring resolved on prednisone rec Increase zocor to 40 mg daily   06/24/13  Ov/ NP  No change rx New med calendar   06/13/2014 f/u ov/[name] re: new paresthesias/ no med calendar Chief Complaint  Patient presents with  . Follow-up    Pt c/o tingling pain in her fingers, feet and legs for the past month.      Indolent onset x 2-3 months sym paresthesias feet, ankles, lower ext. Also both hands tingly, feels like her CTS previously but = bilaterally No h/o dm or etohism rec Neuro eval > ? B12 def > rec replacement    08/15/2014 f/u ov/[name] re: hyperlipidemia and hbp  with new back/ leg pain acute x one week  Chief Complaint  Patient presents with  . Follow-up    c/o left thigh pulled muscle 1 wk. ago. Tried Naprosyn,icy hot not helping. Sharp pain at times,constant pain esp. with pr.,PND,no sorethroat,nasal congestion-clear x 2 wks.,sinus pr.,pain in eye area and temples,no fcs. Saw Dr. Posey Pronto starting B12 shots soon.  acute onset p turned around on flat surface while taking pictures in attic of water damage, feeled like she pulled a muscle except the pain radiated from L buttock all the way down  into to L ankle - pain  has persisted since, worse walking, no assoc rash or numbness/  no tia/ claudication symptoms  rec Call Dr [name] today and let him know your back pain is radiating down to your ankle norco one every 4 hours if needed for pain  Depomedrol 120 IM today     02/14/2015 f/u ov/[name] re: hbp / hyperlipidemia / djd / naprosyn  /pred using med calendar well  Chief Complaint  Patient presents with  . Follow-up    Pt states she is doing well overall. BP was elevated today at 170/90.      Not limited by breathing from desired activities / no ex cp/ sob/ claudication.  No h/o tia's  Hip better but now R knee hurting > taking lots of naprosyn   No obvious day to day or daytime variabilty or assoc chronic  cough or sob or cp or chest tightness, subjective wheeze  Or hb symptoms. No unusual exp hx or h/o childhood pna/ asthma or knowledge of premature birth.  Sleeping ok without nocturnal  or early am exacerbation  of respiratory  c/o's or need for noct saba. Also denies any obvious fluctuation of symptoms with weather or environmental changes or other aggravating or alleviating factors except as outlined above   Current Medications, Allergies, Complete Past Medical History, Past Surgical History, Family History, and Social History were reviewed in [employer] record.  ROS  The following are not active complaints unless bolded sore throat, dysphagia, dental problems, itching, sneezing,  nasal congestion or excess/ purulent secretions, ear ache,   fever, chills, sweats, unintended wt loss, pleuritic or exertional cp, hemoptysis,  orthopnea pnd or leg swelling, presyncope, palpitations, heartburn, abdominal pain, anorexia, nausea, vomiting, diarrhea  or change in bowel or urinary habits, change in stools or urine, dysuria,hematuria,  rash, arthralgias, visual complaints, headache, numbness weakness or ataxia or problems with walking or coordination,  change in mood/affect or memory.        Past Medical History:  ARTHRITIS (ICD-716.90)  Right clavicular head swelling .......................Marland KitchenDr Elwin Sleight Hand Pain.................................................Marland KitchenSypher RHINITIS, CHRONIC (ICD-472.0).................. [facility]   - on immunotherapy since 01/2007  HYPERLIPIDEMIA (ICD-272.4)  -target LDL less than 160 > try off due to musle aches March 18, 2010 > rechallenged with half dose fall 2011 and tol ok  MORBID OBESITY (ICD-278.01)  - ideal 142 Target wt = 158 for BMI < 30  COLON POLYPS  - colonoscopy 06/24/2006 with no adenomatous change, hyperplasia only  OSTEOPENIA  -BMD 08/07/08 tspine -1.2, Left femur -.4, right femur +.2  - Repeat 01/02/2010 1.9 0.1 0.5  Health  Maintenance.......................................................Marland KitchenWert  - CPX 03/27/2013  - Td 09/2003 > 03/27/2013  - Pneumovax 2000 age 89  - GYN [name] ([facility])     Family History:  Family History Colon Cancer in mother > neg colon @ f/u 2013 > no further f/u per Dr Olevia Perches  she is an only child and knows nothing about her father's health    Social History:  Patient states former smoker quit age 80  Negative history of passive tobacco smoke exposure.  Exercise-2 times weekly  Caffeine-2 cups daily  Married with 4 children  Risk Factors:  Smoking Status: quit Age 52      Objective:   Physical Exam  wt 172 August 16, 2008 > 173 December 13, 2008 > 166 02/11/2011 >  03/21/2012  169 > 08/02/2012  165> 162 01/11/2013 >164 6/18/ 14 > 03/27/2013  163  > 05/31/2013 165> 06/11/14  163>  08/15/14  165 > 02/14/2015  162   Ambulatory no acute distress, vital signs reviewed >note hbp Skin: no rash or excoriation  HEENT full: dentures, nasal mucosa clear with moderate non-specific turbinate edeam,  no polyps. Nl external ear canals  Neck without JVD/Nodes/TM  Lungs clear  To a and p  RRR no s3 or murmur or increase in P2. tr edema bilateral, mild venous insufficiency changes,   neg homans sign, no calf pain/tenderness Abd soft and benign  Ext warm without calf tenderness, cyanosis clubbing  Neuro :  Nl gait, neg rhomberg , decreased distal reflexes but no objective findings MS FROM all major joints / some crepitance R knee   Labs ordered/ reviewed    Chemistry      Component Value Date/Time   NA 140 02/14/2015 1123   K 4.0 02/14/2015 1123   CL 107 02/14/2015 1123   CO2 29 02/14/2015 1123   BUN 16 02/14/2015 1123   CREATININE 0.85 02/14/2015 1123   CREATININE 0.82 07/16/2014 0947      Component Value Date/Time   CALCIUM 9.5 02/14/2015 1123   ALKPHOS 57 02/14/2015 1123   AST 14 02/14/2015 1123   ALT 17 02/14/2015 1123   BILITOT 0.6 02/14/2015 1123       Assessment:

## 2015-02-16 ENCOUNTER — Encounter: Payer: Self-pay | Admitting: Internal Medicine

## 2015-02-16 NOTE — Assessment & Plan Note (Addendum)
Trial of sulindac for new R knee pain with crepitance c/w djd >> f/u Cohen prn

## 2015-02-16 NOTE — Assessment & Plan Note (Signed)
Up on naprosyn but renal fxn fine  rec no salt and  avoid nsaids in favor of sulindac which spares renal prostaglandins/ f/u in 2 weeks

## 2015-02-16 NOTE — Assessment & Plan Note (Signed)
-   ideal 142 Target wt = 158 for BMI < 30   Body mass index is 29.62 kg/(m^2).  Making slow progress

## 2015-02-16 NOTE — Assessment & Plan Note (Signed)
-  target LDL less than 160 > try off statins due to muscle aches March 18, 2010 > rechallenged with half dose fall 2011 and tol ok  - rechallenged with zorcor 40 mg daily 05/31/2013 >>>  Lab Results  Component Value Date   CHOL 255* 02/14/2015   HDL 74.90 02/14/2015   LDLCALC 163* 02/14/2015   LDLDIRECT 147.8 07/04/2013   TRIG 84.0 02/14/2015   CHOLHDL 3 02/14/2015    Adequate control on present rx, reviewed > no change in rx needed

## 2015-02-16 NOTE — Assessment & Plan Note (Signed)
Needs prevnar 13 next ov

## 2015-02-20 DIAGNOSIS — J309 Allergic rhinitis, unspecified: Secondary | ICD-10-CM | POA: Diagnosis not present

## 2015-02-26 ENCOUNTER — Ambulatory Visit: Payer: Medicare Other | Admitting: Adult Health

## 2015-02-26 DIAGNOSIS — Z6830 Body mass index (BMI) 30.0-30.9, adult: Secondary | ICD-10-CM | POA: Diagnosis not present

## 2015-02-26 DIAGNOSIS — M25561 Pain in right knee: Secondary | ICD-10-CM | POA: Diagnosis not present

## 2015-02-26 DIAGNOSIS — I10 Essential (primary) hypertension: Secondary | ICD-10-CM | POA: Diagnosis not present

## 2015-02-26 DIAGNOSIS — M5416 Radiculopathy, lumbar region: Secondary | ICD-10-CM | POA: Diagnosis not present

## 2015-02-27 ENCOUNTER — Ambulatory Visit (INDEPENDENT_AMBULATORY_CARE_PROVIDER_SITE_OTHER): Payer: Medicare Other | Admitting: Internal Medicine

## 2015-02-27 ENCOUNTER — Encounter: Payer: Self-pay | Admitting: Internal Medicine

## 2015-02-27 ENCOUNTER — Other Ambulatory Visit: Payer: Self-pay | Admitting: Internal Medicine

## 2015-02-27 VITALS — BP 132/72 | HR 66 | Ht 62.0 in | Wt 161.0 lb

## 2015-02-27 DIAGNOSIS — Z23 Encounter for immunization: Secondary | ICD-10-CM

## 2015-02-27 DIAGNOSIS — I10 Essential (primary) hypertension: Secondary | ICD-10-CM

## 2015-02-27 DIAGNOSIS — M129 Arthropathy, unspecified: Secondary | ICD-10-CM | POA: Diagnosis not present

## 2015-02-27 NOTE — Progress Notes (Signed)
Subjective:     Patient ID: [name], female   DOB: 06-Jun-1931    MRN: [id number]   Brief patient profile:  81  ybf remote minimal smoking hx with seasonal rhiinits flares better overall since started on immunotherapy May2008    HPI  03/21/2012 f/u ov/Wert for annual f/u of multiple chronic issues (hyperlipidemia, obesity, rhinitis, djd) cc L hip pain x 6 months, indolent onset, worse when walks but only every week or two and relieved by aleve quickly- rare muscle cramps also. Labs ok x vit D low > rec vit d load and increase maint to 800 units daily   08/02/2012 f/u ov/Wert cc Pt c/o elevated BP readings for the past month. Denies any HA/vision changes. No sob, one anaprox, no more sudafed per Bardelas on clariton   02/21/13 Acute OV  Complains of bilateral feet/leg swelling x1 week. Ankles puffy in afternoon esp.  No associated orthopnea, cough, fever , calf pain/redness, injury or travel.  B/p is elevated today, says she is taking her meds on regular basis without missed doses.  Has been using naproxen on occasion for arthritis pain in shoulders/hands /knees that is chronic.  Eating more salty foods lately. We discussed healthier choices .  rec Increase Ziac 5/ 6.25mg  daily  Low salt diet , low fat diet.  Weight loss.  I will call with lab and xray results.  Avoid NSAIDS-motrin, aleve if possilble.    03/27/2013 f/u ov/Wert re hbp, djd, hyperlipidemia/ annual review Chief Complaint  Patient presents with  . Follow-up    Pt states no changes or new co's today.    no cp, tia, claudication, sob. rec No change rx  Late add note ldl 200 will need to use a trust verify approach ? Pill boxes being used  05/31/2013 f/u ov/Wert re: f/u lipids/bp/ cr Chief Complaint  Patient presents with  . Follow-up    Pt c/o sinus pressure, HA and PND x 2 wks. She states feels dizzy when she wakes up in the am.  She also c/o dry cough -esp in the am   no longer taking ziac, using ns and  clariton and on shots from allergy/HP with same symptoms that she had in spring resolved on prednisone rec Increase zocor to 40 mg daily   06/24/13  Ov/ NP  No change rx New med calendar   06/13/2014 f/u ov/Wert re: new paresthesias/ no med calendar Chief Complaint  Patient presents with  . Follow-up    Pt c/o tingling pain in her fingers, feet and legs for the past month.      Indolent onset x 2-3 months sym paresthesias feet, ankles, lower ext. Also both hands tingly, feels like her CTS previously but = bilaterally No h/o dm or etohism rec Neuro eval > ? B12 def > rec replacement    08/15/2014 f/u ov/Wert re: hyperlipidemia and hbp  with new back/ leg pain acute x one week  Chief Complaint  Patient presents with  . Follow-up    c/o left thigh pulled muscle 1 wk. ago. Tried Naprosyn,icy hot not helping. Sharp pain at times,constant pain esp. with pr.,PND,no sorethroat,nasal congestion-clear x 2 wks.,sinus pr.,pain in eye area and temples,no fcs. Saw Dr. Posey Pronto starting B12 shots soon.  acute onset p turned around on flat surface while taking pictures in attic of water damage, feeled like she pulled a muscle except the pain radiated from L buttock all the way down  into to L ankle - pain  has persisted since, worse walking, no assoc rash or numbness/  no tia/ claudication symptoms  rec Call Dr [name] today and let him know your back pain is radiating down to your ankle norco one every 4 hours if needed for pain  Depomedrol 120 IM today     02/14/2015 f/u ov/Wert re: hbp / hyperlipidemia / djd / naprosyn  /pred using med calendar well  Chief Complaint  Patient presents with  . Follow-up    Pt states she is doing well overall. BP was elevated today at 170/90.     Hip better but now R knee hurting > taking lots of naprosyn  rec Stop naprosyn and no salt For arthritis as needed try sulindac 200 mg twice dialy with meals   02/27/2015 f/u ov/Wert re:  Chief Complaint  Patient  presents with  . Follow-up    Pt states her BP has been doing well since the last visit. Sulindac works well for her arthritic pain.      No obvious day to day or daytime variabilty or assoc chronic cough or sob or cp or chest tightness, subjective wheeze  Or hb symptoms. No unusual exp hx or h/o childhood pna/ asthma or knowledge of premature birth.  Sleeping ok without nocturnal  or early am exacerbation  of respiratory  c/o's or need for noct saba. Also denies any obvious fluctuation of symptoms with weather or environmental changes or other aggravating or alleviating factors except as outlined above   Current Medications, Allergies, Complete Past Medical History, Past Surgical History, Family History, and Social History were reviewed in [employer] record.  ROS  The following are not active complaints unless bolded sore throat, dysphagia, dental problems, itching, sneezing,  nasal congestion or excess/ purulent secretions, ear ache,   fever, chills, sweats, unintended wt loss, pleuritic or exertional cp, hemoptysis,  orthopnea pnd or leg swelling, presyncope, palpitations, heartburn, abdominal pain, anorexia, nausea, vomiting, diarrhea  or change in bowel or urinary habits, change in stools or urine, dysuria,hematuria,  rash, arthralgias better R knee , visual complaints, headache, numbness weakness or ataxia or problems with walking or coordination,  change in mood/affect or memory.        Past Medical History:  ARTHRITIS (ICD-716.90)  Right clavicular head swelling .......................Marland KitchenDr Elwin Sleight Hand Pain.................................................Marland KitchenSypher RHINITIS, CHRONIC (ICD-472.0).................. [facility]   - on immunotherapy since 01/2007  HYPERLIPIDEMIA (ICD-272.4)  -target LDL less than 160 > try off due to musle aches March 18, 2010 > rechallenged with half dose fall 2011 and tol ok  MORBID OBESITY (ICD-278.01)  - ideal 142 Target wt =  158 for BMI < 30  COLON POLYPS  - colonoscopy 06/24/2006 with no adenomatous change, hyperplasia only  OSTEOPENIA  -BMD 08/07/08 tspine -1.2, Left femur -.4, right femur +.2  - Repeat 01/02/2010 1.9 0.1 0.5  Health Maintenance.......................................................Marland KitchenWert  - CPX 03/27/2013  - Td 09/2003 > 03/27/2013  - Pneumovax 2000 age 60  - GYN [name] ([facility] [place])     Family History:  Family History Colon Cancer in mother > neg colon @ f/u 2013 > no further f/u per Dr Olevia Perches  she is an only child and knows nothing about her father's health    Social History:  Patient states former smoker quit age 78  Negative history of passive tobacco smoke exposure.  Exercise-2 times weekly  Caffeine-2 cups daily  Married with 4 children  Risk Factors:  Smoking Status: quit Age 35  Objective:   Physical Exam  wt 172 August 16, 2008 > 173 December 13, 2008 > 166 02/11/2011 >  03/21/2012  169 > 08/02/2012  165> 162 01/11/2013 >164 6/18/ 14 > 03/27/2013  163  > 05/31/2013 165> 06/11/14 163>  08/15/14  165 > 02/14/2015  162 > 02/27/2015   162   Ambulatory no acute distress, vital signs reviewed  > BP now nl  Skin: no rash or excoriation  HEENT full: dentures, nasal mucosa clear with moderate non-specific turbinate edeam,  no polyps. Nl external ear canals  Neck without JVD/Nodes/TM  Lungs clear  To a and p  RRR no s3 or murmur or increase in P2. tr edema bilateral, mild venous insufficiency changes,   neg homans sign, no calf pain/tenderness Abd soft and benign  Ext warm without calf tenderness, cyanosis clubbing  Neuro :  Nl gait, neg rhomberg , decreased distal reflexes but no objective findings MS FROM all major joints / min  crepitance R knee   Labs ordered/ reviewed    Chemistry      Component Value Date/Time   NA 140 02/14/2015 1123   K 4.0 02/14/2015 1123   CL 107 02/14/2015 1123   CO2 29 02/14/2015 1123   BUN 16 02/14/2015 1123   CREATININE 0.85 02/14/2015 1123    CREATININE 0.82 07/16/2014 0947      Component Value Date/Time   CALCIUM 9.5 02/14/2015 1123   ALKPHOS 57 02/14/2015 1123   AST 14 02/14/2015 1123   ALT 17 02/14/2015 1123   BILITOT 0.6 02/14/2015 1123       Assessment:

## 2015-02-27 NOTE — Patient Instructions (Signed)
Prevnar 13 today   The sulindac is as needed up to twice daily but always with meals  Please schedule a follow up visit in 3 months but call sooner if needed - to see Tammy NP

## 2015-03-02 ENCOUNTER — Encounter: Payer: Self-pay | Admitting: Internal Medicine

## 2015-03-02 NOTE — Assessment & Plan Note (Signed)
- ?   R Rotator cuff problem 08/25/2011 > referred back to [name] - changed to clinoril 02/14/15 due to hbp   Adequate control on present rx, reviewed > no change in rx needed  > f/u [name]

## 2015-03-02 NOTE — Assessment & Plan Note (Signed)
Better off salt and naprosyn > Adequate control on present rx, reviewed > no change in rx needed  = no meds

## 2015-03-05 DIAGNOSIS — J309 Allergic rhinitis, unspecified: Secondary | ICD-10-CM | POA: Diagnosis not present

## 2015-03-20 DIAGNOSIS — J309 Allergic rhinitis, unspecified: Secondary | ICD-10-CM | POA: Diagnosis not present

## 2015-03-27 DIAGNOSIS — J309 Allergic rhinitis, unspecified: Secondary | ICD-10-CM | POA: Diagnosis not present

## 2015-04-16 DIAGNOSIS — D519 Vitamin B12 deficiency anemia, unspecified: Secondary | ICD-10-CM | POA: Diagnosis not present

## 2015-04-24 DIAGNOSIS — J309 Allergic rhinitis, unspecified: Secondary | ICD-10-CM | POA: Diagnosis not present

## 2015-05-09 ENCOUNTER — Ambulatory Visit (INDEPENDENT_AMBULATORY_CARE_PROVIDER_SITE_OTHER): Payer: Medicare Other

## 2015-05-09 DIAGNOSIS — Z23 Encounter for immunization: Secondary | ICD-10-CM | POA: Diagnosis not present

## 2015-05-22 DIAGNOSIS — D519 Vitamin B12 deficiency anemia, unspecified: Secondary | ICD-10-CM | POA: Diagnosis not present

## 2015-05-23 DIAGNOSIS — J3089 Other allergic rhinitis: Secondary | ICD-10-CM | POA: Insufficient documentation

## 2015-05-25 DIAGNOSIS — J309 Allergic rhinitis, unspecified: Secondary | ICD-10-CM | POA: Diagnosis not present

## 2015-05-28 ENCOUNTER — Ambulatory Visit (INDEPENDENT_AMBULATORY_CARE_PROVIDER_SITE_OTHER): Payer: Medicare Other | Admitting: Adult Health

## 2015-05-28 ENCOUNTER — Encounter: Payer: Self-pay | Admitting: Adult Health

## 2015-05-28 VITALS — BP 130/82 | HR 62 | Temp 97.0°F | Ht 62.0 in | Wt 161.0 lb

## 2015-05-28 DIAGNOSIS — M129 Arthropathy, unspecified: Secondary | ICD-10-CM | POA: Diagnosis not present

## 2015-05-28 DIAGNOSIS — I10 Essential (primary) hypertension: Secondary | ICD-10-CM

## 2015-05-28 DIAGNOSIS — E785 Hyperlipidemia, unspecified: Secondary | ICD-10-CM

## 2015-05-28 NOTE — Progress Notes (Signed)
Chart and office note reviewed in detail  > agree with a/p as outlined    

## 2015-05-28 NOTE — Assessment & Plan Note (Signed)
DJD -improved on NSAIDS Exercise as able

## 2015-05-28 NOTE — Progress Notes (Signed)
Subjective:     Patient ID: [name], female   DOB: 1930/09/22    MRN: [id number]   Brief patient profile:  81 ybf remote minimal smoking hx with seasonal rhiinits flares better overall since started on immunotherapy May2008 ybf remote minimal smoking hx with seasonal rhiinits flares better overall since started on immunotherapy May2008etter overall since started on immunotherapy May2008    HPI  03/21/2012 f/u ov/Wert for annual f/u of multiple chronic issues (hyperlipidemia, obesity, rhinitis, djd) cc L hip pain x 6 months, indolent onset, worse when walks but only every week or two and relieved by aleve quickly- rare muscle cramps also. Labs ok x vit D low > rec vit d load and increase maint to 800 units daily   08/02/2012 f/u ov/Wert cc Pt c/o elevated BP readings for the past month. Denies any HA/vision changes. No sob, one anaprox, no more sudafed per Bardelas on clariton   02/21/13 Acute OV  Complains of bilateral feet/leg swelling x1 week. Ankles puffy in afternoon esp.  No associated orthopnea, cough, fever , calf pain/redness, injury or travel.  B/p is elevated today, says she is taking her meds on regular basis without missed doses.  Has been using naproxen on occasion for arthritis pain in shoulders/hands /knees that is chronic.  Eating more salty foods lately. We discussed healthier choices .  rec Increase Ziac 5/ 6.25mg  daily  Low salt diet , low fat diet.  Weight loss.  I will call with lab and xray results.  Avoid NSAIDS-motrin, aleve if possilble.    03/27/2013 f/u ov/Wert re hbp, djd, hyperlipidemia/ annual review Chief Complaint  Patient presents with  . Follow-up    Pt states no changes or new co's today.    no cp, tia, claudication, sob. rec No change rx  Late add note ldl 200 will need to use a trust verify approach ? Pill boxes being used  05/31/2013 f/u ov/Wert re: f/u lipids/bp/ cr Chief Complaint  Patient presents with  . Follow-up    Pt c/o sinus pressure, HA and PND x 2 wks. She states feels dizzy when she wakes up in the am.  She also c/o dry cough -esp in the am   no longer taking ziac, using ns and  clariton and on shots from allergy/HP with same symptoms that she had in spring resolved on prednisone rec Increase zocor to 40 mg daily   06/24/13  Ov/ NP  No change rx New med calendar   06/13/2014 f/u ov/Wert re: new paresthesias/ no med calendar Chief Complaint  Patient presents with  . Follow-up    Pt c/o tingling pain in her fingers, feet and legs for the past month.      Indolent onset x 2-3 months sym paresthesias feet, ankles, lower ext. Also both hands tingly, feels like her CTS previously but = bilaterally No h/o dm or etohism rec Neuro eval > ? B12 def > rec replacement    08/15/2014 f/u ov/Wert re: hyperlipidemia and hbp  with new back/ leg pain acute x one week  Chief Complaint  Patient presents with  . Follow-up    c/o left thigh pulled muscle 1 wk. ago. Tried Naprosyn,icy hot not helping. Sharp pain at times,constant pain esp. with pr.,PND,no sorethroat,nasal congestion-clear x 2 wks.,sinus pr.,pain in eye area and temples,no fcs. Saw Dr. Posey Pronto starting B12 shots soon.  acute onset p turned around on flat surface while taking pictures in attic of water damage, feeled like she pulled a muscle except the pain radiated from L buttock all the way down  into to L ankle - pain  has persisted since, worse walking, no assoc rash or numbness/  no tia/ claudication symptoms  rec Call Dr [name] today and let him know your back pain is radiating down to your ankle norco one every 4 hours if needed for pain  Depomedrol 120 IM today     02/14/2015 f/u ov/Wert re: hbp / hyperlipidemia / djd / naprosyn  /pred using med calendar well  Chief Complaint  Patient presents with  . Follow-up    Pt states she is doing well overall. BP was elevated today at 170/90.     Hip better but now R knee hurting > taking lots of naprosyn  rec Stop naprosyn and no salt For arthritis as needed try sulindac 200 mg twice dialy with meals   02/27/2015 f/u ov/Wert re:  Chief Complaint  Patient  presents with  . Follow-up    Pt states her BP has been doing well since the last visit. Sulindac works well for her arthritic pain.   >>sulindac rx for djd   05/28/2015 Follow Up : HTN /Hyperlipdemia / DJD / [facility] [facility]  Pt. Returns today for 3 month follow up. Pt has borderline HTN in past.   We discussed low salt diet, and limited use of NSAIDS to minimize sodium retention. B/p is controlled today at 130/82.   We reviewed labwork from June 2016 which indicated elevated cholesterol. We discussed whether or not she is taking her Zocor as prescribed. She stated that she is currently not taking the medication as prescribed, and will try to do better. We explained that the medication prevents plaque build up, and it is beneficial when taken correctly. We reviewed that her kidney and liver functions per the June labs are WNL.  Her arthritis is better controlled on the Sulindac, which she stated she takes only when necessary. She uses rubs when possible. She is very active, stood for 6 hours yesterday and was not tired.  Denies chest pain, orthopnea, headaches, edema.  Flu vaccine utd .        Current Medications, Allergies, Complete Past Medical History, Past Surgical History, Family History, and Social History were reviewed in [employer] record.  ROS  The following are not active complaints unless bolded sore throat, dysphagia, dental problems, itching, sneezing,  nasal congestion or excess/ purulent secretions, ear ache,   fever, chills, sweats, unintended wt loss, pleuritic or exertional cp, hemoptysis,  orthopnea pnd or leg swelling, presyncope, palpitations, heartburn, abdominal pain, anorexia, nausea, vomiting, diarrhea  or change in bowel or urinary habits, change in stools or urine, dysuria,hematuria,  rash, arthralgias better R knee , visual complaints, headache, numbness weakness or ataxia or problems with walking or coordination,  change in mood/affect or  memory.        Past Medical History:  ARTHRITIS (ICD-716.90)  Right clavicular head swelling .......................Marland KitchenDr Elwin Sleight Hand Pain.................................................Marland KitchenSypher RHINITIS, CHRONIC (ICD-472.0).................. [facility]   - on immunotherapy since 01/2007  HYPERLIPIDEMIA (ICD-272.4)  -target LDL less than 160 > try off due to musle aches March 18, 2010 > rechallenged with half dose fall 2011 and tol ok  MORBID OBESITY (ICD-278.01)  - ideal 142 Target wt = 158 for BMI < 30  COLON POLYPS  - colonoscopy 06/24/2006 with no adenomatous change, hyperplasia only  OSTEOPENIA  -BMD 08/07/08 tspine -1.2, Left femur -.4, right femur +.2  - Repeat 01/02/2010 1.9 0.1 0.5  Health Maintenance.......................................................Marland KitchenWert  - CPX 03/27/2013  - Td 09/2003 > 03/27/2013  -  Pneumovax 2000 age 57  - GYN [name] ([place])     Family History:  Family History Colon Cancer in mother > neg colon @ f/u 2013 > no further f/u per Dr Olevia Perches  she is an only child and knows nothing about her father's health    Social History:  Patient states former smoker quit age 81  Negative history of passive tobacco smoke exposure.  Exercise-2 times weekly  Caffeine-2 cups daily  Married with 4 children  Risk Factors:  Smoking Status: quit Age 14      Objective:   Physical Exam  wt 172 August 16, 2008 > 173 December 13, 2008 > 166 02/11/2011 >  03/21/2012  169 > 08/02/2012  165> 162 01/11/2013 >164 6/18/ 14 > 03/27/2013  163  > 05/31/2013 165> 06/11/14 163>  08/15/14  165 > 02/14/2015  162 > 02/27/2015   162 > 05/28/15 161  Ambulatory no acute distress, vital signs reviewed    Skin: no rash or excoriation  HEENT full: dentures, nasal mucosa clear with moderate non-specific turbinate edeam,  no polyps. Nl external ear canals  Neck without JVD/Nodes/TM  Lungs clear  To a and p  RRR no s3 or murmur or increase in P2. tr edema bilateral, mild venous  insufficiency changes,   neg homans sign, no calf pain/tenderness Abd soft and benign  Ext warm without calf tenderness, cyanosis clubbing  Neuro :  Nl gait, neg rhomberg , decreased distal reflexes but no objective findings MS no deformities noted   Labs ordered/ reviewed    Chemistry      Component Value Date/Time   NA 140 02/14/2015 1123   K 4.0 02/14/2015 1123   CL 107 02/14/2015 1123   CO2 29 02/14/2015 1123   BUN 16 02/14/2015 1123   CREATININE 0.85 02/14/2015 1123   CREATININE 0.82 07/16/2014 0947      Component Value Date/Time   CALCIUM 9.5 02/14/2015 1123   ALKPHOS 57 02/14/2015 1123   AST 14 02/14/2015 1123   ALT 17 02/14/2015 1123   BILITOT 0.6 02/14/2015 1123       Assessment:

## 2015-05-28 NOTE — Assessment & Plan Note (Signed)
Uncontrolled due to medication /diet compliance   Plan  Low fat cholesterol salt diet .  Take Simvastatin  Everyday , do not skip  Follow up Dr. [name]  In 3-4 months for fasting lipid panel .

## 2015-05-28 NOTE — Patient Instructions (Signed)
Low fat cholesterol salt diet .  Take Simvastatin  Everyday , do not skip  Follow up Dr. [name]  In 3-4 months for fasting lipid panel .

## 2015-05-28 NOTE — Assessment & Plan Note (Signed)
Borderline HTN. Controlled with diet and exercise.  Plan Low salt diet  Minimal NSAID use As needed

## 2015-06-19 ENCOUNTER — Ambulatory Visit (INDEPENDENT_AMBULATORY_CARE_PROVIDER_SITE_OTHER): Payer: Medicare Other | Admitting: *Deleted

## 2015-06-19 DIAGNOSIS — J309 Allergic rhinitis, unspecified: Secondary | ICD-10-CM | POA: Diagnosis not present

## 2015-08-05 ENCOUNTER — Ambulatory Visit (INDEPENDENT_AMBULATORY_CARE_PROVIDER_SITE_OTHER): Payer: Medicare Other | Admitting: *Deleted

## 2015-08-05 DIAGNOSIS — L304 Erythema intertrigo: Secondary | ICD-10-CM | POA: Diagnosis not present

## 2015-08-05 DIAGNOSIS — E538 Deficiency of other specified B group vitamins: Secondary | ICD-10-CM

## 2015-08-05 DIAGNOSIS — J309 Allergic rhinitis, unspecified: Secondary | ICD-10-CM

## 2015-08-05 DIAGNOSIS — L720 Epidermal cyst: Secondary | ICD-10-CM | POA: Diagnosis not present

## 2015-08-05 MED ORDER — CYANOCOBALAMIN 1000 MCG/ML IJ SOLN
1000.0000 ug | INTRAMUSCULAR | Status: DC
  Administered 2015-08-05: 1000 ug via INTRAMUSCULAR

## 2015-08-12 ENCOUNTER — Encounter: Payer: Self-pay | Admitting: Internal Medicine

## 2015-08-12 ENCOUNTER — Other Ambulatory Visit (INDEPENDENT_AMBULATORY_CARE_PROVIDER_SITE_OTHER): Payer: Medicare Other

## 2015-08-12 ENCOUNTER — Ambulatory Visit (INDEPENDENT_AMBULATORY_CARE_PROVIDER_SITE_OTHER): Payer: Medicare Other | Admitting: Internal Medicine

## 2015-08-12 VITALS — BP 146/82 | HR 75 | Ht 62.0 in | Wt 160.0 lb

## 2015-08-12 DIAGNOSIS — M542 Cervicalgia: Secondary | ICD-10-CM

## 2015-08-12 DIAGNOSIS — E785 Hyperlipidemia, unspecified: Secondary | ICD-10-CM

## 2015-08-12 DIAGNOSIS — R202 Paresthesia of skin: Secondary | ICD-10-CM

## 2015-08-12 LAB — HEPATIC FUNCTION PANEL
ALT: 15 U/L (ref 0–35)
AST: 17 U/L (ref 0–37)
Albumin: 4 g/dL (ref 3.5–5.2)
Alkaline Phosphatase: 64 U/L (ref 39–117)
Bilirubin, Direct: 0 mg/dL (ref 0.0–0.3)
Total Bilirubin: 0.5 mg/dL (ref 0.2–1.2)
Total Protein: 7 g/dL (ref 6.0–8.3)

## 2015-08-12 LAB — VITAMIN B12: Vitamin B-12: 470 pg/mL (ref 211–911)

## 2015-08-12 LAB — LIPID PANEL
Cholesterol: 206 mg/dL — ABNORMAL HIGH (ref 0–200)
HDL: 73.2 mg/dL (ref >39.00)
LDL Cholesterol: 119 mg/dL — ABNORMAL HIGH (ref 0–99)
NonHDL: 132.54
Total CHOL/HDL Ratio: 3
Triglycerides: 69 mg/dL (ref 0.0–149.0)
VLDL: 13.8 mg/dL (ref 0.0–40.0)

## 2015-08-12 LAB — TSH: TSH: 2.91 u[IU]/mL (ref 0.35–4.50)

## 2015-08-12 MED ORDER — CYANOCOBALAMIN 1000 MCG/ML IJ SOLN: 1000.0000 ug | INTRAMUSCULAR | Status: DC

## 2015-08-12 NOTE — Progress Notes (Signed)
Quick Note:  Spoke with pt and notified of results per Dr. Wert. Pt verbalized understanding and denied any questions.  ______ 

## 2015-08-12 NOTE — Patient Instructions (Signed)
Please remember to go to the lab department downstairs for your tests - we will call you with the results when they are available.  Do your neck stretching 4  Sets  daily doing each set twice, use heat and sulindac as you are and see Dr [name] if not better in 2 weeks   Please schedule a follow up visit in 3 months but call sooner if needed

## 2015-08-12 NOTE — Progress Notes (Signed)
Subjective:     Patient ID: [name], female   DOB: 15-Jun-1931    MRN: [id number]   Brief patient profile:  81  ybf remote minimal smoking hx with seasonal rhiinits flares better overall since started on immunotherapy May2008  Followed in pulmonary clinic with hyperlipidemia/djd   HPI  03/21/2012 f/u ov/[name] for annual f/u of multiple chronic issues (hyperlipidemia, obesity, rhinitis, djd) cc L hip pain x 6 months, indolent onset, worse when walks but only every week or two and relieved by aleve quickly- rare muscle cramps also. Labs ok x vit D low > rec vit d load and increase maint to 800 units daily   08/02/2012 f/u ov/[name] cc Pt c/o elevated BP readings for the past month. Denies any HA/vision changes. No sob, one anaprox, no more sudafed per [name] on clariton    06/13/2014 f/u ov/[name] re: new paresthesias/ no med calendar Chief Complaint  Patient presents with  . Follow-up    Pt c/o tingling pain in her fingers, feet and legs for the past month.      Indolent onset x 2-3 months sym paresthesias feet, ankles, lower ext. Also both hands tingly, feels like her CTS previously but = bilaterally No h/o dm or etohism rec Neuro eval > ? B12 def > rec replacement         08/12/2015  f/u ov/[name] re: f/u lipids/ b12 def  New problem neck pain  Chief Complaint  Patient presents with  . Follow-up    Pt fasting for labs. She c/o left side neck pain- radiates to shoulder and arm for the past 2 wks.   says after started B12 no more leg problems, now abrupt onset Nov 26th woke up with L neck pain rad to elbow, some better with sulindac and heating pad and no numbness or weakness in L hand   No obvious day to day or daytime variability or assoc chronic cough or cp or chest tightness, subjective wheeze or overt sinus or hb symptoms. No unusual exp hx or h/o childhood pna/ asthma or knowledge of premature birth.  Sleeping ok without nocturnal  or early am exacerbation  of respiratory   c/o's or need for noct saba. Also denies any obvious fluctuation of symptoms with weather or environmental changes or other aggravating or alleviating factors except as outlined above   Current Medications, Allergies, Complete Past Medical History, Past Surgical History, Family History, and Social History were reviewed in [employer] record.  ROS  The following are not active complaints unless bolded sore throat, dysphagia, dental problems, itching, sneezing,  nasal congestion or excess/ purulent secretions, ear ache,   fever, chills, sweats, unintended wt loss, classically pleuritic or exertional cp, hemoptysis,  orthopnea pnd or leg swelling, presyncope, palpitations, abdominal pain, anorexia, nausea, vomiting, diarrhea  or change in bowel or bladder habits, change in stools or urine, dysuria,hematuria,  rash, arthralgias, visual complaints, headache, numbness, weakness or ataxia or problems with walking or coordination,  change in mood/affect or memory.                    Past Medical History:  ARTHRITIS (ICD-716.90)  Right clavicular head swelling .......................Marland KitchenDr Elwin [name] Hand Pain.................................................Marland KitchenSypher RHINITIS, CHRONIC (ICD-472.0).................. [name]   - on immunotherapy since 01/2007  HYPERLIPIDEMIA (ICD-272.4)  -target LDL less than 160 > try off due to musle aches March 18, 2010 > rechallenged with half dose fall 2011 and tol ok  MORBID OBESITY (  ICD-278.01)  - ideal 142 Target wt = 158 for BMI < 30  COLON POLYPS  - colonoscopy 06/24/2006 with no adenomatous change, hyperplasia only  OSTEOPENIA  -BMD 08/07/08 tspine -1.2, Left femur -.4, right femur +.2  - Repeat 01/02/2010 1.9 0.1 0.5  Health Maintenance.......................................................Marland KitchenWert  - CPX 03/27/2013  - Td 09/2003 > 03/27/2013  - Pneumovax 2000 age 81  - [name] ([place])     Family History:  Family History  Colon Cancer in mother > neg colon @ f/u 2013 > no further f/u per Mackenzie [name]  she is an only child and knows nothing about her father's health    Social History:  Patient states former smoker quit age 49  Negative history of passive tobacco smoke exposure.  Exercise-2 times weekly  Caffeine-2 cups daily  Married with 4 children  Risk Factors:  Smoking Status: quit Age 81      Objective:   Physical Exam  wt 172 August 16, 2008 > 173 December 13, 2008 > 166 02/11/2011 >  03/21/2012  169 > 08/02/2012  165> 162 01/11/2013 >164 6/18/ 14 > 03/27/2013  163  > 05/31/2013 165> 06/11/14 163>  08/15/14  165 > 02/14/2015  162 > 02/27/2015   162 > 05/28/15 161> 08/12/2015   160   Ambulatory no acute distress, vital signs reviewed    Skin: no rash or excoriation  HEENT full: dentures, nasal mucosa clear with moderate non-specific turbinate edeam,  no polyps. Nl external ear canals  Neck without JVD/Nodes/TM  Lungs clear  To a and p  RRR no s3 or murmur or increase in P2. tr edema bilateral, mild venous insufficiency changes,   neg homans sign, no calf pain/tenderness Abd soft and benign  Ext warm without calf tenderness, cyanosis clubbing  Neuro :  Nl gait, neg rhomberg , decreased distal reflexes but no objective findings/ good hand grip on L  MS no deformities noted/ pain elicited on R rot of neck/ abd to R > radiates to L shoulder   Labs ordered/ reviewed 08/12/2015  B12/ tsh/lipids    Assessment:

## 2015-08-13 ENCOUNTER — Encounter: Payer: Self-pay | Admitting: Internal Medicine

## 2015-08-13 DIAGNOSIS — M542 Cervicalgia: Secondary | ICD-10-CM | POA: Insufficient documentation

## 2015-08-13 NOTE — Assessment & Plan Note (Signed)
Onset summer of 2015 - referred to neurology 06/11/2014  > seen 07/16/14 no specific dx but b12 low > symptoms resolved on monthly B12   Lab Results  Component Value Date   VITAMINB12 470 08/12/2015    Adequate control on present rx, reviewed > no change in rx needed

## 2015-08-13 NOTE — Assessment & Plan Note (Signed)
Classic muscle strain pattern s radicular features > rx with sulindac/ heat/ stretching> to ortho in 2 weeks if not better    I had an extended discussion with the patient reviewing all relevant studies completed to date and  lasting 15 to 20 minutes of a 25 minute visit    Each maintenance medication was reviewed in detail including most importantly the difference between maintenance and prns and under what circumstances the prns are to be triggered using an action plan format that is not reflected in the computer generated alphabetically organized AVS.    Please see instructions for details which were reviewed in writing and the patient given a copy highlighting the part that I personally wrote and discussed at today's ov.

## 2015-08-13 NOTE — Assessment & Plan Note (Addendum)
-  target LDL less than 160 > try off statins due to muscle aches March 18, 2010 > rechallenged with half dose fall 2011 and tol ok  - rechallenged with zorcor 40 mg daily 05/31/2013 >>>  Lab Results  Component Value Date   CHOL 206* 08/12/2015   HDL 73.20 08/12/2015   LDLCALC 119* 08/12/2015   LDLDIRECT 147.8 07/04/2013   TRIG 69.0 08/12/2015   CHOLHDL 3 08/12/2015     Adequate control on present rx, reviewed > no change in rx needed

## 2015-10-16 ENCOUNTER — Telehealth: Payer: Self-pay | Admitting: *Deleted

## 2015-10-16 ENCOUNTER — Encounter: Payer: Self-pay | Admitting: *Deleted

## 2015-10-16 NOTE — Progress Notes (Signed)
This encounter was created in error - please disregard.

## 2015-10-16 NOTE — Telephone Encounter (Signed)
Patient came in for allergy injections she has not received one since August 05, 2015. Also, has not had Vitamin B12 shot please advise how we should proceed. Chart put on your desk. Patient aware she will not be getting a call until Tuesday 10/21/15.

## 2015-10-21 NOTE — Telephone Encounter (Signed)
Spoke with Dr Shaune Leeks states patient to be restarted at 0.1 on both red vials and build back up to 0.5 also with the vit b12 shot ok to give same dose. Writer documented this in allergy injection flowsheet.

## 2015-11-03 ENCOUNTER — Ambulatory Visit (INDEPENDENT_AMBULATORY_CARE_PROVIDER_SITE_OTHER): Payer: Medicare Other | Admitting: *Deleted

## 2015-11-03 DIAGNOSIS — J309 Allergic rhinitis, unspecified: Secondary | ICD-10-CM | POA: Diagnosis not present

## 2015-11-03 MED ORDER — EPINEPHRINE 0.3 MG/0.3ML IJ SOAJ: 0.3000 mg | Freq: Once | INTRAMUSCULAR | Status: DC

## 2015-11-13 ENCOUNTER — Encounter: Payer: Self-pay | Admitting: Internal Medicine

## 2015-11-13 ENCOUNTER — Ambulatory Visit (INDEPENDENT_AMBULATORY_CARE_PROVIDER_SITE_OTHER): Payer: Medicare Other | Admitting: Internal Medicine

## 2015-11-13 ENCOUNTER — Ambulatory Visit (INDEPENDENT_AMBULATORY_CARE_PROVIDER_SITE_OTHER): Payer: Medicare Other | Admitting: *Deleted

## 2015-11-13 VITALS — BP 122/70 | HR 74 | Ht 62.0 in | Wt 159.8 lb

## 2015-11-13 DIAGNOSIS — J310 Chronic rhinitis: Secondary | ICD-10-CM | POA: Diagnosis not present

## 2015-11-13 DIAGNOSIS — M129 Arthropathy, unspecified: Secondary | ICD-10-CM | POA: Diagnosis not present

## 2015-11-13 DIAGNOSIS — J309 Allergic rhinitis, unspecified: Secondary | ICD-10-CM | POA: Diagnosis not present

## 2015-11-13 DIAGNOSIS — M542 Cervicalgia: Secondary | ICD-10-CM | POA: Diagnosis not present

## 2015-11-13 DIAGNOSIS — E785 Hyperlipidemia, unspecified: Secondary | ICD-10-CM | POA: Diagnosis not present

## 2015-11-13 NOTE — Assessment & Plan Note (Signed)
Resolved with neck stretching/  no evidence of radiculopathy

## 2015-11-13 NOTE — Assessment & Plan Note (Signed)
- ?   R Rotator cuff problem 08/25/2011 > referred back to [name] - changed to clinoril 02/14/15 due to hbp   Adequate control on present rx, reviewed > no change in rx needed

## 2015-11-13 NOTE — Assessment & Plan Note (Addendum)
-  target LDL less than 160 > try off statins due to muscle aches March 18, 2010 > rechallenged with half dose fall 2011 and tol ok  - rechallenged with zorcor 40 mg daily 05/31/2013 > tolerated well   Lab Results  Component Value Date   CHOL 206* 08/12/2015   HDL 73.20 08/12/2015   LDLCALC 119* 08/12/2015   LDLDIRECT 147.8 07/04/2013   TRIG 69.0 08/12/2015   CHOLHDL 3 08/12/2015    Lab Results  Component Value Date   ALT 15 08/12/2015   AST 17 08/12/2015   ALKPHOS 64 08/12/2015   BILITOT 0.5 08/12/2015      Adequate control on present rx, reviewed > no change in rx needed    I had an extended discussion with the patient reviewing all relevant studies completed to date and  lasting 15 to 20 minutes of a 25 minute visit    Each maintenance medication was reviewed in detail including most importantly the difference between maintenance and prns and under what circumstances the prns are to be triggered using an action plan format that is not reflected in the computer generated alphabetically organized AVS.    Please see instructions for details which were reviewed in writing and the patient given a copy highlighting the part that I personally wrote and discussed at today's ov.

## 2015-11-13 NOTE — Patient Instructions (Addendum)
No change in medications  Try clariton instead of zyrtec and let Dr Shaune Leeks know if not improving   Please schedule a follow up visit in 3 months but call sooner if needed for CPX fasting on return

## 2015-11-13 NOTE — Progress Notes (Signed)
Subjective:    Patient ID: [name], female   DOB: 80/04/1931    MRN: [id number]   Brief patient profile:  82  ybfybf remote minimal smoking hx with seasonal rhiinits flares better overall since started on immunotherapy May2008  Followed in pulmonary clinic with hyperlipidemia/djd     History of Present Illness  03/21/2012 f/u ov/Viggo Perko for annual f/u of multiple chronic issues (hyperlipidemia, obesity, rhinitis, djd) cc L hip pain x 6 months, indolent onset, worse when walks but only every week or two and relieved by aleve quickly- rare muscle cramps also. Labs ok x vit D low > rec vit d load and increase maint to 800 units daily   08/02/2012 f/u ov/Bud Kaeser cc Pt c/o elevated BP readings for the past month. Denies any HA/vision changes. No sob, one anaprox, no more sudafed per [facility] on clariton    06/13/2014 f/u ov/Lisle Skillman re: new paresthesias/ no med calendar Chief Complaint  Patient presents with  . Follow-up    Pt c/o tingling pain in her fingers, feet and legs for the past month.      Indolent onset x 2-3 months sym paresthesias feet, ankles, lower ext. Also both hands tingly, feels like her CTS previously but = bilaterally No h/o dm or etohism rec Neuro eval > ? B12 def > rec replacement         08/12/2015  f/u ov/Demaria Deeney re: f/u lipids/ b12 def  New problem neck pain  Chief Complaint  Patient presents with  . Follow-up    Pt fasting for labs. She c/o left side neck pain- radiates to shoulder and arm for the past 2 wks.   says after started B12 no more leg problems, now abrupt onset Nov 26th woke up with L neck pain rad to elbow, some better with sulindac and heating pad and no numbness or weakness in L hand  rec Do your neck stretching 4  Sets  daily doing each set twice, use heat and sulindac as you are and see Dr [name] if not better in 2 weeks > RESOLVED      11/13/2015  f/u ov/Laurella Tull re: chronic rhinitis/ hyperlipidemia/ djd on prn clinoril (avg qod)  Chief Complaint   Patient presents with  . Follow-up    Pt states doing well overall. She has some congestion, PND, and sinus pressure.   on zyrtec and allergy shots per [facility] with lots of nasal symptoms as above x sev weeks but has not called [facility]  No obvious day to day or daytime variability or assoc chronic cough or sob or cp or chest tightness, subjective wheeze or overt   hb symptoms. No unusual exp hx or h/o childhood pna/ asthma or knowledge of premature birth.  Sleeping ok without nocturnal  or early am exacerbation  of respiratory  c/o's or need for noct saba. Also denies any obvious fluctuation of symptoms with weather or environmental changes or other aggravating or alleviating factors except as outlined above   Current Medications, Allergies, Complete Past Medical History, Past Surgical History, Family History, and Social History were reviewed in [employer] record.  ROS  The following are not active complaints unless bolded sore throat, dysphagia, dental problems, itching, sneezing,  nasal congestion or excess/ purulent secretions, ear ache,   fever, chills, sweats, unintended wt loss, classically pleuritic or exertional cp, hemoptysis,  orthopnea pnd or leg swelling, presyncope, palpitations, abdominal pain, anorexia, nausea, vomiting, diarrhea  or change in bowel or bladder habits, change  in stools or urine, dysuria,hematuria,  rash, arthralgias, visual complaints, headache, numbness, weakness or ataxia or problems with walking or coordination,  change in mood/affect or memory.                    Past Medical History:  ARTHRITIS (ICD-716.90)  Right clavicular head swelling .......................Marland KitchenDr Elwin Sleight Hand Pain.................................................Marland KitchenSypher RHINITIS, CHRONIC (ICD-472.0).................. [facility]   - on immunotherapy since 01/2007  HYPERLIPIDEMIA (ICD-272.4)  -target LDL less than 160 > try off due to musle aches March 18, 2010 > rechallenged with half dose fall 2011 and tol ok  MORBID OBESITY (ICD-278.01)  - ideal 142 Target wt = 158 for BMI < 30  COLON POLYPS  - colonoscopy 06/24/2006 with no adenomatous change, hyperplasia only  OSTEOPENIA  -BMD 08/07/08 tspine -1.2, Left femur -.4, right femur +.2  - Repeat 01/02/2010 1.9 0.1 0.5  Health Maintenance.......................................................Marland KitchenWert  - CPX 03/27/2013  - Td 09/2003 > 03/27/2013  - Pneumovax 2000 age 37  And prevnar 13 02/27/15  - GYN [name] ([facility])     Family History:  Family History Colon Cancer in mother > neg colon @ f/u 2013 > no further f/u per Dr Olevia Perches  she is an only child and knows nothing about her father's health    Social History:  Patient states former smoker quit age 53  Negative history of passive tobacco smoke exposure.  Exercise-2 times weekly  Caffeine-2 cups daily  Married with 4 children  Risk Factors:  Smoking Status: quit Age 82      Objective:   Physical Exam  wt 172 August 16, 2008 > 173 December 13, 2008 > 166 02/11/2011 >  03/21/2012  169 > 08/02/2012  165> 162 01/11/2013 >164 6/18/ 14 > 03/27/2013  163  > 05/31/2013 165> 06/11/14 163>  08/15/14  165 > 02/14/2015  162 > 02/27/2015   162 > 05/28/15 161> 08/12/2015   160 > 11/13/2015  159  Ambulatory no acute distress, vital signs reviewed     Skin: no rash or excoriation  HEENT full: dentures, nasal mucosa clear with moderate non-specific turbinate edeam,  no polyps. Nl external ear canals  Neck without JVD/Nodes/TM  Lungs clear  To a and p  RRR no s3 or murmur or increase in P2. tr edema bilateral, mild venous insufficiency changes,   neg homans sign, no calf pain/tenderness Abd soft and benign  Ext warm without calf tenderness, cyanosis clubbing  Neuro :  Nl gait, neg rhomberg , decreased distal reflexes  MS no deformities noted       Assessment:

## 2015-11-13 NOTE — Assessment & Plan Note (Signed)
Allergy immunotherapy per Dr Annamaria Boots since 01/2007 > changed to [facility] 2014  Not Adequate control on present rx, reviewed > needs to contact Dr Shaune Leeks

## 2015-11-20 ENCOUNTER — Ambulatory Visit (INDEPENDENT_AMBULATORY_CARE_PROVIDER_SITE_OTHER): Payer: Medicare Other

## 2015-11-20 DIAGNOSIS — J309 Allergic rhinitis, unspecified: Secondary | ICD-10-CM | POA: Diagnosis not present

## 2015-11-27 ENCOUNTER — Ambulatory Visit (INDEPENDENT_AMBULATORY_CARE_PROVIDER_SITE_OTHER): Payer: Medicare Other

## 2015-11-27 DIAGNOSIS — J309 Allergic rhinitis, unspecified: Secondary | ICD-10-CM

## 2015-12-11 ENCOUNTER — Ambulatory Visit (INDEPENDENT_AMBULATORY_CARE_PROVIDER_SITE_OTHER): Payer: Medicare Other

## 2015-12-11 DIAGNOSIS — J309 Allergic rhinitis, unspecified: Secondary | ICD-10-CM | POA: Diagnosis not present

## 2015-12-25 ENCOUNTER — Ambulatory Visit (INDEPENDENT_AMBULATORY_CARE_PROVIDER_SITE_OTHER): Payer: Medicare Other | Admitting: *Deleted

## 2015-12-25 DIAGNOSIS — J309 Allergic rhinitis, unspecified: Secondary | ICD-10-CM

## 2015-12-30 ENCOUNTER — Other Ambulatory Visit: Payer: Self-pay | Admitting: *Deleted

## 2015-12-30 MED ORDER — EPINEPHRINE 0.3 MG/0.3ML IJ SOAJ: 0.3000 mg | Freq: Once | INTRAMUSCULAR | Status: DC

## 2016-01-13 ENCOUNTER — Other Ambulatory Visit: Payer: Self-pay | Admitting: Allergy

## 2016-01-13 MED ORDER — EPINEPHRINE 0.3 MG/0.3ML IJ SOAJ: 0.3000 mg | Freq: Once | INTRAMUSCULAR | Status: DC

## 2016-01-22 ENCOUNTER — Ambulatory Visit (INDEPENDENT_AMBULATORY_CARE_PROVIDER_SITE_OTHER): Payer: Medicare Other | Admitting: *Deleted

## 2016-01-22 DIAGNOSIS — J309 Allergic rhinitis, unspecified: Secondary | ICD-10-CM

## 2016-02-19 ENCOUNTER — Ambulatory Visit (INDEPENDENT_AMBULATORY_CARE_PROVIDER_SITE_OTHER): Payer: Medicare Other | Admitting: *Deleted

## 2016-02-19 ENCOUNTER — Encounter: Payer: Medicare Other | Admitting: Internal Medicine

## 2016-02-19 DIAGNOSIS — J309 Allergic rhinitis, unspecified: Secondary | ICD-10-CM | POA: Diagnosis not present

## 2016-02-27 DIAGNOSIS — J301 Allergic rhinitis due to pollen: Secondary | ICD-10-CM | POA: Diagnosis not present

## 2016-03-01 DIAGNOSIS — J3089 Other allergic rhinitis: Secondary | ICD-10-CM | POA: Diagnosis not present

## 2016-03-02 ENCOUNTER — Other Ambulatory Visit (INDEPENDENT_AMBULATORY_CARE_PROVIDER_SITE_OTHER): Payer: Medicare Other

## 2016-03-02 ENCOUNTER — Encounter: Payer: Self-pay | Admitting: Internal Medicine

## 2016-03-02 ENCOUNTER — Ambulatory Visit (INDEPENDENT_AMBULATORY_CARE_PROVIDER_SITE_OTHER): Payer: Medicare Other | Admitting: Internal Medicine

## 2016-03-02 VITALS — BP 128/80 | HR 67 | Ht 61.75 in | Wt 159.0 lb

## 2016-03-02 DIAGNOSIS — E785 Hyperlipidemia, unspecified: Secondary | ICD-10-CM

## 2016-03-02 DIAGNOSIS — M129 Arthropathy, unspecified: Secondary | ICD-10-CM

## 2016-03-02 DIAGNOSIS — J3089 Other allergic rhinitis: Secondary | ICD-10-CM | POA: Diagnosis not present

## 2016-03-02 LAB — HEPATIC FUNCTION PANEL
ALT: 16 U/L (ref 0–35)
AST: 16 U/L (ref 0–37)
Albumin: 4 g/dL (ref 3.5–5.2)
Alkaline Phosphatase: 63 U/L (ref 39–117)
Bilirubin, Direct: 0.1 mg/dL (ref 0.0–0.3)
Total Bilirubin: 0.6 mg/dL (ref 0.2–1.2)
Total Protein: 6.9 g/dL (ref 6.0–8.3)

## 2016-03-02 LAB — BASIC METABOLIC PANEL
BUN: 22 mg/dL (ref 6–23)
CO2: 28 mEq/L (ref 19–32)
Calcium: 9.8 mg/dL (ref 8.4–10.5)
Chloride: 108 mEq/L (ref 96–112)
Creatinine, Ser: 0.9 mg/dL (ref 0.40–1.20)
GFR: 76.86 mL/min (ref >60.00)
Glucose, Bld: 93 mg/dL (ref 70–99)
Potassium: 3.9 mEq/L (ref 3.5–5.1)
Sodium: 140 mEq/L (ref 135–145)

## 2016-03-02 LAB — LIPID PANEL
Cholesterol: 233 mg/dL — ABNORMAL HIGH (ref 0–200)
HDL: 77.1 mg/dL (ref >39.00)
LDL Cholesterol: 143 mg/dL — ABNORMAL HIGH (ref 0–99)
NonHDL: 156.14
Total CHOL/HDL Ratio: 3
Triglycerides: 66 mg/dL (ref 0.0–149.0)
VLDL: 13.2 mg/dL (ref 0.0–40.0)

## 2016-03-02 LAB — CBC WITH DIFFERENTIAL/PLATELET
Basophils Absolute: 0 10*3/uL (ref 0.0–0.1)
Basophils Relative: 0.9 % (ref 0.0–3.0)
Eosinophils Absolute: 0.2 10*3/uL (ref 0.0–0.7)
Eosinophils Relative: 3.7 % (ref 0.0–5.0)
HCT: 37.8 % (ref 36.0–46.0)
Hemoglobin: 12.5 g/dL (ref 12.0–15.0)
Lymphocytes Relative: 34.5 % (ref 12.0–46.0)
Lymphs Abs: 1.9 10*3/uL (ref 0.7–4.0)
MCHC: 33.1 g/dL (ref 30.0–36.0)
MCV: 95 fl (ref 78.0–100.0)
Monocytes Absolute: 0.4 10*3/uL (ref 0.1–1.0)
Monocytes Relative: 6.6 % (ref 3.0–12.0)
Neutro Abs: 2.9 10*3/uL (ref 1.4–7.7)
Neutrophils Relative %: 54.3 % (ref 43.0–77.0)
Platelets: 239 10*3/uL (ref 150.0–400.0)
RBC: 3.98 Mil/uL (ref 3.87–5.11)
RDW: 14.1 % (ref 11.5–15.5)
WBC: 5.4 10*3/uL (ref 4.0–10.5)

## 2016-03-02 LAB — TSH: TSH: 1.9 u[IU]/mL (ref 0.35–4.50)

## 2016-03-02 MED ORDER — PREDNISONE 10 MG PO TABS: ORAL_TABLET | ORAL | Status: DC

## 2016-03-02 NOTE — Assessment & Plan Note (Signed)
- ?   R Rotator cuff problem 08/25/2011 > referred back to [name] -  changed to clinoril 02/14/15 due to hbp   Reviewed use of clinoril prn but if not effective ok to use short course prednisone, refer back to ortho

## 2016-03-02 NOTE — Progress Notes (Signed)
Subjective:    Patient ID: [name], female   DOB: 1931-03-10    MRN: [id number]   Brief patient profile:  21  yobf remote minimal smoking hx with seasonal rhiinits flares better overall since started on immunotherapy May2008  Followed in pulmonary clinic with hyperlipidemia/djd     History of Present Illness  03/21/2012 f/u ov/Chandi Nicklin for annual f/u of multiple chronic issues (hyperlipidemia, obesity, rhinitis, djd) cc L hip pain x 6 months, indolent onset, worse when walks but only every week or two and relieved by aleve quickly- rare muscle cramps also. Labs ok x vit D low > rec vit d load and increase maint to 800 units daily   08/02/2012 f/u ov/Rekha Hobbins cc Pt c/o elevated BP readings for the past month. Denies any HA/vision changes. No sob, one anaprox, no more sudafed per [facility] on clariton    06/13/2014 f/u ov/Marlyn Tondreau re: new paresthesias/ no med calendar Chief Complaint  Patient presents with  . Follow-up    Pt c/o tingling pain in her fingers, feet and legs for the past month.      Indolent onset x 2-3 months sym paresthesias feet, ankles, lower ext. Also both hands tingly, feels like her CTS previously but = bilaterally No h/o dm or etohism rec Neuro eval > ? B12 def > rec replacement         08/12/2015  f/u ov/Keisuke Hollabaugh re: f/u lipids/ b12 def  New problem neck pain  Chief Complaint  Patient presents with  . Follow-up    Pt fasting for labs. She c/o left side neck pain- radiates to shoulder and arm for the past 2 wks.   says after started B12 no more leg problems, now abrupt onset Nov 26th woke up with L neck pain rad to elbow, some better with sulindac and heating pad and no numbness or weakness in L hand  rec Do your neck stretching 4  Sets  daily doing each set twice, use heat and sulindac as you are and see Dr [name] if not better in 2 weeks > RESOLVED      11/13/2015  f/u ov/Sherriann Szuch re: chronic rhinitis/ hyperlipidemia/ djd on prn clinoril (avg qod)  Chief Complaint   Patient presents with  . Follow-up    Pt states doing well overall. She has some congestion, PND, and sinus pressure.   on zyrtec and allergy shots per [facility] with lots of nasal symptoms as above x sev weeks but has not called [facility] rec  No change in medications Try clariton instead of zyrtec and let Dr Shaune Leeks know if not improving     03/02/2016  f/u ov/Kellyanne Ellwanger re: yearly eval/ hyperlipidemia/ djd / allergic rhinitis  Chief Complaint  Patient presents with  . Annual Exam    Pt is fasting. She c/o right knee pain "constantly"- requesting pred taper to help with this before she goes on vacation.   no tia/ claudication  Rhinitis is better this time of year, no sob  No obvious day to day or daytime variability or assoc excess/ purulent sputum or mucus plugs or hemoptysis or cp or chest tightness, subjective wheeze or overt sinus or hb symptoms. No unusual exp hx or h/o childhood pna/ asthma or knowledge of premature birth.  Sleeping ok without nocturnal  or early am exacerbation  of respiratory  c/o's or need for noct saba. Also denies any obvious fluctuation of symptoms with weather or environmental changes or other aggravating or alleviating factors except as outlined above  Current Medications, Allergies, Complete Past Medical History, Past Surgical History, Family History, and Social History were reviewed in [employer] record.  ROS  The following are not active complaints unless bolded sore throat, dysphagia, dental problems, itching, sneezing,  nasal congestion or excess/ purulent secretions, ear ache,   fever, chills, sweats, unintended wt loss, classically pleuritic or exertional cp,  orthopnea pnd or leg swelling, presyncope, palpitations, abdominal pain, anorexia, nausea, vomiting, diarrhea  or change in bowel or bladder habits, change in stools or urine, dysuria,hematuria,  rash, arthralgias esp knees , visual complaints, headache, numbness, weakness or  ataxia or problems with walking or coordination,  change in mood/affect or memory.                  Past Medical History:  ARTHRITIS (ICD-716.90)  Right clavicular head swelling .......................Marland KitchenDr Elwin Sleight Hand Pain.................................................Marland Kitchen   Kuzma RHINITIS, CHRONIC (ICD-472.0).................. [facility] / [facility] - on immunotherapy since 01/2007  HYPERLIPIDEMIA (ICD-272.4)  -target LDL less than 160 > try off due to musle aches March 18, 2010 > rechallenged with half dose fall 2011 and tol ok  MORBID OBESITY (ICD-278.01)  - ideal 142 Target wt = 158 for BMI < 30  COLON POLYPS  - colonoscopy 06/24/2006 with no adenomatous change, hyperplasia only  OSTEOPENIA  -BMD 08/07/08 tspine -1.2, Left femur -.4, right femur +.2  - Repeat 01/02/2010 1.9 0.1 0.5  Health Maintenance.......................................................Marland KitchenWert  - CPX 03/02/2016   - Td 09/2003 > 03/27/2013  - Pneumovax 2000 age 37  And prevnar 13 02/27/15  - GYN [name] ([facility])     Family History:  Family History Colon Cancer in mother > neg colon @ f/u 2013 > no further f/u per Dr Olevia Perches  she is an only child and knows nothing about her father's health    Social History:  Patient states former smoker quit age 43  Negative history of passive tobacco smoke exposure.  Exercise-2 times weekly  Caffeine-2 cups daily  Married with 4 children  Risk Factors:  Smoking Status: quit Age 53      Objective:   Physical Exam  wt 172 August 16, 2008 > 173 December 13, 2008 > 166 02/11/2011 >  03/21/2012  169 > 08/02/2012  165> 162 01/11/2013 >164 6/18/ 14 > 03/27/2013  163  > 05/31/2013 165> 06/11/14 163>  08/15/14  165 > 02/14/2015  162 > 02/27/2015   162 > 05/28/15 161> 08/12/2015   160 > 11/13/2015  159 >  03/02/2016   159   Ambulatory no acute distress, vital signs reviewed     Skin: no rash or excoriation  HEENT full: dentures, nasal mucosa clear with moderate non-specific turbinate  edeam,  no polyps. Nl external ear canals  Neck without JVD/Nodes/TM  Lungs clear  To a and p  RRR no s3 or murmur or increase in P2. tr edema bilateral, mild venous insufficiency changes,   neg homans sign, no calf pain/tenderness Abd soft and benign  Ext warm without calf tenderness, cyanosis clubbing  Neuro :  Nl gait, neg rhomberg , decreased distal reflexes  MS no deformities noted    Labs 03/02/2016   Lipids, tsh, lfts, bmet cbc   ekg 03/02/2016   Nsr/ min LA abnormality/ no ischemic or hypertrophic changes      Assessment:

## 2016-03-02 NOTE — Patient Instructions (Addendum)
Remember clinoril is 200 mg up to twice daily with meals as needed for arthritis pain and if it fails then ok to use Prednisone 10 mg take  4 each am x 2 days,   2 each am x 2 days,  1 each am x 2 days and stop.  Please remember to go to the lab department downstairs for your tests - we will call you with the results when they are available.

## 2016-03-02 NOTE — Assessment & Plan Note (Signed)
Adequate control on present rx, reviewed > no change in rx needed   I had an extended discussion with the patient reviewing all relevant studies completed to date and  lasting 15 to 20 minutes of a 25 minute visit    Each maintenance medication was reviewed in detail including most importantly the difference between maintenance and prns and under what circumstances the prns are to be triggered using an action plan format that is not reflected in the computer generated alphabetically organized AVS.    Please see instructions for details which were reviewed in writing and the patient given a copy highlighting the part that I personally wrote and discussed at today's ov.

## 2016-03-02 NOTE — Assessment & Plan Note (Addendum)
-  target LDL less than 160 > try off statins due to muscle aches March 18, 2010 > rechallenged with half dose fall 2011 and tol ok  - rechallenged with zocor 40 mg daily 05/31/2013 > tolerated well   Lipid profile/ tsh/ lfts ordered

## 2016-03-03 NOTE — Progress Notes (Signed)
Quick Note:  Spoke with pt and notified of results per Dr. Wert. Pt verbalized understanding and denied any questions.  ______ 

## 2016-03-18 ENCOUNTER — Ambulatory Visit (INDEPENDENT_AMBULATORY_CARE_PROVIDER_SITE_OTHER): Payer: Medicare Other | Admitting: *Deleted

## 2016-03-18 DIAGNOSIS — J309 Allergic rhinitis, unspecified: Secondary | ICD-10-CM

## 2016-04-15 ENCOUNTER — Ambulatory Visit (INDEPENDENT_AMBULATORY_CARE_PROVIDER_SITE_OTHER): Payer: Medicare Other

## 2016-04-15 DIAGNOSIS — J309 Allergic rhinitis, unspecified: Secondary | ICD-10-CM

## 2016-05-27 ENCOUNTER — Ambulatory Visit (INDEPENDENT_AMBULATORY_CARE_PROVIDER_SITE_OTHER): Payer: Medicare Other

## 2016-05-27 DIAGNOSIS — J309 Allergic rhinitis, unspecified: Secondary | ICD-10-CM

## 2016-06-10 ENCOUNTER — Ambulatory Visit (INDEPENDENT_AMBULATORY_CARE_PROVIDER_SITE_OTHER): Payer: Medicare Other

## 2016-06-10 DIAGNOSIS — Z23 Encounter for immunization: Secondary | ICD-10-CM

## 2016-06-21 ENCOUNTER — Ambulatory Visit: Payer: Medicare Other | Admitting: Pediatrics

## 2016-06-22 ENCOUNTER — Telehealth: Payer: Self-pay | Admitting: Internal Medicine

## 2016-06-22 NOTE — Telephone Encounter (Signed)
Dr [name] please advise if the patient needs to be seen this December or next June 2018 Pt last seen 03/02/16 and there was no follow up mentioned. Pt states that her and her husband were seen at the same time for their physicals and he was scheduled a 3 month ROV but she was not. Please advise if she needs to follow up asap or if she is tp schedule 6 month or 12 month ROV. Thanks.

## 2016-06-22 NOTE — Telephone Encounter (Signed)
Called and spoke with pt and she is aware of appt with MW scheduled on 12/10 at 1030.  Nothing further is needed.

## 2016-06-22 NOTE — Telephone Encounter (Signed)
6 m f/u from her June ov so need to see her in Dec

## 2016-06-23 ENCOUNTER — Ambulatory Visit (INDEPENDENT_AMBULATORY_CARE_PROVIDER_SITE_OTHER): Payer: Medicare Other

## 2016-06-23 DIAGNOSIS — J309 Allergic rhinitis, unspecified: Secondary | ICD-10-CM

## 2016-06-28 ENCOUNTER — Ambulatory Visit (INDEPENDENT_AMBULATORY_CARE_PROVIDER_SITE_OTHER): Payer: Medicare Other | Admitting: Internal Medicine

## 2016-06-28 ENCOUNTER — Encounter: Payer: Self-pay | Admitting: Internal Medicine

## 2016-06-28 VITALS — BP 124/78 | HR 74 | Temp 98.4°F

## 2016-06-28 DIAGNOSIS — J069 Acute upper respiratory infection, unspecified: Secondary | ICD-10-CM

## 2016-06-28 DIAGNOSIS — R058 Other specified cough: Secondary | ICD-10-CM

## 2016-06-28 DIAGNOSIS — R05 Cough: Secondary | ICD-10-CM | POA: Diagnosis not present

## 2016-06-28 NOTE — Patient Instructions (Addendum)
Prednisone 10 mg take  4 each am x 2 days,   2 each am x 2 days,  1 each am x 2 days and stop   Change back to zyrtec 10 mg daily as needed  Whenever coughing add pepcid 20 mg after bfast and supper   GERD (REFLUX)  is an extremely common cause of respiratory symptoms just like yours , many times with no obvious heartburn at all.    It can be treated with medication, but also with lifestyle changes including elevation of the head of your bed (ideally with 6 inch  bed blocks),  Smoking cessation, avoidance of late meals, excessive alcohol, and avoid fatty foods, chocolate, peppermint, colas, red wine, and acidic juices such as orange juice.  NO MINT OR MENTHOL PRODUCTS SO NO COUGH DROPS  USE SUGARLESS CANDY INSTEAD (Jolley ranchers or Stover's or Life Savers) or even ice chips will also do - the key is to swallow to prevent all throat clearing. NO OIL BASED VITAMINS - use powdered substitutes.    Keep follow up appt - call sooner if needed

## 2016-06-28 NOTE — Progress Notes (Signed)
Subjective:    Patient ID: [name], female   DOB: September 29, 1930    MRN: [id number]   Brief patient profile:  14  yobf remote minimal smoking hx with seasonal rhiinits flares better overall since started on immunotherapy May2008  Followed in pulmonary clinic with hyperlipidemia/djd     History of Present Illness  03/21/2012 f/u ov/[name] for annual f/u of multiple chronic issues (hyperlipidemia, obesity, rhinitis, djd) cc L hip pain x 6 months, indolent onset, worse when walks but only every week or two and relieved by aleve quickly- rare muscle cramps also. Labs ok x vit D low > rec vit d load and increase maint to 800 units daily   08/02/2012 f/u ov/[name] cc Pt c/o elevated BP readings for the past month. Denies any HA/vision changes. No sob, one anaprox, no more sudafed per [facility] on clariton    06/13/2014 f/u ov/[name] re: new paresthesias/ no med calendar Chief Complaint  Patient presents with  . Follow-up    Pt c/o tingling pain in her fingers, feet and legs for the past month.      Indolent onset x 2-3 months sym paresthesias feet, ankles, lower ext. Also both hands tingly, feels like her CTS previously but = bilaterally No h/o dm or etohism rec Neuro eval > ? B12 def > rec replacement         08/12/2015  f/u ov/[name] re: f/u lipids/ b12 def  New problem neck pain  Chief Complaint  Patient presents with  . Follow-up    Pt fasting for labs. She c/o left side neck pain- radiates to shoulder and arm for the past 2 wks.   says after started B12 no more leg problems, now abrupt onset Nov 26th woke up with L neck pain rad to elbow, some better with sulindac and heating pad and no numbness or weakness in L hand  rec Do your neck stretching 4  Sets  daily doing each set twice, use heat and sulindac as you are and see Dr [name] if not better in 2 weeks > RESOLVED      11/13/2015  f/u ov/[name] re: chronic rhinitis/ hyperlipidemia/ djd on prn clinoril (avg qod)  Chief Complaint   Patient presents with  . Follow-up    Pt states doing well overall. She has some congestion, PND, and sinus pressure.   on zyrtec and allergy shots per [facility] with lots of nasal symptoms as above x sev weeks but has not called [facility] rec  No change in medications Try clariton instead of zyrtec and let Dr Shaune Leeks know if not improving     03/02/2016  f/u ov/[name] re: yearly eval/ hyperlipidemia/ djd / allergic rhinitis  Chief Complaint  Patient presents with  . Annual Exam    Pt is fasting. She c/o right knee pain "constantly"- requesting pred taper to help with this before she goes on vacation.   no tia/ claudication Rhinitis is better this time of year, no sob   06/28/2016 acute extended ov/[name] re:  Cough flare ? [place] / ? Sinus dz   sick x one week p husband caught cough/ nasal pressure/ clear mucus Feels zyrtec works better, gets shots from [facility] but no change in rx recently   No obvious day to day or daytime variability or assoc excess/ purulent sputum or mucus plugs or hemoptysis or cp or chest tightness, subjective wheeze or overt   hb symptoms. No unusual exp hx or h/o childhood pna/ asthma or knowledge of  premature birth.  Sleeping ok without nocturnal  or early am exacerbation  of respiratory  c/o's or need for noct saba. Also denies any obvious fluctuation of symptoms with weather or environmental changes or other aggravating or alleviating factors except as outlined above   Current Medications, Allergies, Complete Past Medical History, Past Surgical History, Family History, and Social History were reviewed in [employer] record.  ROS  The following are not active complaints unless bolded sore throat, dysphagia, dental problems, itching, sneezing,  nasal congestion or excess/ purulent secretions, ear ache,   fever, chills, sweats, unintended wt loss, classically pleuritic or exertional cp,  orthopnea pnd or leg swelling, presyncope, palpitations,  abdominal pain, anorexia, nausea, vomiting, diarrhea  or change in bowel or bladder habits, change in stools or urine, dysuria,hematuria,  rash, arthralgias esp knees , visual complaints, headache, numbness, weakness or ataxia or problems with walking or coordination,  change in mood/affect or memory.                  Past Medical History:  ARTHRITIS (ICD-716.90)  Right clavicular head swelling .......................Marland KitchenDr Elwin Sleight Hand Pain.................................................Marland Kitchen   Kuzma RHINITIS, CHRONIC (ICD-472.0).................. [facility] / [facility] - on immunotherapy since 01/2007  HYPERLIPIDEMIA (ICD-272.4)  -target LDL less than 160 > try off due to musle aches March 18, 2010 > rechallenged with half dose fall 2011 and tol ok  MORBID OBESITY (ICD-278.01)  - ideal 142 Target wt = 158 for BMI < 30  COLON POLYPS  - colonoscopy 06/24/2006 with no adenomatous change, hyperplasia only  OSTEOPENIA  -BMD 08/07/08 tspine -1.2, Left femur -.4, right femur +.2  - Repeat 01/02/2010 1.9 0.1 0.5  Health Maintenance.......................................................Marland KitchenWert  - CPX 03/02/2016   - Td 09/2003 > 03/27/2013  - Pneumovax 2000 age 59  And prevnar 13 02/27/15  - GYN [name] ([facility])     Family History:  Family History Colon Cancer in mother > neg colon @ f/u 2013 > no further f/u per Dr Olevia Perches  she is an only child and knows nothing about her father's health    Social History:  Patient states former smoker quit age 27  Negative history of passive tobacco smoke exposure.  Exercise-2 times weekly  Caffeine-2 cups daily  Married with 4 children  Risk Factors:  Smoking Status: quit Age 67      Objective:   Physical Exam  wt 172 August 16, 2008 > 173 December 13, 2008 > 166 02/11/2011 >  03/21/2012  169 > 08/02/2012  165> 162 01/11/2013 >164 6/18/ 14 > 03/27/2013  163  > 05/31/2013 165> 06/11/14 163>  08/15/14  165 > 02/14/2015  162 > 02/27/2015   162 > 05/28/15 161>  08/12/2015   160 > 11/13/2015  159 >  03/02/2016   159    Ambulatory no acute distress, vital signs reviewed     Skin: no rash or excoriation  HEENT full: dentures, nasal mucosa clear with moderate non-specific turbinate edeam,  no polyps. Nl external ear canals  Neck without JVD/Nodes/TM  Lungs  With mid exp wheezes bilaterally / some pseudowheeze RRR no s3 or murmur or increase in P2. tr edema bilateral, mild venous insufficiency changes,   neg homans sign, no calf pain/tenderness Abd soft and benign  Ext warm without calf tenderness, cyanosis clubbing  Neuro :  Nl gait, neg rhomberg , decreased distal reflexes  MS no deformities noted         Assessment:

## 2016-06-29 ENCOUNTER — Encounter: Payer: Self-pay | Admitting: Internal Medicine

## 2016-06-29 DIAGNOSIS — R05 Cough: Secondary | ICD-10-CM | POA: Insufficient documentation

## 2016-06-29 DIAGNOSIS — R058 Other specified cough: Secondary | ICD-10-CM | POA: Insufficient documentation

## 2016-06-29 NOTE — Assessment & Plan Note (Signed)
In pt with underlying allergic rhinitis > rx zpak/ pred x 6

## 2016-06-29 NOTE — Assessment & Plan Note (Signed)
Of the three most common causes of chronic cough, only one (GERD)  can actually cause the other two (asthma and post nasal drip syndrome)  and perpetuate the cylce of cough inducing airway trauma, inflammation, heightened sensitivity to reflux which is prompted by the cough itself via a cyclical mechanism.    This may partially respond to steroids and look like asthma and post nasal drainage but never erradicated completely unless the cough and the secondary reflux are eliminated, preferably both at the same time.  While not intuitively obvious, many patients with chronic low grade reflux do not cough until there is a secondary insult that disturbs the protective epithelial barrier and exposes sensitive nerve endings.  This can be viral (as well may prove to be the case here) or direct physical injury such as with an endotracheal tube.   The point is that once this occurs, it is difficult to eliminate using anything but a maximally effective acid suppression regimen at least in the short run, accompanied by an appropriate diet to address non acid GERD.   Reviewed approp rx for gerd   I had an extended discussion with the patient reviewing all relevant studies completed to date and  lasting 15 to 20 minutes of a 25 minute visit    Each maintenance medication was reviewed in detail including most importantly the difference between maintenance and prns and under what circumstances the prns are to be triggered using an action plan format that is not reflected in the computer generated alphabetically organized AVS.    Please see instructions for details which were reviewed in writing and the patient given a copy highlighting the part that I personally wrote and discussed at today's ov.

## 2016-07-01 ENCOUNTER — Other Ambulatory Visit: Payer: Self-pay | Admitting: Internal Medicine

## 2016-07-01 DIAGNOSIS — M129 Arthropathy, unspecified: Secondary | ICD-10-CM

## 2016-07-13 ENCOUNTER — Encounter: Payer: Self-pay | Admitting: Pediatrics

## 2016-07-13 ENCOUNTER — Ambulatory Visit (INDEPENDENT_AMBULATORY_CARE_PROVIDER_SITE_OTHER): Payer: Medicare Other | Admitting: Pediatrics

## 2016-07-13 ENCOUNTER — Ambulatory Visit: Payer: Self-pay

## 2016-07-13 VITALS — BP 130/76 | HR 64 | Temp 98.0°F | Resp 24 | Ht 62.01 in | Wt 160.7 lb

## 2016-07-13 DIAGNOSIS — J309 Allergic rhinitis, unspecified: Secondary | ICD-10-CM

## 2016-07-13 DIAGNOSIS — J3089 Other allergic rhinitis: Secondary | ICD-10-CM

## 2016-07-13 DIAGNOSIS — H1045 Other chronic allergic conjunctivitis: Secondary | ICD-10-CM | POA: Diagnosis not present

## 2016-07-13 DIAGNOSIS — H1010 Acute atopic conjunctivitis, unspecified eye: Secondary | ICD-10-CM

## 2016-07-13 MED ORDER — EPINEPHRINE 0.3 MG/0.3ML IJ SOAJ: INTRAMUSCULAR | 1 refills | Status: DC

## 2016-07-13 MED ORDER — FLUTICASONE PROPIONATE 50 MCG/ACT NA SUSP: 1.0000 | Freq: Two times a day (BID) | NASAL | 5 refills | Status: DC | PRN

## 2016-07-13 NOTE — Progress Notes (Signed)
  [place] [address] Dept: [phone]  FOLLOW UP NOTE  Patient ID: [name], female    DOB: [birth year]  Age: 82 y.o. MRN: [id number] Date of Office Visit: 07/13/2016  Assessment  Chief Complaint: Allergic Rhinitis  (doing well)  HPI [name] presents for follow-up of allergic rhinitis. She is on allergy injections every 3 weeks. One vial contains pollens and the other vial contains dust mites, cat and cockroach. Her allergic rhinitis is much improved on the allergy injections.  Current medications are Zyrtec 10 mg once a day if needed for runny nose. Her other medications are outlined in the chart.   Drug Allergies:  No Known Allergies  Physical Exam: BP 130/76 (BP Location: Left Arm, Patient Position: Sitting, Cuff Size: Normal)   Pulse 64   Temp 98 F (36.7 C) (Oral)   Resp (!) 24   Ht 5' 2.01" (1.575 m)   Wt 160 lb 11.5 oz (72.9 kg)   BMI 29.39 kg/m    Physical Exam  Constitutional: She is oriented to person, place, and time. She appears well-developed and well-nourished.  HENT:  Eyes normal. Ears normal. Nose normal. Pharynx normal.  Neck: Neck supple.  Cardiovascular:  S1 and S2 normal no murmurs  Pulmonary/Chest:  Clear to percussion and auscultation  Lymphadenopathy:    She has no cervical adenopathy.  Neurological: She is alert and oriented to person, place, and time.  Psychiatric: She has a normal mood and affect. Her behavior is normal. Judgment and thought content normal.  Vitals reviewed.   Diagnostics:  none  Assessment and Plan: 1. Other allergic rhinitis   2. Seasonal allergic conjunctivitis     Meds ordered this encounter  Medications  . EPINEPHrine (EPIPEN 2-PAK) 0.3 mg/0.3 mL IJ SOAJ injection    Sig: Use as directed for severe allergic reaction.    Dispense:  2 Device    Refill:  1    Dispense mylan generic brand only.  . fluticasone (FLONASE) 50 MCG/ACT nasal spray    Sig: Place 1 spray into both  nostrils 2 (two) times daily as needed for allergies or rhinitis.    Dispense:  16 g    Refill:  5    Patient Instructions  Zyrtec 10 mg once a day if needed for runny nose Fluticasone 1 spray per nostril twice a day if needed for stuffy nose Allergy injections every 4 weeks Call me if you are not doing well on this treatment plan Continue on your other medications   Return in about 1 year (around 07/13/2017).    Thank you for the opportunity to care for this patient.  Please do not hesitate to contact me with questions.  Penne Lash, M.D.  [facility] [address] [place], [place] [address] [phone]

## 2016-07-13 NOTE — Patient Instructions (Addendum)
Zyrtec 10 mg once a day if needed for runny nose Fluticasone 1 spray per nostril twice a day if needed for stuffy nose Allergy injections every 4 weeks Call me if you are not doing well on this treatment plan Continue on your other medications

## 2016-07-22 ENCOUNTER — Other Ambulatory Visit: Payer: Self-pay | Admitting: Internal Medicine

## 2016-07-22 DIAGNOSIS — M129 Arthropathy, unspecified: Secondary | ICD-10-CM

## 2016-08-10 ENCOUNTER — Other Ambulatory Visit: Payer: Self-pay | Admitting: Internal Medicine

## 2016-08-10 MED ORDER — SULINDAC 200 MG PO TABS: 200.0000 mg | ORAL_TABLET | Freq: Two times a day (BID) | ORAL | 3 refills | Status: DC

## 2016-08-10 MED ORDER — SIMVASTATIN 40 MG PO TABS: 40.0000 mg | ORAL_TABLET | Freq: Every day | ORAL | 3 refills | Status: DC

## 2016-08-17 ENCOUNTER — Ambulatory Visit (INDEPENDENT_AMBULATORY_CARE_PROVIDER_SITE_OTHER): Payer: Medicare Other | Admitting: Internal Medicine

## 2016-08-17 ENCOUNTER — Encounter: Payer: Self-pay | Admitting: Internal Medicine

## 2016-08-17 ENCOUNTER — Other Ambulatory Visit (INDEPENDENT_AMBULATORY_CARE_PROVIDER_SITE_OTHER): Payer: Medicare Other

## 2016-08-17 VITALS — BP 134/84 | HR 77 | Ht 62.0 in | Wt 160.0 lb

## 2016-08-17 DIAGNOSIS — E7800 Pure hypercholesterolemia, unspecified: Secondary | ICD-10-CM

## 2016-08-17 DIAGNOSIS — M129 Arthropathy, unspecified: Secondary | ICD-10-CM | POA: Diagnosis not present

## 2016-08-17 LAB — LIPID PANEL
Cholesterol: 222 mg/dL — ABNORMAL HIGH (ref 0–200)
HDL: 80.3 mg/dL (ref >39.00)
LDL Cholesterol: 128 mg/dL — ABNORMAL HIGH (ref 0–99)
NonHDL: 141.47
Total CHOL/HDL Ratio: 3
Triglycerides: 67 mg/dL (ref 0.0–149.0)
VLDL: 13.4 mg/dL (ref 0.0–40.0)

## 2016-08-17 LAB — HEPATIC FUNCTION PANEL
ALT: 15 U/L (ref 0–35)
AST: 16 U/L (ref 0–37)
Albumin: 4.3 g/dL (ref 3.5–5.2)
Alkaline Phosphatase: 61 U/L (ref 39–117)
Bilirubin, Direct: 0.2 mg/dL (ref 0.0–0.3)
Total Bilirubin: 0.8 mg/dL (ref 0.2–1.2)
Total Protein: 7 g/dL (ref 6.0–8.3)

## 2016-08-17 NOTE — Patient Instructions (Addendum)
Please remember to go to the lab   department downstairs for your tests - we will call you with the results when they are available.  cpx due 03/06/16

## 2016-08-17 NOTE — Progress Notes (Signed)
Spoke with pt and notified of results per Dr. Wert. Pt verbalized understanding and denied any questions. 

## 2016-08-17 NOTE — Progress Notes (Signed)
Subjective:    Patient ID: [name], female   DOB: [birth year]    MRN: [id number]   Brief patient profile:  82  yobf remote minimal smoking hx with seasonal rhiinits flares better overall since started on immunotherapy May2008  Followed in pulmonary clinic with hyperlipidemia/djd     History of Present Illness  03/21/2012 f/u ov/Ellice Boultinghouse for annual f/u of multiple chronic issues (hyperlipidemia, obesity, rhinitis, djd) cc L hip pain x 6 months, indolent onset, worse when walks but only every week or two and relieved by aleve quickly- rare muscle cramps also. Labs ok x vit D low > rec vit d load and increase maint to 800 units daily   08/02/2012 f/u ov/Michaelle Bottomley cc Pt c/o elevated BP readings for the past month. Denies any HA/vision changes. No sob, one anaprox, no more sudafed per [facility] on clariton    06/13/2014 f/u ov/Ahmiyah Coil re: new paresthesias/ no med calendar Chief Complaint  Patient presents with  . Follow-up    Pt c/o tingling pain in her fingers, feet and legs for the past month.      Indolent onset x 2-3 months sym paresthesias feet, ankles, lower ext. Also both hands tingly, feels like her CTS previously but = bilaterally No h/o dm or etohism rec Neuro eval > ? B12 def > rec replacement         08/12/2015  f/u ov/Edwinna Rochette re: f/u lipids/ b12 def  New problem neck pain  Chief Complaint  Patient presents with  . Follow-up    Pt fasting for labs. She c/o left side neck pain- radiates to shoulder and arm for the past 2 wks.   says after started B12 no more leg problems, now abrupt onset Nov 26th woke up with L neck pain rad to elbow, some better with sulindac and heating pad and no numbness or weakness in L hand  rec Do your neck stretching 4  Sets  daily doing each set twice, use heat and sulindac as you are and see Dr [name] if not better in 2 weeks > RESOLVED        08/17/2016  f/u ov/Platon Arocho re: hyperlipidemia /djd Chief Complaint  Patient presents with  . Follow-up   Doing well and no new co's.    no ex cp/tia/claudication on zocor 40 mg daily and no muscle aches Rare need for clinoril      No obvious day to day or daytime variability or assoc excess/ purulent sputum or mucus plugs or hemoptysis or cp or chest tightness, subjective wheeze or overt   hb symptoms. No unusual exp hx or h/o childhood pna/ asthma or knowledge of premature birth.  Sleeping ok without nocturnal  or early am exacerbation  of respiratory  c/o's or need for noct saba. Also denies any obvious fluctuation of symptoms with weather or environmental changes or other aggravating or alleviating factors except as outlined above   Current Medications, Allergies, Complete Past Medical History, Past Surgical History, Family History, and Social History were reviewed in [employer] record.  ROS  The following are not active complaints unless bolded sore throat, dysphagia, dental problems, itching, sneezing,  nasal congestion or excess/ purulent secretions, ear ache,   fever, chills, sweats, unintended wt loss, classically pleuritic or exertional cp,  orthopnea pnd or leg swelling, presyncope, palpitations, abdominal pain, anorexia, nausea, vomiting, diarrhea  or change in bowel or bladder habits, change in stools or urine, dysuria,hematuria,  rash, arthralgias esp knees , visual complaints,  headache, numbness, weakness or ataxia or problems with walking or coordination,  change in mood/affect or memory.                  Past Medical History:  ARTHRITIS (ICD-716.90)  Right clavicular head swelling .......................Marland KitchenDr Elwin Sleight Hand Pain.................................................Marland Kitchen   Kuzma RHINITIS, CHRONIC (ICD-472.0).................. [facility] / [facility] - on immunotherapy since 01/2007  HYPERLIPIDEMIA (ICD-272.4)  -target LDL less than 160 > try off due to musle aches March 18, 2010 > rechallenged with half dose fall 2011 and tol ok  MORBID OBESITY  (ICD-278.01)  - ideal 142 Target wt = 158 for BMI < 30  COLON POLYPS  - colonoscopy 06/24/2006 with no adenomatous change, hyperplasia only  OSTEOPENIA  -BMD 08/07/08 tspine -1.2, Left femur -.4, right femur +.2  - Repeat 01/02/2010 1.9 0.1 0.5  Health Maintenance.......................................................Marland KitchenWert  - CPX 03/02/2016   - Td 09/2003 > 03/27/2013  - Pneumovax 2000 age 82  And prevnar 13 02/27/15  - GYN [name] ([facility] [place])     Family History:  Family History Colon Cancer in mother > neg colon @ f/u 2013 > no further f/u per Dr Olevia Perches  she is an only child and knows nothing about her father's health    Social History:  Patient states former smoker quit age 82  Negative history of passive tobacco smoke exposure.  Exercise-2 times weekly  Caffeine-2 cups daily  Married with 4 children  Risk Factors:  Smoking Status: quit Age 32      Objective:   Physical Exam  wt 172 August 16, 2008 > 173 December 13, 2008 > 166 02/11/2011 >  03/21/2012  169 > 08/02/2012  165> 162 01/11/2013 >164 6/18/ 14 > 03/27/2013  163  > 05/31/2013 165> 06/11/14 163>  08/15/14  165 > 02/14/2015  162 > 02/27/2015   162 > 05/28/15 161> 08/12/2015   160 > 11/13/2015  159 >  03/02/2016   159  > 08/17/2016  160   Ambulatory no acute distress, vital signs reviewed     Skin: no rash or excoriation  HEENT full: dentures, nasal mucosa clear with moderate non-specific turbinate edeam,  no polyps. Nl external ear canals  Neck without JVD/Nodes/TM  Lungs  Clear to A and P  RRR no s3 or murmur or increase in P2. tr edema bilateral, mild venous insufficiency changes,   neg homans sign, no calf pain/tenderness Abd soft and benign  Ext warm without calf tenderness, cyanosis clubbing  Neuro :  Alert/ nl sensorium, no motor def   MS nl gain, no deformities noted   Labs ordered 08/17/2016  Lipid profile / lfts     Assessment:

## 2016-08-19 NOTE — Assessment & Plan Note (Signed)
-   changed to clinoril 02/14/15 due to hbp   Good control and bp ok on prn clinoril which spares renal protaglandins

## 2016-08-19 NOTE — Assessment & Plan Note (Signed)
-  target LDL less than 160 > try off statins due to muscle aches March 18, 2010 > rechallenged with half dose fall 2011 and tol ok  - rechallenged with zocor 40 mg daily 05/31/2013 > tolerated well   Lab Results  Component Value Date   CHOL 222 (H) 08/17/2016   HDL 80.30 08/17/2016   LDLCALC 128 (H) 08/17/2016   LDLDIRECT 147.8 07/04/2013   TRIG 67.0 08/17/2016   CHOLHDL 3 08/17/2016    Reasonable   ldl/ excellent hdl and lfts ok so Adequate control on present rx, reviewed in detail with pt > no change in rx needed

## 2016-10-29 DIAGNOSIS — M5416 Radiculopathy, lumbar region: Secondary | ICD-10-CM | POA: Diagnosis not present

## 2016-10-29 DIAGNOSIS — M545 Low back pain: Secondary | ICD-10-CM | POA: Diagnosis not present

## 2016-10-29 DIAGNOSIS — M1711 Unilateral primary osteoarthritis, right knee: Secondary | ICD-10-CM | POA: Diagnosis not present

## 2016-10-29 DIAGNOSIS — M25561 Pain in right knee: Secondary | ICD-10-CM | POA: Diagnosis not present

## 2016-11-08 NOTE — Addendum Note (Signed)
Addended by: Felipa Emory on: 11/08/2016 11:16 AM   Modules accepted: Orders

## 2016-11-18 ENCOUNTER — Encounter: Payer: Self-pay | Admitting: *Deleted

## 2017-02-07 ENCOUNTER — Telehealth: Payer: Self-pay | Admitting: Internal Medicine

## 2017-02-07 DIAGNOSIS — Z0000 Encounter for general adult medical examination without abnormal findings: Secondary | ICD-10-CM

## 2017-02-07 NOTE — Telephone Encounter (Signed)
Ok to schedule same place she had them last time so they can compare to prior

## 2017-02-07 NOTE — Telephone Encounter (Signed)
Spoke with the pt  She states that she received letter stating that it was time for her mammo  She states the letter stated that at her age it could be optional from now on  She states last year, she had to have 2 done  We do not have these results scanned in however  Pt asking what MW would rec  She feels like she should have one done  Please advise, thanks!

## 2017-02-07 NOTE — Telephone Encounter (Signed)
Spoke with the pt and notified of recs per MW  She verbalized understanding  Order sent to [facility] 

## 2017-06-14 ENCOUNTER — Ambulatory Visit (INDEPENDENT_AMBULATORY_CARE_PROVIDER_SITE_OTHER): Payer: Medicare Other | Admitting: Internal Medicine

## 2017-06-14 ENCOUNTER — Other Ambulatory Visit (INDEPENDENT_AMBULATORY_CARE_PROVIDER_SITE_OTHER): Payer: Medicare Other

## 2017-06-14 ENCOUNTER — Encounter: Payer: Self-pay | Admitting: Internal Medicine

## 2017-06-14 ENCOUNTER — Ambulatory Visit (INDEPENDENT_AMBULATORY_CARE_PROVIDER_SITE_OTHER)
Admission: RE | Admit: 2017-06-14 | Discharge: 2017-06-14 | Disposition: A | Discharge status: Home / Self Care | Payer: Medicare Other | Source: Ambulatory Visit | Attending: Internal Medicine | Admitting: Internal Medicine

## 2017-06-14 VITALS — BP 130/90 | HR 72 | Ht 62.0 in | Wt 155.4 lb

## 2017-06-14 DIAGNOSIS — R05 Cough: Secondary | ICD-10-CM

## 2017-06-14 DIAGNOSIS — Z23 Encounter for immunization: Secondary | ICD-10-CM

## 2017-06-14 DIAGNOSIS — R058 Other specified cough: Secondary | ICD-10-CM

## 2017-06-14 DIAGNOSIS — E7800 Pure hypercholesterolemia, unspecified: Secondary | ICD-10-CM

## 2017-06-14 DIAGNOSIS — R0789 Other chest pain: Secondary | ICD-10-CM

## 2017-06-14 DIAGNOSIS — I10 Essential (primary) hypertension: Secondary | ICD-10-CM | POA: Diagnosis not present

## 2017-06-14 LAB — LIPID PANEL
Cholesterol: 282 mg/dL — ABNORMAL HIGH (ref 0–200)
HDL: 81.2 mg/dL (ref >39.00)
LDL Cholesterol: 185 mg/dL — ABNORMAL HIGH (ref 0–99)
NonHDL: 200.75
Total CHOL/HDL Ratio: 3
Triglycerides: 81 mg/dL (ref 0.0–149.0)
VLDL: 16.2 mg/dL (ref 0.0–40.0)

## 2017-06-14 LAB — HEPATIC FUNCTION PANEL
ALT: 14 U/L (ref 0–35)
AST: 14 U/L (ref 0–37)
Albumin: 4.2 g/dL (ref 3.5–5.2)
Alkaline Phosphatase: 59 U/L (ref 39–117)
Bilirubin, Direct: 0.1 mg/dL (ref 0.0–0.3)
Total Bilirubin: 0.7 mg/dL (ref 0.2–1.2)
Total Protein: 7 g/dL (ref 6.0–8.3)

## 2017-06-14 LAB — CBC WITH DIFFERENTIAL/PLATELET
Basophils Absolute: 0.1 10*3/uL (ref 0.0–0.1)
Basophils Relative: 1.1 % (ref 0.0–3.0)
Eosinophils Absolute: 0.2 10*3/uL (ref 0.0–0.7)
Eosinophils Relative: 4.4 % (ref 0.0–5.0)
HCT: 39.1 % (ref 36.0–46.0)
Hemoglobin: 13 g/dL (ref 12.0–15.0)
Lymphocytes Relative: 41.1 % (ref 12.0–46.0)
Lymphs Abs: 2 10*3/uL (ref 0.7–4.0)
MCHC: 33.1 g/dL (ref 30.0–36.0)
MCV: 96.6 fl (ref 78.0–100.0)
Monocytes Absolute: 0.5 10*3/uL (ref 0.1–1.0)
Monocytes Relative: 9.5 % (ref 3.0–12.0)
Neutro Abs: 2.1 10*3/uL (ref 1.4–7.7)
Neutrophils Relative %: 43.9 % (ref 43.0–77.0)
Platelets: 222 10*3/uL (ref 150.0–400.0)
RBC: 4.05 Mil/uL (ref 3.87–5.11)
RDW: 13.8 % (ref 11.5–15.5)
WBC: 4.9 10*3/uL (ref 4.0–10.5)

## 2017-06-14 LAB — SEDIMENTATION RATE: Sed Rate: 28 mm/hr (ref 0–30)

## 2017-06-14 LAB — BASIC METABOLIC PANEL
BUN: 20 mg/dL (ref 6–23)
CO2: 27 mEq/L (ref 19–32)
Calcium: 9.8 mg/dL (ref 8.4–10.5)
Chloride: 107 mEq/L (ref 96–112)
Creatinine, Ser: 0.78 mg/dL (ref 0.40–1.20)
GFR: 90.38 mL/min (ref >60.00)
Glucose, Bld: 98 mg/dL (ref 70–99)
Potassium: 4 mEq/L (ref 3.5–5.1)
Sodium: 141 mEq/L (ref 135–145)

## 2017-06-14 LAB — TSH: TSH: 1.79 u[IU]/mL (ref 0.35–4.50)

## 2017-06-14 MED ORDER — HYDROCODONE-ACETAMINOPHEN 5-325 MG PO TABS: 1.0000 | ORAL_TABLET | ORAL | 0 refills | Status: DC | PRN

## 2017-06-14 MED FILL — HYDROCODON-APAP 5-325: 5-325 | 7 days supply | Qty: 30 | Fill #0

## 2017-06-14 NOTE — Patient Instructions (Signed)
Ok to take tylenol arthritis during the day as needed but at bedtime take norco one every 4 hours as needed for pain until you see Dr [name]   Please remember to go to the lab and x-ray department downstairs in the basement  for your tests - we will call you with the results when they are available.     Please schedule a follow up visit in 6  months but call sooner if needed

## 2017-06-14 NOTE — Progress Notes (Signed)
Subjective:    Patient ID: [name], female   DOB: [birth year]    MRN: [id number]   Brief patient profile:  21  yobf remote minimal smoking hx with seasonal rhiinits flares better overall since started on immunotherapy May2008  Followed in pulmonary clinic with hyperlipidemia/djd     History of Present Illness    06/13/2014 f/u ov/Dhara Schepp re: new paresthesias/ no med calendar Chief Complaint  Patient presents with  . Follow-up    Pt c/o tingling pain in her fingers, feet and legs for the past month.      Indolent onset x 2-3 months sym paresthesias feet, ankles, lower ext. Also both hands tingly, feels like her CTS previously but = bilaterally No h/o dm or etohism rec Neuro eval > ? B12 def > rec replacement    06/14/2017 acute extended ov/Tayden Nichelson re: new R shoulder pain and wants annual labs done for hbp/ hyperlipidemia  Chief Complaint  Patient presents with  . Acute Visit    Pt c/o right shouler and CP for the past 3 wks- she has been using heat, sulindac and analgesic rub with minimal relief.   woke up one morning with R post shoulder pain aggravated esp by abducting  R Arm and continues with exquisitely positional shoulder pain every since with pain radiating from inferior tip of scapula to top of shoulder near [facility] joint  No pain with breathing   or cough or exertion / no fever or h/o trauma or prev R shoulder pain   No assoc n or v or diaph  Clinoril / heat help some as does massage but still having trouble getting comfortable esp hs    No obvious day to day or daytime variability or assoc excess/ purulent sputum or mucus plugs or hemoptysis or cp or chest tightness, subjective wheeze or overt sinus or hb symptoms. No unusual exp hx or h/o childhood pna/ asthma or knowledge of premature birth.  Sleeping ok flat without nocturnal  or early am exacerbation  of respiratory  c/o's or need for noct saba. Also denies any obvious fluctuation of symptoms with weather or environmental  changes or other aggravating or alleviating factors except as outlined above   Current Allergies, Complete Past Medical History, Past Surgical History, Family History, and Social History were reviewed in [employer] record.  ROS  The following are not active complaints unless bolded Hoarseness, sore throat, dysphagia, dental problems, itching, sneezing,  nasal congestion or discharge of excess mucus or purulent secretions, ear ache,   fever, chills, sweats, unintended wt loss or wt gain, classically pleuritic or exertional cp,  orthopnea pnd or leg swelling, presyncope, palpitations, abdominal pain, anorexia, nausea, vomiting, diarrhea  or change in bowel habits or change in bladder habits, change in stools or change in urine, dysuria, hematuria,  rash, arthralgias, visual complaints, headache, numbness, weakness or ataxia or problems with walking or coordination,  change in mood/affect or memory.        Current Meds  Medication Sig  . cetirizine (ZYRTEC) 10 MG tablet Take 10 mg by mouth at bedtime as needed (drippy nose, drainage, sneezing).   . EPINEPHrine (EPIPEN 2-PAK) 0.3 mg/0.3 mL IJ SOAJ injection Use as directed for severe allergic reaction.  . fluticasone (FLONASE) 50 MCG/ACT nasal spray Place 1 spray into both nostrils 2 (two) times daily as needed for allergies or rhinitis.  . hydrocortisone 2.5 % cream Apply 1 application topically 2 (two) times daily.  . simvastatin (ZOCOR) 40  MG tablet Take 1 tablet (40 mg total) by mouth daily.  . Sodium Chloride-Sodium Bicarb (AYR SALINE NASAL RINSE NA) Rinses as needed for nasal congestion  . sulindac (CLINORIL) 200 MG tablet Take 1 tablet (200 mg total) by mouth 2 (two) times daily.  . Vitamin D, Cholecalciferol, 400 units TABS Take by mouth.                                  Past Medical History:  ARTHRITIS (ICD-716.90)  Right clavicular head swelling chronic/ new R shoulder pain 06/14/2017   .......................Marland KitchenDr Elwin Sleight Hand Pain.................................................Marland Kitchen   Kuzma RHINITIS, CHRONIC (ICD-472.0).................. [facility] - on immunotherapy since 01/2007  HYPERLIPIDEMIA (ICD-272.4)  -target LDL less than 160 > try off due to musle aches March 18, 2010 > rechallenged with half dose fall 2011 and tol ok  MORBID OBESITY (ICD-278.01)  - ideal 142 Target wt = 158 for BMI < 30  COLON POLYPS  - colonoscopy 06/24/2006 with no adenomatous change, hyperplasia only  OSTEOPENIA  -BMD 08/07/08 tspine -1.2, Left femur -.4, right femur +.2  - Repeat 01/02/2010 1.9 0.1 0.5  Health Maintenance.......................................................Marland KitchenWert  - CPX labs 06/14/2017   - Td 09/2003 > 03/27/2013  - Pneumovax 2000 age 45  And prevnar 13 02/27/15  - GYN [name] ([facility])     Family History:  Family History Colon Cancer in mother > neg colon @ f/u 2013 > no further f/u per Dr Olevia Perches  she is an only child and knows nothing about her father's health      Social History:  Patient states former smoker quit age 77  Negative history of passive tobacco smoke exposure.  Exercise-2 times weekly  Caffeine-2 cups daily  Married with 4 children  Risk Factors:  Smoking Status: quit Age 24      Objective:   Physical Exam  wt 172 August 16, 2008 > 173 December 13, 2008 > 166 02/11/2011 >  03/21/2012  169 > 08/02/2012  165> 162 01/11/2013 >164 6/18/ 14 > 03/27/2013  163  > 05/31/2013 165> 06/11/14 163>  08/15/14  165 > 02/14/2015  162 > 02/27/2015   162 > 05/28/15 161> 08/12/2015   160 > 11/13/2015  159 >  03/02/2016   159  > 08/17/2016  160 > 06/14/2017    155   Ambulatory no acute distress, vital signs reviewed   - Note on arrival 02 sats  98% on RA  And bp 130/90   Skin: no rash or excoriation / shingles  HEENT full: dentures, nasal mucosa clear with moderate non-specific turbinate edeam,  no polyps. Nl external ear canals  Neck without JVD/Nodes/TM  Lungs   Clear to A and P - no rub/ min tenderness to palpation along med margin of R shoulder  RRR no s3 or murmur or increase in P2. tr edema bilateral, mild venous insufficiency changes,   neg homans sign, no calf pain/tenderness Abd soft and benign  Ext warm without calf tenderness, cyanosis clubbing  Neuro :  Alert/ nl sensorium, no motor def   MS nl gait - pain elicited on abduction of R arm or flex/ extention / nl pulses R radial    CXR PA and Lateral:   06/14/2017 :    I personally reviewed images and   impression as follows:  wnl        ekg 06/14/2017   NSR /  wnl    Labs ordered/ reviewed:      Chemistry      Component Value Date/Time   NA 141 06/14/2017 1133   K 4.0 06/14/2017 1133   CL 107 06/14/2017 1133   CO2 27 06/14/2017 1133   BUN 20 06/14/2017 1133   CREATININE 0.78 06/14/2017 1133   CREATININE 0.82 07/16/2014 0947      Component Value Date/Time   CALCIUM 9.8 06/14/2017 1133   ALKPHOS 59 06/14/2017 1133   AST 14 06/14/2017 1133   ALT 14 06/14/2017 1133   BILITOT 0.7 06/14/2017 1133        Lab Results  Component Value Date   WBC 4.9 06/14/2017   HGB 13.0 06/14/2017   HCT 39.1 06/14/2017   MCV 96.6 06/14/2017   PLT 222.0 06/14/2017        Lab Results  Component Value Date   TSH 1.79 06/14/2017          Lab Results  Component Value Date   ESRSEDRATE 28 06/14/2017   ESRSEDRATE 11 07/16/2014        Assessment:

## 2017-06-15 NOTE — Assessment & Plan Note (Signed)
Classic mscp related to motion R shoulder and not responding to clinoril so ok to try heat/ massage and refer back to ortho

## 2017-06-15 NOTE — Assessment & Plan Note (Signed)
Adequate control on present rx, reviewed in detail with pt > no change in rx needed  =  flonase maint/ prn zyrtec

## 2017-06-15 NOTE — Assessment & Plan Note (Signed)
-  target LDL less than 160 > try off statins due to muscle aches March 18, 2010 > rechallenged with half dose fall 2011 and tol ok  - rechallenged with zocor 40 mg daily 05/31/2013 > tolerated well   Lab Results  Component Value Date   CHOL 282 (H) 06/14/2017   HDL 81.20 06/14/2017   LDLCALC 185 (H) 06/14/2017   LDLDIRECT 147.8 07/04/2013   TRIG 81.0 06/14/2017   CHOLHDL 3 06/14/2017     ldl up but so is HDL so no change rx/ consider adding more fish to diet or fish oil if not coughing

## 2017-06-15 NOTE — Assessment & Plan Note (Addendum)
Marginally Adequate control on present rx, reviewed in detail with pt > no change in rx needed just avoid salt/ keep up the good work with wt loss  Lab Results  Component Value Date   CREATININE 0.78 06/14/2017   CREATININE 0.90 03/02/2016   CREATININE 0.85 02/14/2015   CREATININE 0.82 07/16/2014     I had an extended discussion with the patient reviewing all relevant studies completed to date and  lasting 15 to 20 minutes of a 25 minute acute office visit  Also addressing chronic dz   Each maintenance medication was reviewed in detail including most importantly the difference between maintenance and prns and under what circumstances the prns are to be triggered using an action plan format that is not reflected in the computer generated alphabetically organized AVS.    Please see AVS for specific instructions unique to this visit that I personally wrote and verbalized to the the pt in detail and then reviewed with pt  by my nurse highlighting any  changes in therapy recommended at today's visit to their plan of care.

## 2017-09-21 ENCOUNTER — Telehealth: Payer: Self-pay | Admitting: Internal Medicine

## 2017-09-21 NOTE — Telephone Encounter (Signed)
Called and spoke with pt letting her know we had received a call from her insurance seeing if we could do an annual wellness checkup. Told pt we saw she was due for one due to having the last one done in 2017.  Pt expressed understanding. Scheduled pt for a physical with MW 10/06/17 at 10:45. Nothing further needed at this current time.

## 2017-10-06 ENCOUNTER — Other Ambulatory Visit (INDEPENDENT_AMBULATORY_CARE_PROVIDER_SITE_OTHER): Payer: Medicare Other

## 2017-10-06 ENCOUNTER — Ambulatory Visit (INDEPENDENT_AMBULATORY_CARE_PROVIDER_SITE_OTHER): Payer: Medicare Other | Admitting: Internal Medicine

## 2017-10-06 ENCOUNTER — Encounter: Payer: Self-pay | Admitting: Internal Medicine

## 2017-10-06 ENCOUNTER — Encounter: Payer: Medicare Other | Admitting: Internal Medicine

## 2017-10-06 VITALS — BP 124/74 | HR 64 | Ht 61.5 in | Wt 155.0 lb

## 2017-10-06 DIAGNOSIS — R05 Cough: Secondary | ICD-10-CM

## 2017-10-06 DIAGNOSIS — E7800 Pure hypercholesterolemia, unspecified: Secondary | ICD-10-CM

## 2017-10-06 DIAGNOSIS — R058 Other specified cough: Secondary | ICD-10-CM

## 2017-10-06 LAB — LIPID PANEL
Cholesterol: 230 mg/dL — ABNORMAL HIGH (ref 0–200)
HDL: 74.7 mg/dL (ref >39.00)
LDL Cholesterol: 139 mg/dL — ABNORMAL HIGH (ref 0–99)
NonHDL: 155
Total CHOL/HDL Ratio: 3
Triglycerides: 80 mg/dL (ref 0.0–149.0)
VLDL: 16 mg/dL (ref 0.0–40.0)

## 2017-10-06 NOTE — Patient Instructions (Signed)
Take flonase consistently and supplement with the zyrtec up to 10 mg daily > if not effective return to Dr Shaune Leeks    Please remember to go to the lab department downstairs in the basement  for your tests - we will call you with the results when they are available.   Change your next appt to  follow up visit in 6  months but call sooner if needed

## 2017-10-06 NOTE — Progress Notes (Signed)
LMTCB

## 2017-10-06 NOTE — Progress Notes (Addendum)
Subjective:    Patient ID: [name], female   DOB: 01-06-1931    MRN: [id number]   Brief patient profile:  63  yobf remote minimal smoking hx with seasonal rhiinits flares better overall since started on immunotherapy May2008  Followed in pulmonary clinic with hyperlipidemia/djd     History of Present Illness    06/13/2014 f/u ov/[name] re: new paresthesias/ no med calendar Chief Complaint  Patient presents with  . Follow-up    Pt c/o tingling pain in her fingers, feet and legs for the past month.      Indolent onset x 2-3 months sym paresthesias feet, ankles, lower ext. Also both hands tingly, feels like her CTS previously but = bilaterally No h/o dm or etohism rec Neuro eval > ? B12 def > rec replacement    06/14/2017 acute extended ov/[name] re: new R shoulder pain and wants annual labs done for hbp/ hyperlipidemia  Chief Complaint  Patient presents with  . Acute Visit    Pt c/o right shouler and CP for the past 3 wks- she has been using heat, sulindac and analgesic rub with minimal relief.   woke up one morning with R post shoulder pain aggravated esp by abducting  R Arm and continues with exquisitely positional shoulder pain every since with pain radiating from inferior tip of scapula to top of shoulder near [facility] joint  No pain with breathing   or cough or exertion / no fever or h/o trauma or prev R shoulder pain  rec Ok to take tylenol arthritis during the day as needed but at bedtime take norco one every 4 hours as needed for pain until you see Dr [name]  Please remember to go to the lab and x-ray department downstairs in the basement  for your tests - we will call you with the results when they are available.    10/06/2017  f/u ov/[name] re: hyperlipidemia/ chronic rhinitis no long seeing allergy/bardelas Chief Complaint  Patient presents with  . Annual Exam    Pt is fasting. She is c/o nasal congestion and sneezing.   worse nasal symptoms x 3-4 weeks just using flonase /  not zyrtec  No reg ex but Not limited by breathing from desired activities    No obvious day to day or daytime variability or assoc excess/ purulent sputum or mucus plugs or hemoptysis or cp or chest tightness, subjective wheeze or overt sinus or hb symptoms. No unusual exposure hx or h/o childhood pna/ asthma or knowledge of premature birth.  Sleeping ok flat without nocturnal  or early am exacerbation  of respiratory  c/o's or need for noct saba. Also denies any obvious fluctuation of symptoms with weather or environmental changes or other aggravating or alleviating factors except as outlined above   Current Allergies, Complete Past Medical History, Past Surgical History, Family History, and Social History were reviewed in [employer] record.  ROS  The following are not active complaints unless bolded Hoarseness, sore throat, dysphagia, dental problems, itching, sneezing,  nasal congestion or discharge of excess mucus or purulent secretions, ear ache,   fever, chills, sweats, unintended wt loss or wt gain, classically pleuritic or exertional cp,  orthopnea pnd or leg swelling, presyncope, palpitations, abdominal pain, anorexia, nausea, vomiting, diarrhea  or change in bowel habits or change in bladder habits, change in stools or change in urine, dysuria, hematuria,  rash, arthralgias better , visual complaints, headache, numbness, weakness or ataxia or problems with walking or  coordination,  change in mood/affect or memory.        Current Meds  Medication Sig  . cetirizine (ZYRTEC) 10 MG tablet Take 10 mg by mouth at bedtime as needed (drippy nose, drainage, sneezing).   . fluticasone (FLONASE) 50 MCG/ACT nasal spray Place 1 spray into both nostrils 2 (two) times daily as needed for allergies or rhinitis.  . hydrocortisone 2.5 % cream Apply 1 application topically 2 (two) times daily.  . simvastatin (ZOCOR) 40 MG tablet Take 1 tablet (40 mg total) by mouth daily.  . Sodium  Chloride-Sodium Bicarb (AYR SALINE NASAL RINSE NA) Rinses as needed for nasal congestion  . sulindac (CLINORIL) 200 MG tablet Take 1 tablet (200 mg total) by mouth 2 (two) times daily.  . Vitamin D, Cholecalciferol, 400 units TABS Take by mouth.                      Past Medical History:  ARTHRITIS (ICD-716.90)  Right clavicular head swelling chronic/ new R shoulder pain 06/14/2017  .......................Marland KitchenDr Elwin Sleight Hand Pain.................................................Marland Kitchen   Kuzma RHINITIS, CHRONIC (ICD-472.0).................. [place] [facility] / [facility] - on immunotherapy since 01/2007  HYPERLIPIDEMIA (ICD-272.4)  -target LDL less than 160 > try off due to musle aches March 18, 2010 > rechallenged with half dose fall 2011 and tol ok  MORBID OBESITY (ICD-278.01)  - ideal 142 Target wt = 158 for BMI < 30  COLON POLYPS  - colonoscopy 06/24/2006 with no adenomatous change, hyperplasia only  OSTEOPENIA  -BMD 08/07/08 tspine -1.2, Left femur -.4, right femur +.2  - Repeat 01/02/2010 1.9 0.1 0.5  Health Maintenance.......................................................Marland KitchenWert  - CPX labs 06/14/2017   - Td 09/2003 > 03/27/2013  - Pneumovax 2000 age 24  And prevnar 13 02/27/15  - GYN [name] ([place])      Family History:  Family History Colon Cancer in mother > neg colon @ f/u 2013 > no further f/u per Dr Olevia Perches  she is an only child and knows nothing about her father's health      Social History:  Patient states former smoker quit age 15  Negative history of passive tobacco smoke exposure.  Exercise-2 times weekly  Caffeine-2 cups daily  Married with 4 children  Risk Factors:  Smoking Status: quit Age 19      Objective:   Physical Exam  wt 172 August 16, 2008 > 173 December 13, 2008 > 166 02/11/2011 >  03/21/2012  169 > 08/02/2012  165> 162 01/11/2013 >164 6/18/ 14 > 03/27/2013  163  > 05/31/2013 165> 06/11/14 163>  08/15/14  165 > 02/14/2015  162 > 02/27/2015   162 > 05/28/15 161>  08/12/2015   160 > 11/13/2015  159 >  03/02/2016   159  > 08/17/2016  160 > 06/14/2017    155 > 10/06/2017  155     amb bf nad  Vital signs reviewed - Note on arrival 02 sats  99% on RA       HEENT: nl dentition, turbinates bilaterally, and oropharynx. Nl external ear canals without cough reflex   NECK :  without JVD/Nodes/TM/ nl carotid upstrokes bilaterally   LUNGS: no acc muscle use,  Nl contour chest which is clear to A and P bilaterally without cough on insp or exp maneuvers   CV:  RRR  no s3 or murmur or increase in P2, and no edema   ABD:  soft and nontender with nl inspiratory excursion in the supine  position. No bruits or organomegaly appreciated, bowel sounds nl  MS:  Nl gait/ ext warm without deformities, calf tenderness, cyanosis or clubbing No obvious joint restrictions   SKIN: warm and dry without lesions    NEURO:  alert, approp, nl sensorium with  no motor or cerebellar deficits apparent.            Assessment:

## 2017-10-07 ENCOUNTER — Encounter: Payer: Self-pay | Admitting: Internal Medicine

## 2017-10-07 NOTE — Assessment & Plan Note (Signed)
-  target LDL less than 160 > try off statins due to muscle aches March 18, 2010 > rechallenged with half dose fall 2011 and tol ok  - rechallenged with zocor 40 mg daily 05/31/2013 > tolerated well    Lab Results  Component Value Date   CHOL 230 (H) 10/06/2017   HDL 74.70 10/06/2017   LDLCALC 139 (H) 10/06/2017   LDLDIRECT 147.8 07/04/2013   TRIG 80.0 10/06/2017   CHOLHDL 3 10/06/2017    Adequate control on present rx, reviewed in detail with pt > no change in rx needed  > encouraged by such a high hdl   Each maintenance medication was reviewed in detail including most importantly the difference between maintenance and as needed and under what circumstances the prns are to be used.  Please see AVS for specific  Instructions which are unique to this visit and I personally typed out  which were reviewed in detail in writing with the patient and a copy provided.

## 2017-10-07 NOTE — Assessment & Plan Note (Signed)
Reminded re use of zyrtec to supplement maint flonase > rec  if not better return to allergy/ Bard[facility]as  see avs for instructions unique to this ov

## 2017-12-13 ENCOUNTER — Ambulatory Visit: Payer: Medicare Other | Admitting: Internal Medicine

## 2018-04-06 ENCOUNTER — Ambulatory Visit: Payer: Medicare Other | Admitting: Internal Medicine

## 2018-04-18 ENCOUNTER — Ambulatory Visit: Payer: Medicare Other | Admitting: Internal Medicine

## 2018-04-25 ENCOUNTER — Encounter: Payer: Self-pay | Admitting: Internal Medicine

## 2018-04-25 ENCOUNTER — Ambulatory Visit (INDEPENDENT_AMBULATORY_CARE_PROVIDER_SITE_OTHER): Payer: Medicare Other | Admitting: Internal Medicine

## 2018-04-25 VITALS — BP 118/64 | HR 67 | Ht 62.0 in | Wt 155.4 lb

## 2018-04-25 DIAGNOSIS — E782 Mixed hyperlipidemia: Secondary | ICD-10-CM

## 2018-04-25 DIAGNOSIS — J310 Chronic rhinitis: Secondary | ICD-10-CM

## 2018-04-25 DIAGNOSIS — M129 Arthropathy, unspecified: Secondary | ICD-10-CM | POA: Diagnosis not present

## 2018-04-25 NOTE — Assessment & Plan Note (Signed)
Allergy immunotherapy per Dr Annamaria Boots since 01/2007 > changed to [facility] allergy/Bardelas 2014  Adequate control on present rx, reviewed in detail with pt > no change in rx needed  = flonase/ zyrtec

## 2018-04-25 NOTE — Patient Instructions (Signed)
Please schedule a follow up visit in 3 months but call sooner if needed for CPX on return

## 2018-04-25 NOTE — Progress Notes (Signed)
Subjective:    Patient ID: [name], female   DOB: 09-21-1930    MRN: [id number]   Brief patient profile:  27  yobf remote minimal smoking hx with seasonal rhiinits flares better overall since started on immunotherapy May2008  Followed in pulmonary clinic with hyperlipidemia/djd     History of Present Illness    06/13/2014 f/u ov/[name] re: new paresthesias/ no med calendar Chief Complaint  Patient presents with  . Follow-up    Pt c/o tingling pain in her fingers, feet and legs for the past month.      Indolent onset x 2-3 months sym paresthesias feet, ankles, lower ext. Also both hands tingly, feels like her CTS previously but = bilaterally No h/o dm or etohism rec Neuro eval > ? B12 def > rec replacement    06/14/2017 acute extended ov/[name] re: new R shoulder pain and wants annual labs done for hbp/ hyperlipidemia  Chief Complaint  Patient presents with  . Acute Visit    Pt c/o right shouler and CP for the past 3 wks- she has been using heat, sulindac and analgesic rub with minimal relief.   woke up one morning with R post shoulder pain aggravated esp by abducting  R Arm and continues with exquisitely positional shoulder pain every since with pain radiating from inferior tip of scapula to top of shoulder near [facility] joint  No pain with breathing   or cough or exertion / no fever or h/o trauma or prev R shoulder pain  rec Ok to take tylenol arthritis during the day as needed but at bedtime take norco one every 4 hours as needed for pain until you see Dr [name]  Please remember to go to the lab and x-ray department downstairs in the basement  for your tests - we will call you with the results when they are available.    10/06/2017  f/u ov/[name] re: hyperlipidemia/ chronic rhinitis no long seeing allergy/bardelas Chief Complaint  Patient presents with  . Annual Exam    Pt is fasting. She is c/o nasal congestion and sneezing.   worse nasal symptoms x 3-4 weeks just using flonase /  not zyrtec  No reg ex but Not limited by breathing from desired activities  rec  Take flonase consistently and supplement with the zyrtec up to 10 mg daily > if not effective return to Dr Shaune Leeks  Please remember to go to the lab department downstairs in the basement  for your tests - we will call you with the results when they are available.    04/25/2018  f/u ov/[name] re:  Allergic rhinitis/ hyperlipidemia/ djd  Chief Complaint  Patient presents with  . Follow-up    Pt states she has been doing okay since last visit and states she has been having some problems with postnasal drainage that is worse in the evenins.  Dyspnea:  Walked all over [employer] but not doing Y any more due to husband with obs  Cough: just pnds at hs  Sleeping: fine despite pnds  SABA use: no 02: no   Arthritis symptoms better with injections/ prn clinioril  And tol zocor 40 s muscle aches   No obvious day to day or daytime variability or assoc excess/ purulent sputum or mucus plugs or hemoptysis or cp or chest tightness, subjective wheeze or overt sinus or hb symptoms.   Sleeps  without nocturnal  or early am exacerbation  of respiratory  c/o's or need for noct saba. Also denies  any obvious fluctuation of symptoms with weather or environmental changes or other aggravating or alleviating factors except as outlined above   No unusual exposure hx or h/o childhood pna/ asthma or knowledge of premature birth.  Current Allergies, Complete Past Medical History, Past Surgical History, Family History, and Social History were reviewed in [employer] record.  ROS  The following are not active complaints unless bolded Hoarseness, sore throat, dysphagia, dental problems, itching, sneezing,  nasal congestion or discharge of excess mucus or purulent secretions, ear ache,   fever, chills, sweats, unintended wt loss or wt gain, classically pleuritic or exertional cp,  orthopnea pnd or arm/hand swelling  or leg  swelling, presyncope, palpitations, abdominal pain, anorexia, nausea, vomiting, diarrhea  or change in bowel habits or change in bladder habits, change in stools or change in urine, dysuria, hematuria,  rash, arthralgias, visual complaints, headache, numbness, weakness or ataxia or problems with walking or coordination,  change in mood or  memory.        Current Meds  Medication Sig  . cetirizine (ZYRTEC) 10 MG tablet Take 10 mg by mouth at bedtime as needed (drippy nose, drainage, sneezing).   . fluticasone (FLONASE) 50 MCG/ACT nasal spray Place 1 spray into both nostrils 2 (two) times daily as needed for allergies or rhinitis.  . hydrocortisone 2.5 % cream Apply 1 application topically 2 (two) times daily.  . simvastatin (ZOCOR) 40 MG tablet Take 1 tablet (40 mg total) by mouth daily.  . Sodium Chloride-Sodium Bicarb (AYR SALINE NASAL RINSE NA) Rinses as needed for nasal congestion  . sulindac (CLINORIL) 200 MG tablet Take 1 tablet (200 mg total) by mouth 2 (two) times daily.  . Vitamin D, Cholecalciferol, 400 units TABS Take by mouth.                              Past Medical History:  ARTHRITIS (ICD-716.90)  Right clavicular head swelling chronic/ new R shoulder pain 06/14/2017  .......................Marland KitchenDr Elwin Sleight Hand Pain.................................................Marland Kitchen   Kuzma RHINITIS, CHRONIC (ICD-472.0).................. [place] [facility] / [facility] - on immunotherapy since 01/2007  HYPERLIPIDEMIA (ICD-272.4)  -target LDL less than 160 > try off due to musle aches March 18, 2010 > rechallenged with half dose fall 2011 and tol ok  MORBID OBESITY (ICD-278.01)  - ideal 142 Target wt = 158 for BMI < 30  COLON POLYPS  - colonoscopy 06/24/2006 with no adenomatous change, hyperplasia only  OSTEOPENIA  -BMD 08/07/08 tspine -1.2, Left femur -.4, right femur +.2  - Repeat 01/02/2010 1.9 0.1 0.5  Health Maintenance.......................................................Marland KitchenWert  -  CPX labs 06/14/2017   - Td 09/2003 > 03/27/2013  - Pneumovax 2000 age 49  And prevnar 13 02/27/15  - GYN [name] ([place])      Family History:  Family History Colon Cancer in mother > neg colon @ f/u 2013 > no further f/u per Dr Olevia Perches  she is an only child and knows nothing about her father's health      Social History:  Patient states former smoker quit age 87  Negative history of passive tobacco smoke exposure.  Exercise-2 times weekly  Caffeine-2 cups daily  Married with 4 children  Risk Factors:  Smoking Status: quit Age 8      Objective:   Physical Exam  wt 172 August 16, 2008 > 173 December 13, 2008 > 166 02/11/2011 >  03/21/2012  169 > 08/02/2012  165>  162 01/11/2013 >164 6/18/ 14 > 03/27/2013  163  > 05/31/2013 165> 06/11/14 163>  08/15/14  165 > 02/14/2015  162 > 02/27/2015   162 > 05/28/15 161> 08/12/2015   160 > 11/13/2015  159 >  03/02/2016   159  > 08/17/2016  160 > 06/14/2017    155 > 10/06/2017  155 > 04/25/2018   155     amb pleasant bf nad  Vital signs reviewed - Note on arrival 02 sats  99% on RA    HEENT: nl dentition, turbinates bilaterally, and oropharynx. Nl external ear canals without cough reflex   NECK :  without JVD/Nodes/TM/ nl carotid upstrokes bilaterally   LUNGS: no acc muscle use,  Nl contour chest which is clear to A and P bilaterally without cough on insp or exp maneuvers   CV:  RRR  no s3 or murmur or increase in P2, and no edema   ABD:  soft and nontender with nl inspiratory excursion in the supine position. No bruits or organomegaly appreciated, bowel sounds nl  MS:  Nl gait/ ext warm without deformities, calf tenderness, cyanosis or clubbing No obvious joint restrictions   SKIN: warm and dry without lesions    NEURO:  alert, approp, nl sensorium with  no motor or cerebellar deficits apparent.                 Assessment:

## 2018-04-25 NOTE — Assessment & Plan Note (Addendum)
Adequate control on present rx, reviewed in detail with pt > no change in rx needed = zocor 40 mg daily  > tol with marked elevation of hdl reviewed = good news, not bad  F/u with flp at cpx 07/2018

## 2018-04-25 NOTE — Assessment & Plan Note (Signed)
- ?   R Rotator cuff problem 08/25/2011 > referred back to [name] - changed to clinoril 02/14/15 due to hbp    Adequate control on present rx, reviewed in detail with pt > no change in rx needed  > f/u Dr [name]

## 2018-07-27 ENCOUNTER — Ambulatory Visit (INDEPENDENT_AMBULATORY_CARE_PROVIDER_SITE_OTHER)
Admission: RE | Admit: 2018-07-27 | Discharge: 2018-07-27 | Disposition: A | Discharge status: Home / Self Care | Payer: Medicare Other | Source: Ambulatory Visit | Attending: Internal Medicine | Admitting: Internal Medicine

## 2018-07-27 ENCOUNTER — Ambulatory Visit: Payer: Medicare Other | Admitting: Internal Medicine

## 2018-07-27 ENCOUNTER — Encounter: Payer: Self-pay | Admitting: Internal Medicine

## 2018-07-27 ENCOUNTER — Ambulatory Visit (INDEPENDENT_AMBULATORY_CARE_PROVIDER_SITE_OTHER): Payer: Medicare Other | Admitting: Internal Medicine

## 2018-07-27 VITALS — BP 150/76 | HR 74 | Ht 62.0 in | Wt 151.2 lb

## 2018-07-27 DIAGNOSIS — M129 Arthropathy, unspecified: Secondary | ICD-10-CM

## 2018-07-27 DIAGNOSIS — E7800 Pure hypercholesterolemia, unspecified: Secondary | ICD-10-CM

## 2018-07-27 DIAGNOSIS — M810 Age-related osteoporosis without current pathological fracture: Secondary | ICD-10-CM

## 2018-07-27 DIAGNOSIS — I10 Essential (primary) hypertension: Secondary | ICD-10-CM

## 2018-07-27 DIAGNOSIS — R058 Other specified cough: Secondary | ICD-10-CM

## 2018-07-27 DIAGNOSIS — R05 Cough: Secondary | ICD-10-CM | POA: Diagnosis not present

## 2018-07-27 DIAGNOSIS — Z23 Encounter for immunization: Secondary | ICD-10-CM

## 2018-07-27 LAB — CBC WITH DIFFERENTIAL/PLATELET
Basophils Absolute: 0 10*3/uL (ref 0.0–0.1)
Basophils Relative: 0.7 % (ref 0.0–3.0)
Eosinophils Absolute: 0.1 10*3/uL (ref 0.0–0.7)
Eosinophils Relative: 2.1 % (ref 0.0–5.0)
HCT: 38.6 % (ref 36.0–46.0)
Hemoglobin: 12.9 g/dL (ref 12.0–15.0)
Lymphocytes Relative: 39.4 % (ref 12.0–46.0)
Lymphs Abs: 1.6 10*3/uL (ref 0.7–4.0)
MCHC: 33.5 g/dL (ref 30.0–36.0)
MCV: 96.1 fl (ref 78.0–100.0)
Monocytes Absolute: 0.3 10*3/uL (ref 0.1–1.0)
Monocytes Relative: 6.9 % (ref 3.0–12.0)
Neutro Abs: 2.1 10*3/uL (ref 1.4–7.7)
Neutrophils Relative %: 50.9 % (ref 43.0–77.0)
Platelets: 238 10*3/uL (ref 150.0–400.0)
RBC: 4.02 Mil/uL (ref 3.87–5.11)
RDW: 13.9 % (ref 11.5–15.5)
WBC: 4.2 10*3/uL (ref 4.0–10.5)

## 2018-07-27 LAB — HEPATIC FUNCTION PANEL
ALT: 17 U/L (ref 0–35)
AST: 17 U/L (ref 0–37)
Albumin: 4.3 g/dL (ref 3.5–5.2)
Alkaline Phosphatase: 59 U/L (ref 39–117)
Bilirubin, Direct: 0.1 mg/dL (ref 0.0–0.3)
Total Bilirubin: 0.8 mg/dL (ref 0.2–1.2)
Total Protein: 7.1 g/dL (ref 6.0–8.3)

## 2018-07-27 LAB — BASIC METABOLIC PANEL
BUN: 16 mg/dL (ref 6–23)
CO2: 25 mEq/L (ref 19–32)
Calcium: 9.9 mg/dL (ref 8.4–10.5)
Chloride: 107 mEq/L (ref 96–112)
Creatinine, Ser: 0.91 mg/dL (ref 0.40–1.20)
GFR: 75.45 mL/min (ref >60.00)
Glucose, Bld: 94 mg/dL (ref 70–99)
Potassium: 3.9 mEq/L (ref 3.5–5.1)
Sodium: 142 mEq/L (ref 135–145)

## 2018-07-27 LAB — LIPID PANEL
Cholesterol: 262 mg/dL — ABNORMAL HIGH (ref 0–200)
HDL: 78 mg/dL (ref >39.00)
LDL Cholesterol: 169 mg/dL — ABNORMAL HIGH (ref 0–99)
NonHDL: 183.77
Total CHOL/HDL Ratio: 3
Triglycerides: 74 mg/dL (ref 0.0–149.0)
VLDL: 14.8 mg/dL (ref 0.0–40.0)

## 2018-07-27 LAB — TSH: TSH: 1.44 u[IU]/mL (ref 0.35–4.50)

## 2018-07-27 NOTE — Patient Instructions (Signed)
Please see patient coordinator before you leave today  to schedule bone density  Please remember to go to the lab department downstairs in the basement  for your tests - we will call you with the results when they are available.      Please schedule a follow up visit in 6  months but call sooner if needed

## 2018-07-27 NOTE — Progress Notes (Signed)
Subjective:    Patient ID: [name], female   DOB: 07-Dec-1930    MRN: [id number]   Brief patient profile:  34  yobf remote minimal smoking hx (quit 1957)  with seasonal rhinits flares better overall since started on immunotherapy May2008 completed aug 2018 >  Followed in pulmonary clinic with hyperlipidemia/djd     History of Present Illness  10/06/2017  f/u ov/[name] re: hyperlipidemia/ chronic rhinitis no long seeing allergy/bardelas Chief Complaint  Patient presents with  . Annual Exam    Pt is fasting. She is c/o nasal congestion and sneezing.   worse nasal symptoms x 3-4 weeks just using flonase / not zyrtec  No reg ex but Not limited by breathing from desired activities  rec  Take flonase consistently and supplement with the zyrtec up to 10 mg daily > if not effective return to Dr Shaune Leeks  Please remember to go to the lab department downstairs in the basement  for your tests - we will call you with the results when they are available.    04/25/2018  f/u ov/[name] re:  Allergic rhinitis/ hyperlipidemia/ djd  Chief Complaint  Patient presents with  . Follow-up    Pt states she has been doing okay since last visit and states she has been having some problems with postnasal drainage that is worse in the evenins.  Dyspnea:  Walked all over [employer] but not doing Y any more due to husband with dementia Cough: just pnds at hs  Sleeping: fine despite pnds  SABA use: no 02: no  Arthritis symptoms better with injections/ prn clinioril  And tol zocor 40 s muscle aches  rec No change rx     07/27/2018  f/u ov/[name] re:  Annual eval/   Rhinitis/ hyperlidpemia/ djd [name])  Chief Complaint  Patient presents with  . Follow-up    3 month visit - under more stress recently with husbands illness. Also time for annual check up she thinks.  Dyspnea:  Not limited by breathing from desired activities  Nor ex cp Cough: none Sleeping: flat/ one pillow/ pillow R knee      No obvious day to  day or daytime variability or assoc excess/ purulent sputum or mucus plugs or hemoptysis or cp or chest tightness, subjective wheeze or overt sinus or hb symptoms.   Sleeps ok as above  without nocturnal  or early am exacerbation  of respiratory  c/o's or need for noct saba. Also denies any obvious fluctuation of symptoms with weather or environmental changes or other aggravating or alleviating factors except as outlined above   No unusual exposure hx or h/o childhood pna/ asthma or knowledge of premature birth.  Current Allergies, Complete Past Medical History, Past Surgical History, Family History, and Social History were reviewed in [employer] record.  ROS  The following are not active complaints unless bolded Hoarseness, sore throat, dysphagia, dental problems, itching, sneezing,  nasal congestion or discharge of excess mucus or purulent secretions, ear ache,   fever, chills, sweats, unintended wt loss or wt gain, classically pleuritic or exertional cp,  orthopnea pnd or arm/hand swelling  or leg swelling, presyncope, palpitations, abdominal pain, anorexia, nausea, vomiting, diarrhea  or change in bowel habits or change in bladder habits, change in stools or change in urine, dysuria, hematuria,  rash, arthralgias, visual complaints, headache, numbness, weakness or ataxia or problems with walking or coordination,  change in mood or  memory.        Current  Meds  Medication Sig  . cetirizine (ZYRTEC) 10 MG tablet Take 10 mg by mouth at bedtime as needed (drippy nose, drainage, sneezing).   . fluticasone (FLONASE) 50 MCG/ACT nasal spray Place 1 spray into both nostrils 2 (two) times daily as needed for allergies or rhinitis.  . hydrocortisone 2.5 % cream Apply 1 application topically 2 (two) times daily.  . simvastatin (ZOCOR) 40 MG tablet Take 1 tablet (40 mg total) by mouth daily.  . Sodium Chloride-Sodium Bicarb (AYR SALINE NASAL RINSE NA) Rinses as needed for nasal  congestion  . sulindac (CLINORIL) 200 MG tablet Take 1 tablet (200 mg total) by mouth 2 (two) times daily.  . Vitamin D, Cholecalciferol, 400 units TABS Take by mouth.                Past Medical History:  ARTHRITIS (ICD-716.90)  Right clavicular head swelling chronic/ new R shoulder pain 06/14/2017  .......................Marland KitchenDr Elwin Sleight Hand Pain.................................................Marland Kitchen   [place] RHINITIS, CHRONIC (ICD-472.0).................. [place] [facility] / Bardelas - on immunotherapy since 01/2007 - stopped 04/2017  HYPERLIPIDEMIA (ICD-272.4)  -target LDL less than 160 > try off due to musle aches March 18, 2010 > rechallenged with half dose fall 2011 and tol ok  MORBID OBESITY (ICD-278.01)  - ideal 142 Target wt = 158 for BMI < 30  COLON POLYPS  - colonoscopy 06/24/2006 and 10/08/11 >> no recall due to age  OSTEOPENIA  -BMD 08/07/08 tspine -1.2, Left femur -.4, right femur +.2  - Repeat 01/02/2010 1.9 0.1 0.5  Health Maintenance.......................................................Marland KitchenWert  - CPX 07/27/2018  - Td 09/2003 > 03/27/2013  - Pneumovax 2000 age 43  And prevnar 13 02/27/15  - GYN [name] ([place])      Family History:  Family History Colon Cancer in mother > neg colon @ f/u 2013 > no further f/u per Dr Olevia Perches  she is an only child and knows nothing about her father's health      Social History:  Patient states former smoker quit age 62  Negative history of passive tobacco smoke exposure.  Exercise-2 times weekly  Caffeine-2 cups daily  Married with 4 children  Risk Factors:  Smoking Status: quit Age 63      Objective:   Physical Exam  wt 172 August 16, 2008 > 173 December 13, 2008 > 166 02/11/2011 >  03/21/2012  169 > 08/02/2012  165> 162 01/11/2013 >164 6/18/ 14 > 03/27/2013  163  > 05/31/2013 165> 06/11/14 163>  08/15/14  165 > 02/14/2015  162 > 02/27/2015   162 > 05/28/15 161> 08/12/2015   160 > 11/13/2015  159 >  03/02/2016   159  > 08/17/2016  160 > 06/14/2017     155 > 10/06/2017  155 > 04/25/2018   155 > 07/27/2018  151     amb bf nad     Vital signs reviewed - Note on arrival 02 sats  99% on RA and bp  150/76       HEENT: full dentures/ nl  turbinates bilaterally, and oropharynx. Nl external ear canals without cough reflex   NECK :  without JVD/Nodes/TM/ nl carotid upstrokes bilaterally   LUNGS: no acc muscle use,  Nl contour chest which is clear to A and P bilaterally without cough on insp or exp maneuvers   CV:  RRR  no s3 or murmur or increase in P2, and no edema/ pedal pulses ok    ABD:  soft and nontender with nl inspiratory  excursion in the supine position. No bruits or organomegaly appreciated, bowel sounds nl  MS:  Nl gait/ ext warm without deformities, calf tenderness, cyanosis or clubbing No obvious joint restrictions   SKIN: warm and dry without lesions    NEURO:  alert, approp, nl sensorium with  no motor or cerebellar deficits apparent.        Labs ordered/ reviewed:      Chemistry      Component Value Date/Time   NA 142 07/27/2018 1127   K 3.9 07/27/2018 1127   CL 107 07/27/2018 1127   CO2 25 07/27/2018 1127   BUN 16 07/27/2018 1127   CREATININE 0.91 07/27/2018 1127   CREATININE 0.82 07/16/2014 0947      Component Value Date/Time   CALCIUM 9.9 07/27/2018 1127   ALKPHOS 59 07/27/2018 1127   AST 17 07/27/2018 1127   ALT 17 07/27/2018 1127   BILITOT 0.8 07/27/2018 1127        Lab Results  Component Value Date   WBC 4.2 07/27/2018   HGB 12.9 07/27/2018   HCT 38.6 07/27/2018   MCV 96.1 07/27/2018   PLT 238.0 07/27/2018       EOS                                                               0.1                                    07/27/2018       Lab Results  Component Value Date   TSH 1.44 07/27/2018                   Assessment:

## 2018-07-28 NOTE — Progress Notes (Signed)
Spoke with pt and notified of results per Dr. Wert. Pt verbalized understanding and denied any questions. 

## 2018-07-29 ENCOUNTER — Encounter: Payer: Self-pay | Admitting: Internal Medicine

## 2018-07-29 NOTE — Assessment & Plan Note (Signed)
-  BMD 08/07/08 tspine -1.2, Left femur -.4, right femur +.2  - Repeat 01/02/2010 1.9 0.1 0.5   Last study 8 years prior to [place]  But she may benefit from rx as fully ambulatory with no significant life limiting co-morbidities so rec repeat study

## 2018-07-29 NOTE — Assessment & Plan Note (Signed)
Marginally Adequate control on present rx, reviewed in detail with pt > no change in rx needed  =  No salt, ex/ wt control

## 2018-07-29 NOTE — Assessment & Plan Note (Signed)
-  target LDL less than 160 > try off statins due to muscle aches March 18, 2010 > rechallenged with half dose fall 2011 and tol ok  - rechallenged with zocor 40 mg daily 05/31/2013 > tolerated well    Lab Results  Component Value Date   CHOL 262 (H) 07/27/2018   HDL 78.00 07/27/2018   LDLCALC 169 (H) 07/27/2018   LDLDIRECT 147.8 07/04/2013   TRIG 74.0 07/27/2018   CHOLHDL 3 07/27/2018     ldl trending up by hdl protective and doubt she can tolerate higher levels of zocor > no change rx needed

## 2018-07-29 NOTE — Assessment & Plan Note (Signed)
Adequate control on present rx, reviewed in detail with pt > no change in rx needed  = flonase and prn ayrtec

## 2018-07-29 NOTE — Assessment & Plan Note (Signed)
- ?   R Rotator cuff problem 08/25/2011 > referred back to [name] - changed to clinoril 02/14/15 due to hbp    Adequate control on present rx, reviewed in detail with pt > no change in rx needed

## 2018-07-31 NOTE — Progress Notes (Signed)
Spoke with pt and notified of results per Dr. Wert. Pt verbalized understanding and denied any questions. 

## 2018-09-19 ENCOUNTER — Other Ambulatory Visit: Payer: Self-pay

## 2018-09-19 ENCOUNTER — Ambulatory Visit: Payer: Medicare Other | Attending: Orthopedic Surgery | Admitting: Physical Therapy

## 2018-09-19 ENCOUNTER — Encounter: Payer: Self-pay | Admitting: Physical Therapy

## 2018-09-19 DIAGNOSIS — G8929 Other chronic pain: Secondary | ICD-10-CM | POA: Insufficient documentation

## 2018-09-19 DIAGNOSIS — M6281 Muscle weakness (generalized): Secondary | ICD-10-CM | POA: Diagnosis present

## 2018-09-19 DIAGNOSIS — M25571 Pain in right ankle and joints of right foot: Secondary | ICD-10-CM | POA: Insufficient documentation

## 2018-09-19 DIAGNOSIS — M25661 Stiffness of right knee, not elsewhere classified: Secondary | ICD-10-CM | POA: Diagnosis present

## 2018-09-19 DIAGNOSIS — R262 Difficulty in walking, not elsewhere classified: Secondary | ICD-10-CM | POA: Insufficient documentation

## 2018-09-19 DIAGNOSIS — R600 Localized edema: Secondary | ICD-10-CM | POA: Diagnosis present

## 2018-09-19 DIAGNOSIS — M25561 Pain in right knee: Secondary | ICD-10-CM | POA: Diagnosis present

## 2018-09-19 NOTE — Therapy (Signed)
[facility] [address]  [place], [place], [address] Phone: [phone]   Fax:  [phone]  Physical Therapy Evaluation  Patient Details  Name: [name] MRN: [id number] Date of Birth: 05/14/1931 Referring Provider (PT): [name], MD   Encounter Date: 09/19/2018  PT End of Session - 09/19/18 1059    Visit Number  1    Number of Visits  9    Date for PT Re-Evaluation  11/14/18    Authorization Type  [employer] Medicare    PT Start Time  [phone]    PT Stop Time  1053    PT Time Calculation (min)  57 min    Activity Tolerance  Patient tolerated treatment well;Patient limited by pain    Behavior During Therapy  [facility] for tasks assessed/performed       Past Medical History:  Diagnosis Date  . Arthropathy, unspecified, site unspecified    Dr. [name]...right clavicular head swelling 2003  . Colon polyp    colonoscopy 06-24-06 with no adenomatous change, hyperplasia  only  . Environmental allergies   . Healthcare maintenance    CPX 12-24-09, Td 1/05, pneumovax 2000 age 89, GYN [name] ([place])  . Hyperlipidemia    target less than 160 > try off muscle aches 03-18-10  . Morbid obesity ([place])    ideal = 142.  target wt = 158 for BMI < 30  . Osteopenia    BMD 08-07-08 tspin - 1.2, left femur - .4, right femur +.2.  repeat 01-02-10  1.9    0.1   0.5  . Rhinitis, chronic    Dr. [name]...on immunotherapy since 5/08    Past Surgical History:  Procedure Laterality Date  . BUNIONECTOMY  1982   bilateral  . FOOT SURGERY  1970   bilateral 5th toes, bones removed  . GANGLION CYST EXCISION  2000   3rd finger right hand  . TUBAL LIGATION  1971    There were no vitals filed for this visit.   Subjective Assessment - 09/19/18 0959    Subjective  Patient reporting R ankle pain for the past 6-8 months. Reports that she underwent R bunionectomy in the 1980's and has not had trouble in the great toe since then. Notes  that she didn't have pain in R ankle pain until onset of R knee pain. Knee pain started 3 years ago when she turned quickly and "wrenched" her knee but did not fall. Has gotten progressively worse since then. Reports R ankle pain occurred as a result of compensatory walking pattern from knee pain. Knee pain is located at medial joint line and with persistent swelling. Worse with full flexion or extension, better with heat and Tylenol. Ankle pain is  behind lateral malleolus and with swelling. Worse with walking or standing. Denies falls or inciting event.       Pertinent History  chronic rhinitis, osteopenia, HLD, arthropathy, B 5th toe bone removal, B bunionectomy     How long can you sit comfortably?  30 min d/t knee pain    How long can you stand comfortably?  30 min    How long can you walk comfortably?  depending on shoe, 30 min    Diagnostic tests  none recent    Patient Stated Goals  "I would not have any pain in my ankle"    Currently in Pain?  Yes    Pain Location  Ankle    Pain Orientation  Right;Posterior;Lateral    Pain Descriptors / Indicators  Sharp    Pain Type  Chronic pain         [facility] PT Assessment - 09/19/18 0001      Assessment   Medical Diagnosis  Bunion of L foot    Referring Provider (PT)  [name], MD    Onset Date/Surgical Date  03/19/18    Next MD Visit  --   not scheduled   Prior Therapy  yes, for shoulder      Precautions   Precautions  --   osteopenia     Restrictions   Weight Bearing Restrictions  No      Balance Screen   Has the patient fallen in the past 6 months  No    Has the patient had a decrease in activity level because of a fear of falling?   No    Is the patient reluctant to leave their home because of a fear of falling?   No      Home Environment   Living Environment  Private residence    Type of [place] Access  Stairs to enter    Entrance Stairs-Number of Steps  3    Entrance Stairs-Rails  Right    Home Layout  One  level    [place] - single point      Prior Function   Level of [place]  Retired    Leisure  walking, travelling       Cognition   Overall Cognitive Status  Within Functional Limits for tasks assessed      Observation/Other Assessments   Observations  R foot collapsed arch; wearing shoes without support    Focus on Therapeutic Outcomes (FOTO)   Ankle: 26 (74% limited, 59% predicted)      Sensation   Light Touch  Appears Intact      Coordination   Gross Motor Movements are Fluid and Coordinated  Yes      Posture/Postural Control   Posture/Postural Control  Postural limitations    Postural Limitations  Rounded Shoulders;Forward head      ROM / Strength   AROM / PROM / Strength  AROM;Strength;PROM      AROM   AROM Assessment Site  Ankle;Knee    Right/Left Knee  Right    Right Knee Extension  8   pain   Right Knee Flexion  77   pain   Right/Left Ankle  Right    Right Ankle Dorsiflexion  0    Right Ankle Plantar Flexion  40    Right Ankle Inversion  38    Right Ankle Eversion  20      PROM   PROM Assessment Site  Knee    Right/Left Knee  Right    Right Knee Extension  8   pain   Right Knee Flexion  82   pain     Strength   Strength Assessment Site  Hip;Knee;Ankle    Right/Left Hip  Right;Left    Right Hip Flexion  3+/5   pain   Right Hip ABduction  4/5   pain   Right Hip ADduction  4/5   pain   Left Hip Flexion  4+/5    Left Hip ABduction  4/5    Left Hip ADduction  4/5    Right/Left Knee  Right;Left    Right Knee Flexion  3+/5  pain   Right Knee Extension  --   pain   Left Knee Flexion  4+/5    Left Knee Extension  4+/5    Right/Left Ankle  Right;Left    Right Ankle Dorsiflexion  4-/5    Right Ankle Plantar Flexion  4/5    Right Ankle Inversion  4/5    Right Ankle Eversion  4/5    Left Ankle Dorsiflexion  4+/5    Left Ankle Plantar Flexion  4+/5    Left Ankle Inversion  4/5    Left Ankle Eversion  4/5       Palpation   Palpation comment  TTP in R posterolateral ankle with swelling, mild swelling in medial jt line of R knee      Ambulation/Gait   Gait Pattern  Step-through pattern;Decreased hip/knee flexion - right;Decreased step length - left;Decreased weight shift to left;Trunk flexed;Lateral trunk lean to right;Lateral trunk lean to left                Objective measurements completed on examination: See above findings.              PT Education - 09/19/18 1059    Education Details  prognosis, POC, HEP    Person(s) Educated  Patient    Methods  Explanation;Demonstration;Tactile cues;Verbal cues;Handout    Comprehension  Returned demonstration;Verbalized understanding       PT Short Term Goals - 09/19/18 1143      PT SHORT TERM GOAL #1   Title  Patient to be independent with initial HEP.    Time  4    Period  Weeks    Status  New    Target Date  10/17/18        PT Long Term Goals - 09/19/18 1144      PT LONG TERM GOAL #1   Title  Patient to be independent with advanced HEP.    Time  8    Period  Weeks    Status  New    Target Date  11/14/18      PT LONG TERM GOAL #2   Title  Patient to demonstrate R ankle AROM WFL and without pain limiting.     Time  8    Period  Weeks    Status  New    Target Date  11/14/18      PT LONG TERM GOAL #3   Title  Patient to demonstrate R knee AROM WFL and without pain limiting.     Time  8    Period  Weeks    Status  New    Target Date  11/14/18      PT LONG TERM GOAL #4   Title  Patient to demonstrate B LE strength >=4+/5.    Time  8    Period  Weeks    Status  New    Target Date  11/14/18      PT LONG TERM GOAL #5   Title  Patient to report tolerance of 1 hour of standing/walking without pain limiting.     Time  8    Period  Weeks    Status  New    Target Date  11/14/18             Plan - 09/19/18 1137    Clinical Impression Statement  Patient is an 85y/o F presenting to OPPT with c/o  chronic R ankle and knee pain. Reports R knee pain of 3 years  duration after an episode when she twisted and "wrenched" her knee; notes it has gotten progressively worse since then. Notes that R lateral ankle pain onset may have been caused by compensatory gait pattern d/t knee pain. Patient also with hx of bunionectomy and 5th digit bone removal on R foot. Knee pain worse with flexion, ankle pain worse with prolonged standing and walking. Patient today with limited R ankle and knee AROM, decreased R LE strength, edema in R lateral ankle and medial knee, and gait deviations. Educated on gentle strengthening HEP and advised not to push into pain. Patient reported understanding. Would benefit from skilled PT services 1x/week for 8 weeks to address aforementioned impairments.     Clinical Presentation  Stable    Clinical Decision Making  Low    Rehab Potential  Good    Clinical Impairments Affecting Rehab Potential  chronic rhinitis, osteopenia, HLD, arthropathy, B 5th toe bone removal, B bunionectomy     PT Frequency  1x / week    PT Duration  8 weeks    PT Treatment/Interventions  ADLs/Self Care Home Management;Cryotherapy;Electrical Stimulation;Iontophoresis 4mg /ml Dexamethasone;Functional mobility training;Stair training;Gait training;DME Instruction;Ultrasound;Moist Heat;Therapeutic activities;Therapeutic exercise;Balance training;Neuromuscular re-education;Patient/family education;Orthotic Fit/Training;Passive range of motion;Scar mobilization;Manual techniques;Dry needling;Energy conservation;Splinting;Taping;Vasopneumatic Device    PT Next Visit Plan  reassess HEP    Consulted and Agree with Plan of Care  Patient       Patient will benefit from skilled therapeutic intervention in order to improve the following deficits and impairments:  Decreased range of motion, Difficulty walking, Decreased activity tolerance, Pain, Decreased balance, Decreased scar mobility, Hypomobility, Impaired flexibility,  Improper body mechanics, Postural dysfunction, Increased edema, Decreased strength  Visit Diagnosis: Pain in right ankle and joints of right foot  Chronic pain of right knee  Stiffness of right knee, not elsewhere classified  Muscle weakness (generalized)  Difficulty in walking, not elsewhere classified  Localized edema     Problem List Patient Active Problem List   Diagnosis Date Noted  . Chest pain, musculoskeletal 06/14/2017  . Seasonal allergic conjunctivitis 07/13/2016  . Upper airway cough syndrome 06/29/2016  . Neck pain on left side 08/13/2015  . Other allergic rhinitis 05/23/2015  . Back pain with sciatica on L  08/18/2014  . Paresthesia of bilateral legs 06/11/2014  . Health care maintenance 12/17/2013  . Acute upper respiratory infection 01/14/2013  . Essential hypertension 08/03/2012  . Osteoporosis 12/24/2009  . COLONIC POLYPS, HYPERPLASTIC 11/23/2007  . Hyperlipidemia 08/15/2007  . Morbid obesity ([place]) 08/15/2007  . RHINITIS, CHRONIC 08/15/2007  . Arthropathy 08/15/2007    [name], PT, DPT 09/19/18 11:47 AM   [facility] [address]  [place], [place], [address] Phone: [phone]   Fax:  [phone]  Name: CHERILYNN SCHOMBURG MRN: [id number] Date of Birth: [birth year]

## 2018-09-28 ENCOUNTER — Ambulatory Visit: Payer: Medicare Other

## 2018-09-28 DIAGNOSIS — G8929 Other chronic pain: Secondary | ICD-10-CM

## 2018-09-28 DIAGNOSIS — R262 Difficulty in walking, not elsewhere classified: Secondary | ICD-10-CM

## 2018-09-28 DIAGNOSIS — M6281 Muscle weakness (generalized): Secondary | ICD-10-CM

## 2018-09-28 DIAGNOSIS — M25571 Pain in right ankle and joints of right foot: Secondary | ICD-10-CM | POA: Diagnosis not present

## 2018-09-28 DIAGNOSIS — M25561 Pain in right knee: Secondary | ICD-10-CM

## 2018-09-28 DIAGNOSIS — R600 Localized edema: Secondary | ICD-10-CM

## 2018-09-28 DIAGNOSIS — M25661 Stiffness of right knee, not elsewhere classified: Secondary | ICD-10-CM

## 2018-09-28 NOTE — Therapy (Signed)
[facility] [address]  [place], [place], [address] Phone: [phone]   Fax:  [phone]  Physical Therapy Treatment  Patient Details  Name: [name] MRN: [id number] Date of Birth: [birth year] Referring Provider (PT): [name], MD   Encounter Date: 09/28/2018  PT End of Session - 09/28/18 1115    Visit Number  2    Number of Visits  9    Date for PT Re-Evaluation  11/14/18    Authorization Type  UHC Medicare    PT Start Time  55    PT Stop Time  1210   ended visit with 10 min moist heat to lumbar spine    PT Time Calculation (min)  64 min    Activity Tolerance  Patient tolerated treatment well;Patient limited by pain    Behavior During Therapy  [facility] for tasks assessed/performed       Past Medical History:  Diagnosis Date  . Arthropathy, unspecified, site unspecified    Dr. [name]...right clavicular head swelling 2003  . Colon polyp    colonoscopy 06-24-06 with no adenomatous change, hyperplasia  only  . Environmental allergies   . Healthcare maintenance    CPX 12-24-09, Td 1/05, pneumovax 2000 age 27, GYN [name] ([place])  . Hyperlipidemia    target less than 160 > try off muscle aches 03-18-10  . Morbid obesity ([place])    ideal = 142.  target wt = 158 for BMI < 30  . Osteopenia    BMD 08-07-08 tspin - 1.2, left femur - .4, right femur +.2.  repeat 01-02-10  1.9    0.1   0.5  . Rhinitis, chronic    Dr. [name]...on immunotherapy since 5/08    Past Surgical History:  Procedure Laterality Date  . BUNIONECTOMY  1982   bilateral  . FOOT SURGERY  1970   bilateral 5th toes, bones removed  . GANGLION CYST EXCISION  2000   3rd finger right hand  . TUBAL LIGATION  1971    There were no vitals filed for this visit.  Subjective Assessment - 09/28/18 1112    Subjective  Pt. reporting primary concern today is R knee pain.      Pertinent History  chronic rhinitis, osteopenia, HLD,  arthropathy, B 5th toe bone removal, B bunionectomy     Diagnostic tests  none recent    Patient Stated Goals  "I would not have any pain in my ankle"    Currently in Pain?  Yes    Pain Score  5     Pain Location  Ankle    Pain Orientation  Right    Pain Type  Chronic pain    Multiple Pain Sites  Yes    Pain Score  7    Pain Location  Knee    Pain Orientation  Right                       [facility] Adult PT Treatment/Exercise - 09/28/18 1131      Self-Care   Self-Care  Other Self-Care Comments    Other Self-Care Comments   Pt. instructed on MD recommended ankle brace with handout issued to pt. for this for purchase on [employer]; instruction on proper ice/cold pack       Knee/Hip Exercises: Stretches   Gastroc Stretch  Right;2 reps;30 seconds    Gastroc Stretch Limitations  supine with strap and therapist guidance  Knee/Hip Exercises: Aerobic   Nustep  lvl 1, 7 min (UE/LE)      Knee/Hip Exercises: Supine   Quad Sets  Right;10 reps    Quad Sets Limitations  pillow squeeze    Short Arc Quad Sets  Right;10 reps;Strengthening    Short Arc Quad Sets Limitations  1#; bolster under knee     Straight Leg Raises  Right;10 reps    Straight Leg Raises Limitations  Cues required to increase speed of movement as pt. "going too slow"      Modalities   Modalities  Moist Heat      Moist Heat Therapy   Number Minutes Moist Heat  10 Minutes    Moist Heat Location  Lumbar Spine      Ankle Exercises: Supine   T-Band  R ankle IV, EV, PF, DF with yellow TB at foot and therapist anchored x 15 reps e[address] way    cueing for proper pacing and motion required             PT Education - 09/28/18 1218    Person(s) Educated  Patient    Methods  Explanation;Verbal cues;Handout    Comprehension  Verbal cues required;Need further instruction       PT Short Term Goals - 09/28/18 1115      PT SHORT TERM GOAL #1   Title  Patient to be independent with initial HEP.    Time  4     Period  Weeks    Status  On-going        PT Long Term Goals - 09/28/18 1115      PT LONG TERM GOAL #1   Title  Patient to be independent with advanced HEP.    Time  8    Period  Weeks    Status  On-going      PT LONG TERM GOAL #2   Title  Patient to demonstrate R ankle AROM WFL and without pain limiting.     Time  8    Period  Weeks    Status  On-going      PT LONG TERM GOAL #3   Title  Patient to demonstrate R knee AROM WFL and without pain limiting.     Time  8    Period  Weeks    Status  On-going      PT LONG TERM GOAL #4   Title  Patient to demonstrate B LE strength >=4+/5.    Time  8    Period  Weeks    Status  On-going      PT LONG TERM GOAL #5   Title  Patient to report tolerance of 1 hour of standing/walking without pain limiting.     Time  8    Period  Weeks    Status  On-going            Plan - 09/28/18 1122    Clinical Impression Statement  Pt. reporting she has not attempted HEP due to being busy caring for husband.  Instructed pt. to perform HEP daily for full benefit from therapy.  Required cueing for proper technique with HEP review today.  Able to tolerate all knee and ankle strengthening activities well in session.  Ended visit with pt. requesting hot pack to lumbar spine as complaint of some back pain.  Ended visit reporting improvement in pain levels in ankle/knee and lumbar spine.  Will continue to progress toward goals.  Rehab Potential  Good    Clinical Impairments Affecting Rehab Potential  chronic rhinitis, osteopenia, HLD, arthropathy, B 5th toe bone removal, B bunionectomy     PT Frequency  1x / week    PT Duration  8 weeks    PT Treatment/Interventions  ADLs/Self Care Home Management;Cryotherapy;Electrical Stimulation;Iontophoresis 4mg /ml Dexamethasone;Functional mobility training;Stair training;Gait training;DME Instruction;Ultrasound;Moist Heat;Therapeutic activities;Therapeutic exercise;Balance training;Neuromuscular  re-education;Patient/family education;Orthotic Fit/Training;Passive range of motion;Scar mobilization;Manual techniques;Dry needling;Energy conservation;Splinting;Taping;Vasopneumatic Device    Consulted and Agree with Plan of Care  Patient       Patient will benefit from skilled therapeutic intervention in order to improve the following deficits and impairments:  Decreased range of motion, Difficulty walking, Decreased activity tolerance, Pain, Decreased balance, Decreased scar mobility, Hypomobility, Impaired flexibility, Improper body mechanics, Postural dysfunction, Increased edema, Decreased strength  Visit Diagnosis: Pain in right ankle and joints of right foot  Chronic pain of right knee  Stiffness of right knee, not elsewhere classified  Muscle weakness (generalized)  Difficulty in walking, not elsewhere classified  Localized edema     Problem List Patient Active Problem List   Diagnosis Date Noted  . Chest pain, musculoskeletal 06/14/2017  . Seasonal allergic conjunctivitis 07/13/2016  . Upper airway cough syndrome 06/29/2016  . Neck pain on left side 08/13/2015  . Other allergic rhinitis 05/23/2015  . Back pain with sciatica on L  08/18/2014  . Paresthesia of bilateral legs 06/11/2014  . Health care maintenance 12/17/2013  . Acute upper respiratory infection 01/14/2013  . Essential hypertension 08/03/2012  . Osteoporosis 12/24/2009  . COLONIC POLYPS, HYPERPLASTIC 11/23/2007  . Hyperlipidemia 08/15/2007  . Morbid obesity ([place]) 08/15/2007  . RHINITIS, CHRONIC 08/15/2007  . Arthropathy 08/15/2007    Bess Harvest, PTA 09/28/18 12:38 PM    [facility] [address]  [place], [place], [address] Phone: [phone]   Fax:  [phone]  Name: [name] MRN: [id number] Date of Birth: 08-31-1931

## 2018-10-04 ENCOUNTER — Ambulatory Visit: Payer: Medicare Other

## 2018-10-04 DIAGNOSIS — M6281 Muscle weakness (generalized): Secondary | ICD-10-CM

## 2018-10-04 DIAGNOSIS — M25661 Stiffness of right knee, not elsewhere classified: Secondary | ICD-10-CM

## 2018-10-04 DIAGNOSIS — G8929 Other chronic pain: Secondary | ICD-10-CM

## 2018-10-04 DIAGNOSIS — M25561 Pain in right knee: Secondary | ICD-10-CM

## 2018-10-04 DIAGNOSIS — R600 Localized edema: Secondary | ICD-10-CM

## 2018-10-04 DIAGNOSIS — M25571 Pain in right ankle and joints of right foot: Secondary | ICD-10-CM | POA: Diagnosis not present

## 2018-10-04 DIAGNOSIS — R262 Difficulty in walking, not elsewhere classified: Secondary | ICD-10-CM

## 2018-10-04 NOTE — Therapy (Signed)
[facility] [address]  [place], [place], [address] Phone: [phone]   Fax:  [phone]  Physical Therapy Treatment  Patient Details  Name: [name] MRN: [id number] Date of Birth: 14-Oct-1930 Referring Provider (PT): [name], MD   Encounter Date: 10/04/2018  PT End of Session - 10/04/18 0959    Visit Number  3    Number of Visits  9    Date for PT Re-Evaluation  11/14/18    Authorization Type  UHC Medicare    PT Start Time  0930    PT Stop Time  1030   ended visit with 10 min moist heat   PT Time Calculation (min)  60 min    Activity Tolerance  Patient tolerated treatment well;Patient limited by pain    Behavior During Therapy  [facility] for tasks assessed/performed       Past Medical History:  Diagnosis Date  . Arthropathy, unspecified, site unspecified    Dr. [name]...right clavicular head swelling 2003  . Colon polyp    colonoscopy 06-24-06 with no adenomatous change, hyperplasia  only  . Environmental allergies   . Healthcare maintenance    CPX 12-24-09, Td 1/05, pneumovax 2000 age 64, GYN [name] ([facility] [place])  . Hyperlipidemia    target less than 160 > try off muscle aches 03-18-10  . Morbid obesity ([place])    ideal = 142.  target wt = 158 for BMI < 30  . Osteopenia    BMD 08-07-08 tspin - 1.2, left femur - .4, right femur +.2.  repeat 01-02-10  1.9    0.1   0.5  . Rhinitis, chronic    Dr. [name]...on immunotherapy since 5/08    Past Surgical History:  Procedure Laterality Date  . BUNIONECTOMY  1982   bilateral  . FOOT SURGERY  1970   bilateral 5th toes, bones removed  . GANGLION CYST EXCISION  2000   3rd finger right hand  . TUBAL LIGATION  1971    There were no vitals filed for this visit.  Subjective Assessment - 10/04/18 0958    Subjective  Pt. primary complaint today is R knee pain and stiffness.  Has found it difficult to make time for HEP due to "24/7 caring for husband".       Pertinent History  chronic rhinitis, osteopenia, HLD, arthropathy, B 5th toe bone removal, B bunionectomy     Diagnostic tests  none recent    Patient Stated Goals  "I would not have any pain in my ankle"    Currently in Pain?  No/denies    Pain Score  0-No pain   Pain rising to 5/10    Pain Location  Ankle    Pain Orientation  Right    Pain Type  Chronic pain    Multiple Pain Sites  Yes    Pain Score  7    Pain Location  Knee    Pain Orientation  Right    Pain Descriptors / Indicators  --   "stiffness"   Pain Type  Acute pain                       [facility] Adult PT Treatment/Exercise - 10/04/18 1003      Knee/Hip Exercises: Aerobic   Recumbent Bike  Lvl 1, 7 min - partial revolutions for ROM       Knee/Hip Exercises: Supine   Short Arc [employer]  Right;Strengthening   x 12 rpes    Straight Leg Raises  Right   x 12 reps    Straight Leg Raises Limitations  cues for pacing required     Knee Flexion  Right;15 reps;AROM    Knee Flexion Limitations  with heel on orange p-ball       Moist Heat Therapy   Number Minutes Moist Heat  10 Minutes    Moist Heat Location  Lumbar Spine   upon pt. request due to back pain     Manual Therapy   Manual Therapy  Soft tissue mobilization;Joint mobilization;Passive ROM    Manual therapy comments  supine     Joint Mobilization  R patellar mob all directions (severly limited); R knee A/P mobs for improved ROM     Soft tissue mobilization  STM to R distall     Passive ROM  Manual R ITB, glute (KTOS), gastroc stretch with therapist x 30 sec; R knee flexion stretch with therapist 2 x 20 sec    mod tightness noted in R ITB     Ankle Exercises: Supine   T-Band  R ankle IV, EV, PF, DF with yellow TB at foot and therapist anchored x 15 reps e[address] way                PT Short Term Goals - 09/28/18 1115      PT SHORT TERM GOAL #1   Title  Patient to be independent with initial HEP.    Time  4    Period  Weeks    Status   On-going        PT Long Term Goals - 09/28/18 1115      PT LONG TERM GOAL #1   Title  Patient to be independent with advanced HEP.    Time  8    Period  Weeks    Status  On-going      PT LONG TERM GOAL #2   Title  Patient to demonstrate R ankle AROM WFL and without pain limiting.     Time  8    Period  Weeks    Status  On-going      PT LONG TERM GOAL #3   Title  Patient to demonstrate R knee AROM WFL and without pain limiting.     Time  8    Period  Weeks    Status  On-going      PT LONG TERM GOAL #4   Title  Patient to demonstrate B LE strength >=4+/5.    Time  8    Period  Weeks    Status  On-going      PT LONG TERM GOAL #5   Title  Patient to report tolerance of 1 hour of standing/walking without pain limiting.     Time  8    Period  Weeks    Status  On-going            Plan - 10/04/18 1000    Clinical Impression Statement  Pt. primary complaint today is R knee pain and states ankle has been feeling better recently.  Pt. noting difficulty making time to get to HEP due to caring for husband "24/7".  Pt. demonstrating restricted R patellar mobility today and R LE tightness in ITB, glutes which was addressed with MT.  Knee ROM not formally measured today however with visible improvement from when initially measured making progress toward LTG #3.  Session focusing on R knee  and ankle ROM and gentle strengthening which was tolerated well.  Pt. strongly encouraged to make time for HEP for full benefit from therapy.  Pt. husband scheduled for surgery on Tuesday of next week and pt. verbalizing that she may possibly have to reschedule next therapy.  Will continue to progress toward goals per pt. in coming visits.      Clinical Impairments Affecting Rehab Potential  chronic rhinitis, osteopenia, HLD, arthropathy, B 5th toe bone removal, B bunionectomy     PT Frequency  1x / week    PT Duration  8 weeks    PT Treatment/Interventions  ADLs/Self Care Home  Management;Cryotherapy;Electrical Stimulation;Iontophoresis 4mg /ml Dexamethasone;Functional mobility training;Stair training;Gait training;DME Instruction;Ultrasound;Moist Heat;Therapeutic activities;Therapeutic exercise;Balance training;Neuromuscular re-education;Patient/family education;Orthotic Fit/Training;Passive range of motion;Scar mobilization;Manual techniques;Dry needling;Energy conservation;Splinting;Taping;Vasopneumatic Device    PT Next Visit Plan  knee and ankle ROM and gentle strengthening; patellar mobility; modalities prn    Consulted and Agree with Plan of Care  Patient       Patient will benefit from skilled therapeutic intervention in order to improve the following deficits and impairments:  Decreased range of motion, Difficulty walking, Decreased activity tolerance, Pain, Decreased balance, Decreased scar mobility, Hypomobility, Impaired flexibility, Improper body mechanics, Postural dysfunction, Increased edema, Decreased strength  Visit Diagnosis: Pain in right ankle and joints of right foot  Chronic pain of right knee  Stiffness of right knee, not elsewhere classified  Muscle weakness (generalized)  Difficulty in walking, not elsewhere classified  Localized edema     Problem List Patient Active Problem List   Diagnosis Date Noted  . Chest pain, musculoskeletal 06/14/2017  . Seasonal allergic conjunctivitis 07/13/2016  . Upper airway cough syndrome 06/29/2016  . Neck pain on left side 08/13/2015  . Other allergic rhinitis 05/23/2015  . Back pain with sciatica on L  08/18/2014  . Paresthesia of bilateral legs 06/11/2014  . Health care maintenance 12/17/2013  . Acute upper respiratory infection 01/14/2013  . Essential hypertension 08/03/2012  . Osteoporosis 12/24/2009  . COLONIC POLYPS, HYPERPLASTIC 11/23/2007  . Hyperlipidemia 08/15/2007  . Morbid obesity ([place]) 08/15/2007  . RHINITIS, CHRONIC 08/15/2007  . Arthropathy 08/15/2007    Bess Harvest,  PTA 10/04/18 10:55 AM    [facility] [address]  [place], [place], [address] Phone: [phone]   Fax:  [phone]  Name: AYBREE LANYON MRN: [id number] Date of Birth: [birth year]

## 2018-10-12 ENCOUNTER — Ambulatory Visit: Payer: Medicare Other | Admitting: Physical Therapy

## 2018-10-31 ENCOUNTER — Ambulatory Visit: Payer: Medicare Other | Attending: Orthopedic Surgery

## 2018-10-31 DIAGNOSIS — M25571 Pain in right ankle and joints of right foot: Secondary | ICD-10-CM

## 2018-10-31 DIAGNOSIS — R600 Localized edema: Secondary | ICD-10-CM | POA: Insufficient documentation

## 2018-10-31 DIAGNOSIS — G8929 Other chronic pain: Secondary | ICD-10-CM | POA: Insufficient documentation

## 2018-10-31 DIAGNOSIS — R262 Difficulty in walking, not elsewhere classified: Secondary | ICD-10-CM | POA: Diagnosis present

## 2018-10-31 DIAGNOSIS — M6281 Muscle weakness (generalized): Secondary | ICD-10-CM | POA: Insufficient documentation

## 2018-10-31 DIAGNOSIS — M25561 Pain in right knee: Secondary | ICD-10-CM | POA: Insufficient documentation

## 2018-10-31 DIAGNOSIS — M25661 Stiffness of right knee, not elsewhere classified: Secondary | ICD-10-CM | POA: Insufficient documentation

## 2018-10-31 NOTE — Therapy (Signed)
[facility] [address]  [place], [place], [address] Phone: [phone]   Fax:  [phone]  Physical Therapy Treatment  Patient Details  Name: [name] MRN: [id number] Date of Birth: 02-16-1931 Referring Provider (PT): [name], MD   Encounter Date: 10/31/2018  PT End of Session - 10/31/18 0945    Visit Number  4    Number of Visits  9    Date for PT Re-Evaluation  11/14/18    Authorization Type  [employer]HC Medicare    PT Start Time  0930    PT Stop Time  1025   ended with 10 min ice pack to R knee    PT Time Calculation (min)  55 min    Activity Tolerance  Patient tolerated treatment well;Patient limited by pain    Behavior During Therapy  [facility] for tasks assessed/performed       Past Medical History:  Diagnosis Date  . Arthropathy, unspecified, site unspecified    Dr. [name]...right clavicular head swelling 2003  . Colon polyp    colonoscopy 06-24-06 with no adenomatous change, hyperplasia  only  . Environmental allergies   . Healthcare maintenance    CPX 12-24-09, Td 1/05, pneumovax 2000 age 11, GYN [name] ([place])  . Hyperlipidemia    target less than 160 > try off muscle aches 03-18-10  . Morbid obesity ([place])    ideal = 142.  target wt = 158 for BMI < 30  . Osteopenia    BMD 08-07-08 tspin - 1.2, left femur - .4, right femur +.2.  repeat 01-02-10  1.9    0.1   0.5  . Rhinitis, chronic    Dr. [name]...on immunotherapy since 5/08    Past Surgical History:  Procedure Laterality Date  . BUNIONECTOMY  1982   bilateral  . FOOT SURGERY  1970   bilateral 5th toes, bones removed  . GANGLION CYST EXCISION  2000   3rd finger right hand  . TUBAL LIGATION  1971    There were no vitals filed for this visit.  Subjective Assessment - 10/31/18 0939    Subjective  Pt. reporting she has not been to therapy over past few weeks due to having to care for husband being in hospital.      Pertinent  History  chronic rhinitis, osteopenia, HLD, arthropathy, B 5th toe bone removal, B bunionectomy     Patient Stated Goals  "I would not have any pain in my ankle"    Currently in Pain?  Yes    Pain Score  7     Pain Location  Knee    Pain Orientation  Right    Pain Descriptors / Indicators  Sharp    Pain Type  Chronic pain    Pain Score  0   ankle pain up to 3/10 at times while walking    Pain Location  Ankle    Pain Orientation  Right    Aggravating Factors   walking                        [facility] Adult PT Treatment/Exercise - 10/31/18 0938      Knee/Hip Exercises: Stretches   Passive Hamstring Stretch  Right;1 rep;30 seconds    Passive Hamstring Stretch Limitations  strap and heavy cuein from therpaist     Hip Flexor Stretch  Right;1 rep;30 seconds    Hip Flexor Stretch Limitations  strap  Gastroc Stretch  Right;1 rep;30 seconds   Heavy cueing from therapist   Gastroc Stretch Limitations  strap       Knee/Hip Exercises: Aerobic   Recumbent Bike  Lvl 1, 7 min - partial revolutions for ROM       Knee/Hip Exercises: Supine   Quad Sets  Right;10 reps   Cues required for proper motion    Quad Sets Limitations  quad set 5" x 10 reps     Short Arc [employer]  Right;15 reps    Short Arc Quad Sets Limitations  Manual inferior/superior patellar mobs with motion from therapist     Heel Slides  Right;10 reps    Heel Slides Limitations  with strap assist       Ankle Exercises: Supine   T-Band  R ankle IV, EV, PF, DF with yellow TB at foot and therapist anchored x 15 reps eahc way                PT Short Term Goals - 10/31/18 1213      PT SHORT TERM GOAL #1   Title  Patient to be independent with initial HEP.    Time  4    Period  Weeks    Status  On-going        PT Long Term Goals - 09/28/18 1115      PT LONG TERM GOAL #1   Title  Patient to be independent with advanced HEP.    Time  8    Period  Weeks    Status  On-going      PT LONG TERM  GOAL #2   Title  Patient to demonstrate R ankle AROM WFL and without pain limiting.     Time  8    Period  Weeks    Status  On-going      PT LONG TERM GOAL #3   Title  Patient to demonstrate R knee AROM WFL and without pain limiting.     Time  8    Period  Weeks    Status  On-going      PT LONG TERM GOAL #4   Title  Patient to demonstrate B LE strength >=4+/5.    Time  8    Period  Weeks    Status  On-going      PT LONG TERM GOAL #5   Title  Patient to report tolerance of 1 hour of standing/walking without pain limiting.     Time  8    Period  Weeks    Status  On-going            Plan - 10/31/18 0945    Clinical Impression Statement  Pt. reporting she has not been unable to attend therapy over past three weeks due to having to care for husband.  Reports she has been unable to perform HEP due to being busy with husband.  R knee AROM nearly unchanged today and pt. reporting R knee pain with wt. bearing is primary concern today noting R ankle/foot much improved.  Session split focus on ankle and R knee with gentle strengthening and ROM activities tolerated well.  Pt. with visible improvement in R knee extension following ROM activities.  Encouraged pt. to make time for HEP performance daily for full benefit from therapy.  Ended visit with ice pack applied to R knee as she had visible swelling which was present to start session.  Will monitor adherence to HEP over upcoming  visits and continue to progress per pt.      Rehab Potential  Good    Clinical Impairments Affecting Rehab Potential  chronic rhinitis, osteopenia, HLD, arthropathy, B 5th toe bone removal, B bunionectomy     PT Treatment/Interventions  ADLs/Self Care Home Management;Cryotherapy;Electrical Stimulation;Iontophoresis 4mg /ml Dexamethasone;Functional mobility training;Stair training;Gait training;DME Instruction;Ultrasound;Moist Heat;Therapeutic activities;Therapeutic exercise;Balance training;Neuromuscular  re-education;Patient/family education;Orthotic Fit/Training;Passive range of motion;Scar mobilization;Manual techniques;Dry needling;Energy conservation;Splinting;Taping;Vasopneumatic Device    PT Next Visit Plan  knee and ankle ROM and gentle strengthening; patellar mobility; modalities prn    Consulted and Agree with Plan of Care  Patient       Patient will benefit from skilled therapeutic intervention in order to improve the following deficits and impairments:  Decreased range of motion, Difficulty walking, Decreased activity tolerance, Pain, Decreased balance, Decreased scar mobility, Hypomobility, Impaired flexibility, Improper body mechanics, Postural dysfunction, Increased edema, Decreased strength  Visit Diagnosis: Pain in right ankle and joints of right foot  Chronic pain of right knee  Stiffness of right knee, not elsewhere classified  Muscle weakness (generalized)  Difficulty in walking, not elsewhere classified  Localized edema     Problem List Patient Active Problem List   Diagnosis Date Noted  . Chest pain, musculoskeletal 06/14/2017  . Seasonal allergic conjunctivitis 07/13/2016  . Upper airway cough syndrome 06/29/2016  . Neck pain on left side 08/13/2015  . Other allergic rhinitis 05/23/2015  . Back pain with sciatica on L  08/18/2014  . Paresthesia of bilateral legs 06/11/2014  . Health care maintenance 12/17/2013  . Acute upper respiratory infection 01/14/2013  . Essential hypertension 08/03/2012  . Osteoporosis 12/24/2009  . COLONIC POLYPS, HYPERPLASTIC 11/23/2007  . Hyperlipidemia 08/15/2007  . Morbid obesity ([place]) 08/15/2007  . RHINITIS, CHRONIC 08/15/2007  . Arthropathy 08/15/2007    Bess Harvest, PTA 10/31/18 12:20 PM    [facility] [address]  [place], [place], [address] Phone: [phone]   Fax:  [phone]  Name: [name] MRN: [id number] Date of Birth:  Jun 29, 1931

## 2018-11-03 ENCOUNTER — Ambulatory Visit: Payer: Medicare Other

## 2018-11-03 DIAGNOSIS — M25571 Pain in right ankle and joints of right foot: Secondary | ICD-10-CM | POA: Diagnosis not present

## 2018-11-03 DIAGNOSIS — R262 Difficulty in walking, not elsewhere classified: Secondary | ICD-10-CM

## 2018-11-03 DIAGNOSIS — R600 Localized edema: Secondary | ICD-10-CM

## 2018-11-03 DIAGNOSIS — M25661 Stiffness of right knee, not elsewhere classified: Secondary | ICD-10-CM

## 2018-11-03 DIAGNOSIS — G8929 Other chronic pain: Secondary | ICD-10-CM

## 2018-11-03 DIAGNOSIS — M25561 Pain in right knee: Secondary | ICD-10-CM

## 2018-11-03 DIAGNOSIS — M6281 Muscle weakness (generalized): Secondary | ICD-10-CM

## 2018-11-03 NOTE — Therapy (Signed)
[facility] [address]  [place], [place], [address] Phone: [phone]   Fax:  [phone]  Physical Therapy Treatment  Patient Details  Name: [name] MRN: [id number] Date of Birth: 1931-09-05 Referring Provider (PT): [name], MD   Encounter Date: 11/03/2018  PT End of Session - 11/03/18 0932    Visit Number  5    Number of Visits  9    Date for PT Re-Evaluation  11/14/18    Authorization Type  [employer] Medicare    PT Start Time  0930    PT Stop Time  1008    PT Time Calculation (min)  38 min    Activity Tolerance  Patient tolerated treatment well;Patient limited by pain    Behavior During Therapy  [facility] for tasks assessed/performed       Past Medical History:  Diagnosis Date  . Arthropathy, unspecified, site unspecified    Dr. [name]...right clavicular head swelling 2003  . Colon polyp    colonoscopy 06-24-06 with no adenomatous change, hyperplasia  only  . Environmental allergies   . Healthcare maintenance    CPX 12-24-09, Td 1/05, pneumovax 2000 age 85, GYN [name] ([facility])  . Hyperlipidemia    target less than 160 > try off muscle aches 03-18-10  . Morbid obesity ([place])    ideal = 142.  target wt = 158 for BMI < 30  . Osteopenia    BMD 08-07-08 tspin - 1.2, left femur - .4, right femur +.2.  repeat 01-02-10  1.9    0.1   0.5  . Rhinitis, chronic    Dr. [name]...on immunotherapy since 5/08    Past Surgical History:  Procedure Laterality Date  . BUNIONECTOMY  1982   bilateral  . FOOT SURGERY  1970   bilateral 5th toes, bones removed  . GANGLION CYST EXCISION  2000   3rd finger right hand  . TUBAL LIGATION  1971    There were no vitals filed for this visit.  Subjective Assessment - 11/03/18 0943    Subjective  Pt. reporting she wishes to look into a hold on therapy at upcoming session due to having to care for husband and unable to perform home program due to this.      Pertinent  History  chronic rhinitis, osteopenia, HLD, arthropathy, B 5th toe bone removal, B bunionectomy     Diagnostic tests  none recent    Patient Stated Goals  "I would not have any pain in my ankle"    Currently in Pain?  Yes    Pain Score  6     Pain Location  Knee    Pain Orientation  Right    Pain Descriptors / Indicators  Sharp    Pain Type  Chronic pain    Multiple Pain Sites  Yes    Pain Score  0   up to 5/10 pain with prolonged standing/walking    Pain Location  Ankle    Pain Orientation  Right    Pain Descriptors / Indicators  --   "stiffness"   Pain Type  Acute pain                       [facility] Adult PT Treatment/Exercise - 11/03/18 1017      Self-Care   Self-Care  Other Self-Care Comments    Other Self-Care Comments   Discussed existing HEP and need to focus on knee  ROM and gentle strengthening activities as ankle improving and knee primary concern of pt.       Knee/Hip Exercises: Aerobic   Nustep  Lvl 3, 6 min (UE/LE) - cues provided to increase knee flexion/extension ROM       Knee/Hip Exercises: Standing   Heel Raises  Both;10 reps;3 seconds    Heel Raises Limitations  wall tolerated at sink    Knee Flexion  Right;10 reps;Strengthening    Knee Flexion Limitations  yellow looped TB around ankles at sink       Knee/Hip Exercises: Supine   Quad Sets  Right;10 reps    Quad Sets Limitations  quad set 5" x 15 reps into pillow    Heel Slides  Right;10 reps    Heel Slides Limitations  towel - cues for end range flexion stretch     Straight Leg Raises  Right   x 12 rpes    Straight Leg Raises Limitations  Cues for quad set prior to each rep      Manual Therapy   Manual Therapy  Joint mobilization    Manual therapy comments  supine - pillow under knee     Joint Mobilization  R patellar mob all directions (severly limited) - improved mobility and improved tolerance for R knee ROM activities following mobs       Ankle Exercises: Supine   T-Band  R ankle  PF, DF with yellow TB x 15 rpes each way              PT Education - 11/03/18 1032    Education Details  HEP update; yellow looped TB issued to pt. for standing hamstring curl, seated heel slide with strap, reissued to pt. due to unable to read small font on original handout, SLR, heel raise at counter    Person(s) Educated  Patient    Methods  Explanation;Demonstration;Verbal cues;Handout    Comprehension  Verbalized understanding;Returned demonstration;Verbal cues required;Need further instruction       PT Short Term Goals - 10/31/18 1213      PT SHORT TERM GOAL #1   Title  Patient to be independent with initial HEP.    Time  4    Period  Weeks    Status  On-going        PT Long Term Goals - 09/28/18 1115      PT LONG TERM GOAL #1   Title  Patient to be independent with advanced HEP.    Time  8    Period  Weeks    Status  On-going      PT LONG TERM GOAL #2   Title  Patient to demonstrate R ankle AROM WFL and without pain limiting.     Time  8    Period  Weeks    Status  On-going      PT LONG TERM GOAL #3   Title  Patient to demonstrate R knee AROM WFL and without pain limiting.     Time  8    Period  Weeks    Status  On-going      PT LONG TERM GOAL #4   Title  Patient to demonstrate B LE strength >=4+/5.    Time  8    Period  Weeks    Status  On-going      PT LONG TERM GOAL #5   Title  Patient to report tolerance of 1 hour of standing/walking without pain limiting.  Time  8    Period  Weeks    Status  On-going            Plan - 11/03/18 0934    Clinical Impression Statement  [name] reporting she has been unable to make time for HEP and may need to go on hold from therapy due to caring for husband "24/7".  Supervising PT made aware of this and approving plan for pt. to transition to home program after next session per pt.  Session focused on R knee ROM and gentle strengthening activities as pt. primary concern is knee pain and feels ankle is  much improved since starting therapy.  ROM and gentle strengthening activities focused toward improving R knee flexion/extension as pt. holding R LE in resting ~ 15 dg flexion position with wt. shift away from R LE in standing and with ambulation.  Pt. educated to bear wt. evenly over B LE and to perform updated HEP issued to her today via handout at least one time over weekend for final review of this at upcoming visit.  Pt. verbalized understanding.  Ended visit with pt. reporting R knee pain decrease from 6/10>2/10 and ambulating out of clinic with improved wt. bearing and AROM extension at R knee.     Rehab Potential  Good    Clinical Impairments Affecting Rehab Potential  chronic rhinitis, osteopenia, HLD, arthropathy, B 5th toe bone removal, B bunionectomy     PT Treatment/Interventions  ADLs/Self Care Home Management;Cryotherapy;Electrical Stimulation;Iontophoresis 4mg /ml Dexamethasone;Functional mobility training;Stair training;Gait training;DME Instruction;Ultrasound;Moist Heat;Therapeutic activities;Therapeutic exercise;Balance training;Neuromuscular re-education;Patient/family education;Orthotic Fit/Training;Passive range of motion;Scar mobilization;Manual techniques;Dry needling;Energy conservation;Splinting;Taping;Vasopneumatic Device    PT Next Visit Plan  30-day hold per pt. request; final goal testing, HEP review; FOTO     Consulted and Agree with Plan of Care  Patient       Patient will benefit from skilled therapeutic intervention in order to improve the following deficits and impairments:  Decreased range of motion, Difficulty walking, Decreased activity tolerance, Pain, Decreased balance, Decreased scar mobility, Hypomobility, Impaired flexibility, Improper body mechanics, Postural dysfunction, Increased edema, Decreased strength  Visit Diagnosis: Pain in right ankle and joints of right foot  Chronic pain of right knee  Stiffness of right knee, not elsewhere classified  Muscle  weakness (generalized)  Difficulty in walking, not elsewhere classified  Localized edema     Problem List Patient Active Problem List   Diagnosis Date Noted  . Chest pain, musculoskeletal 06/14/2017  . Seasonal allergic conjunctivitis 07/13/2016  . Upper airway cough syndrome 06/29/2016  . Neck pain on left side 08/13/2015  . Other allergic rhinitis 05/23/2015  . Back pain with sciatica on L  08/18/2014  . Paresthesia of bilateral legs 06/11/2014  . Health care maintenance 12/17/2013  . Acute upper respiratory infection 01/14/2013  . Essential hypertension 08/03/2012  . Osteoporosis 12/24/2009  . COLONIC POLYPS, HYPERPLASTIC 11/23/2007  . Hyperlipidemia 08/15/2007  . Morbid obesity ([place]) 08/15/2007  . RHINITIS, CHRONIC 08/15/2007  . Arthropathy 08/15/2007    Bess Harvest, PTA 11/03/18 10:46 AM   [facility] [address]  [place], [place], [address] Phone: [phone]   Fax:  [phone]  Name: FARHANA FELLOWS MRN: [id number] Date of Birth: 1930/12/11

## 2018-11-07 ENCOUNTER — Encounter: Payer: Self-pay | Admitting: Physical Therapy

## 2018-11-07 ENCOUNTER — Ambulatory Visit: Payer: Medicare Other | Attending: Orthopedic Surgery | Admitting: Physical Therapy

## 2018-11-07 DIAGNOSIS — R262 Difficulty in walking, not elsewhere classified: Secondary | ICD-10-CM | POA: Diagnosis present

## 2018-11-07 DIAGNOSIS — R600 Localized edema: Secondary | ICD-10-CM | POA: Insufficient documentation

## 2018-11-07 DIAGNOSIS — M25561 Pain in right knee: Secondary | ICD-10-CM | POA: Insufficient documentation

## 2018-11-07 DIAGNOSIS — M25571 Pain in right ankle and joints of right foot: Secondary | ICD-10-CM

## 2018-11-07 DIAGNOSIS — M25661 Stiffness of right knee, not elsewhere classified: Secondary | ICD-10-CM | POA: Diagnosis present

## 2018-11-07 DIAGNOSIS — G8929 Other chronic pain: Secondary | ICD-10-CM

## 2018-11-07 DIAGNOSIS — M6281 Muscle weakness (generalized): Secondary | ICD-10-CM | POA: Diagnosis present

## 2018-11-07 NOTE — Therapy (Signed)
[facility] [address]  [place], [place], [address] Phone: [phone]   Fax:  [phone]  Physical Therapy Treatment  Patient Details  Name: [name] MRN: [id number] Date of Birth: 10/13/1930 Referring Provider (PT): [name], MD   Encounter Date: 11/07/2018  PT End of Session - 11/07/18 1013    Visit Number  6    Number of Visits  9    Date for PT Re-Evaluation  11/14/18    Authorization Type  [employer] Medicare    PT Start Time  [phone]    PT Stop Time  1021   ice pack   PT Time Calculation (min)  50 min    Activity Tolerance  Patient tolerated treatment well    Behavior During Therapy  [facility] for tasks assessed/performed       Past Medical History:  Diagnosis Date  . Arthropathy, unspecified, site unspecified    Dr. [name]...right clavicular head swelling 2003  . Colon polyp    colonoscopy 06-24-06 with no adenomatous change, hyperplasia  only  . Environmental allergies   . Healthcare maintenance    CPX 12-24-09, Td 1/05, pneumovax 2000 age 31, GYN [name] ([facility])  . Hyperlipidemia    target less than 160 > try off muscle aches 03-18-10  . Morbid obesity ([place])    ideal = 142.  target wt = 158 for BMI < 30  . Osteopenia    BMD 08-07-08 tspin - 1.2, left femur - .4, right femur +.2.  repeat 01-02-10  1.9    0.1   0.5  . Rhinitis, chronic    Dr. [name]...on immunotherapy since 5/08    Past Surgical History:  Procedure Laterality Date  . BUNIONECTOMY  1982   bilateral  . FOOT SURGERY  1970   bilateral 5th toes, bones removed  . GANGLION CYST EXCISION  2000   3rd finger right hand  . TUBAL LIGATION  1971    There were no vitals filed for this visit.  Subjective Assessment - 11/07/18 0933    Subjective  Patient reports that she had a lot of pain last night from her neck down to her toes. Reports that she had a busy day that day. Feeling better today, with only knee pain remaining.     Pertinent History  chronic rhinitis, osteopenia, HLD, arthropathy, B 5th toe bone removal, B bunionectomy     Diagnostic tests  none recent    Patient Stated Goals  "I would not have any pain in my ankle"    Currently in Pain?  Yes    Pain Score  6     Pain Location  Knee    Pain Orientation  Right    Pain Descriptors / Indicators  Sharp    Pain Type  Chronic pain                       [facility] Adult PT Treatment/Exercise - 11/07/18 0001      Exercises   Exercises  Knee/Hip      Knee/Hip Exercises: Stretches   Passive Hamstring Stretch  Right;30 seconds;2 reps    Passive Hamstring Stretch Limitations  supine with strap    ITB Stretch  Right;2 reps;30 seconds    ITB Stretch Limitations  supine with strap      Knee/Hip Exercises: Aerobic   Nustep  Lvl 2, 6 min LEs only      Knee/Hip Exercises: Standing  Terminal Knee Extension  Strengthening;Right;1 set;10 reps;Theraband    Theraband Level (Terminal Knee Extension)  Level 4 (Blue)    Terminal Knee Extension Limitations  10x3" with 1 UE support on chair   cues for proper form     Knee/Hip Exercises: Seated   Other Seated Knee/Hip Exercises  sitting R knee flexion stretch 10x5" to tolerance      Knee/Hip Exercises: Supine   Quad Sets  Strengthening;Right;1 set;10 reps    Quad Sets Limitations  10x5" with anle on 1/2 bolster    Bridges with [employer]  Strengthening;Both;1 set;10 reps   decreased speed but good form   Straight Leg Raises  Strengthening;Right;1 set;10 reps    Straight Leg Raises Limitations  Cues for quad set prior to each rep      Modalities   Modalities  Cryotherapy      Cryotherapy   Number Minutes Cryotherapy  10 Minutes    Cryotherapy Location  Knee   R   Type of Cryotherapy  Ice pack      Manual Therapy   Manual Therapy  Taping    Kinesiotex  Create Space      Kinesiotix   Create Space  R chondramalacia patellae pattern- 50% stretch on lateral strips, 80% stretch over patellar  tendon             PT Education - 11/07/18 1103    Education Details  edu on KT tape wear time, precautions, removal    Person(s) Educated  Patient    Methods  Explanation    Comprehension  Verbalized understanding       PT Short Term Goals - 10/31/18 1213      PT SHORT TERM GOAL #1   Title  Patient to be independent with initial HEP.    Time  4    Period  Weeks    Status  On-going        PT Long Term Goals - 09/28/18 1115      PT LONG TERM GOAL #1   Title  Patient to be independent with advanced HEP.    Time  8    Period  Weeks    Status  On-going      PT LONG TERM GOAL #2   Title  Patient to demonstrate R ankle AROM WFL and without pain limiting.     Time  8    Period  Weeks    Status  On-going      PT LONG TERM GOAL #3   Title  Patient to demonstrate R knee AROM WFL and without pain limiting.     Time  8    Period  Weeks    Status  On-going      PT LONG TERM GOAL #4   Title  Patient to demonstrate B LE strength >=4+/5.    Time  8    Period  Weeks    Status  On-going      PT LONG TERM GOAL #5   Title  Patient to report tolerance of 1 hour of standing/walking without pain limiting.     Time  8    Period  Weeks    Status  On-going            Plan - 11/07/18 1021    Clinical Impression Statement  Patient arrived to session with report of increased pain throughout body last night after having a busy day, however only R knee pain remaining this AM. Worked  on progressive LE strengthening this session, focusing on quad strength and TKE. Patient tolerated all activities without pain with exception of sitting knee flexion stretch, however this was mild, tolerable pain. Advised patient to avoid prolonged periods with pillow under R knee as this promotes flexed posturing. Patient received KT tape to R knee d/t persisting pain. Patient educated on taping wear time, precautions, and removal. Ended session with ice pack to R knee d/t pain and edema. No  complaints at end of session.     Clinical Impairments Affecting Rehab Potential  chronic rhinitis, osteopenia, HLD, arthropathy, B 5th toe bone removal, B bunionectomy     PT Treatment/Interventions  ADLs/Self Care Home Management;Cryotherapy;Electrical Stimulation;Iontophoresis 4mg /ml Dexamethasone;Functional mobility training;Stair training;Gait training;DME Instruction;Ultrasound;Moist Heat;Therapeutic activities;Therapeutic exercise;Balance training;Neuromuscular re-education;Patient/family education;Orthotic Fit/Training;Passive range of motion;Scar mobilization;Manual techniques;Dry needling;Energy conservation;Splinting;Taping;Vasopneumatic Device    PT Next Visit Plan  progress R LE strength and ROM    Consulted and Agree with Plan of Care  Patient       Patient will benefit from skilled therapeutic intervention in order to improve the following deficits and impairments:  Decreased range of motion, Difficulty walking, Decreased activity tolerance, Pain, Decreased balance, Decreased scar mobility, Hypomobility, Impaired flexibility, Improper body mechanics, Postural dysfunction, Increased edema, Decreased strength  Visit Diagnosis: Pain in right ankle and joints of right foot  Chronic pain of right knee  Stiffness of right knee, not elsewhere classified  Muscle weakness (generalized)  Difficulty in walking, not elsewhere classified  Localized edema     Problem List Patient Active Problem List   Diagnosis Date Noted  . Chest pain, musculoskeletal 06/14/2017  . Seasonal allergic conjunctivitis 07/13/2016  . Upper airway cough syndrome 06/29/2016  . Neck pain on left side 08/13/2015  . Other allergic rhinitis 05/23/2015  . Back pain with sciatica on L  08/18/2014  . Paresthesia of bilateral legs 06/11/2014  . Health care maintenance 12/17/2013  . Acute upper respiratory infection 01/14/2013  . Essential hypertension 08/03/2012  . Osteoporosis 12/24/2009  . COLONIC POLYPS,  HYPERPLASTIC 11/23/2007  . Hyperlipidemia 08/15/2007  . Morbid obesity ([place]) 08/15/2007  . RHINITIS, CHRONIC 08/15/2007  . Arthropathy 08/15/2007     [name], PT, DPT 11/07/18 11:04 AM   [facility] [address]  [place], [place], [address] Phone: [phone]   Fax:  [phone]  Name: [name] MRN: [id number] Date of Birth: 01-10-1931

## 2018-11-10 ENCOUNTER — Ambulatory Visit: Payer: Medicare Other

## 2018-11-10 DIAGNOSIS — M25661 Stiffness of right knee, not elsewhere classified: Secondary | ICD-10-CM

## 2018-11-10 DIAGNOSIS — R262 Difficulty in walking, not elsewhere classified: Secondary | ICD-10-CM

## 2018-11-10 DIAGNOSIS — M25561 Pain in right knee: Secondary | ICD-10-CM

## 2018-11-10 DIAGNOSIS — M25571 Pain in right ankle and joints of right foot: Secondary | ICD-10-CM | POA: Diagnosis not present

## 2018-11-10 DIAGNOSIS — M6281 Muscle weakness (generalized): Secondary | ICD-10-CM

## 2018-11-10 DIAGNOSIS — G8929 Other chronic pain: Secondary | ICD-10-CM

## 2018-11-10 DIAGNOSIS — R600 Localized edema: Secondary | ICD-10-CM

## 2018-11-10 NOTE — Therapy (Signed)
[facility] [address]  [place], [place], [address] Phone: [phone]   Fax:  [phone]  Physical Therapy Treatment  Patient Details  Name: [name] MRN: [id number] Date of Birth: May 30, 1931 Referring Provider (PT): [name], MD   Encounter Date: 11/10/2018  PT End of Session - 11/10/18 0910    Visit Number  7    Number of Visits  9    Date for PT Re-Evaluation  11/14/18    Authorization Type  [employer] Medicare    PT Start Time  0840    PT Stop Time  0945    PT Time Calculation (min)  65 min    Activity Tolerance  Patient tolerated treatment well    Behavior During Therapy  [facility] for tasks assessed/performed       Past Medical History:  Diagnosis Date  . Arthropathy, unspecified, site unspecified    Dr. [name]...right clavicular head swelling 2003  . Colon polyp    colonoscopy 06-24-06 with no adenomatous change, hyperplasia  only  . Environmental allergies   . Healthcare maintenance    CPX 12-24-09, Td 1/05, pneumovax 2000 age 9, GYN [name] ([facility])  . Hyperlipidemia    target less than 160 > try off muscle aches 03-18-10  . Morbid obesity ([place])    ideal = 142.  target wt = 158 for BMI < 30  . Osteopenia    BMD 08-07-08 tspin - 1.2, left femur - .4, right femur +.2.  repeat 01-02-10  1.9    0.1   0.5  . Rhinitis, chronic    Dr. [name]...on immunotherapy since 5/08    Past Surgical History:  Procedure Laterality Date  . BUNIONECTOMY  1982   bilateral  . FOOT SURGERY  1970   bilateral 5th toes, bones removed  . GANGLION CYST EXCISION  2000   3rd finger right hand  . TUBAL LIGATION  1971    There were no vitals filed for this visit.  Subjective Assessment - 11/10/18 0847    Subjective  Pt. noting she has not had time for HEP.      Pertinent History  chronic rhinitis, osteopenia, HLD, arthropathy, B 5th toe bone removal, B bunionectomy     Patient Stated Goals  "I would not have  any pain in my ankle"    Currently in Pain?  Yes    Pain Score  4    7/10 sharp pain at worse while walking and standing   Pain Location  Knee    Pain Orientation  Right    Pain Descriptors / Indicators  Sharp    Pain Type  Chronic pain    Pain Score  6    Pain Location  Back    Pain Orientation  Lower    Pain Descriptors / Indicators  Aching;Sharp    Pain Type  Acute pain    Pain Onset  More than a month ago    Pain Frequency  Constant    Pain Relieving Factors  shifting          [facility] PT Assessment - 11/10/18 0001      AROM   Right/Left Knee  Right    Right Knee Extension  5   pain   Right Knee Flexion  86   pain   Right/Left Ankle  --                   [facility] Adult  PT Treatment/Exercise - 11/10/18 0852      Self-Care   Self-Care  Other Self-Care Comments    Other Self-Care Comments   Consolidated existing HEP to focus on knee ROM and strengthening actvities as pt. having limited time due to caring for husband 24/7      Knee/Hip Exercises: Aerobic   Recumbent Bike  Lvl 1, 7 min - partial revolutions for ROM       Knee/Hip Exercises: Standing   Heel Raises  Both;10 reps;3 seconds;2 sets   2 sets    Heel Raises Limitations  toe/heel raise       Knee/Hip Exercises: Seated   Other Seated Knee/Hip Exercises  Seated R fitter hip extension (1 black band) + red TB TKE x 10 reps focusing on TKE with cueing    Hamstring Curl  Right;10 reps;Strengthening    Hamstring Limitations  red TB    Sit to Sand  10 reps;with UE support   push off from knees     Knee/Hip Exercises: Supine   Quad Sets  Right;15 reps;Strengthening    Quad Sets Limitations  5" x 15 reps + red TB TKE    Straight Leg Raises  Right;15 reps;Strengthening;1 set    Straight Leg Raises Limitations  Cues for quad set prior to each rep    Knee Flexion  Right;10 reps;AROM    Knee Flexion Limitations  heel resting on peanut p-ball       Knee/Hip Exercises: Prone   Prone Knee Hang  1 minute     Prone Knee Hang Limitations  light therapist overpressure into knee extension      Modalities   Modalities  Vasopneumatic      Vasopneumatic   Number Minutes Vasopneumatic   15 minutes    Vasopnuematic Location   Knee    Vasopneumatic Pressure  Low    Vasopneumatic Temperature   coldest temp.      Manual Therapy   Manual Therapy  Joint mobilization    Manual therapy comments  supine - pillow under knee     Joint Mobilization  R patellar mob all directions (limited)                PT Short Term Goals - 10/31/18 1213      PT SHORT TERM GOAL #1   Title  Patient to be independent with initial HEP.    Time  4    Period  Weeks    Status  On-going        PT Long Term Goals - 09/28/18 1115      PT LONG TERM GOAL #1   Title  Patient to be independent with advanced HEP.    Time  8    Period  Weeks    Status  On-going      PT LONG TERM GOAL #2   Title  Patient to demonstrate R ankle AROM WFL and without pain limiting.     Time  8    Period  Weeks    Status  On-going      PT LONG TERM GOAL #3   Title  Patient to demonstrate R knee AROM WFL and without pain limiting.     Time  8    Period  Weeks    Status  On-going      PT LONG TERM GOAL #4   Title  Patient to demonstrate B LE strength >=4+/5.    Time  8    Period  Weeks    Status  On-going      PT LONG TERM GOAL #5   Title  Patient to report tolerance of 1 hour of standing/walking without pain limiting.     Time  8    Period  Weeks    Status  On-going            Plan - 11/10/18 0944    Clinical Impression Statement  Pt. reporting R ankle continues to improve and she feels is not limiting her, "much" now.  R knee pain with walking and standing continues to be pt.'s primary complaint today.  Pt. reporting she is limited with HEP compliance due to time constraints while caring for husband 24/7.  HEP reviewed and consolidated to focus on R knee ROM/strengthening activities for hopeful improved adherence to  HEP.  Pt. R knee AROM measured at 5-86 dg today after performance of MT and ROM activities progressing toward LTG #3.  Session focusing on R knee ROM and strengthening activities which were well tolerated.  Ended visit mildly improved R knee pain and pt. requesting ice thus applied ice/compression to R knee for reduction in post-exercise swelling and pain.  Will continue to progress toward goals.      Rehab Potential  Good    Clinical Impairments Affecting Rehab Potential  chronic rhinitis, osteopenia, HLD, arthropathy, B 5th toe bone removal, B bunionectomy     PT Treatment/Interventions  ADLs/Self Care Home Management;Cryotherapy;Electrical Stimulation;Iontophoresis 4mg /ml Dexamethasone;Functional mobility training;Stair training;Gait training;DME Instruction;Ultrasound;Moist Heat;Therapeutic activities;Therapeutic exercise;Balance training;Neuromuscular re-education;Patient/family education;Orthotic Fit/Training;Passive range of motion;Scar mobilization;Manual techniques;Dry needling;Energy conservation;Splinting;Taping;Vasopneumatic Device    PT Next Visit Plan  progress R LE strength and ROM    Consulted and Agree with Plan of Care  Patient       Patient will benefit from skilled therapeutic intervention in order to improve the following deficits and impairments:  Decreased range of motion, Difficulty walking, Decreased activity tolerance, Pain, Decreased balance, Decreased scar mobility, Hypomobility, Impaired flexibility, Improper body mechanics, Postural dysfunction, Increased edema, Decreased strength  Visit Diagnosis: Pain in right ankle and joints of right foot  Chronic pain of right knee  Stiffness of right knee, not elsewhere classified  Muscle weakness (generalized)  Difficulty in walking, not elsewhere classified  Localized edema     Problem List Patient Active Problem List   Diagnosis Date Noted  . Chest pain, musculoskeletal 06/14/2017  . Seasonal allergic  conjunctivitis 07/13/2016  . Upper airway cough syndrome 06/29/2016  . Neck pain on left side 08/13/2015  . Other allergic rhinitis 05/23/2015  . Back pain with sciatica on L  08/18/2014  . Paresthesia of bilateral legs 06/11/2014  . Health care maintenance 12/17/2013  . Acute upper respiratory infection 01/14/2013  . Essential hypertension 08/03/2012  . Osteoporosis 12/24/2009  . COLONIC POLYPS, HYPERPLASTIC 11/23/2007  . Hyperlipidemia 08/15/2007  . Morbid obesity ([place]) 08/15/2007  . RHINITIS, CHRONIC 08/15/2007  . Arthropathy 08/15/2007    Bess Harvest, PTA 11/10/18 9:52 AM   [facility] [address]  [place], [place], [address] Phone: [phone]   Fax:  [phone]  Name: [name] MRN: [id number] Date of Birth: 1931-08-28

## 2018-11-14 ENCOUNTER — Encounter: Payer: Self-pay | Admitting: Physical Therapy

## 2018-11-14 ENCOUNTER — Ambulatory Visit: Payer: Medicare Other | Admitting: Physical Therapy

## 2018-11-14 DIAGNOSIS — R262 Difficulty in walking, not elsewhere classified: Secondary | ICD-10-CM

## 2018-11-14 DIAGNOSIS — M25561 Pain in right knee: Secondary | ICD-10-CM

## 2018-11-14 DIAGNOSIS — R600 Localized edema: Secondary | ICD-10-CM

## 2018-11-14 DIAGNOSIS — M25571 Pain in right ankle and joints of right foot: Secondary | ICD-10-CM

## 2018-11-14 DIAGNOSIS — M25661 Stiffness of right knee, not elsewhere classified: Secondary | ICD-10-CM

## 2018-11-14 DIAGNOSIS — M6281 Muscle weakness (generalized): Secondary | ICD-10-CM

## 2018-11-14 DIAGNOSIS — G8929 Other chronic pain: Secondary | ICD-10-CM

## 2018-11-14 NOTE — Therapy (Addendum)
[facility] [address]  [place], [place], [address] Phone: [phone]   Fax:  [phone]  Physical Therapy Treatment  Patient Details  Name: [name] MRN: [id number] Date of Birth: 1931-09-06 Referring Provider (PT): [name], MD    Encounter Date: 11/14/2018  PT End of Session - 11/14/18 1149    Visit Number  8    Number of Visits  14    Date for PT Re-Evaluation  12/26/18    Authorization Type  [employer] Medicare    PT Start Time  1017    PT Stop Time  1059    PT Time Calculation (min)  42 min    Activity Tolerance  Patient tolerated treatment well    Behavior During Therapy  [facility] for tasks assessed/performed       Past Medical History:  Diagnosis Date  . Arthropathy, unspecified, site unspecified    Dr. [name]...right clavicular head swelling 2003  . Colon polyp    colonoscopy 06-24-06 with no adenomatous change, hyperplasia  only  . Environmental allergies   . Healthcare maintenance    CPX 12-24-09, Td 1/05, pneumovax 2000 age 67, GYN [name] ([facility])  . Hyperlipidemia    target less than 160 > try off muscle aches 03-18-10  . Morbid obesity ([place])    ideal = 142.  target wt = 158 for BMI < 30  . Osteopenia    BMD 08-07-08 tspin - 1.2, left femur - .4, right femur +.2.  repeat 01-02-10  1.9    0.1   0.5  . Rhinitis, chronic    Dr. [name]...on immunotherapy since 5/08    Past Surgical History:  Procedure Laterality Date  . BUNIONECTOMY  1982   bilateral  . FOOT SURGERY  1970   bilateral 5th toes, bones removed  . GANGLION CYST EXCISION  2000   3rd finger right hand  . TUBAL LIGATION  1971    There were no vitals filed for this visit.  Subjective Assessment - 11/14/18 1018    Subjective  Patient reports that she would like to continue with PT for her knee. She believes that she needs ot focus on herself so that she can better help her husband who has dementia. Reports that she is  aware that she needs to be more diligent with HEP at home. Reports 40% improvement in R knee pain, 100% improvement in R ankle pain since starting PT. Would like to continue working on bending R knee, getting into car, walking/standing. Still having pain with WBing.     Pertinent History  chronic rhinitis, osteopenia, HLD, arthropathy, B 5th toe bone removal, B bunionectomy     Diagnostic tests  none recent    Patient Stated Goals  "I would not have any pain in my ankle"    Currently in Pain?  Yes    Pain Score  5     Pain Location  Knee    Pain Orientation  Right    Pain Descriptors / Indicators  Sharp    Pain Type  Chronic pain         [facility] PT Assessment - 11/14/18 0001      Assessment   Medical Diagnosis  Bunion of L foot    Referring Provider (PT)  [name], MD    Onset Date/Surgical Date  03/19/18      AROM   Right Knee Extension  9    Right Knee Flexion  84    Right Ankle Dorsiflexion  0    Right Ankle Plantar Flexion  50    Right Ankle Inversion  32    Right Ankle Eversion  20   mild pain over lateral malleolus     PROM   Right Knee Extension  9    Right Knee Flexion  86      Strength   Right Hip Flexion  4+/5   r knee pain   Right Hip ABduction  4/5   r knee pain   Right Hip ADduction  4/5    Left Hip Flexion  4+/5    Left Hip ABduction  4/5    Left Hip ADduction  4/5    Right Knee Flexion  4/5    Right Knee Extension  4/5    Left Knee Flexion  4/5    Left Knee Extension  4+/5    Right Ankle Dorsiflexion  4/5    Right Ankle Plantar Flexion  4/5    Right Ankle Inversion  4/5   pain   Right Ankle Eversion  4/5    Left Ankle Dorsiflexion  4+/5    Left Ankle Plantar Flexion  4+/5    Left Ankle Inversion  4+/5    Left Ankle Eversion  4+/5                   [facility] Adult PT Treatment/Exercise - 11/14/18 0001      Exercises   Exercises  Ankle      Knee/Hip Exercises: Aerobic   Nustep  Lvl 2, 6 min UE/LEs      [employer]   Create Space   R chondramalacia patellae pattern- 50% stretch on lateral strips, 80% stretch over patellar tendon      Ankle Exercises: Seated   Other Seated Ankle Exercises  R ankle DF, INV, EV with red TB x10 each             PT Education - 11/14/18 1148    Education Details  update to HEP, administered red TB, discussion on objective progress with PT thus far; reminder on KT tape precautions, wear time, removal    Person(s) Educated  Patient    Methods  Explanation;Demonstration;Tactile cues;Verbal cues;Handout    Comprehension  Verbalized understanding;Returned demonstration       PT Short Term Goals - 11/14/18 1026      PT SHORT TERM GOAL #1   Title  Patient to be independent with initial HEP.    Time  3    Period  Weeks    Status  On-going   reporting inconsistent compliance   Target Date  12/05/18        PT Long Term Goals - 11/14/18 1027      PT LONG TERM GOAL #1   Title  Patient to be independent with advanced HEP.    Time  6    Period  Weeks    Status  On-going   reports inconsistent compliance   Target Date  12/26/18      PT LONG TERM GOAL #2   Title  Patient to demonstrate R ankle AROM WFL and without pain limiting.     Time  6    Period  Weeks    Status  Partially Met   shown improvements in R knee flexion PROM    Target Date  12/26/18      PT LONG TERM GOAL #3   Title  Patient to demonstrate R  knee AROM WFL and without pain limiting.     Time  6    Period  Weeks    Status  Partially Met   shown improvements in R ankle plantarflexion AROM    Target Date  12/26/18      PT LONG TERM GOAL #4   Title  Patient to demonstrate B LE strength >=4+/5.    Time  6    Period  Weeks    Status  Partially Met   Has demonstrated improvements in R hip flexion, knee flexion, knee extension, and ankle dorsiflexion strength   Target Date  12/26/18      PT LONG TERM GOAL #5   Title  Patient to report tolerance of 1 hour of standing/walking without pain limiting.     Time   6    Period  Weeks    Status  On-going   reports <5 min before increase in R knee pain   Target Date  12/26/18            Plan - 11/14/18 1150    Clinical Impression Statement  Patient arrived to session with report of 40% improvement in R knee pain, 100% improvement in R ankle pain since starting PT. Would like to continue working on R knee flexion, getting into the car, standing, and walking. Patient has shown improvements in R knee flexion PROM and R ankle plantarflexion AROM. Has demonstrated improvements in R hip flexion, knee flexion, knee extension, and ankle dorsiflexion strength. Still showing deficits in hip abduction, hamstring, and ankle strength at this time. Updated HEP with resisted ankle strengthening to address remaining impairments- patient reported understanding. Ended session with tape to R knee as patient reporting benefits from this in previous sessions. Patient would benefit from continued skilled PT services 1x/week for 6 weeks to address remaining impairments.     Clinical Impairments Affecting Rehab Potential  chronic rhinitis, osteopenia, HLD, arthropathy, B 5th toe bone removal, B bunionectomy     PT Frequency  1x / week    PT Duration  6 weeks    PT Treatment/Interventions  ADLs/Self Care Home Management;Cryotherapy;Electrical Stimulation;Iontophoresis '4mg'$ /ml Dexamethasone;Functional mobility training;Stair training;Gait training;DME Instruction;Ultrasound;Moist Heat;Therapeutic activities;Therapeutic exercise;Balance training;Neuromuscular re-education;Patient/family education;Orthotic Fit/Training;Passive range of motion;Scar mobilization;Manual techniques;Dry needling;Energy conservation;Splinting;Taping;Vasopneumatic Device    PT Next Visit Plan  progress R LE strength and ROM    Consulted and Agree with Plan of Care  Patient       Patient will benefit from skilled therapeutic intervention in order to improve the following deficits and impairments:  Decreased  range of motion, Difficulty walking, Decreased activity tolerance, Pain, Decreased balance, Decreased scar mobility, Hypomobility, Impaired flexibility, Improper body mechanics, Postural dysfunction, Increased edema, Decreased strength  Visit Diagnosis: Pain in right ankle and joints of right foot  Chronic pain of right knee  Stiffness of right knee, not elsewhere classified  Muscle weakness (generalized)  Difficulty in walking, not elsewhere classified  Localized edema     Problem List Patient Active Problem List   Diagnosis Date Noted  . Chest pain, musculoskeletal 06/14/2017  . Seasonal allergic conjunctivitis 07/13/2016  . Upper airway cough syndrome 06/29/2016  . Neck pain on left side 08/13/2015  . Other allergic rhinitis 05/23/2015  . Back pain with sciatica on L  08/18/2014  . Paresthesia of bilateral legs 06/11/2014  . Health care maintenance 12/17/2013  . Acute upper respiratory infection 01/14/2013  . Essential hypertension 08/03/2012  . Osteoporosis 12/24/2009  . COLONIC POLYPS, HYPERPLASTIC 11/23/2007  .  Hyperlipidemia 08/15/2007  . Morbid obesity ([place]) 08/15/2007  . RHINITIS, CHRONIC 08/15/2007  . Arthropathy 08/15/2007     [name], PT, DPT 11/14/18 1:04 PM   [facility] [address]  [place], [place], [address] Phone: [phone]   Fax:  [phone]  Name: [name] MRN: [id number] Date of Birth: Nov 11, 1930

## 2018-11-22 ENCOUNTER — Ambulatory Visit: Payer: Medicare Other | Admitting: Physical Therapy

## 2018-11-28 ENCOUNTER — Encounter: Payer: Medicare Other | Admitting: Physical Therapy

## 2018-12-21 ENCOUNTER — Telehealth: Payer: Self-pay | Admitting: Physical Therapy

## 2018-12-21 NOTE — Telephone Encounter (Signed)
[name] was contacted today regarding transition from in-person OP Rehab Services to telehealth due to Covid-19. Pt consented to telehealth services, educated on MyChart signup, [employer], and was agreeable to receive information via (text/email) regarding telehealth services. Pt consented and agreeable to receive phone call for further scheduling.   [name], PT, DPT 12/21/18 12:27 PM

## 2018-12-26 ENCOUNTER — Other Ambulatory Visit: Payer: Self-pay

## 2018-12-26 ENCOUNTER — Ambulatory Visit: Payer: Medicare Other | Attending: Orthopedic Surgery | Admitting: Physical Therapy

## 2018-12-26 ENCOUNTER — Encounter: Payer: Self-pay | Admitting: Physical Therapy

## 2018-12-26 DIAGNOSIS — G8929 Other chronic pain: Secondary | ICD-10-CM

## 2018-12-26 DIAGNOSIS — M25571 Pain in right ankle and joints of right foot: Secondary | ICD-10-CM | POA: Diagnosis present

## 2018-12-26 DIAGNOSIS — M6281 Muscle weakness (generalized): Secondary | ICD-10-CM | POA: Diagnosis present

## 2018-12-26 DIAGNOSIS — M25661 Stiffness of right knee, not elsewhere classified: Secondary | ICD-10-CM

## 2018-12-26 DIAGNOSIS — M25561 Pain in right knee: Secondary | ICD-10-CM | POA: Insufficient documentation

## 2018-12-26 DIAGNOSIS — R262 Difficulty in walking, not elsewhere classified: Secondary | ICD-10-CM | POA: Diagnosis present

## 2018-12-26 DIAGNOSIS — R600 Localized edema: Secondary | ICD-10-CM | POA: Diagnosis present

## 2018-12-26 NOTE — Therapy (Signed)
[facility] [address]  [place], [place], [address] Phone: [phone]   Fax:  [phone]  Physical Therapy Progress Note  Patient Details  Name: [name] MRN: [id number] Date of Birth: 13-Mar-1931 Referring Provider (PT): [name], MD   PT Therapy Telehealth Visit:  I connected with [name] today at 10:22am by Webex video conference and verified that I am speaking with the correct person using two identifiers.  I discussed the limitations, risks, security and privacy concerns of performing an evaluation and management service by Webex and the availability of in person appointments.  I also discussed with the patient that there may be a patient responsible charge related to this service. The patient expressed understanding and agreed to proceed.    The patient's address was confirmed.  Identified to the patient that therapist is a licensed PT in the state of [place].  Verified phone # as [phone] to call in case of technical difficulties.    Progress Note Reporting Period 09/19/18 to 12/26/18  See note below for Objective Data and Assessment of Progress/Goals.    Encounter Date: 12/26/2018  PT End of Session - 12/26/18 1134    Visit Number  9    Number of Visits  15    Date for PT Re-Evaluation  02/06/19    Authorization Type  [employer]HC Medicare    PT Start Time  1022    PT Stop Time  1115   disconnected during telehealth session for several minutes   PT Time Calculation (min)  53 min    Activity Tolerance  Patient tolerated treatment well;Patient limited by pain    Behavior During Therapy  [facility] for tasks assessed/performed       Past Medical History:  Diagnosis Date  . Arthropathy, unspecified, site unspecified    Dr. [name]...right clavicular head swelling 2003  . Colon polyp    colonoscopy 06-24-06 with no adenomatous change, hyperplasia  only  . Environmental allergies   . Healthcare  maintenance    CPX 12-24-09, Td 1/05, pneumovax 2000 age 22, GYN [name] ([facility])  . Hyperlipidemia    target less than 160 > try off muscle aches 03-18-10  . Morbid obesity ([place])    ideal = 142.  target wt = 158 for BMI < 30  . Osteopenia    BMD 08-07-08 tspin - 1.2, left femur - .4, right femur +.2.  repeat 01-02-10  1.9    0.1   0.5  . Rhinitis, chronic    Dr. [name]...on immunotherapy since 5/08    Past Surgical History:  Procedure Laterality Date  . BUNIONECTOMY  1982   bilateral  . FOOT SURGERY  1970   bilateral 5th toes, bones removed  . GANGLION CYST EXCISION  2000   3rd finger right hand  . TUBAL LIGATION  1971    There were no vitals filed for this visit.  Subjective Assessment - 12/26/18 1122    Subjective  Reports that she has not been doing her HEP d/t husband getting sick and going to the hospital. Has had problems with her back on and off but wearing her back brace has been helping.     Pertinent History  chronic rhinitis, osteopenia, HLD, arthropathy, B 5th toe bone removal, B bunionectomy     Diagnostic tests  none recent    Patient Stated Goals  "I would not have any pain in my ankle"    Currently in  Pain?  Yes    Pain Score  2     Pain Location  Knee    Pain Orientation  Right    Pain Descriptors / Indicators  Sharp    Pain Type  Chronic pain         [facility] PT Assessment - 12/26/18 0001      Assessment   Medical Diagnosis  Bunion of L foot    Referring Provider (PT)  [name], MD    Onset Date/Surgical Date  03/19/18      Standardized Balance Assessment   Standardized Balance Assessment  Five Times Sit to Stand    Five times sit to stand comments   21.6 without UE use                   [facility] Adult PT Treatment/Exercise - 12/26/18 0001      Transfers   Transfers  Floor to Transfer    Floor to Transfer  6: Modified independent (Device/Increase time)    Comments  floor>stand and stand>floor transfer with verbal cues given for  foot and hand placement as well a sequencing      Knee/Hip Exercises: Stretches   Active Hamstring Stretch  Right;Left;1 rep;30 seconds    Active Hamstring Stretch Limitations  to tolerance      Knee/Hip Exercises: Supine   Bridges  Strengthening;Both;1 set;10 reps    Bridges Limitations  HS bridge   c/o R knee pain after 10 reps   Bridges with Clamshell  Strengthening;Both;1 set;10 reps   red TB above knees   Straight Leg Raises  Right;Strengthening;1 set;10 reps    Straight Leg Raises Limitations  cues to avoid lifting past opposite knee and for quad set      Knee/Hip Exercises: Sidelying   Hip ABduction  Strengthening;Right;Left;1 set;10 reps    Hip ABduction Limitations  verbal cues required for proper alignment and set-up             PT Education - 12/26/18 1132    Education Details  update to HEP which was emailed to patient; education on safe transfer technique to and from the floor    Person(s) Educated  Patient    Methods  Explanation;Demonstration;Tactile cues;Verbal cues;Handout    Comprehension  Verbalized understanding;Returned demonstration       PT Short Term Goals - 12/26/18 1135      PT SHORT TERM GOAL #1   Title  Patient to be independent with initial HEP.    Time  3    Period  Weeks    Status  On-going   12/26/18 reporting poor compliance d/t husband's poor health   Target Date  01/16/19        PT Long Term Goals - 12/26/18 1136      PT LONG TERM GOAL #1   Title  Patient to be independent with advanced HEP.    Time  6    Period  Weeks    Status  On-going   12/26/18: reporting poor compliance d/t husband's poor health   Target Date  02/06/19      PT LONG TERM GOAL #2   Title  Patient to demonstrate R ankle AROM WFL and without pain limiting.     Time  6    Period  Weeks    Status  Deferred   12/26/18: deferred d/t limitations of telehealth      PT LONG TERM GOAL #3   Title  Patient to demonstrate R  knee AROM WFL and without pain  limiting.     Time  6    Period  Weeks    Status  Deferred   12/26/18: deferred d/t limitations of telehealth; per patient- reports she is better able to straighter her R knee than before     PT LONG TERM GOAL #4   Title  Patient to demonstrate B LE strength >=4+/5.    Time  6    Period  Weeks    Status  Deferred   12/26/18: deferred d/t limitations of telehealth     PT LONG TERM GOAL #5   Title  Patient to report tolerance of 1 hour of standing/walking without pain limiting.     Time  6    Period  Weeks    Status  Partially Met   12/26/18: reports 37mn-1 hour before onset of pain   Target Date  02/06/19      Additional Long Term Goals   Additional Long Term Goals  Yes      PT LONG TERM GOAL #6   Title  Patient to score <= 14.8 seconds on 5xSTS without use of UEs in order to improve LE muscle strength and power.     Time  6    Period  Weeks    Status  New    Target Date  02/06/19      PT LONG TERM GOAL #7   Title  Patient to demonstrate safe floor to stand transfer independently and without cues.     Time  6    Period  Weeks    Status  New    Target Date  02/06/19            Plan - 12/26/18 1151    Clinical Impression Statement  Patient seen today via telehealth appointment. Patient reporting persisting R knee pain, worse with walking, sleeping, and end range flexion and extension. Patient admits to poor compliance with HEP d/t husband's poor health, but is eager to get back on track and requesting to continue with PT. Goal assessment today was limited by telehealth appointment, thus several goals were deferred. Assessed 5xSTS as a measure of LE strength and power- patient scored 21.6 seconds which indicates a limitation compared to age-matched norms. Patient reports 30 min-1 hour on her feet before onset of pain. Worked through LE strengthening ther-ex with good tolerance by patient, with exception of HS bridge which patient reported pain in R knee with. Educated  patient on proper floor to stand transfer with use of chair and verbal cues for hand/foot placement as patient struggling with this activity. Updated HEP with exercises that were well-tolerated today. Patient reported understanding. No complaints at end of session. Patient would benefit from skilled PT services 1x/week for 6 weeks to address aforementioned impairments and improve CLOF.      Clinical Impairments Affecting Rehab Potential  chronic rhinitis, osteopenia, HLD, arthropathy, B 5th toe bone removal, B bunionectomy     PT Frequency  1x / week    PT Duration  6 weeks    PT Treatment/Interventions  ADLs/Self Care Home Management;Cryotherapy;Electrical Stimulation;Iontophoresis 49mml Dexamethasone;Functional mobility training;Stair training;Gait training;DME Instruction;Ultrasound;Moist Heat;Therapeutic activities;Therapeutic exercise;Balance training;Neuromuscular re-education;Patient/family education;Orthotic Fit/Training;Passive range of motion;Scar mobilization;Manual techniques;Dry needling;Energy conservation;Splinting;Taping;Vasopneumatic Device    PT Next Visit Plan  progress R LE strength and ROM    Consulted and Agree with Plan of Care  Patient       Patient will benefit from skilled therapeutic intervention in order  to improve the following deficits and impairments:  Decreased range of motion, Difficulty walking, Decreased activity tolerance, Pain, Decreased balance, Decreased scar mobility, Hypomobility, Impaired flexibility, Improper body mechanics, Postural dysfunction, Increased edema, Decreased strength  Visit Diagnosis: Chronic pain of right knee  Stiffness of right knee, not elsewhere classified  Pain in right ankle and joints of right foot  Muscle weakness (generalized)  Difficulty in walking, not elsewhere classified  Localized edema     Problem List Patient Active Problem List   Diagnosis Date Noted  . Chest pain, musculoskeletal 06/14/2017  . Seasonal  allergic conjunctivitis 07/13/2016  . Upper airway cough syndrome 06/29/2016  . Neck pain on left side 08/13/2015  . Other allergic rhinitis 05/23/2015  . Back pain with sciatica on L  08/18/2014  . Paresthesia of bilateral legs 06/11/2014  . Health care maintenance 12/17/2013  . Acute upper respiratory infection 01/14/2013  . Essential hypertension 08/03/2012  . Osteoporosis 12/24/2009  . COLONIC POLYPS, HYPERPLASTIC 11/23/2007  . Hyperlipidemia 08/15/2007  . Morbid obesity ([place]) 08/15/2007  . RHINITIS, CHRONIC 08/15/2007  . Arthropathy 08/15/2007    [name], PT, DPT 12/26/18 2:59 PM   [facility] [address]  [place], [place], [address] Phone: [phone]   Fax:  [phone]  Name: TAIMI TOWE MRN: [id number] Date of Birth: [birth year]

## 2018-12-26 NOTE — Patient Instructions (Signed)
Access Code: [email or web address]  URL: [email or web address]  Date: 12/26/2018  Prepared by: Grayling Congress   Exercises  Supine Hamstring Stretch - 2 reps - 2 sets - 30 hold - 2x daily - 7x weekly  Sidelying Hip Abduction - 10 reps - 2 sets - 2x daily - 7x weekly  Supine Bridge with Resistance Band - 10 reps - 2 sets - 2x daily - 7x weekly

## 2019-01-02 ENCOUNTER — Ambulatory Visit: Payer: Medicare Other | Admitting: Physical Therapy

## 2019-01-02 ENCOUNTER — Other Ambulatory Visit: Payer: Self-pay

## 2019-01-02 ENCOUNTER — Encounter: Payer: Self-pay | Admitting: Physical Therapy

## 2019-01-02 DIAGNOSIS — M25561 Pain in right knee: Secondary | ICD-10-CM | POA: Diagnosis not present

## 2019-01-02 DIAGNOSIS — M6281 Muscle weakness (generalized): Secondary | ICD-10-CM

## 2019-01-02 DIAGNOSIS — R262 Difficulty in walking, not elsewhere classified: Secondary | ICD-10-CM

## 2019-01-02 DIAGNOSIS — M25661 Stiffness of right knee, not elsewhere classified: Secondary | ICD-10-CM

## 2019-01-02 DIAGNOSIS — M25571 Pain in right ankle and joints of right foot: Secondary | ICD-10-CM

## 2019-01-02 DIAGNOSIS — R600 Localized edema: Secondary | ICD-10-CM

## 2019-01-02 DIAGNOSIS — G8929 Other chronic pain: Secondary | ICD-10-CM

## 2019-01-02 NOTE — Therapy (Signed)
[facility] [address]  [place], [place], [address] Phone: [phone]   Fax:  [phone]  Physical Therapy Treatment  Patient Details  Name: [name] MRN: [id number] Date of Birth: July 14, 1931 Referring Provider (PT): [name], MD    PT Therapy Telehealth Visit:  I connected with [name] today at 11:01 am by [facility] video conference and verified that I am speaking with the correct person using two identifiers.  I discussed the limitations, risks, security and privacy concerns of performing an evaluation and management service by Webex and the availability of in person appointments.  I also discussed with the patient that there may be a patient responsible charge related to this service. The patient expressed understanding and agreed to proceed.    The patient's address was confirmed.  Identified to the patient that therapist is a licensed PT in the state of [place].  Verified phone # as [phone] to call in case of technical difficulties.   Encounter Date: 01/02/2019  PT End of Session - 01/02/19 1159    Visit Number  10    Number of Visits  15    Date for PT Re-Evaluation  02/06/19    Authorization Type  UHC Medicare    PT Start Time  1101    PT Stop Time  1151    PT Time Calculation (min)  50 min    Activity Tolerance  Patient tolerated treatment well;Patient limited by pain    Behavior During Therapy  WFL for tasks assessed/performed       Past Medical History:  Diagnosis Date  . Arthropathy, unspecified, site unspecified    Dr. [name]...right clavicular head swelling 2003  . Colon polyp    colonoscopy 06-24-06 with no adenomatous change, hyperplasia  only  . Environmental allergies   . Healthcare maintenance    CPX 12-24-09, Td 1/05, pneumovax 2000 age 85, GYN [name] ([facility])  . Hyperlipidemia    target less than 160 > try off muscle aches 03-18-10  . Morbid obesity ([place])    ideal  = 142.  target wt = 158 for BMI < 30  . Osteopenia    BMD 08-07-08 tspin - 1.2, left femur - .4, right femur +.2.  repeat 01-02-10  1.9    0.1   0.5  . Rhinitis, chronic    Dr. [name]...on immunotherapy since 5/08    Past Surgical History:  Procedure Laterality Date  . BUNIONECTOMY  1982   bilateral  . FOOT SURGERY  1970   bilateral 5th toes, bones removed  . GANGLION CYST EXCISION  2000   3rd finger right hand  . TUBAL LIGATION  1971    There were no vitals filed for this visit.  Subjective Assessment - 01/02/19 1154    Subjective  Reports that her husband is in the hospital again, thus she has not been doing her HEP. Her daughter is possibly going to take her of him once he is out of the hospital, which will let Florabel have more time for herself.     Pertinent History  chronic rhinitis, osteopenia, HLD, arthropathy, B 5th toe bone removal, B bunionectomy     Diagnostic tests  none recent    Patient Stated Goals  "I would not have any pain in my ankle"    Currently in Pain?  Yes    Pain Score  3     Pain Location  Knee  Pain Orientation  Right;Anterior    Pain Descriptors / Indicators  Sharp    Pain Type  Chronic pain                       [facility] Adult PT Treatment/Exercise - 01/02/19 0001      Knee/Hip Exercises: Standing   Hip Abduction  Stengthening;Right;Left;1 set;10 reps;Knee straight    Abduction Limitations  standing at chair      Knee/Hip Exercises: Sidelying   Clams  x15 each side   cues to maintain hips rolled forward     Ankle Exercises: Seated   Other Seated Ankle Exercises  R ankle DF, INV, EV with red TB x15 each   heavy verbal and visual cues for set up of TB at home     Ankle Exercises: Standing   Heel Raises  Right;10 reps   2x10 B conc/R ecc heel raise at chair     Ankle Exercises: Stretches   Gastroc Stretch  1 rep;30 seconds   at chair            PT Education - 01/02/19 1207    Education Details  discussion  and review of previously administered HEP    Person(s) Educated  Patient    Methods  Explanation    Comprehension  Verbalized understanding       PT Short Term Goals - 12/26/18 1135      PT SHORT TERM GOAL #1   Title  Patient to be independent with initial HEP.    Time  3    Period  Weeks    Status  On-going   12/26/18 reporting poor compliance d/t husband's poor health   Target Date  01/16/19        PT Long Term Goals - 01/02/19 1206      PT LONG TERM GOAL #1   Title  Patient to be independent with advanced HEP.    Time  6    Period  Weeks    Status  On-going   12/26/18: reporting poor compliance d/t husband's poor health     PT LONG TERM GOAL #2   Title  Patient to demonstrate R ankle AROM WFL and without pain limiting.     Time  6    Period  Weeks    Status  Deferred   12/26/18: deferred d/t limitations of telehealth      PT LONG TERM GOAL #3   Title  Patient to demonstrate R knee AROM WFL and without pain limiting.     Time  6    Period  Weeks    Status  Deferred   12/26/18: deferred d/t limitations of telehealth; per patient- reports she is better able to straighter her R knee than before     PT LONG TERM GOAL #4   Title  Patient to demonstrate B LE strength >=4+/5.    Time  6    Period  Weeks    Status  Deferred   12/26/18: deferred d/t limitations of telehealth     PT LONG TERM GOAL #5   Title  Patient to report tolerance of 1 hour of standing/walking without pain limiting.     Time  6    Period  Weeks    Status  Partially Met   12/26/18: reports 33mn-1 hour before onset of pain     PT LONG TERM GOAL #6   Title  Patient to score <= 14.8 seconds on 5xSTS  without use of UEs in order to improve LE muscle strength and power.     Time  6    Period  Weeks    Status  On-going      PT LONG TERM GOAL #7   Title  Patient to demonstrate safe floor to stand transfer independently and without cues.     Time  6    Period  Weeks    Status  On-going             Plan - 01/02/19 1200    Clinical Impression Statement  Patient seen today via telehealth appointment. Patient admitting to HEP noncompliance as her husband has been back in the hospital and she is his primary caregiver. Mentions that her daughter may be taking over more of these responsibilities, possibly allowing patient to have more time to focus on her health. Worked on reviewing ankle strengthening ther-ex this session with heavy verbal and visual cues for proper set up at home. Introduced progression of ankle strengthening with heel raises- patient reporting R knee pain with full weight shift over R LE. Better tolerance with toe touch on L LE. Demonstrated good form and control with standing hip abduction, but again with c/o pain in R knee when fully shifting weight to R. Plan to focus more on supine and sitting ther-ex to avoid R knee pain in future session. Advised patient to increase compliance with HEP until next session- patient reported understanding and with no complaints at end of session.     Clinical Impairments Affecting Rehab Potential  chronic rhinitis, osteopenia, HLD, arthropathy, B 5th toe bone removal, B bunionectomy     PT Treatment/Interventions  ADLs/Self Care Home Management;Cryotherapy;Electrical Stimulation;Iontophoresis '4mg'$ /ml Dexamethasone;Functional mobility training;Stair training;Gait training;DME Instruction;Ultrasound;Moist Heat;Therapeutic activities;Therapeutic exercise;Balance training;Neuromuscular re-education;Patient/family education;Orthotic Fit/Training;Passive range of motion;Scar mobilization;Manual techniques;Dry needling;Energy conservation;Splinting;Taping;Vasopneumatic Device    PT Next Visit Plan  progress R LE strength and ROM    Consulted and Agree with Plan of Care  Patient       Patient will benefit from skilled therapeutic intervention in order to improve the following deficits and impairments:  Decreased range of motion, Difficulty  walking, Decreased activity tolerance, Pain, Decreased balance, Decreased scar mobility, Hypomobility, Impaired flexibility, Improper body mechanics, Postural dysfunction, Increased edema, Decreased strength  Visit Diagnosis: Chronic pain of right knee  Stiffness of right knee, not elsewhere classified  Pain in right ankle and joints of right foot  Muscle weakness (generalized)  Difficulty in walking, not elsewhere classified  Localized edema     Problem List Patient Active Problem List   Diagnosis Date Noted  . Chest pain, musculoskeletal 06/14/2017  . Seasonal allergic conjunctivitis 07/13/2016  . Upper airway cough syndrome 06/29/2016  . Neck pain on left side 08/13/2015  . Other allergic rhinitis 05/23/2015  . Back pain with sciatica on L  08/18/2014  . Paresthesia of bilateral legs 06/11/2014  . Health care maintenance 12/17/2013  . Acute upper respiratory infection 01/14/2013  . Essential hypertension 08/03/2012  . Osteoporosis 12/24/2009  . COLONIC POLYPS, HYPERPLASTIC 11/23/2007  . Hyperlipidemia 08/15/2007  . Morbid obesity ([place]) 08/15/2007  . RHINITIS, CHRONIC 08/15/2007  . Arthropathy 08/15/2007     [name], PT, DPT 01/02/19 12:11 PM   [facility] [address]  [place], [place], [address] Phone: [phone]   Fax:  [phone]  Name: EMBERLYN BURLISON MRN: [id number] Date of Birth: Jan 06, 1931

## 2019-01-09 ENCOUNTER — Other Ambulatory Visit: Payer: Self-pay

## 2019-01-09 ENCOUNTER — Encounter: Payer: Self-pay | Admitting: Physical Therapy

## 2019-01-09 ENCOUNTER — Ambulatory Visit: Payer: Medicare Other | Attending: Orthopedic Surgery | Admitting: Physical Therapy

## 2019-01-09 DIAGNOSIS — G8929 Other chronic pain: Secondary | ICD-10-CM | POA: Diagnosis present

## 2019-01-09 DIAGNOSIS — R600 Localized edema: Secondary | ICD-10-CM | POA: Diagnosis present

## 2019-01-09 DIAGNOSIS — M25571 Pain in right ankle and joints of right foot: Secondary | ICD-10-CM | POA: Diagnosis present

## 2019-01-09 DIAGNOSIS — M6281 Muscle weakness (generalized): Secondary | ICD-10-CM | POA: Diagnosis present

## 2019-01-09 DIAGNOSIS — M25561 Pain in right knee: Secondary | ICD-10-CM | POA: Diagnosis not present

## 2019-01-09 DIAGNOSIS — R262 Difficulty in walking, not elsewhere classified: Secondary | ICD-10-CM | POA: Insufficient documentation

## 2019-01-09 DIAGNOSIS — M25661 Stiffness of right knee, not elsewhere classified: Secondary | ICD-10-CM | POA: Insufficient documentation

## 2019-01-09 NOTE — Therapy (Signed)
[name] [address]  [place], [place], [address] Phone: [phone]   Fax:  [phone]  Physical Therapy Treatment  Patient Details  Name: [name] MRN: [id number] Date of Birth: [birth year] Referring Provider (PT): [name], MD   PT Therapy Telehealth Visit:  I connected with [name] today at 10:51 am by [facility] video conference and verified that I am speaking with the correct person using two identifiers.  I discussed the limitations, risks, security and privacy concerns of performing an evaluation and management service by Webex and the availability of in person appointments.  I also discussed with the patient that there may be a patient responsible charge related to this service. The patient expressed understanding and agreed to proceed.    The patient's address was confirmed.  Identified to the patient that therapist is a licensed PT in the state of [place].  Verified phone # as [phone] to call in case of technical difficulties.    Encounter Date: 01/09/2019  PT End of Session - 01/09/19 1146    Visit Number  11    Number of Visits  15    Date for PT Re-Evaluation  02/06/19    Authorization Type  UHC Medicare    PT Start Time  1051    PT Stop Time  1141    PT Time Calculation (min)  50 min    Activity Tolerance  Patient tolerated treatment well    Behavior During Therapy  WFL for tasks assessed/performed       Past Medical History:  Diagnosis Date  . Arthropathy, unspecified, site unspecified    Dr. [name]...right clavicular head swelling 2003  . Colon polyp    colonoscopy 06-24-06 with no adenomatous change, hyperplasia  only  . Environmental allergies   . Healthcare maintenance    CPX 12-24-09, Td 1/05, pneumovax 2000 age 65, GYN [name] ([name])  . Hyperlipidemia    target less than 160 > try off muscle aches 03-18-10  . Morbid obesity ([place])    ideal = 142.  target wt = 158  for BMI < 30  . Osteopenia    BMD 08-07-08 tspin - 1.2, left femur - .4, right femur +.2.  repeat 01-02-10  1.9    0.1   0.5  . Rhinitis, chronic    Dr. [name]...on immunotherapy since 5/08    Past Surgical History:  Procedure Laterality Date  . BUNIONECTOMY  1982   bilateral  . FOOT SURGERY  1970   bilateral 5th toes, bones removed  . GANGLION CYST EXCISION  2000   3rd finger right hand  . TUBAL LIGATION  1971    There were no vitals filed for this visit.  Subjective Assessment - 01/09/19 1143    Subjective  Reports that she has not been doing her HEP- has been overwhelmed with tasks around the house. [place]ans is in rehab and will be coming home soon. Her knee is feeling better and has not been taking meds for it, but has been using ice and heat.     Pertinent History  chronic rhinitis, osteopenia, HLD, arthropathy, B 5th toe bone removal, B bunionectomy     Diagnostic tests  none recent    Patient Stated Goals  "I would not have any pain in my ankle"    Currently in Pain?  Yes    Pain Score  3     Pain Location  Knee  Pain Orientation  Right;Anterior    Pain Descriptors / Indicators  Sharp    Pain Type  Chronic pain    Pain Score  1    Pain Location  Knee    Pain Orientation  Left    Pain Descriptors / Indicators  Aching    Pain Type  Acute pain                       [facility] Adult PT Treatment/Exercise - 01/09/19 0001      Knee/Hip Exercises: Stretches   Active Hamstring Stretch  Right;Left;1 rep;30 seconds    Active Hamstring Stretch Limitations  to tolerance      Knee/Hip Exercises: Seated   Sit to Sand  1 set;5 reps;without UE support   sitting on 1 pillow; cues for set up     Knee/Hip Exercises: Supine   Bridges  Strengthening;Both;1 set;15 reps    Bridges Limitations  good control    Straight Leg Raises  Right;Strengthening;1 set;10 reps    Straight Leg Raises Limitations  cues for quad set    Straight Leg Raise with External Rotation   Strengthening;Right;1 set;10 reps    Straight Leg Raise with External Rotation Limitations  cues for quad set      Knee/Hip Exercises: Sidelying   Hip ABduction  Strengthening;Right;Left;1 set;10 reps    Hip ABduction Limitations  verbal cues required for proper alignment and set-up as well as TKE    Hip ADduction  Strengthening;Right;Left;1 set;10 reps    Hip ADduction Limitations  opposite LE propped on cushion   cues for TKE   Clams  x10 each side with red TB above knees   good form            PT Education - 01/09/19 1144    Education Details  verbal review of previous HEP; advised patient to try taking short walks in or around her home to avoid muscle loss during self-quarantine     Person(s) Educated  Patient    Methods  Explanation    Comprehension  Verbalized understanding       PT Short Term Goals - 12/26/18 1135      PT SHORT TERM GOAL #1   Title  Patient to be independent with initial HEP.    Time  3    Period  Weeks    Status  On-going   12/26/18 reporting poor compliance d/t husband's poor health   Target Date  01/16/19        PT Long Term Goals - 01/02/19 1206      PT LONG TERM GOAL #1   Title  Patient to be independent with advanced HEP.    Time  6    Period  Weeks    Status  On-going   12/26/18: reporting poor compliance d/t husband's poor health     PT LONG TERM GOAL #2   Title  Patient to demonstrate R ankle AROM WFL and without pain limiting.     Time  6    Period  Weeks    Status  Deferred   12/26/18: deferred d/t limitations of telehealth      PT LONG TERM GOAL #3   Title  Patient to demonstrate R knee AROM WFL and without pain limiting.     Time  6    Period  Weeks    Status  Deferred   12/26/18: deferred d/t limitations of telehealth; per patient- reports she is better  able to straighter her R knee than before     PT LONG TERM GOAL #4   Title  Patient to demonstrate B LE strength >=4+/5.    Time  6    Period  Weeks    Status   Deferred   12/26/18: deferred d/t limitations of telehealth     PT LONG TERM GOAL #5   Title  Patient to report tolerance of 1 hour of standing/walking without pain limiting.     Time  6    Period  Weeks    Status  Partially Met   12/26/18: reports 59mn-1 hour before onset of pain     PT LONG TERM GOAL #6   Title  Patient to score <= 14.8 seconds on 5xSTS without use of UEs in order to improve LE muscle strength and power.     Time  6    Period  Weeks    Status  On-going      PT LONG TERM GOAL #7   Title  Patient to demonstrate safe floor to stand transfer independently and without cues.     Time  6    Period  Weeks    Status  On-going            Plan - 01/09/19 1151    Clinical Impression Statement  Patient seen via telehealth appointment. Reported poor compliance with HEP, however noting the R knee has been feeling better since last session. Patient currently under self-quarantine and avoiding leaving the house. Advised patient to try taking short walks in or around her home to avoid muscle loss during self-quarantine. Patient agreeable. Worked on supine, sidelying, and sitting ther-ex this session as patient previously reporting increased R knee pain with standing exercises. Cues given for TKE, particularly on R LE with all straight leg activities to improve quad activation. Patient demonstrating some difficulty with sit to stands- better tolerance while sitting on pillow for slight elevation. Cues provided to bring feet to shoulder width, scoot forward in seat, and lean forward before standing up. Patient reporting no complaints at end of session, and reporting that she would try to improve HEP compliance before next session.     Clinical Impairments Affecting Rehab Potential  chronic rhinitis, osteopenia, HLD, arthropathy, B 5th toe bone removal, B bunionectomy     PT Treatment/Interventions  ADLs/Self Care Home Management;Cryotherapy;Electrical Stimulation;Iontophoresis '4mg'$ /ml  Dexamethasone;Functional mobility training;[name] training;Gait training;DME Instruction;Ultrasound;Moist Heat;Therapeutic activities;Therapeutic exercise;Balance training;Neuromuscular re-education;Patient/family education;Orthotic Fit/Training;Passive range of motion;Scar mobilization;Manual techniques;Dry needling;Energy conservation;Splinting;Taping;Vasopneumatic Device    PT Next Visit Plan  progress R LE strength and ROM    Consulted and Agree with Plan of Care  Patient       Patient will benefit from skilled therapeutic intervention in order to improve the following deficits and impairments:  Decreased range of motion, Difficulty walking, Decreased activity tolerance, Pain, Decreased balance, Decreased scar mobility, Hypomobility, Impaired flexibility, Improper body mechanics, Postural dysfunction, Increased edema, Decreased strength  Visit Diagnosis: Chronic pain of right knee  Stiffness of right knee, not elsewhere classified  Pain in right ankle and joints of right foot  Muscle weakness (generalized)  Difficulty in walking, not elsewhere classified  Localized edema     Problem List Patient Active Problem List   Diagnosis Date Noted  . Chest pain, musculoskeletal 06/14/2017  . Seasonal allergic conjunctivitis 07/13/2016  . Upper airway cough syndrome 06/29/2016  . Neck pain on left side 08/13/2015  . Other allergic rhinitis 05/23/2015  . Back pain with  sciatica on L  08/18/2014  . Paresthesia of bilateral legs 06/11/2014  . Health care maintenance 12/17/2013  . Acute upper respiratory infection 01/14/2013  . Essential hypertension 08/03/2012  . Osteoporosis 12/24/2009  . COLONIC POLYPS, HYPERPLASTIC 11/23/2007  . Hyperlipidemia 08/15/2007  . Morbid obesity ([place]) 08/15/2007  . RHINITIS, CHRONIC 08/15/2007  . Arthropathy 08/15/2007    [name], PT, DPT 01/09/19 11:57 AM    [facility] [address]  [place], [place], [address] Phone: [phone]   Fax:  [phone]  Name: [name] MRN: [id number] Date of Birth: [birth year]

## 2019-01-16 ENCOUNTER — Other Ambulatory Visit: Payer: Self-pay

## 2019-01-16 ENCOUNTER — Encounter: Payer: Self-pay | Admitting: Physical Therapy

## 2019-01-16 ENCOUNTER — Ambulatory Visit: Payer: Medicare Other | Admitting: Physical Therapy

## 2019-01-16 DIAGNOSIS — M25571 Pain in right ankle and joints of right foot: Secondary | ICD-10-CM

## 2019-01-16 DIAGNOSIS — R262 Difficulty in walking, not elsewhere classified: Secondary | ICD-10-CM

## 2019-01-16 DIAGNOSIS — M25561 Pain in right knee: Secondary | ICD-10-CM | POA: Diagnosis not present

## 2019-01-16 DIAGNOSIS — R600 Localized edema: Secondary | ICD-10-CM

## 2019-01-16 DIAGNOSIS — M25661 Stiffness of right knee, not elsewhere classified: Secondary | ICD-10-CM

## 2019-01-16 DIAGNOSIS — M6281 Muscle weakness (generalized): Secondary | ICD-10-CM

## 2019-01-16 DIAGNOSIS — G8929 Other chronic pain: Secondary | ICD-10-CM

## 2019-01-16 NOTE — Patient Instructions (Addendum)
     Access Code: [email or web address]  URL: [email or web address]  Date: 01/16/2019  Prepared by: Grayling Congress   Exercises  Sit to Stand without Arm Support - 5 reps - 2 sets - 2x daily - 7x weekly

## 2019-01-16 NOTE — Therapy (Signed)
[facility] [address]  [place], [place], [address] Phone: [phone]   Fax:  [phone]  Physical Therapy Treatment  Patient Details  Name: LILLYANN AHART MRN: [id number] Date of Birth: 09/23/1930 Referring Provider (PT): [name], MD   PT Therapy Telehealth Visit:  I connected with Jasemine Nawaz today at 11:03 am by [facility] video conference and verified that I am speaking with the correct person using two identifiers.  I discussed the limitations, risks, security and privacy concerns of performing an evaluation and management service by Webex and the availability of in person appointments.  I also discussed with the patient that there may be a patient responsible charge related to this service. The patient expressed understanding and agreed to proceed.    The patient's address was confirmed.  Identified to the patient that therapist is a licensed [place] in the state of .  Verified phone # as [phone] to call in case of technical difficulties.    Encounter Date: 01/16/2019  PT End of Session - 01/16/19 1237    Visit Number  12    Number of Visits  15    Date for PT Re-Evaluation  02/06/19    Authorization Type  UHC Medicare    PT Start Time  1103    PT Stop Time  1152    PT Time Calculation (min)  49 min    Activity Tolerance  Patient tolerated treatment well    Behavior During Therapy  WFL for tasks assessed/performed       Past Medical History:  Diagnosis Date  . Arthropathy, unspecified, site unspecified    Dr. [name]...right clavicular head swelling 2003  . Colon polyp    colonoscopy 06-24-06 with no adenomatous change, hyperplasia  only  . Environmental allergies   . Healthcare maintenance    CPX 12-24-09, Td 1/05, pneumovax 2000 age 8, GYN [name] ([place])  . Hyperlipidemia    target less than 160 > try off muscle aches 03-18-10  . Morbid obesity ([place])    ideal = 142.  target wt = 158  for BMI < 30  . Osteopenia    BMD 08-07-08 tspin - 1.2, left femur - .4, right femur +.2.  repeat 01-02-10  1.9    0.1   0.5  . Rhinitis, chronic    Dr. [name]...on immunotherapy since 5/08    Past Surgical History:  Procedure Laterality Date  . BUNIONECTOMY  1982   bilateral  . FOOT SURGERY  1970   bilateral 5th toes, bones removed  . GANGLION CYST EXCISION  2000   3rd finger right hand  . TUBAL LIGATION  1971    There were no vitals filed for this visit.  Subjective Assessment - 01/16/19 1226    Subjective  Reports that she has been feeling better. "I plead the fifth" on her HEP. Her husband is home from the hospital and has been trying to take care of him.     Pertinent History  chronic rhinitis, osteopenia, HLD, arthropathy, B 5th toe bone removal, B bunionectomy     Diagnostic tests  none recent    Patient Stated Goals  "I would not have any pain in my ankle"    Currently in Pain?  Yes    Pain Score  3     Pain Location  Knee    Pain Orientation  Right;Anterior    Pain Descriptors / Indicators  Hervey Ard  Pain Type  Chronic pain         [facility] PT Assessment - 01/16/19 0001      Standardized Balance Assessment   Standardized Balance Assessment  Five Times Sit to Stand    Five times sit to stand comments   26.5 without UE use                   [facility] Adult PT Treatment/Exercise - 01/16/19 0001      Knee/Hip Exercises: Stretches   Active Hamstring Stretch  Right;Left;1 rep;30 seconds    Active Hamstring Stretch Limitations  to tolerance      Knee/Hip Exercises: Seated   Sit to Sand  5 reps;without UE support;2 sets   cues for set up     Knee/Hip Exercises: Supine   Quad Sets  Strengthening;Right;1 set;10 reps    Quad Sets Limitations  10x10" with towel roll     Bridges with [employer]  Strengthening;Both;1 set;10 reps   pillow squeeze   Bridges with Clamshell  Strengthening;Both;1 set;10 reps   with green TB above knees     Knee/Hip  Exercises: Sidelying   Clams  x10 each side with green TB above knees   cues to avoid trunk rotation     Knee/Hip Exercises: Prone   Hip Extension  Strengthening;Right;Left;1 set;10 reps    Hip Extension Limitations  heavy cues to maintain straight knee             PT Education - 01/16/19 1202    Education Details  update to HEP; edu on integrating HEP into daily activities to improve compliance; assisted patient in accessing her online HEP via [place]     Person(s) Educated  Patient    Methods  Explanation;Demonstration;Tactile cues;Verbal cues;Handout    Comprehension  Verbalized understanding;Returned demonstration       PT Short Term Goals - 12/26/18 1135      PT SHORT TERM GOAL #1   Title  Patient to be independent with initial HEP.    Time  3    Period  Weeks    Status  On-going   12/26/18 reporting poor compliance d/t husband's poor health   Target Date  01/16/19        PT Long Term Goals - 01/02/19 1206      PT LONG TERM GOAL #1   Title  Patient to be independent with advanced HEP.    Time  6    Period  Weeks    Status  On-going   12/26/18: reporting poor compliance d/t husband's poor health     PT LONG TERM GOAL #2   Title  Patient to demonstrate R ankle AROM WFL and without pain limiting.     Time  6    Period  Weeks    Status  Deferred   12/26/18: deferred d/t limitations of telehealth      PT LONG TERM GOAL #3   Title  Patient to demonstrate R knee AROM WFL and without pain limiting.     Time  6    Period  Weeks    Status  Deferred   12/26/18: deferred d/t limitations of telehealth; per patient- reports she is better able to straighter her R knee than before     PT LONG TERM GOAL #4   Title  Patient to demonstrate B LE strength >=4+/5.    Time  6    Period  Weeks    Status  Deferred  12/26/18: deferred d/t limitations of telehealth     PT LONG TERM GOAL #5   Title  Patient to report tolerance of 1 hour of standing/walking without pain  limiting.     Time  6    Period  Weeks    Status  Partially Met   12/26/18: reports 1mn-1 hour before onset of pain     PT LONG TERM GOAL #6   Title  Patient to score <= 14.8 seconds on 5xSTS without use of UEs in order to improve LE muscle strength and power.     Time  6    Period  Weeks    Status  On-going      PT LONG TERM GOAL #7   Title  Patient to demonstrate safe floor to stand transfer independently and without cues.     Time  6    Period  Weeks    Status  On-going            Plan - 01/16/19 1238    Clinical Impression Statement  Patient seen today via telehealth appointment. Patient still reporting noncompliance with HEP d/t having her husband back from the hospital. Worked on progressive LE ther-ex this session with intermittent cues and demonstration required for proper form. Progressed glute strengthening with bridges with abduction and adduction challenges. Able to tolerate increased banded resistance with bridge with TB, as well as with clamshells. Introduced prone hip extension with heavy cues required to maintain straight knee; patient reporting difficulty but no pain. Ended session with practice performing STS with cues for proper set up- patient able to demonstrate excellent form and eccentric control without use of pillow to elevate seat with no c/o pain. Ended session with education on incorporating HEP into daily routine for improved compliance and added STS to patient's HEP- patient reported understanding. No complaints at end of session. Increased time required during appointment d/t technical difficulties.     Clinical Impairments Affecting Rehab Potential  chronic rhinitis, osteopenia, HLD, arthropathy, B 5th toe bone removal, B bunionectomy     PT Treatment/Interventions  ADLs/Self Care Home Management;Cryotherapy;Electrical Stimulation;Iontophoresis '4mg'$ /ml Dexamethasone;Functional mobility training;Stair training;Gait training;DME Instruction;Ultrasound;Moist  Heat;Therapeutic activities;Therapeutic exercise;Balance training;Neuromuscular re-education;Patient/family education;Orthotic Fit/Training;Passive range of motion;Scar mobilization;Manual techniques;Dry needling;Energy conservation;Splinting;Taping;Vasopneumatic Device    PT Next Visit Plan  progress R LE strength and ROM    Consulted and Agree with Plan of Care  Patient       Patient will benefit from skilled therapeutic intervention in order to improve the following deficits and impairments:  Decreased range of motion, Difficulty walking, Decreased activity tolerance, Pain, Decreased balance, Decreased scar mobility, Hypomobility, Impaired flexibility, Improper body mechanics, Postural dysfunction, Increased edema, Decreased strength  Visit Diagnosis: Chronic pain of right knee  Stiffness of right knee, not elsewhere classified  Pain in right ankle and joints of right foot  Muscle weakness (generalized)  Difficulty in walking, not elsewhere classified  Localized edema     Problem List Patient Active Problem List   Diagnosis Date Noted  . Chest pain, musculoskeletal 06/14/2017  . Seasonal allergic conjunctivitis 07/13/2016  . Upper airway cough syndrome 06/29/2016  . Neck pain on left side 08/13/2015  . Other allergic rhinitis 05/23/2015  . Back pain with sciatica on L  08/18/2014  . Paresthesia of bilateral legs 06/11/2014  . Health care maintenance 12/17/2013  . Acute upper respiratory infection 01/14/2013  . Essential hypertension 08/03/2012  . Osteoporosis 12/24/2009  . COLONIC POLYPS, HYPERPLASTIC 11/23/2007  . Hyperlipidemia 08/15/2007  .  Morbid obesity ([place]) 08/15/2007  . RHINITIS, CHRONIC 08/15/2007  . Arthropathy 08/15/2007    [name], PT, DPT 01/16/19 12:43 PM    [facility] [address]  [place], [place], [address] Phone: [phone]   Fax:  [phone]  Name: BRYTNEY SOMES MRN: [id number] Date of Birth: [birth year]

## 2019-01-23 ENCOUNTER — Other Ambulatory Visit: Payer: Self-pay

## 2019-01-23 ENCOUNTER — Encounter: Payer: Self-pay | Admitting: Physical Therapy

## 2019-01-23 ENCOUNTER — Ambulatory Visit: Payer: Medicare Other | Admitting: Physical Therapy

## 2019-01-23 DIAGNOSIS — R262 Difficulty in walking, not elsewhere classified: Secondary | ICD-10-CM

## 2019-01-23 DIAGNOSIS — M6281 Muscle weakness (generalized): Secondary | ICD-10-CM

## 2019-01-23 DIAGNOSIS — G8929 Other chronic pain: Secondary | ICD-10-CM

## 2019-01-23 DIAGNOSIS — M25561 Pain in right knee: Secondary | ICD-10-CM | POA: Diagnosis not present

## 2019-01-23 DIAGNOSIS — R600 Localized edema: Secondary | ICD-10-CM

## 2019-01-23 DIAGNOSIS — M25661 Stiffness of right knee, not elsewhere classified: Secondary | ICD-10-CM

## 2019-01-23 DIAGNOSIS — M25571 Pain in right ankle and joints of right foot: Secondary | ICD-10-CM

## 2019-01-23 NOTE — Therapy (Signed)
[facility] [address]  [place], [place], [address] Phone: [phone]   Fax:  [phone]  Physical Therapy Treatment  Patient Details  Name: [name] MRN: [id number] Date of Birth: 03-24-1931 Referring Provider (PT): [name], MD   PT Therapy Telehealth Visit:  I connected with Korayma Hagwood today at 2:55pm by Webex video conference and verified that I am speaking with the correct person using two identifiers.  I discussed the limitations, risks, security and privacy concerns of performing an evaluation and management service by Webex and the availability of in person appointments.  I also discussed with the patient that there may be a patient responsible charge related to this service. The patient expressed understanding and agreed to proceed.    The patient's address was confirmed.  Identified to the patient that therapist is a licensed PT in the state of [place].  Verified phone # as [phone] to call in case of technical difficulties.     Encounter Date: 01/23/2019  PT End of Session - 01/23/19 1547    Visit Number  13    Number of Visits  15    Date for PT Re-Evaluation  02/06/19    Authorization Type  [employer] Medicare    PT Start Time  1455    PT Stop Time  1542    PT Time Calculation (min)  47 min    Activity Tolerance  Patient tolerated treatment well    Behavior During Therapy  WFL for tasks assessed/performed       Past Medical History:  Diagnosis Date  . Arthropathy, unspecified, site unspecified    Dr. [name]...right clavicular head swelling 2003  . Colon polyp    colonoscopy 06-24-06 with no adenomatous change, hyperplasia  only  . Environmental allergies   . Healthcare maintenance    CPX 12-24-09, Td 1/05, pneumovax 2000 age 17, GYN [name] ([facility])  . Hyperlipidemia    target less than 160 > try off muscle aches 03-18-10  . Morbid obesity ([place])    ideal = 142.  target wt = 158  for BMI < 30  . Osteopenia    BMD 08-07-08 tspin - 1.2, left femur - .4, right femur +.2.  repeat 01-02-10  1.9    0.1   0.5  . Rhinitis, chronic    Dr. [name]...on immunotherapy since 5/08    Past Surgical History:  Procedure Laterality Date  . BUNIONECTOMY  1982   bilateral  . FOOT SURGERY  1970   bilateral 5th toes, bones removed  . GANGLION CYST EXCISION  2000   3rd finger right hand  . TUBAL LIGATION  1971    There were no vitals filed for this visit.  Subjective Assessment - 01/23/19 1544    Subjective  Reports that her R knee is extra sore today from taking her husband to the MD. Tried to perform her STS exercise but it hurt her knee. Has also been having trouble with her back pain.    Pertinent History  chronic rhinitis, osteopenia, HLD, arthropathy, B 5th toe bone removal, B bunionectomy     Diagnostic tests  none recent    Patient Stated Goals  "I would not have any pain in my ankle"    Currently in Pain?  Yes    Pain Score  6     Pain Location  Knee    Pain Orientation  Right;Anterior    Pain Descriptors /  Indicators  Sharp    Pain Type  Chronic pain                       [facility] Adult PT Treatment/Exercise - 01/23/19 0001      Transfers   Transfers  Floor to Transfer    Floor to Transfer  5: Supervision    Comments  edu on proper set up and sequencing with floor to stand and stand to floor transfer; patient requiring assistance from granddaughter to stabilize chair during transfer; patient lost control of stand to floor transfer and landed on bottom but with no complaints or pain      Knee/Hip Exercises: Stretches   Active Hamstring Stretch  Right;Left;1 rep;30 seconds    Active Hamstring Stretch Limitations  to tolerance   decreased flexibility R vs. L     Knee/Hip Exercises: Seated   Sit to Sand  5 reps;without UE support;1 set   red TB above knees; cues for set up and avoiding valgus      Knee/Hip Exercises: Supine   Bridges with  Clamshell  Strengthening;Both;1 set;15 reps   with green TB above knees   Straight Leg Raises  Right;Strengthening;1 set;10 reps    Straight Leg Raises Limitations  cues for quad set before each rep    Straight Leg Raise with External Rotation  Strengthening;Right;1 set;10 reps    Straight Leg Raise with External Rotation Limitations  cues for quad set before each rep      Knee/Hip Exercises: Sidelying   Clams  x10 each side with green TB above knees   cues to roll hips forward      Ankle Exercises: Seated   Other Seated Ankle Exercises  R ankle DF, INV, EV with red TB x15 each   with granddaughter anchoring TB; heavy VC/demonstration            PT Education - 01/23/19 1545    Education Details  edu on importance of avoiding prolonged positoning and promoting movement to prevent knee pain; advised to ice and elevate knee 10-15 min to alleviate swelling and pain    Person(s) Educated  Patient    Methods  Explanation;Demonstration;Tactile cues;Verbal cues    Comprehension  Verbalized understanding;Returned demonstration       PT Short Term Goals - 12/26/18 1135      PT SHORT TERM GOAL #1   Title  Patient to be independent with initial HEP.    Time  3    Period  Weeks    Status  On-going   12/26/18 reporting poor compliance d/t husband's poor health   Target Date  01/16/19        PT Long Term Goals - 01/02/19 1206      PT LONG TERM GOAL #1   Title  Patient to be independent with advanced HEP.    Time  6    Period  Weeks    Status  On-going   12/26/18: reporting poor compliance d/t husband's poor health     PT LONG TERM GOAL #2   Title  Patient to demonstrate R ankle AROM WFL and without pain limiting.     Time  6    Period  Weeks    Status  Deferred   12/26/18: deferred d/t limitations of telehealth      PT LONG TERM GOAL #3   Title  Patient to demonstrate R knee AROM WFL and without pain limiting.     Time  6    Period  Weeks    Status  Deferred    12/26/18: deferred d/t limitations of telehealth; per patient- reports she is better able to straighter her R knee than before     PT LONG TERM GOAL #4   Title  Patient to demonstrate B LE strength >=4+/5.    Time  6    Period  Weeks    Status  Deferred   12/26/18: deferred d/t limitations of telehealth     PT LONG TERM GOAL #5   Title  Patient to report tolerance of 1 hour of standing/walking without pain limiting.     Time  6    Period  Weeks    Status  Partially Met   12/26/18: reports 56mn-1 hour before onset of pain     PT LONG TERM GOAL #6   Title  Patient to score <= 14.8 seconds on 5xSTS without use of UEs in order to improve LE muscle strength and power.     Time  6    Period  Weeks    Status  On-going      PT LONG TERM GOAL #7   Title  Patient to demonstrate safe floor to stand transfer independently and without cues.     Time  6    Period  Weeks    Status  On-going            Plan - 01/23/19 1701    Clinical Impression Statement  Patient seen via telehealth appointment. Reporting increased pain in R knee today without known cause, however revealing that she did a lot of prolonged sitting earlier in the day. Reporting that she has tried performing STS as part of her HEP, but having difficulty because of pain and edema. Advised patient on using ice and elevation in order to alleviate these symptoms. Worked on progressive LE strengthening ther-ex while avoiding excessive R LE WBing to avoid exacerbation of knee pain. Tolerated all floor exercises well today. Required demonstration and cues for sequencing of floor<>stand transfer as patient demonstrated lack of control while trying to transfer to the floor d/t poor transfer technique. Instructed patient on visual check to catch any bruises, scrapes, or other injuries, but patient verbalized no injury or pain from this incident. Patient reported improvement in R knee pain at end of session, thus worked on STS with banded  resistance at knees and with added elevation from pillow on seat. Patient required cues to avoid valgus collapse of knees while rising from sit to stand. Ended session with 2/10 pain in R knee compared to 6/10 rating at beginning of session.     Clinical Impairments Affecting Rehab Potential  chronic rhinitis, osteopenia, HLD, arthropathy, B 5th toe bone removal, B bunionectomy     PT Treatment/Interventions  ADLs/Self Care Home Management;Cryotherapy;Electrical Stimulation;Iontophoresis '4mg'$ /ml Dexamethasone;Functional mobility training;Stair training;Gait training;DME Instruction;Ultrasound;Moist Heat;Therapeutic activities;Therapeutic exercise;Balance training;Neuromuscular re-education;Patient/family education;Orthotic Fit/Training;Passive range of motion;Scar mobilization;Manual techniques;Dry needling;Energy conservation;Splinting;Taping;Vasopneumatic Device    PT Next Visit Plan  assess compliance to HEP and increased activity regimen; progress R LE strength and ROM    Consulted and Agree with Plan of Care  Patient       Patient will benefit from skilled therapeutic intervention in order to improve the following deficits and impairments:  Decreased range of motion, Difficulty walking, Decreased activity tolerance, Pain, Decreased balance, Decreased scar mobility, Hypomobility, Impaired flexibility, Improper body mechanics, Postural dysfunction, Increased edema, Decreased strength  Visit Diagnosis: Chronic pain of right knee  Stiffness of right knee, not elsewhere classified  Pain in right ankle and joints of right foot  Muscle weakness (generalized)  Difficulty in walking, not elsewhere classified  Localized edema     Problem List Patient Active Problem List   Diagnosis Date Noted  . Chest pain, musculoskeletal 06/14/2017  . Seasonal allergic conjunctivitis 07/13/2016  . Upper airway cough syndrome 06/29/2016  . Neck pain on left side 08/13/2015  . Other allergic rhinitis  05/23/2015  . Back pain with sciatica on L  08/18/2014  . Paresthesia of bilateral legs 06/11/2014  . Health care maintenance 12/17/2013  . Acute upper respiratory infection 01/14/2013  . Essential hypertension 08/03/2012  . Osteoporosis 12/24/2009  . COLONIC POLYPS, HYPERPLASTIC 11/23/2007  . Hyperlipidemia 08/15/2007  . Morbid obesity ([place]) 08/15/2007  . RHINITIS, CHRONIC 08/15/2007  . Arthropathy 08/15/2007    [name], PT, DPT 01/23/19 5:13 PM    [facility] [address]  [place], [place], [address] Phone: [phone]   Fax:  [phone]  Name: TWALA COLLINGS MRN: [id number] Date of Birth: 1931/06/09

## 2019-01-25 ENCOUNTER — Ambulatory Visit (INDEPENDENT_AMBULATORY_CARE_PROVIDER_SITE_OTHER): Payer: Medicare Other | Admitting: Internal Medicine

## 2019-01-25 ENCOUNTER — Other Ambulatory Visit: Payer: Self-pay

## 2019-01-25 DIAGNOSIS — I10 Essential (primary) hypertension: Secondary | ICD-10-CM

## 2019-01-25 DIAGNOSIS — R05 Cough: Secondary | ICD-10-CM

## 2019-01-25 DIAGNOSIS — E7800 Pure hypercholesterolemia, unspecified: Secondary | ICD-10-CM | POA: Diagnosis not present

## 2019-01-25 DIAGNOSIS — R058 Other specified cough: Secondary | ICD-10-CM

## 2019-01-25 MED ORDER — SULINDAC 200 MG PO TABS: 200.0000 mg | ORAL_TABLET | Freq: Two times a day (BID) | ORAL | 3 refills | Status: DC

## 2019-01-25 MED ORDER — SIMVASTATIN 40 MG PO TABS: 40.0000 mg | ORAL_TABLET | Freq: Every day | ORAL | 3 refills | Status: DC

## 2019-01-25 NOTE — Progress Notes (Signed)
Subjective:    Patient ID: [name], female   DOB: [birth year]    MRN: [id number]   Brief patient profile:  66  yobf remote minimal smoking hx (quit 1957)  with seasonal rhinits flares better overall since started on immunotherapy May2008 completed aug 2018 >  Followed in pulmonary clinic with hyperlipidemia/djd     History of Present Illness  10/06/2017  f/u ov/ re: hyperlipidemia/ chronic rhinitis no long seeing allergy/bardelas Chief Complaint  Patient presents with  . Annual Exam    Pt is fasting. She is c/o nasal congestion and sneezing.   worse nasal symptoms x 3-4 weeks just using flonase / not zyrtec  No reg ex but Not limited by breathing from desired activities  rec  Take flonase consistently and supplement with the zyrtec up to 10 mg daily > if not effective return to Dr Shaune Leeks  Please remember to go to the lab department downstairs in the basement  for your tests - we will call you with the results when they are available.    04/25/2018  f/u ov/ re:  Allergic rhinitis/ hyperlipidemia/ djd  Chief Complaint  Patient presents with  . Follow-up    Pt states she has been doing okay since last visit and states she has been having some problems with postnasal drainage that is worse in the evenins.  Dyspnea:  Walked all over [employer] but not doing Y any more due to husband with dementia Cough: just pnds at hs  Sleeping: fine despite pnds  SABA use: no 02: no  Arthritis symptoms better with injections/ prn clinioril  And tol zocor 40 s muscle aches  rec No change rx     07/27/2018  f/u ov/ re:  Annual eval/   Rhinitis/ hyperlidpemia/ djd [name])  Chief Complaint  Patient presents with  . Follow-up    3 month visit - under more stress recently with husbands illness. Also time for annual check up she thinks.  Dyspnea:  Not limited by breathing from desired activities  Nor ex cp Cough: none Sleeping: flat/ one pillow/ pillow R knee rec Please see patient  coordinator before you leave today  to schedule bone density Please remember to go to the lab department downstairs in the basement  for your tests - we will call you with the results when they are available    Virtual Visit via Telephone Note 01/25/2019 rhinitis/djd on clinoril    I connected with [name] on 01/25/19 at 10 am  by telephone and verified that I am speaking with the correct person using two identifiers.   I discussed the limitations, risks, security and privacy concerns of performing an evaluation and management service by telephone and the availability of in person appointments. I also discussed with the patient that there may be a patient responsible charge related to this service. The patient expressed understanding and agreed to proceed.   History of Present Illness: Dyspnea:  Not limited by breathing from desired activities   Cough: some pnds, esp in am >taking zyrtec s flonase  Sleeping: fine SABA use: none 02: none  Main problem has been wearing herself taking care of her terminally ill husband at home   No obvious day to day or daytime variability or assoc excess/ purulent sputum or mucus plugs or hemoptysis or cp or chest tightness, subjective wheeze or overt sinus or hb symptoms.    Also denies any obvious fluctuation of symptoms with weather or environmental changes or other  aggravating or alleviating factors except as outlined above.   Meds reviewed/ med reconciliation completed        Observations/Objective: Sounds great, no cough or hoarseness,no air hunger on phone  Assessment and Plan: See problem list for active a/p's   Follow Up Instructions: See avs for instructions unique to this ov which includes revised/ updated med list     I discussed the assessment and treatment plan with the patient. The patient was provided an opportunity to ask questions and all were answered. The patient agreed with the plan and demonstrated an  understanding of the instructions.   The patient was advised to call back or seek an in-person evaluation if the symptoms worsen or if the condition fails to improve as anticipated.  I provided 12 minutes of non-face-to-face time during this encounter.   Christinia Gully, MD             Past Medical History:  ARTHRITIS (ICD-716.90)  Right clavicular head swelling chronic/ new R shoulder pain 06/14/2017  .......................Marland KitchenDr Elwin Sleight Hand Pain.................................................Marland Kitchen   [facility] RHINITIS, CHRONIC (ICD-472.0).................. [facility] / Bardelas - on immunotherapy since 01/2007 - stopped 04/2017  HYPERLIPIDEMIA (ICD-272.4)  -target LDL less than 160 > try off due to musle aches March 18, 2010 > rechallenged with half dose fall 2011 and tol ok  MORBID OBESITY (ICD-278.01)  - ideal 142 Target wt = 158 for BMI < 30  COLON POLYPS  - colonoscopy 06/24/2006 and 10/08/11 >> no recall due to age  OSTEOPENIA  -BMD 08/07/08 tspine -1.2, Left femur -.4, right femur +.2  - Repeat 01/02/2010 1.9 0.1 0.5  Health Maintenance.......................................................Marland KitchenWert  - CPX 07/27/2018  - Td 09/2003 > 03/27/2013  - Pneumovax 2000 age 85  And prevnar 13 02/27/15  - GYN [name] ([facility])      Family History:  Family History Colon Cancer in mother > neg colon @ f/u 2013 > no further f/u per Dr Olevia Perches  she is an only child and knows nothing about her father's health      Social History:  Patient states former smoker quit age 78  Negative history of passive tobacco smoke exposure.  Exercise-2 times weekly  Caffeine-2 cups daily  Married with 4 children  Risk Factors:  Smoking Status: quit Age 46

## 2019-01-25 NOTE — Patient Instructions (Addendum)
I emphasized that nasal steroids(like flonase/fluticasone)  have no immediate benefit in terms of improving symptoms.  To help them reached the target tissue, the patient should use Afrin two puffs every 12 hours applied one min before using the nasal steroids.  Afrin should be stopped after no more than 5 days.  If the symptoms worsen, Afrin can be restarted after 5 days off of therapy to prevent rebound congestion from overuse of Afrin.  I also emphasized that in no way are nasal steroids a concern in terms of "addiction".    Come to lab at 9 am any day to check your kidneys and liver functions and cholesterol profile - come fasting   CPX due Aug 07 2019  - pt has Mychart so no paper copy needed

## 2019-01-30 ENCOUNTER — Encounter: Payer: Self-pay | Admitting: Internal Medicine

## 2019-01-30 NOTE — Assessment & Plan Note (Signed)
Lab Results  Component Value Date   CREATININE 0.91 07/27/2018   CREATININE 0.78 06/14/2017   CREATININE 0.90 03/02/2016   CREATININE 0.82 07/16/2014    Labs due for repeat.

## 2019-01-30 NOTE — Assessment & Plan Note (Signed)
-  target LDL less than 160 > try off statins due to muscle aches March 18, 2010 > rechallenged with half dose fall 2011 and tol ok  - rechallenged with zocor 40 mg daily 05/31/2013 > tolerated well   rec check labs / no change rx

## 2019-01-30 NOTE — Assessment & Plan Note (Signed)
Assoc with pnds > try adding back flonase/ advised on approp use of afrin -see avs for instructions unique to this ov

## 2019-01-31 ENCOUNTER — Ambulatory Visit: Payer: Medicare Other | Admitting: Physical Therapy

## 2019-02-06 ENCOUNTER — Telehealth: Payer: Self-pay | Admitting: Internal Medicine

## 2019-02-06 ENCOUNTER — Other Ambulatory Visit (INDEPENDENT_AMBULATORY_CARE_PROVIDER_SITE_OTHER): Payer: Medicare Other

## 2019-02-06 DIAGNOSIS — E7800 Pure hypercholesterolemia, unspecified: Secondary | ICD-10-CM | POA: Diagnosis not present

## 2019-02-06 DIAGNOSIS — I10 Essential (primary) hypertension: Secondary | ICD-10-CM | POA: Diagnosis not present

## 2019-02-06 LAB — BASIC METABOLIC PANEL
BUN: 22 mg/dL (ref 6–23)
CO2: 28 mEq/L (ref 19–32)
Calcium: 9.6 mg/dL (ref 8.4–10.5)
Chloride: 107 mEq/L (ref 96–112)
Creatinine, Ser: 0.96 mg/dL (ref 0.40–1.20)
GFR: 66.66 mL/min (ref >60.00)
Glucose, Bld: 102 mg/dL — ABNORMAL HIGH (ref 70–99)
Potassium: 4.1 mEq/L (ref 3.5–5.1)
Sodium: 141 mEq/L (ref 135–145)

## 2019-02-06 LAB — HEPATIC FUNCTION PANEL
ALT: 13 U/L (ref 0–35)
AST: 15 U/L (ref 0–37)
Albumin: 4 g/dL (ref 3.5–5.2)
Alkaline Phosphatase: 61 U/L (ref 39–117)
Bilirubin, Direct: 0.1 mg/dL (ref 0.0–0.3)
Total Bilirubin: 0.5 mg/dL (ref 0.2–1.2)
Total Protein: 7 g/dL (ref 6.0–8.3)

## 2019-02-06 LAB — LIPID PANEL
Cholesterol: 275 mg/dL — ABNORMAL HIGH (ref 0–200)
HDL: 77 mg/dL (ref >39.00)
LDL Cholesterol: 181 mg/dL — ABNORMAL HIGH (ref 0–99)
NonHDL: 197.9
Total CHOL/HDL Ratio: 4
Triglycerides: 85 mg/dL (ref 0.0–149.0)
VLDL: 17 mg/dL (ref 0.0–40.0)

## 2019-02-06 NOTE — Telephone Encounter (Signed)
Pt aware of labs. Call patient : Studies are ok x ldl trending up - good news is that HDL quite high too which is protective so no change rx

## 2019-02-13 ENCOUNTER — Ambulatory Visit: Payer: Medicare Other | Attending: Orthopedic Surgery | Admitting: Physical Therapy

## 2019-02-13 ENCOUNTER — Other Ambulatory Visit: Payer: Self-pay

## 2019-02-13 ENCOUNTER — Encounter: Payer: Self-pay | Admitting: Physical Therapy

## 2019-02-13 DIAGNOSIS — M6281 Muscle weakness (generalized): Secondary | ICD-10-CM

## 2019-02-13 DIAGNOSIS — M25571 Pain in right ankle and joints of right foot: Secondary | ICD-10-CM

## 2019-02-13 DIAGNOSIS — G8929 Other chronic pain: Secondary | ICD-10-CM

## 2019-02-13 DIAGNOSIS — R600 Localized edema: Secondary | ICD-10-CM

## 2019-02-13 DIAGNOSIS — M25661 Stiffness of right knee, not elsewhere classified: Secondary | ICD-10-CM

## 2019-02-13 DIAGNOSIS — R262 Difficulty in walking, not elsewhere classified: Secondary | ICD-10-CM | POA: Diagnosis present

## 2019-02-13 DIAGNOSIS — M25561 Pain in right knee: Secondary | ICD-10-CM | POA: Diagnosis present

## 2019-02-13 NOTE — Therapy (Signed)
[facility] [address]  [place], [place], [address] Phone: [phone]   Fax:  [phone]  Physical Therapy Treatment  Patient Details  Name: [name] MRN: [id number] Date of Birth: 27-May-1931 Referring Provider (PT): [name], MD   PT Therapy Telehealth Visit:  I connected with [name] today at 10:56am by [facility] video conference and verified that I am speaking with the correct person using two identifiers.  I discussed the limitations, risks, security and privacy concerns of performing an evaluation and management service by Webex and the availability of in person appointments.  I also discussed with the patient that there may be a patient responsible charge related to this service. The patient expressed understanding and agreed to proceed.    The patient's address was confirmed.  Identified to the patient that therapist is a licensed PT in the state of [place].  Verified phone # as [phone] to call in case of technical difficulties.   Progress Note Reporting Period 12/26/18 to 02/13/19  See note below for Objective Data and Assessment of Progress/Goals.      Encounter Date: 02/13/2019  PT End of Session - 02/13/19 1157    Visit Number  14    Number of Visits  15    Date for PT Re-Evaluation  02/06/19    Authorization Type  [employer]HC Medicare    PT Start Time  1056    PT Stop Time  1143    PT Time Calculation (min)  47 min    Activity Tolerance  Patient tolerated treatment well    Behavior During Therapy  WFL for tasks assessed/performed       Past Medical History:  Diagnosis Date  . Arthropathy, unspecified, site unspecified    Dr. [name]...right clavicular head swelling 2003  . Colon polyp    colonoscopy 06-24-06 with no adenomatous change, hyperplasia  only  . Environmental allergies   . Healthcare maintenance    CPX 12-24-09, Td 1/05, pneumovax 2000 age 72, GYN [name] ([facility])  .  Hyperlipidemia    target less than 160 > try off muscle aches 03-18-10  . Morbid obesity ([place])    ideal = 142.  target wt = 158 for BMI < 30  . Osteopenia    BMD 08-07-08 tspin - 1.2, left femur - .4, right femur +.2.  repeat 01-02-10  1.9    0.1   0.5  . Rhinitis, chronic    Dr. [name]...on immunotherapy since 5/08    Past Surgical History:  Procedure Laterality Date  . BUNIONECTOMY  1982   bilateral  . FOOT SURGERY  1970   bilateral 5th toes, bones removed  . GANGLION CYST EXCISION  2000   3rd finger right hand  . TUBAL LIGATION  1971    There were no vitals filed for this visit.  Subjective Assessment - 02/13/19 1154    Subjective  Reports that her husband had had a setback and she had to cancel her last appointment. Has not been able to perform her HEP as a result, but is feeling a little better and walking a bit more. Also has not been having to take pain meds for her knee.     Pertinent History  chronic rhinitis, osteopenia, HLD, arthropathy, B 5th toe bone removal, B bunionectomy     Diagnostic tests  none recent    Patient Stated Goals  "I would not have any pain in my ankle"  Currently in Pain?  Yes    Pain Score  4     Pain Location  Knee    Pain Orientation  Right;Anterior    Pain Descriptors / Indicators  Sharp    Pain Type  Chronic pain         [facility] PT Assessment - 02/13/19 0001      Standardized Balance Assessment   Standardized Balance Assessment  Five Times Sit to Stand    Five times sit to stand comments   14.4 without UE use                   [facility] Adult PT Treatment/Exercise - 02/13/19 0001      Transfers   Transfers  Floor to Transfer    Floor to Transfer  5: Supervision    Comments  cues on sequencing and foot positioning for safety and preventing pain on R knee      Knee/Hip Exercises: Seated   Sit to Sand  3 sets;5 reps;without UE support   last set with red TB above knees     Knee/Hip Exercises: Supine   Bridges   Strengthening;Both;1 set;10 reps    Bridges with Clamshell  Strengthening;Both;1 set;10 reps   green loop above knees   Straight Leg Raises  Right;Strengthening;1 set;10 reps    Straight Leg Raises Limitations  cues for quad set before each rep    Straight Leg Raise with External Rotation  Strengthening;Right;1 set;10 reps    Straight Leg Raise with External Rotation Limitations  cues for quad set before each rep      Knee/Hip Exercises: Sidelying   Clams  x10 each side with green TB above knees   cues to avoid trunk rotation            PT Education - 02/13/19 1156    Education Details  update to HEP; edu on benefits of continued physical activity; discussion on objective progress with PT    Person(s) Educated  Patient    Methods  Explanation;Demonstration;Tactile cues;Verbal cues;Handout    Comprehension  Verbalized understanding;Returned demonstration       PT Short Term Goals - 02/13/19 1201      PT SHORT TERM GOAL #1   Title  Patient to be independent with initial HEP.    Time  3    Period  Weeks    Status  On-going   02/13/19 reporting poor compliance d/t husband's poor health   Target Date  01/16/19        PT Long Term Goals - 02/13/19 1202      PT LONG TERM GOAL #1   Title  Patient to be independent with advanced HEP.    Time  6    Period  Weeks    Status  On-going   02/13/19: reporting poor compliance d/t husband's poor health     PT LONG TERM GOAL #2   Title  Patient to demonstrate R ankle AROM WFL and without pain limiting.     Time  6    Period  Weeks    Status  Deferred   02/13/19: deferred d/t limitations of telehealth      PT LONG TERM GOAL #3   Title  Patient to demonstrate R knee AROM WFL and without pain limiting.     Time  6    Period  Weeks    Status  Deferred   02/13/19: deferred d/t limitations of telehealth     PT LONG  TERM GOAL #4   Title  Patient to demonstrate B LE strength >=4+/5.    Time  6    Period  Weeks    Status   Deferred   02/13/19: deferred d/t limitations of telehealth     PT LONG TERM GOAL #5   Title  Patient to report tolerance of 1 hour of standing/walking without pain limiting.     Time  6    Period  Weeks    Status  Partially Met   02/10/19: reports 31mn before onset of pain     PT LONG TERM GOAL #6   Title  Patient to score <= 14.8 seconds on 5xSTS without use of UEs in order to improve LE muscle strength and power.     Time  6    Period  Weeks    Status  Achieved   02/13/19: 14.4 sec without UEs     PT LONG TERM GOAL #7   Title  Patient to demonstrate safe floor to stand transfer independently and without cues.     Time  6    Period  Weeks    Status  Partially Met   02/13/19: requiring verbal cues for sequencing and safety           Plan - 02/13/19 1210    Clinical Impression Statement  Patient seen for telehealth appointment today. Reporting continued noncompliance with HEP d/t husband's poor health. Patient however reporting improvements in R knee pain, now no longer having to take pain meds, and is also walking more. D/t patient's limited compliance and plateau in progress, placing patient on 30 day hold at this time. Encouraged patient to return when she is better able to find time for herself- patient agreeable. Several goals today deferred d/t the limitations of telehealth. Patient has shown excellent improvement in 5xSTS- today scoring 14.4 seconds which puts her within the norm for her age. Patient still requiring verbal cues for sequencing, safety, and preventing pain in R knee with floor to stand transfers. Better able to perform transfers when verbal cues provided. Patient tolerated all sitting and mat ther-ex well today. Reviewed basic HEP with patient for improved compliance and carryover. Patient reported understanding. No complaints at end of session. Patient on 30 day hold at this time.     Clinical Impairments Affecting Rehab Potential  chronic rhinitis,  osteopenia, HLD, arthropathy, B 5th toe bone removal, B bunionectomy     PT Treatment/Interventions  ADLs/Self Care Home Management;Cryotherapy;Electrical Stimulation;Iontophoresis '4mg'$ /ml Dexamethasone;Functional mobility training;Stair training;Gait training;DME Instruction;Ultrasound;Moist Heat;Therapeutic activities;Therapeutic exercise;Balance training;Neuromuscular re-education;Patient/family education;Orthotic Fit/Training;Passive range of motion;Scar mobilization;Manual techniques;Dry needling;Energy conservation;Splinting;Taping;Vasopneumatic Device    PT Next Visit Plan  30 day hold    Consulted and Agree with Plan of Care  Patient       Patient will benefit from skilled therapeutic intervention in order to improve the following deficits and impairments:  Decreased range of motion, Difficulty walking, Decreased activity tolerance, Pain, Decreased balance, Decreased scar mobility, Hypomobility, Impaired flexibility, Improper body mechanics, Postural dysfunction, Increased edema, Decreased strength  Visit Diagnosis: Chronic pain of right knee  Stiffness of right knee, not elsewhere classified  Pain in right ankle and joints of right foot  Muscle weakness (generalized)  Difficulty in walking, not elsewhere classified  Localized edema     Problem List Patient Active Problem List   Diagnosis Date Noted  . Chest pain, musculoskeletal 06/14/2017  . Seasonal allergic conjunctivitis 07/13/2016  . Upper airway cough syndrome 06/29/2016  . Neck  pain on left side 08/13/2015  . Other allergic rhinitis 05/23/2015  . Back pain with sciatica on L  08/18/2014  . Paresthesia of bilateral legs 06/11/2014  . Health care maintenance 12/17/2013  . Acute upper respiratory infection 01/14/2013  . Essential hypertension 08/03/2012  . Osteoporosis 12/24/2009  . COLONIC POLYPS, HYPERPLASTIC 11/23/2007  . Hyperlipidemia 08/15/2007  . Morbid obesity ([place]) 08/15/2007  . RHINITIS, CHRONIC  08/15/2007  . Arthropathy 08/15/2007    [name], PT, DPT 02/13/19 12:16 PM    [facility] [address]  [place], [place], [address] Phone: [phone]   Fax:  [phone]  Name: [name] MRN: [id number] Date of Birth: 03-21-1931

## 2019-03-20 ENCOUNTER — Ambulatory Visit: Payer: Medicare Other | Attending: Orthopedic Surgery | Admitting: Physical Therapy

## 2019-03-20 ENCOUNTER — Encounter: Payer: Self-pay | Admitting: Physical Therapy

## 2019-03-20 ENCOUNTER — Other Ambulatory Visit: Payer: Self-pay

## 2019-03-20 DIAGNOSIS — R262 Difficulty in walking, not elsewhere classified: Secondary | ICD-10-CM | POA: Diagnosis present

## 2019-03-20 DIAGNOSIS — M25561 Pain in right knee: Secondary | ICD-10-CM | POA: Insufficient documentation

## 2019-03-20 DIAGNOSIS — M6281 Muscle weakness (generalized): Secondary | ICD-10-CM | POA: Diagnosis present

## 2019-03-20 DIAGNOSIS — R600 Localized edema: Secondary | ICD-10-CM | POA: Insufficient documentation

## 2019-03-20 DIAGNOSIS — G8929 Other chronic pain: Secondary | ICD-10-CM | POA: Diagnosis present

## 2019-03-20 DIAGNOSIS — M25661 Stiffness of right knee, not elsewhere classified: Secondary | ICD-10-CM | POA: Insufficient documentation

## 2019-03-20 DIAGNOSIS — M25571 Pain in right ankle and joints of right foot: Secondary | ICD-10-CM | POA: Insufficient documentation

## 2019-03-20 NOTE — Therapy (Signed)
[facility] [address]  [place], [place], [address] Phone: [phone]   Fax:  [phone]  Physical Therapy Treatment  Patient Details  Name: [name] MRN: [id number] Date of Birth: 04-27-1931 Referring Provider (PT): [name], MD   PT Therapy Telehealth Visit:  I connected with [name] today at 2:08pm by Webex video conference and verified that I am speaking with the correct person using two identifiers.  I discussed the limitations, risks, security and privacy concerns of performing an evaluation and management service by Webex and the availability of in person appointments.  I also discussed with the patient that there may be a patient responsible charge related to this service. The patient expressed understanding and agreed to proceed.    The patient's address was confirmed.  Identified to the patient that therapist is a licensed PT in the state of [place].  Verified phone # as [phone] to call in case of technical difficulties.    Encounter Date: 03/20/2019  PT End of Session - 03/20/19 1625    Visit Number  15    Number of Visits  18    Date for PT Re-Evaluation  05/01/19    Authorization Type  UHC Medicare    PT Start Time  1408    PT Stop Time  1447    PT Time Calculation (min)  39 min    Activity Tolerance  Patient tolerated treatment well;Patient limited by pain    Behavior During Therapy  WFL for tasks assessed/performed       Past Medical History:  Diagnosis Date  . Arthropathy, unspecified, site unspecified    Dr. [name]...right clavicular head swelling 2003  . Colon polyp    colonoscopy 06-24-06 with no adenomatous change, hyperplasia  only  . Environmental allergies   . Healthcare maintenance    CPX 12-24-09, Td 1/05, pneumovax 2000 age 49, GYN [name] ([facility])  . Hyperlipidemia    target less than 160 > try off muscle aches 03-18-10  . Morbid obesity ([place])    ideal =  142.  target wt = 158 for BMI < 30  . Osteopenia    BMD 08-07-08 tspin - 1.2, left femur - .4, right femur +.2.  repeat 01-02-10  1.9    0.1   0.5  . Rhinitis, chronic    Dr. [name]...on immunotherapy since 5/08    Past Surgical History:  Procedure Laterality Date  . BUNIONECTOMY  1982   bilateral  . FOOT SURGERY  1970   bilateral 5th toes, bones removed  . GANGLION CYST EXCISION  2000   3rd finger right hand  . TUBAL LIGATION  1971    There were no vitals filed for this visit.  Subjective Assessment - 03/20/19 1617    Subjective  Patient reports that she is wanting to start back up with PT because of increased pain in R knee when trying to sleep- unable to get into a positiion that is comfortable as well as increased pain when she is on her feet. 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 Pain on average have been a 4-5/10. Has been walking for about 15 min/day.    Pertinent History  chronic rhinitis, osteopenia, HLD, arthropathy, B 5th toe bone removal, B bunionectomy     Diagnostic tests  none recent    Patient Stated Goals  "I would not have any pain in  my ankle"    Currently in Pain?  Yes    Pain Score  5     Pain Location  Knee    Pain Orientation  Right;Anterior    Pain Descriptors / Indicators  Sharp    Pain Type  Chronic pain         [facility] PT Assessment - 03/20/19 0001      Assessment   Medical Diagnosis  Bunion of L foot    Referring Provider (PT)  [name], MD    Onset Date/Surgical Date  03/19/18      Standardized Balance Assessment   Standardized Balance Assessment  Five Times Sit to Stand    Five times sit to stand comments   17.58 sec without UE use                   [facility] Adult PT Treatment/Exercise - 03/20/19 0001      Transfers   Transfers  Floor to Transfer    Floor to Transfer  5: Supervision    Comments  safe floor<> stand transfer using chair; no verbal cues required for  safety correction      Knee/Hip Exercises: Seated   Sit to Sand  2 sets;5 reps;without UE support   with red TB above knees; good ability to set up without cues     Knee/Hip Exercises: Supine   Bridges  Strengthening;Both;1 set;10 reps    Bridges Limitations  unable to tolerate full set d/t pain in R knee- discontinued    Straight Leg Raises  Right;Strengthening;1 set;10 reps      Knee/Hip Exercises: Sidelying   Clams  x10 each side    cues to roll hips forward and keep feet together            PT Education - 03/20/19 1624    Education Details  long discussion on importance of maintaining fitness regimen and maintaining compliance for improvement in pain levels and max benefit from PT; discussion on patient's goals and impairments to be worked on; prognosis, POC, HEP    Person(s) Educated  Patient    Methods  Explanation;Demonstration;Tactile cues;Verbal cues;Handout    Comprehension  Verbalized understanding;Returned demonstration       PT Short Term Goals - 03/20/19 1629      PT SHORT TERM GOAL #1   Title  Patient to be independent with initial HEP.    Time  2    Period  Weeks    Status  On-going   noncompliance at this time   Target Date  04/03/19        PT Long Term Goals - 03/20/19 1629      PT LONG TERM GOAL #1   Title  Patient to be independent with advanced HEP.    Time  6    Period  Weeks    Status  On-going   noncompliance at this time   Target Date  05/01/19      PT LONG TERM GOAL #2   Title  Patient to demonstrate R ankle AROM WFL and without pain limiting.     Time  6    Period  Weeks    Status  Deferred   02/13/19: deferred d/t limitations of telehealth      PT LONG TERM GOAL #3   Title  Patient to demonstrate R knee AROM WFL and without pain limiting.     Time  6    Period  Weeks    Status  Deferred   02/13/19: deferred d/t limitations of telehealth     PT LONG TERM GOAL #4   Title  Patient to demonstrate B LE strength >=4+/5.     Time  6    Period  Weeks    Status  Deferred   02/13/19: deferred d/t limitations of telehealth     PT LONG TERM GOAL #5   Title  Patient to report tolerance of 1 hour of standing/walking without pain limiting.     Time  6    Period  Weeks    Status  Partially Met   reports 45 min - 1 hour before onset of pain   Target Date  05/01/19      Additional Long Term Goals   Additional Long Term Goals  Yes      PT LONG TERM GOAL #6   Title  Patient to score <= 14.8 seconds on 5xSTS without use of UEs in order to improve LE muscle strength and power.     Time  6    Period  Weeks    Status  On-going   17.58 sec without UEs   Target Date  05/01/19      PT LONG TERM GOAL #7   Title  Patient to demonstrate safe floor to stand transfer independently and without cues.     Time  6    Period  Weeks    Status  Achieved   able to perform without cues and with good safety awareness     PT LONG TERM GOAL #8   Title  Patient to report 5 hours of sleep without R knee pain limiting.    Time  6    Period  Weeks    Status  New    Target Date  05/01/19      PT LONG TERM GOAL  #9   TITLE  Patient to report compliance with walking total of 30 min/day 3x/week for improved fitness.    Time  6    Period  Weeks    Status  New    Target Date  05/01/19            Plan - 03/20/19 1626    Clinical Impression Statement  Patient seen via telehealth appointment with request to resume PT d/t increasing R knee pain with bed positioning and activity on her feet. 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 Educated patient on importance of maintaining fitness regimen and maintaining compliance for improvement in pain levels and max benefit from PT. Patient today scored 17.58 sec on 5xSTS, indicating increased risk of falls. Able to perform floor<>stand transfer  with good safety awareness. Adjusted goals to reflect improved sleeping tolerance and fitness regimen as patient is still under self-quarantine at her house d/t her and her husband's age and comorbidities. Patient tolerated all ther-ex well today with exception of bridge which caused mild R knee pain, thus it was discontinued. Patient reported understanding of all edu provided this session. Would benefit from skilled PT services every other week for 6 weeks to address goals.    Clinical Impairments Affecting Rehab Potential  chronic rhinitis, osteopenia, HLD, arthropathy, B 5th toe bone removal, B bunionectomy     PT Frequency  Biweekly    PT Duration  6 weeks    PT Treatment/Interventions  ADLs/Self Care  Home Management;Cryotherapy;Electrical Stimulation;Iontophoresis '4mg'$ /ml Dexamethasone;Functional mobility training;Stair training;Gait training;DME Instruction;Ultrasound;Moist Heat;Therapeutic activities;Therapeutic exercise;Balance training;Neuromuscular re-education;Patient/family education;Orthotic Fit/Training;Passive range of motion;Scar mobilization;Manual techniques;Dry needling;Energy conservation;Splinting;Taping;Vasopneumatic Device    PT Next Visit Plan  reassess HEP & compliance    Consulted and Agree with Plan of Care  Patient       Patient will benefit from skilled therapeutic intervention in order to improve the following deficits and impairments:  Decreased range of motion, Difficulty walking, Decreased activity tolerance, Pain, Decreased balance, Decreased scar mobility, Hypomobility, Impaired flexibility, Improper body mechanics, Postural dysfunction, Increased edema, Decreased strength  Visit Diagnosis: 1. Chronic pain of right knee   2. Stiffness of right knee, not elsewhere classified   3. Pain in right ankle and joints of right foot   4. Muscle weakness (generalized)   5. Difficulty in walking, not elsewhere classified   6. Localized edema        Problem List Patient  Active Problem List   Diagnosis Date Noted  . Chest pain, musculoskeletal 06/14/2017  . Seasonal allergic conjunctivitis 07/13/2016  . Upper airway cough syndrome 06/29/2016  . Neck pain on left side 08/13/2015  . Other allergic rhinitis 05/23/2015  . Back pain with sciatica on L  08/18/2014  . Paresthesia of bilateral legs 06/11/2014  . Health care maintenance 12/17/2013  . Acute upper respiratory infection 01/14/2013  . Essential hypertension 08/03/2012  . Osteoporosis 12/24/2009  . COLONIC POLYPS, HYPERPLASTIC 11/23/2007  . Hyperlipidemia 08/15/2007  . Morbid obesity ([place]) 08/15/2007  . RHINITIS, CHRONIC 08/15/2007  . Arthropathy 08/15/2007     [name], PT, DPT 03/20/19 4:41 PM   [facility] [address]  [place], [place], [address] Phone: [phone]   Fax:  [phone]  Name: SHALANA JARDIN MRN: [id number] Date of Birth: October 10, 1930

## 2019-03-20 NOTE — Patient Instructions (Signed)
Access Code: [email or web address]  URL: [email or web address]  Date: 03/20/2019  Prepared by: Grayling Congress   Exercises  Sit to Stand without Arm Support - 10 reps - 2 sets - 2x daily - 7x weekly  Supine Active Straight Leg Raise - 10 reps - 2 sets - 2x daily - 7x weekly  Clamshell - 10 reps - 2 sets - 2x daily - 7x weekly

## 2019-04-03 ENCOUNTER — Ambulatory Visit: Payer: Medicare Other | Admitting: Physical Therapy

## 2019-04-03 ENCOUNTER — Other Ambulatory Visit: Payer: Self-pay

## 2019-04-03 ENCOUNTER — Encounter: Payer: Self-pay | Admitting: Physical Therapy

## 2019-04-03 DIAGNOSIS — M25561 Pain in right knee: Secondary | ICD-10-CM | POA: Diagnosis not present

## 2019-04-03 DIAGNOSIS — M6281 Muscle weakness (generalized): Secondary | ICD-10-CM

## 2019-04-03 DIAGNOSIS — G8929 Other chronic pain: Secondary | ICD-10-CM

## 2019-04-03 DIAGNOSIS — R262 Difficulty in walking, not elsewhere classified: Secondary | ICD-10-CM

## 2019-04-03 DIAGNOSIS — R600 Localized edema: Secondary | ICD-10-CM

## 2019-04-03 DIAGNOSIS — M25661 Stiffness of right knee, not elsewhere classified: Secondary | ICD-10-CM

## 2019-04-03 DIAGNOSIS — M25571 Pain in right ankle and joints of right foot: Secondary | ICD-10-CM

## 2019-04-03 NOTE — Therapy (Signed)
[facility] [address]  [place], [place], [address] Phone: [phone]   Fax:  [phone]  Physical Therapy Treatment  Patient Details  Name: [name] MRN: [id number] Date of Birth: 06/18/1931 Referring Provider (PT): [name], MD   PT Therapy Telehealth Visit:  I connected with [name] today at 10:16 am by [facility] video conference and verified that I am speaking with the correct person using two identifiers.  I discussed the limitations, risks, security and privacy concerns of performing an evaluation and management service by Webex and the availability of in person appointments.  I also discussed with the patient that there may be a patient responsible charge related to this service. The patient expressed understanding and agreed to proceed.    The patient's address was confirmed.  Identified to the patient that therapist is a licensed PT in the state of [place].  Verified phone # as [phone] to call in case of technical difficulties.     Encounter Date: 04/03/2019  PT End of Session - 04/03/19 1200    Visit Number  16    Number of Visits  18    Date for PT Re-Evaluation  05/01/19    Authorization Type  UHC Medicare    PT Start Time  1016    PT Stop Time  1057    PT Time Calculation (min)  41 min    Activity Tolerance  Patient tolerated treatment well    Behavior During Therapy  WFL for tasks assessed/performed       Past Medical History:  Diagnosis Date  . Arthropathy, unspecified, site unspecified    Dr. [name]...right clavicular head swelling 2003  . Colon polyp    colonoscopy 06-24-06 with no adenomatous change, hyperplasia  only  . Environmental allergies   . Healthcare maintenance    CPX 12-24-09, Td 1/05, pneumovax 2000 age 85, GYN [name] ([facility])  . Hyperlipidemia    target less than 160 > try off muscle aches 03-18-10  . Morbid obesity ([place])    ideal = 142.  target wt =  158 for BMI < 30  . Osteopenia    BMD 08-07-08 tspin - 1.2, left femur - .4, right femur +.2.  repeat 01-02-10  1.9    0.1   0.5  . Rhinitis, chronic    Dr. [name]...on immunotherapy since 5/08    Past Surgical History:  Procedure Laterality Date  . BUNIONECTOMY  1982   bilateral  . FOOT SURGERY  1970   bilateral 5th toes, bones removed  . GANGLION CYST EXCISION  2000   3rd finger right hand  . TUBAL LIGATION  1971    There were no vitals filed for this visit.  Subjective Assessment - 04/03/19 1017    Subjective  Feels that her knee is feeling a bit better, but has been having a little more back pain lately. Reports that she has gotten a couple exercises in since last session.    Pertinent History  chronic rhinitis, osteopenia, HLD, arthropathy, B 5th toe bone removal, B bunionectomy     Diagnostic tests  none recent    Patient Stated Goals  "I would not have any pain in my ankle"    Currently in Pain?  Yes    Pain Score  4     Pain Location  Knee    Pain Orientation  Right;Anterior    Pain Descriptors / Indicators  Hervey Ard  Pain Type  Chronic pain    Pain Score  5    Pain Location  Back    Pain Orientation  Left    Pain Descriptors / Indicators  Aching    Pain Type  Chronic pain                       [facility] Adult PT Treatment/Exercise - 04/03/19 0001      Knee/Hip Exercises: Supine   Short Arc Quad Sets  Strengthening;Right;1 set;10 reps    Short Arc Quad Sets Limitations  with folded pillow under knee   cues and demonstration for understanding of proper movement   Heel Slides  AAROM;Right;1 set;10 reps    Heel Slides Limitations  using belt; to tolerance; 10x3"    Bridges with Clamshell  Strengthening;Both;1 set;15 reps   red TB above knees   Straight Leg Raise with External Rotation  Strengthening;Right;1 set;10 reps    Straight Leg Raise with External Rotation Limitations  cues to maintain ER      Knee/Hip Exercises: Sidelying   Clams  x10  each side with red TB above knees             PT Education - 04/03/19 1159    Education Details  update to HEP; discussion on importance of integrating HEP into her schedule for improved compliance    Person(s) Educated  Patient    Methods  Explanation;Demonstration;Tactile cues;Verbal cues;Handout    Comprehension  Verbalized understanding;Returned demonstration       PT Short Term Goals - 04/03/19 1205      PT SHORT TERM GOAL #1   Title  Patient to be independent with initial HEP.    Time  2    Period  Weeks    Status  On-going   noncompliance at this time   Target Date  04/03/19        PT Long Term Goals - 04/03/19 1205      PT LONG TERM GOAL #1   Title  Patient to be independent with advanced HEP.    Time  6    Period  Weeks    Status  On-going   noncompliance at this time     PT LONG TERM GOAL #2   Title  Patient to demonstrate R ankle AROM WFL and without pain limiting.     Time  6    Period  Weeks    Status  Deferred   02/13/19: deferred d/t limitations of telehealth      PT LONG TERM GOAL #3   Title  Patient to demonstrate R knee AROM WFL and without pain limiting.     Time  6    Period  Weeks    Status  Deferred   02/13/19: deferred d/t limitations of telehealth     PT LONG TERM GOAL #4   Title  Patient to demonstrate B LE strength >=4+/5.    Time  6    Period  Weeks    Status  Deferred   02/13/19: deferred d/t limitations of telehealth     PT LONG TERM GOAL #5   Title  Patient to report tolerance of 1 hour of standing/walking without pain limiting.     Time  6    Period  Weeks    Status  Partially Met   reports 45 min - 1 hour before onset of pain     PT LONG TERM GOAL #6   Title  Patient to score <= 14.8 seconds on 5xSTS without use of UEs in order to improve LE muscle strength and power.     Time  6    Period  Weeks    Status  On-going   17.58 sec without UEs     PT LONG TERM GOAL #7   Title  Patient to demonstrate safe floor to  stand transfer independently and without cues.     Time  6    Period  Weeks    Status  Achieved   able to perform without cues and with good safety awareness     PT LONG TERM GOAL #8   Title  Patient to report 5 hours of sleep without R knee pain limiting.    Time  6    Period  Weeks    Status  On-going      PT LONG TERM GOAL  #9   TITLE  Patient to report compliance with walking total of 30 min/day 3x/week for improved fitness.    Time  6    Period  Weeks    Status  On-going            Plan - 04/03/19 1201    Clinical Impression Statement  Patient seen via telehealth. Reporting improvement in HEP compliance but admitting "I haven't been doing them as often as I should." Educated patient on importance of integrating HEP into her schedule for improved compliance and encouraged her and her family to exercise together. Patient agreeable. Worked on progressive LE strengthening ther-ex with intermittent cues for form. Patient performing all exercises to her tolerance, thus without increased pain at end of session. Demonstrated decreased ROM with heel slides, but encouraged patient to maintain a stretch for hopeful improvement in ROM. Updated HEP with exercises that were well-tolerated today. Patient reported understanding.    Clinical Impairments Affecting Rehab Potential  chronic rhinitis, osteopenia, HLD, arthropathy, B 5th toe bone removal, B bunionectomy     PT Frequency  Biweekly    PT Duration  6 weeks    PT Treatment/Interventions  ADLs/Self Care Home Management;Cryotherapy;Electrical Stimulation;Iontophoresis '4mg'$ /ml Dexamethasone;Functional mobility training;Stair training;Gait training;DME Instruction;Ultrasound;Moist Heat;Therapeutic activities;Therapeutic exercise;Balance training;Neuromuscular re-education;Patient/family education;Orthotic Fit/Training;Passive range of motion;Scar mobilization;Manual techniques;Dry needling;Energy conservation;Splinting;Taping;Vasopneumatic  Device    PT Next Visit Plan  progress LE strengthening and R knee ROM    Consulted and Agree with Plan of Care  Patient       Patient will benefit from skilled therapeutic intervention in order to improve the following deficits and impairments:  Decreased range of motion, Difficulty walking, Decreased activity tolerance, Pain, Decreased balance, Decreased scar mobility, Hypomobility, Impaired flexibility, Improper body mechanics, Postural dysfunction, Increased edema, Decreased strength  Visit Diagnosis: 1. Chronic pain of right knee   2. Stiffness of right knee, not elsewhere classified   3. Pain in right ankle and joints of right foot   4. Muscle weakness (generalized)   5. Difficulty in walking, not elsewhere classified   6. Localized edema        Problem List Patient Active Problem List   Diagnosis Date Noted  . Chest pain, musculoskeletal 06/14/2017  . Seasonal allergic conjunctivitis 07/13/2016  . Upper airway cough syndrome 06/29/2016  . Neck pain on left side 08/13/2015  . Other allergic rhinitis 05/23/2015  . Back pain with sciatica on L  08/18/2014  . Paresthesia of bilateral legs 06/11/2014  . Health care maintenance 12/17/2013  . Acute upper respiratory infection 01/14/2013  . Essential hypertension 08/03/2012  .  Osteoporosis 12/24/2009  . COLONIC POLYPS, HYPERPLASTIC 11/23/2007  . Hyperlipidemia 08/15/2007  . Morbid obesity ([place]) 08/15/2007  . RHINITIS, CHRONIC 08/15/2007  . Arthropathy 08/15/2007    [name], PT, DPT 04/03/19 12:09 PM   [facility] [address]  [place], [place], [address] Phone: [phone]   Fax:  [phone]  Name: [name] MRN: [id number] Date of Birth: 24-Oct-1930

## 2019-04-10 ENCOUNTER — Ambulatory Visit: Payer: Medicare Other | Admitting: Gastroenterology

## 2019-04-17 ENCOUNTER — Encounter: Payer: Self-pay | Admitting: Physical Therapy

## 2019-04-17 ENCOUNTER — Other Ambulatory Visit: Payer: Self-pay

## 2019-04-17 ENCOUNTER — Ambulatory Visit: Payer: Medicare Other | Attending: Orthopedic Surgery | Admitting: Physical Therapy

## 2019-04-17 DIAGNOSIS — G8929 Other chronic pain: Secondary | ICD-10-CM | POA: Insufficient documentation

## 2019-04-17 DIAGNOSIS — R600 Localized edema: Secondary | ICD-10-CM | POA: Diagnosis present

## 2019-04-17 DIAGNOSIS — M25571 Pain in right ankle and joints of right foot: Secondary | ICD-10-CM | POA: Diagnosis present

## 2019-04-17 DIAGNOSIS — M6281 Muscle weakness (generalized): Secondary | ICD-10-CM | POA: Insufficient documentation

## 2019-04-17 DIAGNOSIS — M25561 Pain in right knee: Secondary | ICD-10-CM | POA: Diagnosis present

## 2019-04-17 DIAGNOSIS — R262 Difficulty in walking, not elsewhere classified: Secondary | ICD-10-CM | POA: Insufficient documentation

## 2019-04-17 DIAGNOSIS — M25661 Stiffness of right knee, not elsewhere classified: Secondary | ICD-10-CM | POA: Diagnosis present

## 2019-04-17 NOTE — Therapy (Signed)
[facility] [address]  [place], [place], [address] Phone: [phone]   Fax:  [phone]  Physical Therapy Treatment  Patient Details  Name: [name] MRN: [id number] Date of Birth: 03/11/1931 Referring Provider (PT): [name], MD  Physical Therapy Telehealth Visit:  I connected with [name] today at 10:20 am by [facility] video conference and verified that I am speaking with the correct person using two identifiers.  I discussed the limitations, risks, security and privacy concerns of performing an evaluation and management service by Webex and the availability of in person appointments.  I also discussed with the patient that there may be a patient responsible charge related to this service. The patient expressed understanding and agreed to proceed.    The patient's address was confirmed.  Identified to the patient that therapist is a licensed PT in the state of [place].  Verified phone # as [phone] to call in case of technical difficulties.   Encounter Date: 04/17/2019  PT End of Session - 04/17/19 1204    Visit Number  17    Number of Visits  18    Date for PT Re-Evaluation  05/01/19    Authorization Type  UHC Medicare    PT Start Time  1020    PT Stop Time  1100    PT Time Calculation (min)  40 min    Activity Tolerance  Patient tolerated treatment well    Behavior During Therapy  WFL for tasks assessed/performed       Past Medical History:  Diagnosis Date  . Arthropathy, unspecified, site unspecified    Dr. [name]...right clavicular head swelling 2003  . Colon polyp    colonoscopy 06-24-06 with no adenomatous change, hyperplasia  only  . Environmental allergies   . Healthcare maintenance    CPX 12-24-09, Td 1/05, pneumovax 2000 age 54, GYN [name] ([facility])  . Hyperlipidemia    target less than 160 > try off muscle aches 03-18-10  . Morbid obesity ([place])    ideal = 142.  target wt = 158  for BMI < 30  . Osteopenia    BMD 08-07-08 tspin - 1.2, left femur - .4, right femur +.2.  repeat 01-02-10  1.9    0.1   0.5  . Rhinitis, chronic    Dr. [name]...on immunotherapy since 5/08    Past Surgical History:  Procedure Laterality Date  . BUNIONECTOMY  1982   bilateral  . FOOT SURGERY  1970   bilateral 5th toes, bones removed  . GANGLION CYST EXCISION  2000   3rd finger right hand  . TUBAL LIGATION  1971    There were no vitals filed for this visit.  Subjective Assessment - 04/17/19 1008    Subjective  Reports that she is feeling better. Having less knee pain as she is moving more.    Pertinent History  chronic rhinitis, osteopenia, HLD, arthropathy, B 5th toe bone removal, B bunionectomy     Diagnostic tests  none recent    Patient Stated Goals  "I would not have any pain in my ankle"    Currently in Pain?  Yes    Pain Score  3     Pain Location  Knee    Pain Orientation  Right;Anterior    Pain Descriptors / Indicators  Sharp    Pain Type  Chronic pain  [facility] Adult PT Treatment/Exercise - 04/17/19 0001      Self-Care   Self-Care  Other Self-Care Comments    Other Self-Care Comments   edu and demonstration of R knee patellar mobs in M/L and superior/inferior directions       Knee/Hip Exercises: Stretches   Personal assistant reps;30 seconds    Quad Stretch Limitations  with strap to tolerance   heavy instruction on exercise form     Knee/Hip Exercises: Seated   Long Arc Quad  Strengthening;Right;1 set;10 reps    Long Arc Quad Limitations  3 sec hold and 3 sec lower      Knee/Hip Exercises: Supine   Heel Slides  AAROM;Right;1 set;10 reps    Heel Slides Limitations  using belt; to tolerance; 10x5"      Knee/Hip Exercises: Prone   Hip Extension  Strengthening;Right;Left;1 set;10 reps    Hip Extension Limitations  heavy cues to maintain straight knee             PT Education - 04/17/19 1101    Education  Details  update to HEP; discussion on possible transition to HHPT    Person(s) Educated  Patient    Methods  Explanation;Demonstration;Tactile cues;Handout;Verbal cues    Comprehension  Verbalized understanding;Returned demonstration       PT Short Term Goals - 04/03/19 1205      PT SHORT TERM GOAL #1   Title  Patient to be independent with initial HEP.    Time  2    Period  Weeks    Status  On-going   noncompliance at this time   Target Date  04/03/19        PT Long Term Goals - 04/03/19 1205      PT LONG TERM GOAL #1   Title  Patient to be independent with advanced HEP.    Time  6    Period  Weeks    Status  On-going   noncompliance at this time     PT LONG TERM GOAL #2   Title  Patient to demonstrate R ankle AROM WFL and without pain limiting.     Time  6    Period  Weeks    Status  Deferred   02/13/19: deferred d/t limitations of telehealth      PT LONG TERM GOAL #3   Title  Patient to demonstrate R knee AROM WFL and without pain limiting.     Time  6    Period  Weeks    Status  Deferred   02/13/19: deferred d/t limitations of telehealth     PT LONG TERM GOAL #4   Title  Patient to demonstrate B LE strength >=4+/5.    Time  6    Period  Weeks    Status  Deferred   02/13/19: deferred d/t limitations of telehealth     PT LONG TERM GOAL #5   Title  Patient to report tolerance of 1 hour of standing/walking without pain limiting.     Time  6    Period  Weeks    Status  Partially Met   reports 45 min - 1 hour before onset of pain     PT LONG TERM GOAL #6   Title  Patient to score <= 14.8 seconds on 5xSTS without use of UEs in order to improve LE muscle strength and power.     Time  6    Period  Weeks    Status  On-going   17.58 sec without UEs     PT LONG TERM GOAL #7   Title  Patient to demonstrate safe floor to stand transfer independently and without cues.     Time  6    Period  Weeks    Status  Achieved   able to perform without cues and with  good safety awareness     PT LONG TERM GOAL #8   Title  Patient to report 5 hours of sleep without R knee pain limiting.    Time  6    Period  Weeks    Status  On-going      PT LONG TERM GOAL  #9   TITLE  Patient to report compliance with walking total of 30 min/day 3x/week for improved fitness.    Time  6    Period  Weeks    Status  On-going            Plan - 04/17/19 1205    Clinical Impression Statement  Patient seen via telehealth today. Reports that she has been having less pain in the R knee recently, and attributes this to being more active and performing more of her HEP since last session. Instructed patient on self-patellar mobilizations for hopeful improvement in knee ROM. Followed with heel slides to patient's tolerance. Patient with difficulty performing prone hip extension d/t tendency to bend knee despite cues to maintain TKE. Demonstrated observable improvement in knee extension with LAQ and able to control eccentric phase well. Updated HEP with exercises to work on until next session and discussed possibility of transferring patient to [place]. D/t the patient and her husband's age and health, they are continuing to self-quarantine and have not left their house since March. Patient agreeable to this idea. Plan to DC and likely recommend HHPT at next session.    Clinical Impairments Affecting Rehab Potential  chronic rhinitis, osteopenia, HLD, arthropathy, B 5th toe bone removal, B bunionectomy     PT Frequency  Biweekly    PT Duration  6 weeks    PT Treatment/Interventions  ADLs/Self Care Home Management;Cryotherapy;Electrical Stimulation;Iontophoresis '4mg'$ /ml Dexamethasone;Functional mobility training;Stair training;Gait training;DME Instruction;Ultrasound;Moist Heat;Therapeutic activities;Therapeutic exercise;Balance training;Neuromuscular re-education;Patient/family education;Orthotic Fit/Training;Passive range of motion;Scar mobilization;Manual techniques;Dry needling;Energy  conservation;Splinting;Taping;Vasopneumatic Device    PT Next Visit Plan  progress LE strengthening and R knee ROM    Consulted and Agree with Plan of Care  Patient       Patient will benefit from skilled therapeutic intervention in order to improve the following deficits and impairments:  Decreased range of motion, Difficulty walking, Decreased activity tolerance, Pain, Decreased balance, Decreased scar mobility, Hypomobility, Impaired flexibility, Improper body mechanics, Postural dysfunction, Increased edema, Decreased strength  Visit Diagnosis: 1. Chronic pain of right knee   2. Stiffness of right knee, not elsewhere classified   3. Pain in right ankle and joints of right foot   4. Muscle weakness (generalized)   5. Difficulty in walking, not elsewhere classified   6. Localized edema        Problem List Patient Active Problem List   Diagnosis Date Noted  . Chest pain, musculoskeletal 06/14/2017  . Seasonal allergic conjunctivitis 07/13/2016  . Upper airway cough syndrome 06/29/2016  . Neck pain on left side 08/13/2015  . Other allergic rhinitis 05/23/2015  . Back pain with sciatica on L  08/18/2014  . Paresthesia of bilateral legs 06/11/2014  . Health care maintenance 12/17/2013  . Acute upper respiratory infection 01/14/2013  . Essential hypertension 08/03/2012  .  Osteoporosis 12/24/2009  . COLONIC POLYPS, HYPERPLASTIC 11/23/2007  . Hyperlipidemia 08/15/2007  . Morbid obesity ([place]) 08/15/2007  . RHINITIS, CHRONIC 08/15/2007  . Arthropathy 08/15/2007     [name], PT, DPT 04/17/19 12:18 PM   [facility] [address]  [place], [place], [address] Phone: [phone]   Fax:  [phone]  Name: [name] MRN: [id number] Date of Birth: 09-11-1930

## 2019-04-25 ENCOUNTER — Telehealth: Payer: Self-pay | Admitting: Internal Medicine

## 2019-04-25 NOTE — Telephone Encounter (Signed)
The shingles shot is an improvement and does not cause shingle to my knowledge  But we do not provide it thru this office - she can check with her pharmacy to see if they have it and fine with me if she takes it (or not, it's optional)

## 2019-04-25 NOTE — Telephone Encounter (Signed)
Called and spoke w/ pt regarding her request for a flu vaccine at the [place] location. I let pt know that we are no longer located at [facility] and that she would have to come to our new office off [address] in order to receive a flu vaccine from [name]. Pt expressed understanding and agreed to setting up an inj appointment for 05/01/2019 at 3:00 PM EDT. Appt has been scheduled.   Pt had an additional question for MW regarding the Shingles vaccine. She is wondering if she is a candidate for the Shingles vaccine. She states she has heard that the Shingles vaccine has been giving individuals Shingles and is weary about getting it herself.   MW, please advise with your recommendations for this pt. Thank you.

## 2019-04-25 NOTE — Telephone Encounter (Signed)
Called and spoke with Patient.  Dr [name] recommendations given.  Understanding stated.  Nothing further at this time.

## 2019-05-01 ENCOUNTER — Encounter: Payer: Self-pay | Admitting: Physical Therapy

## 2019-05-01 ENCOUNTER — Encounter: Payer: Self-pay | Admitting: Gastroenterology

## 2019-05-01 ENCOUNTER — Ambulatory Visit (INDEPENDENT_AMBULATORY_CARE_PROVIDER_SITE_OTHER): Payer: Medicare Other

## 2019-05-01 ENCOUNTER — Other Ambulatory Visit: Payer: Self-pay

## 2019-05-01 ENCOUNTER — Ambulatory Visit: Payer: Medicare Other | Admitting: Physical Therapy

## 2019-05-01 ENCOUNTER — Ambulatory Visit (INDEPENDENT_AMBULATORY_CARE_PROVIDER_SITE_OTHER): Payer: Medicare Other | Admitting: Gastroenterology

## 2019-05-01 VITALS — BP 110/60 | HR 86 | Temp 98.2°F | Ht 62.0 in | Wt 148.5 lb

## 2019-05-01 DIAGNOSIS — R1310 Dysphagia, unspecified: Secondary | ICD-10-CM

## 2019-05-01 DIAGNOSIS — M6281 Muscle weakness (generalized): Secondary | ICD-10-CM

## 2019-05-01 DIAGNOSIS — Z23 Encounter for immunization: Secondary | ICD-10-CM | POA: Diagnosis not present

## 2019-05-01 DIAGNOSIS — R262 Difficulty in walking, not elsewhere classified: Secondary | ICD-10-CM

## 2019-05-01 DIAGNOSIS — R600 Localized edema: Secondary | ICD-10-CM

## 2019-05-01 DIAGNOSIS — K5900 Constipation, unspecified: Secondary | ICD-10-CM

## 2019-05-01 DIAGNOSIS — G8929 Other chronic pain: Secondary | ICD-10-CM

## 2019-05-01 DIAGNOSIS — M25661 Stiffness of right knee, not elsewhere classified: Secondary | ICD-10-CM

## 2019-05-01 DIAGNOSIS — M25561 Pain in right knee: Secondary | ICD-10-CM | POA: Diagnosis not present

## 2019-05-01 DIAGNOSIS — R1013 Epigastric pain: Secondary | ICD-10-CM

## 2019-05-01 DIAGNOSIS — M25571 Pain in right ankle and joints of right foot: Secondary | ICD-10-CM

## 2019-05-01 MED ORDER — PANTOPRAZOLE SODIUM 40 MG PO TBEC: 40.0000 mg | DELAYED_RELEASE_TABLET | Freq: Every day | ORAL | 5 refills | Status: DC

## 2019-05-01 NOTE — Progress Notes (Signed)
05/08/2019 [name] [id number] 09-02-1931   HISTORY OF PRESENT ILLNESS: This is a pleasant 85 year old female who is previously a patient of Dr. Nichola Sizer.  Her care will be assumed by Dr. Rush Landmark.  She presents to our office today with complaints of upper abdominal pain mostly with eating, but occurs at other times as well.  Symptoms have been present for about the past 3-5 months.  She also describes a discomfort and sensation of fullness in her chest.  Says that when she is eating she feels full and very stuffed quickly.  Says that it almost feels like food gets stuck.  She has postnasal drip and initially contributed her symptoms to that.  Was wondering if it could be due to acid reflux as well.  Has used pepcid on occasion, which maybe helps temporarily.  Reports about a 5 pound weight loss since this all began.  She also reports some constipation.  Has used MiraLAX on occasion, but nothing on a regular basis.  Had colonoscopy in February 2013 at which time she had a sessile polyp removed that was hyperplastic on pathology.  Study was otherwise unremarkable.  She had an EGD in February 2005 that was normal with essentially normal distal esophageal biopsies.  Recent BMP and hepatic function panel within normal limits.  TSH normal November 2019.  Past Medical History:  Diagnosis Date  . Arthropathy, unspecified, site unspecified    Dr. [name]...right clavicular head swelling 2003  . Colon polyp    colonoscopy 06-24-06 with no adenomatous change, hyperplasia  only  . Environmental allergies   . Healthcare maintenance    CPX 12-24-09, Td 1/05, pneumovax 2000 age 39, GYN [name] ([place])  . Hyperlipidemia    target less than 160 > try off muscle aches 03-18-10  . Morbid obesity ([employer])    ideal = 142.  target wt = 158 for BMI < 30  . Osteopenia    BMD 08-07-08 tspin - 1.2, left femur - .4, right femur +.2.  repeat 01-02-10  1.9    0.1   0.5  . Rhinitis, chronic    Dr.  [name]...on immunotherapy since 5/08   Past Surgical History:  Procedure Laterality Date  . BUNIONECTOMY  1982   bilateral  . FOOT SURGERY  1970   bilateral 5th toes, bones removed  . GANGLION CYST EXCISION  2000   3rd finger right hand  . TUBAL LIGATION  1971    reports that she quit smoking about 63 years ago. Her smoking use included cigarettes. She has a 2.50 pack-year smoking history. She has never used smokeless tobacco. She reports that she does not drink alcohol or use drugs. family history includes Colon cancer in her mother; Healthy in her daughter and son. No Known Allergies    Outpatient Encounter Medications as of 05/01/2019  Medication Sig  . acetaminophen (TYLENOL) 500 MG tablet Take 500 mg by mouth every 6 (six) hours as needed.  . cetirizine (ZYRTEC) 10 MG tablet Take 10 mg by mouth at bedtime as needed (drippy nose, drainage, sneezing).   . fluticasone (FLONASE) 50 MCG/ACT nasal spray Place 1 spray into both nostrils 2 (two) times daily as needed for allergies or rhinitis.  . hydrocortisone 2.5 % cream Apply 1 application topically 2 (two) times daily.  . simvastatin (ZOCOR) 40 MG tablet Take 1 tablet (40 mg total) by mouth daily.  . sulindac (CLINORIL) 200 MG tablet Take 1 tablet (200 mg total) by mouth 2 (  two) times daily.  . Vitamin D, Cholecalciferol, 400 units TABS Take by mouth.  . pantoprazole (PROTONIX) 40 MG tablet Take 1 tablet (40 mg total) by mouth daily.  . [DISCONTINUED] Sodium Chloride-Sodium Bicarb (AYR SALINE NASAL RINSE NA) Rinses as needed for nasal congestion   No facility-administered encounter medications on file as of 05/01/2019.      REVIEW OF SYSTEMS  : All other systems reviewed and negative except where noted in the History of Present Illness.   PHYSICAL EXAM: BP 110/60 (BP Location: Right Arm, Patient Position: Sitting, Cuff Size: Normal)   Pulse 86   Temp 98.2 F (36.8 C)   Ht 5\' 2"  (1.575 m)   Wt 148 lb 8 oz (67.4 kg)    BMI 27.16 kg/m  General: Well developed black female in no acute distress Head: Normocephalic and atraumatic Eyes:  Sclerae anicteric, conjunctiva pink. Ears: Normal auditory acuity Lungs: Clear throughout to auscultation; no increased WOB. Heart: Regular rate and rhythm; no M/R/G. Abdomen: Soft, non-distended.  BS present.  Non-tender. Musculoskeletal: Symmetrical with no gross deformities  Skin: No lesions on visible extremities Extremities: No edema  Neurological: Alert oriented x 4, grossly non-focal Psychological:  Alert and cooperative. Normal mood and affect  ASSESSMENT AND PLAN: *Dysphagia, epigastric abdominal pain:  We discussed endoscopy but understandably due to her age the patient wanted to discuss other means of evaluation first, less invasive/more conservative approach.  We agreed upon UGI series.  Will also have her start pantoprazole 40 mg daily. *Constipation:  Will start with Miralax 1/2 capful daily and increase to full dose if needed.   CC:  [name], MD

## 2019-05-01 NOTE — Therapy (Signed)
[facility] [address]  [place], [place], [address] Phone: [phone]   Fax:  [phone]  Physical Therapy Discharge Summary  Patient Details  Name: [name] MRN: [id number] Date of Birth: [birth year] Referring Provider (PT): [name], MD   PT Therapy Telehealth Visit:  I connected with [name] today at 10:16am by Webex video conference and verified that I am speaking with the correct person using two identifiers.  I discussed the limitations, risks, security and privacy concerns of performing an evaluation and management service by Webex and the availability of in person appointments.  I also discussed with the patient that there may be a patient responsible charge related to this service. The patient expressed understanding and agreed to proceed.    The patient's address was confirmed.  Identified to the patient that therapist is a licensed PT in the state of [place].  Verified phone # as [phone] to call in case of technical difficulties.   Progress Note Reporting Period 01/09/19 to 05/01/19  See note below for Objective Data and Assessment of Progress/Goals.     Encounter Date: 05/01/2019  PT End of Session - 05/01/19 1200    Visit Number  18    Number of Visits  18    Date for PT Re-Evaluation  05/01/19    Authorization Type  [employer] Medicare    PT Start Time  1016    PT Stop Time  1103    PT Time Calculation (min)  47 min    Activity Tolerance  Patient tolerated treatment well    Behavior During Therapy  WFL for tasks assessed/performed       Past Medical History:  Diagnosis Date  . Arthropathy, unspecified, site unspecified    Dr. [name]...right clavicular head swelling 2003  . Colon polyp    colonoscopy 06-24-06 with no adenomatous change, hyperplasia  only  . Environmental allergies   . Healthcare maintenance    CPX 12-24-09, Td 1/05, pneumovax 2000 age 85, [name] ([facility])  . Hyperlipidemia    target less than 160 > try off muscle aches 03-18-10  . Morbid obesity ([place])    ideal = 142.  target wt = 158 for BMI < 30  . Osteopenia    BMD 08-07-08 tspin - 1.2, left femur - .4, right femur +.2.  repeat 01-02-10  1.9    0.1   0.5  . Rhinitis, chronic    Dr. [name]...on immunotherapy since 5/08    Past Surgical History:  Procedure Laterality Date  . BUNIONECTOMY  1982   bilateral  . FOOT SURGERY  1970   bilateral 5th toes, bones removed  . GANGLION CYST EXCISION  2000   3rd finger right hand  . TUBAL LIGATION  1971    There were no vitals filed for this visit.  Subjective Assessment - 05/01/19 1201    Subjective  Reports that she is walking with her husband daily and is performing her HEP, albeit not as often as she'd like. Is still interested in performing HHPT to continue working towards her R knee strength, ROM, and sleeping tolerance.    Pertinent History  chronic rhinitis, osteopenia, HLD, arthropathy, B 5th toe bone removal, B bunionectomy     Patient Stated Goals  "I would not have any pain in my ankle"    Currently in Pain?  Yes    Pain Score  3     Pain  Location  Knee    Pain Orientation  Right    Pain Descriptors / Indicators  Dull   stiff   Pain Type  Chronic pain         [facility] PT Assessment - 05/01/19 0001      Standardized Balance Assessment   Standardized Balance Assessment  Five Times Sit to Stand    Five times sit to stand comments   1st trial 15.31 sec without UE use, 2nd trial 12.23 sec                   [facility] Adult PT Treatment/Exercise - 05/01/19 0001      Knee/Hip Exercises: Seated   Other Seated Knee/Hip Exercises  sitting resisted marching with red TB around toes x10    Sit to Sand  2 sets;5 reps;without UE support   2nd set with red TB above knees     Knee/Hip Exercises: Supine   Heel Slides  AAROM;Right;1 set;10 reps    Heel Slides Limitations  using belt; to tolerance; 10x5"              PT Education - 05/01/19 1159    Education Details  update to HEP; discussion on objective progress with PT, discussion on potential benefits of HHPT, education on walking program    Person(s) Educated  Patient    Methods  Explanation;Demonstration;Tactile cues;Verbal cues;Handout    Comprehension  Verbalized understanding;Returned demonstration       PT Short Term Goals - 05/01/19 1204      PT SHORT TERM GOAL #1   Title  Patient to be independent with initial HEP.    Time  2    Period  Weeks    Status  Partially Met   reports intermittent compliance   Target Date  04/03/19        PT Long Term Goals - 05/01/19 1204      PT LONG TERM GOAL #1   Title  Patient to be independent with advanced HEP.    Time  6    Period  Weeks    Status  Partially Met   reports intermittent compliance     PT LONG TERM GOAL #2   Title  Patient to demonstrate R ankle AROM WFL and without pain limiting.     Time  6    Period  Weeks    Status  Deferred   deferred d/t limitations of telehealth     PT LONG TERM GOAL #3   Title  Patient to demonstrate R knee AROM WFL and without pain limiting.     Time  6    Period  Weeks    Status  Deferred   deferred d/t limitations of telehealth     PT LONG TERM GOAL #4   Title  Patient to demonstrate B LE strength >=4+/5.    Time  6    Period  Weeks    Status  Deferred   deferred d/t limitations of telehealth     PT LONG TERM GOAL #5   Title  Patient to report tolerance of 83 hour of standing/walking without pain limiting.     Time  6    Period  Weeks    Status  Partially Met   reports 105mn before onset of pain     PT LONG TERM GOAL #6   Title  Patient to score <= 14.8 seconds on 5xSTS without use of UEs in order to improve LE muscle strength  and power.     Time  6    Period  Weeks    Status  Achieved   averaging 13.77 sec between two trials today     PT LONG TERM GOAL #7   Title  Patient to demonstrate safe floor to stand  transfer independently and without cues.     Time  6    Period  Weeks    Status  Achieved   able to perform without cues and with good safety awareness     PT LONG TERM GOAL #8   Title  Patient to report 5 hours of sleep without R knee pain limiting.    Time  6    Period  Weeks    Status  Partially Met   reports tolerance of 2-3 hours of sleep before waking up d/t R knee pain     PT LONG TERM GOAL  #9   TITLE  Patient to report compliance with walking total of 30 min/day 3x/week for improved fitness.    Time  6    Period  Weeks    Status  Partially Met   reports walking 15-20 min daily           Plan - 05/01/19 1208    Clinical Impression Statement  Patient seen via telehealth for last PT session. Patient would like to wrap up with OPPT at this time, with hopeful transition to [facility]. Patient and her husband have been in self-quarantine since about March d/t COVID-19. D/t technological difficulties and difficulty with carryover, believe patient would benefit from hands-on therapy to improve her understanding of exercises. Also advised patient to F/U with her Orthopedist for further work up of her knee. Patient agreeable. Patient has recently been more compliant with HEP and is now walking daily. Feels that her knee is getting better. Patient has met 5xSTS goal today, averaging 13.77 sec between two trials today. Still limited in functional activity tolerance, with only 30 min on her feet before onset of pain. Also limited in sleeping tolerance to 2-3 hours d/t R knee pain. Educated patient on consolidated HEP to continue fitness regimen. Patient reported understanding and with no complaints at end of session. Patient discharged at this time, with recommendation for HHPT d/t patient quarantining herself d/t her and her husband's age and poor health.    Clinical Impairments Affecting Rehab Potential  chronic rhinitis, osteopenia, HLD, arthropathy, B 5th toe bone removal, B bunionectomy      PT Frequency  Biweekly    PT Duration  6 weeks    PT Treatment/Interventions  ADLs/Self Care Home Management;Cryotherapy;Electrical Stimulation;Iontophoresis 39m/ml Dexamethasone;Functional mobility training;Stair training;Gait training;DME Instruction;Ultrasound;Moist Heat;Therapeutic activities;Therapeutic exercise;Balance training;Neuromuscular re-education;Patient/family education;Orthotic Fit/Training;Passive range of motion;Scar mobilization;Manual techniques;Dry needling;Energy conservation;Splinting;Taping;Vasopneumatic Device    PT Next Visit Plan  DC at this time    Consulted and Agree with Plan of Care  Patient       Patient will benefit from skilled therapeutic intervention in order to improve the following deficits and impairments:  Decreased range of motion, Difficulty walking, Decreased activity tolerance, Pain, Decreased balance, Decreased scar mobility, Hypomobility, Impaired flexibility, Improper body mechanics, Postural dysfunction, Increased edema, Decreased strength  Visit Diagnosis: Chronic pain of right knee  Stiffness of right knee, not elsewhere classified  Pain in right ankle and joints of right foot  Muscle weakness (generalized)  Difficulty in walking, not elsewhere classified  Localized edema     Problem List Patient Active Problem List   Diagnosis Date Noted  .  Chest pain, musculoskeletal 06/14/2017  . Seasonal allergic conjunctivitis 07/13/2016  . Upper airway cough syndrome 06/29/2016  . Neck pain on left side 08/13/2015  . Other allergic rhinitis 05/23/2015  . Back pain with sciatica on L  08/18/2014  . Paresthesia of bilateral legs 06/11/2014  . Health care maintenance 12/17/2013  . Acute upper respiratory infection 01/14/2013  . Essential hypertension 08/03/2012  . Osteoporosis 12/24/2009  . COLONIC POLYPS, HYPERPLASTIC 11/23/2007  . Hyperlipidemia 08/15/2007  . Morbid obesity ([place]) 08/15/2007  . RHINITIS, CHRONIC 08/15/2007  .  Arthropathy 08/15/2007    PHYSICAL THERAPY DISCHARGE SUMMARY  Visits from Start of Care: 18  Current functional level related to goals / functional outcomes: See above clinical impression   Remaining deficits: Decreased functional activity tolerance, poor sleep and standing/walking tolerance    Education / Equipment: HEP  Plan: Patient agrees to discharge.  Patient goals were partially met. Patient is being discharged due to being pleased with the current functional level.  ?????     [name], PT, DPT 05/01/19 12:24 PM  [facility] [address]  [place], [place], [address] Phone: [phone]   Fax:  [phone]  Name: NALANI ANDREEN MRN: [id number] Date of Birth: 22-May-1931

## 2019-05-01 NOTE — Progress Notes (Signed)
Patient came to office today for flu vaccine.  Patient vaccine information given.  Patient received high dose flu vaccine.  Nothing further at this time.

## 2019-05-01 NOTE — Patient Instructions (Signed)
We have sent the following medications to your pharmacy for you to pick up at your convenience: pantoprazole.   You can take over the counter Miralax mixing 1/2 dose daily and can increase to a full dose if needed.   You have been scheduled for an Upper GI Series at [facility]. Your appointment is on 05/10/19 at 10:30am. Please arrive 15 minutes prior to your test for registration. Make sure not to eat or drink anything after midnight on the night before your test. If you need to reschedule, please call radiology at [phone]. ________________________________________________________________ An upper GI series uses x rays to help diagnose problems of the upper GI tract, which includes the esophagus, stomach, and duodenum. The duodenum is the first part of the small intestine. An upper GI series is conducted by a radiology technologist or a radiologist-a doctor who specializes in x-ray imaging-at a hospital or outpatient center. While sitting or standing in front of an x-ray machine, the patient drinks barium liquid, which is often white and has a chalky consistency and taste. The barium liquid coats the lining of the upper GI tract and makes signs of disease show up more clearly on x rays. X-ray video, called fluoroscopy, is used to view the barium liquid moving through the esophagus, stomach, and duodenum. Additional x rays and fluoroscopy are performed while the patient lies on an x-ray table. To fully coat the upper GI tract with barium liquid, the technologist or radiologist may press on the abdomen or ask the patient to change position. Patients hold still in various positions, allowing the technologist or radiologist to take x rays of the upper GI tract at different angles. If a technologist conducts the upper GI series, a radiologist will later examine the images to look for problems.  This test typically takes about 1 hour to  complete. __________________________________________________________________

## 2019-05-08 ENCOUNTER — Encounter: Payer: Self-pay | Admitting: Gastroenterology

## 2019-05-08 DIAGNOSIS — R1013 Epigastric pain: Secondary | ICD-10-CM | POA: Insufficient documentation

## 2019-05-08 DIAGNOSIS — R1319 Other dysphagia: Secondary | ICD-10-CM | POA: Insufficient documentation

## 2019-05-08 DIAGNOSIS — K5900 Constipation, unspecified: Secondary | ICD-10-CM | POA: Insufficient documentation

## 2019-05-08 NOTE — Progress Notes (Signed)
Attending Physician's Attestation   I have reviewed the chart.   I agree with the Advanced Practitioner's note, impression, and recommendations with any updates as below.  If barium swallow is unremarkable which continues to have symptoms while on PPI then agree that upper endoscopy is reasonable.  Based on some symptoms of possible early satiety may consider the role of a gastric emptying study in future.  Justice Britain, MD [facility] Advanced Endoscopy Office # [id number]

## 2019-05-10 ENCOUNTER — Other Ambulatory Visit: Payer: Self-pay | Admitting: Gastroenterology

## 2019-05-10 ENCOUNTER — Other Ambulatory Visit: Payer: Self-pay

## 2019-05-10 ENCOUNTER — Ambulatory Visit (HOSPITAL_COMMUNITY)
Admission: RE | Admit: 2019-05-10 | Discharge: 2019-05-10 | Disposition: A | Discharge status: Home / Self Care | Payer: Medicare Other | Source: Ambulatory Visit | Attending: Gastroenterology | Admitting: Gastroenterology

## 2019-05-10 DIAGNOSIS — R1013 Epigastric pain: Secondary | ICD-10-CM | POA: Insufficient documentation

## 2019-05-10 DIAGNOSIS — R1310 Dysphagia, unspecified: Secondary | ICD-10-CM | POA: Diagnosis present

## 2019-06-06 ENCOUNTER — Encounter: Payer: Self-pay | Admitting: Gastroenterology

## 2019-06-06 ENCOUNTER — Other Ambulatory Visit: Payer: Self-pay

## 2019-06-06 ENCOUNTER — Ambulatory Visit (INDEPENDENT_AMBULATORY_CARE_PROVIDER_SITE_OTHER): Payer: Medicare Other | Admitting: Gastroenterology

## 2019-06-06 VITALS — BP 158/68 | HR 72 | Temp 97.8°F | Ht 62.0 in | Wt 149.9 lb

## 2019-06-06 DIAGNOSIS — R1319 Other dysphagia: Secondary | ICD-10-CM

## 2019-06-06 DIAGNOSIS — K219 Gastro-esophageal reflux disease without esophagitis: Secondary | ICD-10-CM

## 2019-06-06 NOTE — Patient Instructions (Signed)
If you are age 83 or older, your body mass index should be between 23-30. Your Body mass index is 27.42 kg/m. If this is out of the aforementioned range listed, please consider follow up with your Primary Care Provider.  If you are age 45 or younger, your body mass index should be between 19-25. Your Body mass index is 27.42 kg/m. If this is out of the aformentioned range listed, please consider follow up with your Primary Care Provider.   Continue Pantoprazole daily.  Follow up as needed.  Thank you for choosing me and [facility].   Alonza Bogus, PA-C

## 2019-06-06 NOTE — Progress Notes (Signed)
06/06/2019 [name] [id number] August 13, 1931   HISTORY OF PRESENT ILLNESS:  This is a pleasant 85 year old female who was seen by me on 05/01/2019 for complaints of dysphagia.  She had an UGI performed and that was normal.  I placed her on pantoprazole 40 mg daily, which she has been taking every day.  She says that her symptoms have completely resolved, is able to eat about anything that she wants.  Very satisfied with results.  No complaints today.   Past Medical History:  Diagnosis Date  . Arthropathy, unspecified, site unspecified    Dr. Patrice [name]...right clavicular head swelling 2003  . Colon polyp    colonoscopy 06-24-06 with no adenomatous change, hyperplasia  only  . Environmental allergies   . Healthcare maintenance    CPX 12-24-09, Td 1/05, pneumovax 2000 age 83, GYN [name] ([facility])  . Hyperlipidemia    target less than 160 > try off muscle aches 03-18-10  . Morbid obesity ([employer])    ideal = 142.  target wt = 158 for BMI < 30  . Osteopenia    BMD 08-07-08 tspin - 1.2, left femur - .4, right femur +.2.  repeat 01-02-10  1.9    0.1   0.5  . Rhinitis, chronic    Dr. Orlean [name]...on immunotherapy since 5/08   Past Surgical History:  Procedure Laterality Date  . BUNIONECTOMY  1982   bilateral  . FOOT SURGERY  1970   bilateral 5th toes, bones removed  . GANGLION CYST EXCISION  2000   3rd finger right hand  . TUBAL LIGATION  1971    reports that she quit smoking about 63 years ago. Her smoking use included cigarettes. She has a 2.50 pack-year smoking history. She has never used smokeless tobacco. She reports that she does not drink alcohol or use drugs. family history includes Colon cancer in her mother; Healthy in her daughter and son. No Known Allergies    Outpatient Encounter Medications as of 06/06/2019  Medication Sig  . acetaminophen (TYLENOL) 500 MG tablet Take 500 mg by mouth every 6 (six) hours as needed.  . cetirizine (ZYRTEC) 10 MG tablet Take 10 mg by  mouth at bedtime as needed (drippy nose, drainage, sneezing).   . clotrimazole-betamethasone (LOTRISONE) cream APPLY TWICE DAILY TO VULVA FOR 3 TO 5 DAYS.  . fluticasone (FLONASE) 50 MCG/ACT nasal spray Place 1 spray into both nostrils 2 (two) times daily as needed for allergies or rhinitis.  . hydrocortisone 2.5 % cream Apply 1 application topically 2 (two) times daily.  . pantoprazole (PROTONIX) 40 MG tablet Take 1 tablet (40 mg total) by mouth daily.  . simvastatin (ZOCOR) 40 MG tablet Take 1 tablet (40 mg total) by mouth daily.  . sulindac (CLINORIL) 200 MG tablet Take 1 tablet (200 mg total) by mouth 2 (two) times daily.  . Vitamin D, Cholecalciferol, 400 units TABS Take by mouth.   No facility-administered encounter medications on file as of 06/06/2019.      REVIEW OF SYSTEMS  : All other systems reviewed and negative except where noted in the History of Present Illness.   PHYSICAL EXAM: BP (!) 180/70   Pulse 72   Temp 97.8 F (36.6 C)   Ht 5\' 2"  (1.575 m)   Wt 149 lb 14.4 oz (68 kg)   BMI 27.42 kg/m  General: Well developed white female in no acute distress Head: Normocephalic and atraumatic Eyes:  Sclerae anicteric, conjunctiva pink. Ears: Normal  auditory acuity Lungs: Clear throughout to auscultation Heart: Regular rate and rhythm Abdomen: Soft, non-distended. Normal bowel sounds.  Non-tender. Musculoskeletal: Symmetrical with no gross deformities  Skin: No lesions on visible extremities Extremities: No edema  Neurological: Alert oriented x 4, grossly non-focal Psychological:  Alert and cooperative. Normal mood and affect  ASSESSMENT AND PLAN: *Dysphagia/GERD:  UGI was normal.  Symptoms have completely resolved on pantoprazole 40 mg daily.  Will continue.  Follow-up prn for any other issues.   CC:  Mackenzie Rockers, MD

## 2019-06-07 NOTE — Progress Notes (Signed)
Attending Physician's Attestation   I have reviewed the chart.   I agree with the Advanced Practitioner's note, impression, and recommendations with any updates as below.  I am glad to hear that she is doing better.  If at any time point her symptoms recur while on current therapy or as we start decreasing her therapy, it may be worthwhile to proceed with an upper endoscopy and ensure nothing else is seen mucosally, even though the barium swallow was unremarkable.  Justice Britain, MD [facility] Advanced Endoscopy Office # [id number]

## 2019-08-14 ENCOUNTER — Encounter: Payer: Self-pay | Admitting: Internal Medicine

## 2019-08-14 ENCOUNTER — Other Ambulatory Visit: Payer: Self-pay

## 2019-08-14 ENCOUNTER — Ambulatory Visit (INDEPENDENT_AMBULATORY_CARE_PROVIDER_SITE_OTHER): Payer: Medicare Other | Admitting: Internal Medicine

## 2019-08-14 DIAGNOSIS — M818 Other osteoporosis without current pathological fracture: Secondary | ICD-10-CM | POA: Diagnosis not present

## 2019-08-14 DIAGNOSIS — R058 Other specified cough: Secondary | ICD-10-CM

## 2019-08-14 DIAGNOSIS — E7800 Pure hypercholesterolemia, unspecified: Secondary | ICD-10-CM | POA: Diagnosis not present

## 2019-08-14 DIAGNOSIS — R05 Cough: Secondary | ICD-10-CM

## 2019-08-14 DIAGNOSIS — I10 Essential (primary) hypertension: Secondary | ICD-10-CM | POA: Diagnosis not present

## 2019-08-14 DIAGNOSIS — M129 Arthropathy, unspecified: Secondary | ICD-10-CM

## 2019-08-14 LAB — CBC WITH DIFFERENTIAL/PLATELET
Basophils Absolute: 0.1 10*3/uL (ref 0.0–0.1)
Basophils Relative: 1.3 % (ref 0.0–3.0)
Eosinophils Absolute: 0.1 10*3/uL (ref 0.0–0.7)
Eosinophils Relative: 2.8 % (ref 0.0–5.0)
HCT: 36.9 % (ref 36.0–46.0)
Hemoglobin: 12.1 g/dL (ref 12.0–15.0)
Lymphocytes Relative: 36 % (ref 12.0–46.0)
Lymphs Abs: 1.7 10*3/uL (ref 0.7–4.0)
MCHC: 32.8 g/dL (ref 30.0–36.0)
MCV: 97.2 fl (ref 78.0–100.0)
Monocytes Absolute: 0.3 10*3/uL (ref 0.1–1.0)
Monocytes Relative: 6.3 % (ref 3.0–12.0)
Neutro Abs: 2.5 10*3/uL (ref 1.4–7.7)
Neutrophils Relative %: 53.6 % (ref 43.0–77.0)
Platelets: 234 10*3/uL (ref 150.0–400.0)
RBC: 3.79 Mil/uL — ABNORMAL LOW (ref 3.87–5.11)
RDW: 13.8 % (ref 11.5–15.5)
WBC: 4.7 10*3/uL (ref 4.0–10.5)

## 2019-08-14 LAB — BASIC METABOLIC PANEL
BUN: 23 mg/dL (ref 6–23)
CO2: 26 mEq/L (ref 19–32)
Calcium: 9.6 mg/dL (ref 8.4–10.5)
Chloride: 109 mEq/L (ref 96–112)
Creatinine, Ser: 0.88 mg/dL (ref 0.40–1.20)
GFR: 73.61 mL/min (ref >60.00)
Glucose, Bld: 103 mg/dL — ABNORMAL HIGH (ref 70–99)
Potassium: 4.3 mEq/L (ref 3.5–5.1)
Sodium: 141 mEq/L (ref 135–145)

## 2019-08-14 LAB — LIPID PANEL
Cholesterol: 268 mg/dL — ABNORMAL HIGH (ref 0–200)
HDL: 76.5 mg/dL (ref >39.00)
LDL Cholesterol: 178 mg/dL — ABNORMAL HIGH (ref 0–99)
NonHDL: 191.32
Total CHOL/HDL Ratio: 4
Triglycerides: 69 mg/dL (ref 0.0–149.0)
VLDL: 13.8 mg/dL (ref 0.0–40.0)

## 2019-08-14 LAB — HEPATIC FUNCTION PANEL
ALT: 11 U/L (ref 0–35)
AST: 15 U/L (ref 0–37)
Albumin: 4 g/dL (ref 3.5–5.2)
Alkaline Phosphatase: 64 U/L (ref 39–117)
Bilirubin, Direct: 0.1 mg/dL (ref 0.0–0.3)
Total Bilirubin: 0.4 mg/dL (ref 0.2–1.2)
Total Protein: 6.5 g/dL (ref 6.0–8.3)

## 2019-08-14 LAB — TSH: TSH: 1.99 u[IU]/mL (ref 0.35–4.50)

## 2019-08-14 NOTE — Progress Notes (Signed)
Spoke with pt and notified of results per Dr. Wert. Pt verbalized understanding and denied any questions. 

## 2019-08-14 NOTE — Assessment & Plan Note (Signed)
- ?   R Rotator cuff problem 08/25/2011 > referred back to [name] - changed to clinoril 02/14/15 due to hbp   Adequate control on present rx, reviewed in detail with pt > no change in rx needed     Pt informed of the seriousness of COVID 19 infection as a direct risk to their health  and safey and to those of their loved ones and should continue to wear facemask in public and minimize exposure to public locations but especially avoid any area or activity where non-close contacts are not observing distancing or wearing an appropriate face mask.    >>> f/u yearly, call sooner if needed

## 2019-08-14 NOTE — Assessment & Plan Note (Signed)
Adequate control on present rx, reviewed in detail with pt > no change in rx needed  = flonase/ zyrtec

## 2019-08-14 NOTE — Assessment & Plan Note (Signed)
-  BMD 08/07/08 tspine -1.2, Left femur -.4, right femur +.2  - Repeat 01/02/2010 1.9 0.1 0.5  - Repeat 07/31/2018  >  wnl > no further studies rec      Not at risk

## 2019-08-14 NOTE — Assessment & Plan Note (Signed)
Lab Results  Component Value Date   CREATININE 0.88 08/14/2019   CREATININE 0.96 02/06/2019   CREATININE 0.91 07/27/2018   CREATININE 0.82 07/16/2014     Adequate control on present rx, reviewed in detail with pt > no change in rx needed

## 2019-08-14 NOTE — Progress Notes (Signed)
Subjective:    Patient ID: [name], female   DOB: 1931-06-04    MRN: [id number]   Brief patient profile:  30  yobf remote minimal smoking hx (quit 1957)  with seasonal rhinits flares better overall since started on immunotherapy May2008 completed aug 2018 >  Followed in pulmonary clinic with hyperlipidemia/djd     History of Present Illness  10/06/2017  f/u ov/[name] re: hyperlipidemia/ chronic rhinitis no long seeing allergy/bardelas Chief Complaint  Patient presents with  . Annual Exam    Pt is fasting. She is c/o nasal congestion and sneezing.   worse nasal symptoms x 3-4 weeks just using flonase / not zyrtec  No reg ex but Not limited by breathing from desired activities  rec  Take flonase consistently and supplement with the zyrtec up to 10 mg daily > if not effective return to Dr Shaune Leeks  Please remember to go to the lab department downstairs in the basement  for your tests - we will call you with the results when they are available.    04/25/2018  f/u ov/[name] re:  Allergic rhinitis/ hyperlipidemia/ djd  Chief Complaint  Patient presents with  . Follow-up    Pt states she has been doing okay since last visit and states she has been having some problems with postnasal drainage that is worse in the evenins.  Dyspnea:  Walked all over [employer] but not doing Y any more due to husband with dementia Cough: just pnds at hs  Sleeping: fine despite pnds  SABA use: no 02: no  Arthritis symptoms better with injections/ prn clinioril  And tol zocor 40 s muscle aches  rec No change rx     07/27/2018  f/u ov/[name] re:  Annual eval/   Rhinitis/ hyperlidpemia/ djd [name])  Chief Complaint  Patient presents with  . Follow-up    3 month visit - under more stress recently with husbands illness. Also time for annual check up she thinks.  Dyspnea:  Not limited by breathing from desired activities  Nor ex cp Cough: none Sleeping: flat/ one pillow/ pillow R knee  rec No change  rx   08/14/2019  f/u ov/[name] re: yearly eval rhinitis with pnds/cough  Hyperlipidemia/ borderline hbp/ djd  Chief Complaint  Patient presents with  . Annual Exam    Pt is fasting.   Dyspnea:  Not limited by breathing from desired activities   Cough: mostly just throat clearing daytime s excess mucus prod Sleeping: flat/ one pillow  SABA use: none 02: none  Some daytime hb depending on what she eats  Using avg zocor sev times a week  No tia/ claudications  No obvious day to day or daytime variability or assoc excess/ purulent sputum or mucus plugs or hemoptysis or cp or chest tightness, subjective wheeze or overt sinus   symptoms.   Sleeping  without nocturnal  or early am exacerbation  of respiratory  c/o's or need for noct saba. Also denies any obvious fluctuation of symptoms with weather or environmental changes or other aggravating or alleviating factors except as outlined above   No unusual exposure hx or h/o childhood pna/ asthma or knowledge of premature birth.  Current Allergies, Complete Past Medical History, Past Surgical History, Family History, and Social History were reviewed in [employer] record.  ROS  The following are not active complaints unless bolded Hoarseness, sore throat, dysphagia, dental problems, itching, sneezing,  nasal congestion or discharge of excess mucus or purulent secretions, ear  ache,   fever, chills, sweats, unintended wt loss or wt gain, classically pleuritic or exertional cp,  orthopnea pnd or arm/hand swelling  or leg swelling, presyncope, palpitations, abdominal pain, anorexia, nausea, vomiting, diarrhea  or change in bowel habits or change in bladder habits, change in stools or change in urine, dysuria, hematuria,  rash, arthralgias, visual complaints, headache, numbness, weakness or ataxia or problems with walking or coordination,  change in mood or  memory.        Current Meds  Medication Sig  . acetaminophen (TYLENOL)  500 MG tablet Take 500 mg by mouth every 6 (six) hours as needed.  . cetirizine (ZYRTEC) 10 MG tablet Take 10 mg by mouth at bedtime as needed (drippy nose, drainage, sneezing).   . clotrimazole-betamethasone (LOTRISONE) cream APPLY TWICE DAILY TO VULVA FOR 3 TO 5 DAYS.  . fluticasone (FLONASE) 50 MCG/ACT nasal spray Place 1 spray into both nostrils 2 (two) times daily as needed for allergies or rhinitis.  . hydrocortisone 2.5 % cream Apply 1 application topically 2 (two) times daily.  . pantoprazole (PROTONIX) 40 MG tablet Take 1 tablet (40 mg total) by mouth daily.  . simvastatin (ZOCOR) 40 MG tablet Take 1 tablet (40 mg total) by mouth daily.  . sulindac (CLINORIL) 200 MG tablet Take 1 tablet (200 mg total) by mouth 2 (two) times daily.  . Vitamin D, Cholecalciferol, 400 units TABS Take by mouth.                      Past Medical History:  ARTHRITIS (ICD-716.90)  Right clavicular head swelling chronic/ new R shoulder pain 06/14/2017  .......................Marland KitchenDr Elwin Sleight Hand Pain.................................................Marland Kitchen   Kuzma RHINITIS, CHRONIC (ICD-472.0).................. [place] [facility] / Bardelas - on immunotherapy since 01/2007 - stopped 04/2017  HYPERLIPIDEMIA (ICD-272.4)  -target LDL less than 160 > try off due to musle aches March 18, 2010 > rechallenged with half dose fall 2011 and tol ok  MORBID OBESITY (ICD-278.01)  - ideal 142 Target wt = 158 for BMI < 30  COLON POLYPS  - colonoscopy 06/24/2006 and 10/08/11 >> no recall due to age  OSTEOPENIA  -BMD 08/07/08 tspine -1.2, Left femur -.4, right femur +.2  - Repeat 01/02/2010 1.9 0.1 0.5  Health Maintenance.......................................................Marland KitchenWert  - CPX 08/14/2019  - Td 09/2003 > 03/27/2013  - Pneumovax 2000 age 85  And prevnar 13 02/27/15  - GYN [name] ([place])      Family History:  Family History Colon Cancer in mother > neg colon @ f/u 2013 > no further f/u per Dr Olevia Perches  she is an only  child and knows nothing about her father's health      Social History:  Negative history of passive tobacco smoke exposure.  Exercise-2 times weekly  Caffeine-2 cups daily  Married with 4 children  Risk Factors:  Smoking Status: quit Age 6      Objective:   Physical Exam  wt 172 August 16, 2008 > 173 December 13, 2008 > 166 02/11/2011 >  03/21/2012  169 > 08/02/2012  165> 162 01/11/2013 >164 6/18/ 14 > 03/27/2013  163  > 05/31/2013 165> 06/11/14 163>  08/15/14  165 > 02/14/2015  162 > 02/27/2015   162 > 05/28/15 161> 08/12/2015   160 > 11/13/2015  159 >  03/02/2016   159  > 08/17/2016  160 > 06/14/2017    155 > 10/06/2017  155 > 04/25/2018   155 > 07/27/2018  151 >  08/14/2019  148    Pleasant amb bf nad   BP (!) 142/68 (BP Location: Left Arm, Cuff Size: Normal)   Pulse 73   Temp (!) 97.1 F (36.2 C) (Temporal)   Ht 5\' 2"  (1.575 m)   Wt 148 lb 9.6 oz (67.4 kg)   SpO2 100% Comment: on RA  BMI 27.18 kg/m         full dentures    HEENT : pt wearing mask not removed for exam due to covid -19 concerns.    NECK :  without JVD/Nodes/TM/ nl carotid upstrokes bilaterally   LUNGS: no acc muscle use,  Nl contour chest which is clear to A and P bilaterally without cough on insp or exp maneuvers   CV:  RRR  no s3 or murmur or increase in P2, and no edema   ABD:  soft and nontender with nl inspiratory excursion in the supine position. No bruits or organomegaly appreciated, bowel sounds nl  MS:  Nl gait/ ext warm without deformities, calf tenderness, cyanosis or clubbing No obvious joint restrictions   SKIN: warm and dry without lesions    NEURO:  alert, approp, nl sensorium with  no motor or cerebellar deficits apparent.       ekg:  Nsr, wnl   Labs ordered/ reviewed:      Chemistry      Component Value Date/Time   NA 141 08/14/2019 1033   K 4.3 08/14/2019 1033   CL 109 08/14/2019 1033   CO2 26 08/14/2019 1033   BUN 23 08/14/2019 1033   CREATININE 0.88 08/14/2019 1033   CREATININE  0.82 07/16/2014 0947      Component Value Date/Time   CALCIUM 9.6 08/14/2019 1033   ALKPHOS 64 08/14/2019 1033   AST 15 08/14/2019 1033   ALT 11 08/14/2019 1033   BILITOT 0.4 08/14/2019 1033        Lab Results  Component Value Date   WBC 4.7 08/14/2019   HGB 12.1 08/14/2019   HCT 36.9 08/14/2019   MCV 97.2 08/14/2019   PLT 234.0 08/14/2019       EOS                                                              0.1                                     08/14/2019     Lab Results  Component Value Date   TSH 1.99 08/14/2019     Lab Results  Component Value Date   PROBNP 55.0 02/21/2013       Lab Results  Component Value Date   ESRSEDRATE 28 06/14/2017   ESRSEDRATE 11 07/16/2014              Assessment:

## 2019-08-14 NOTE — Patient Instructions (Signed)
Please remember to go to the lab department   for your tests - we will call you with the results when they are available.      Please schedule a follow up visit in 12 months but call sooner if needed for cpx on return

## 2019-08-14 NOTE — Assessment & Plan Note (Addendum)
-  target LDL less than 160 > try off statins due to muscle aches March 18, 2010 > rechallenged with half dose fall 2011 and tol ok  - rechallenged with zocor 40 mg daily 05/31/2013 > tolerated well   Lab Results  Component Value Date   CHOL 268 (H) 08/14/2019   HDL 76.50 08/14/2019   LDLCALC 178 (H) 08/14/2019   LDLDIRECT 147.8 07/04/2013   TRIG 69.0 08/14/2019   CHOLHDL 4 08/14/2019     Admits not adherent but note hdl quite high so rec zocor 40 mg one half daily as compromised

## 2019-09-29 IMAGING — CR DG UGI W/ HIGH DENSITY W/O KUB
14 of 16 series · 16 of 24 positions shown · non-contrast
Comparison: None.

CLINICAL DATA: Gastroesophageal reflux

EXAM:
UPPER GI SERIES WITH KUB
TECHNIQUE: After obtaining a scout radiograph a routine upper GI series was
performed using thin and high-density barium
FLUOROSCOPY TIME:  Fluoroscopy Time:  2 minutes 24 seconds
Radiation Exposure Index (if provided by the fluoroscopic device):
95.8
Number of Acquired Spot Images: 16

[t abdomen supine]
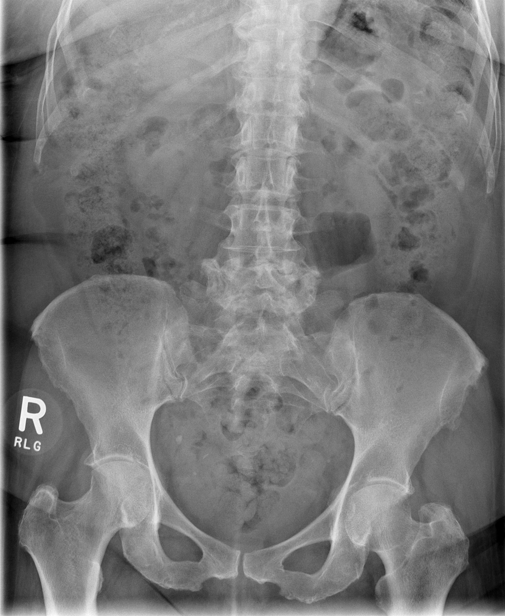

[Series 3: fluoro_barium 2fps_bw · 0.17mm/px · 2 of 2 frames shown (1 of 11)]
[frame 1/2]
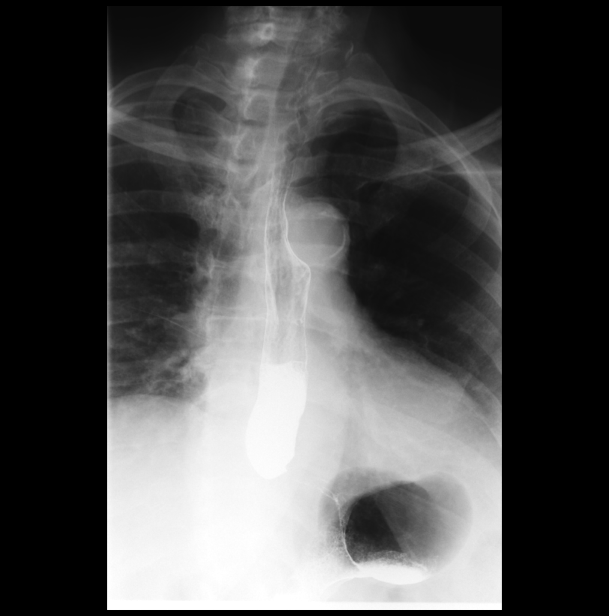
[frame 2/2]
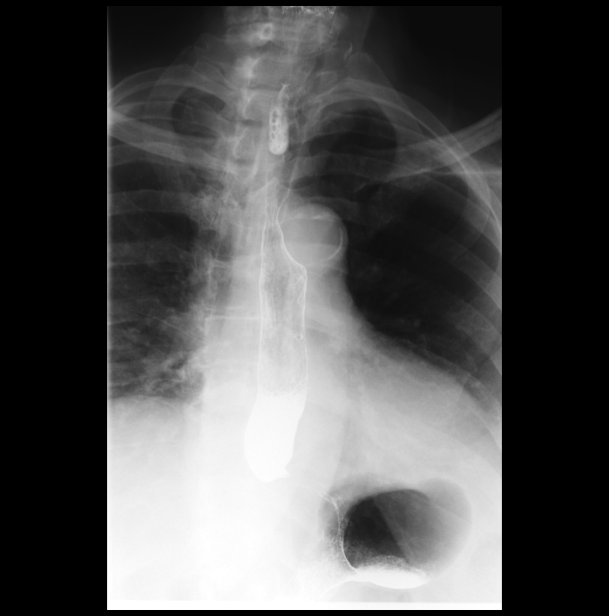

[fluoro_barium 2fps_bw (2 of 11)]
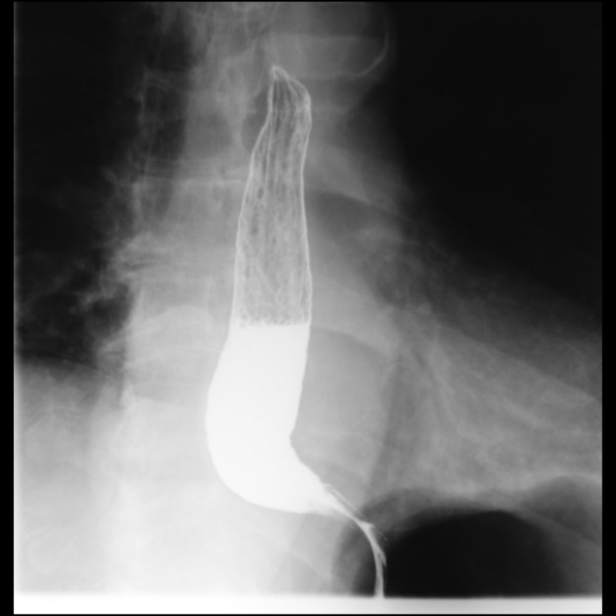

[fluoro_barium 2fps_bw (3 of 11)]
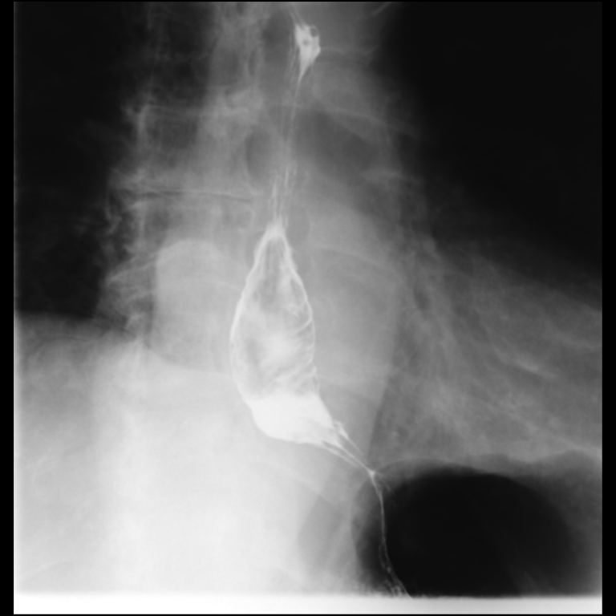

[Series 6: cp_standard · 0.35mm/px · 2 of 67 frames shown (1 of 2)]
[frame 11/67]
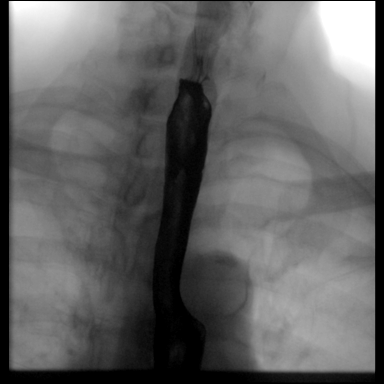
[frame 34/67]
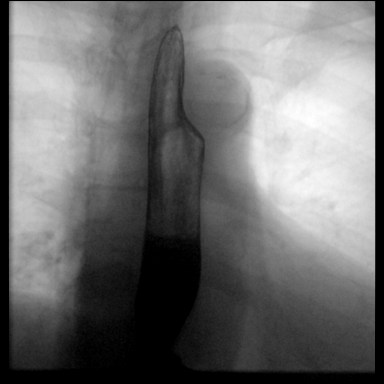

[fluoro_barium 2fps_bw (4 of 11)]
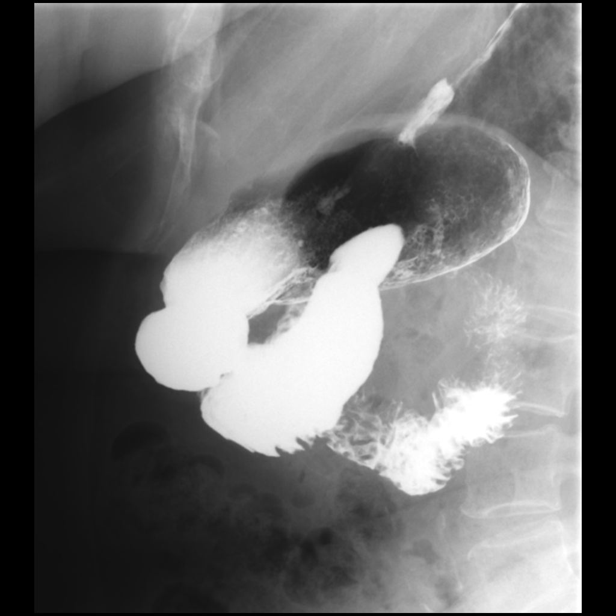

[fluoro_barium 2fps_bw (5 of 11)]
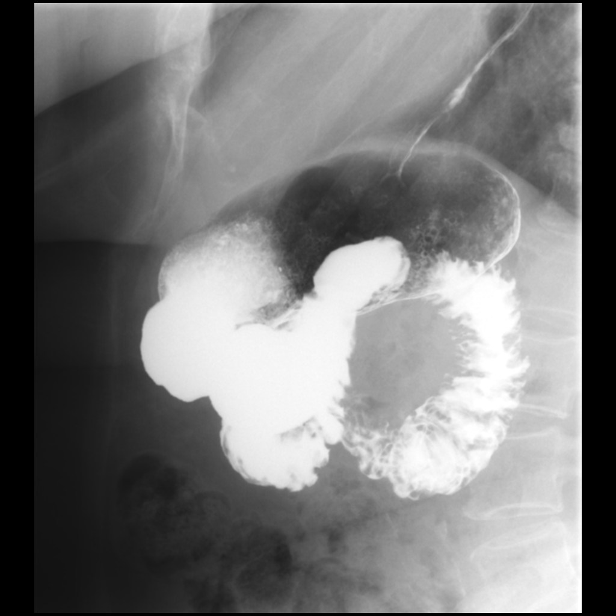

[fluoro_barium 2fps_bw (6 of 11)]
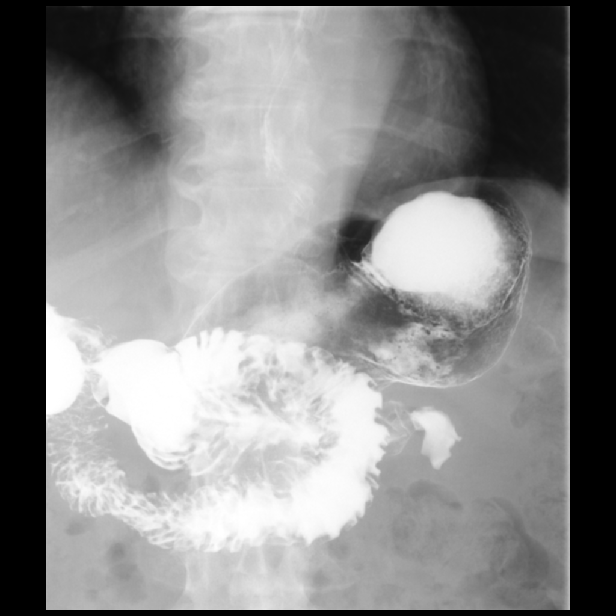

[fluoro_barium 2fps_bw (7 of 11)]
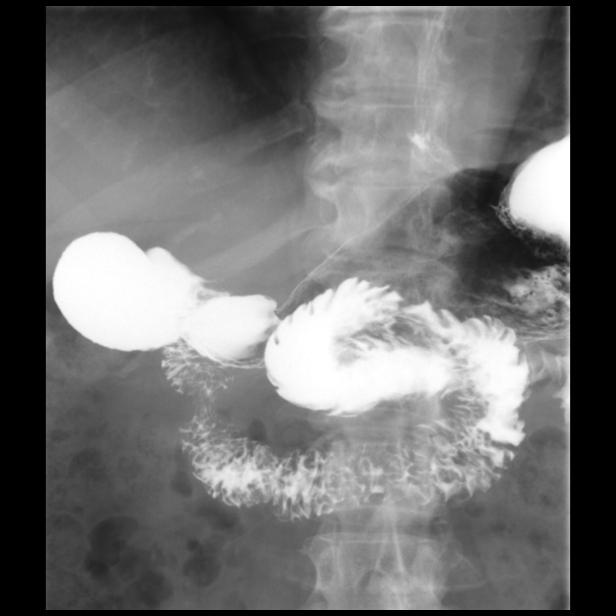

[fluoro_barium 2fps_bw (8 of 11)]
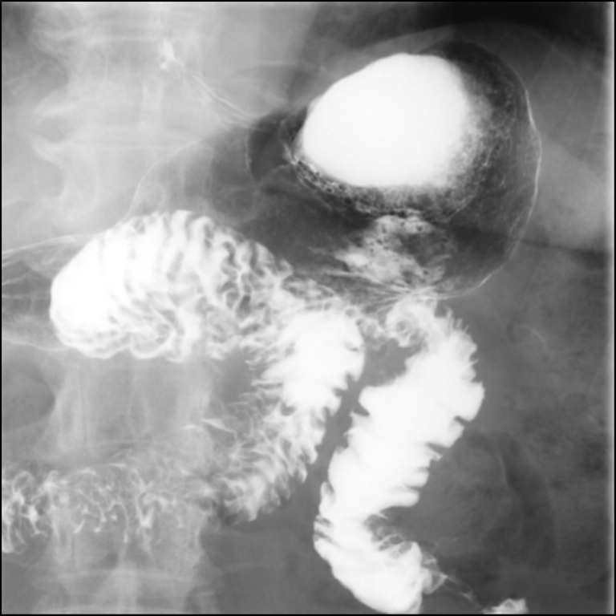

[fluoro_barium 2fps_bw (9 of 11)]
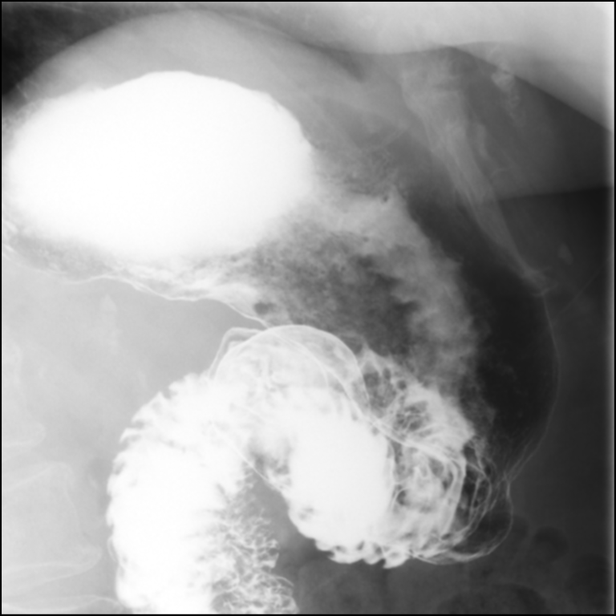

[fluoro_barium 2fps_bw (10 of 11)]
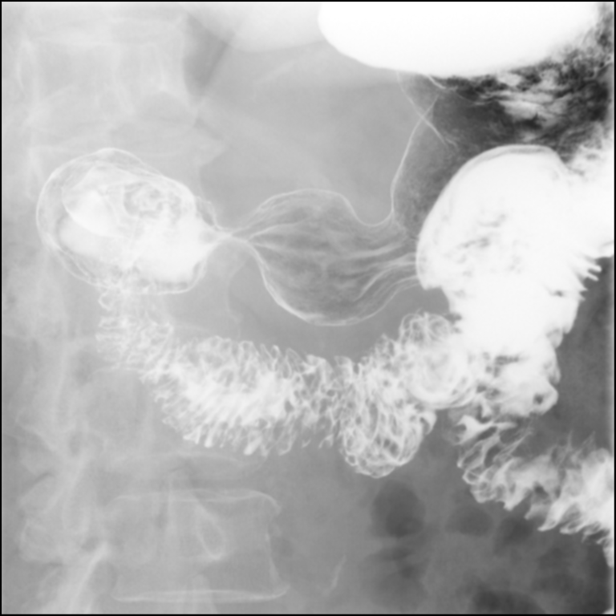

[fluoro_barium 2fps_bw (11 of 11)]
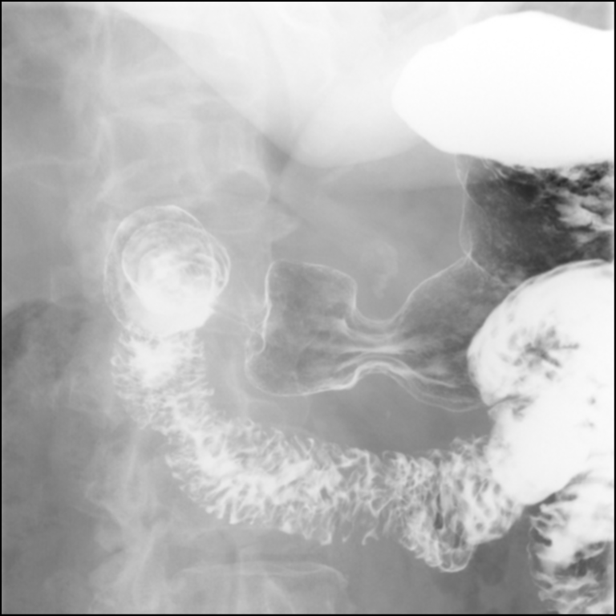

[cp_standard (2 of 2)]
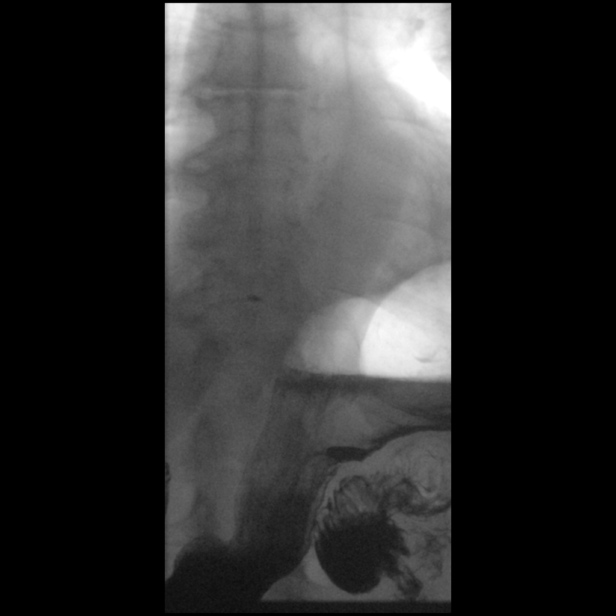

[16 of 24 positions shown; findings below may reference images not displayed]

FINDINGS: No swallowing dysfunction, mucosal irregularity, stricture or mass
within the esophagus. GE junction is normal.

No mucosal irregularity within the stomach. No mucosal irregularity
within the duodenal bulb. The second, third, and fourth portion
duodenum are normal.

Barium tablet tablet passed GE junction easily.
IMPRESSION: Normal esophagram and upper GI study.

## 2020-03-27 ENCOUNTER — Other Ambulatory Visit: Payer: Self-pay | Admitting: Internal Medicine

## 2020-04-30 ENCOUNTER — Emergency Department (HOSPITAL_BASED_OUTPATIENT_CLINIC_OR_DEPARTMENT_OTHER): Payer: Medicare Other

## 2020-04-30 ENCOUNTER — Emergency Department (HOSPITAL_BASED_OUTPATIENT_CLINIC_OR_DEPARTMENT_OTHER)
Admission: EM | Admit: 2020-04-30 | Discharge: 2020-04-30 | Disposition: A | Discharge status: Home / Self Care | Payer: Medicare Other | Attending: Emergency Medicine | Admitting: Emergency Medicine

## 2020-04-30 ENCOUNTER — Other Ambulatory Visit: Payer: Self-pay

## 2020-04-30 ENCOUNTER — Encounter (HOSPITAL_BASED_OUTPATIENT_CLINIC_OR_DEPARTMENT_OTHER): Payer: Self-pay | Admitting: Emergency Medicine

## 2020-04-30 DIAGNOSIS — R531 Weakness: Secondary | ICD-10-CM | POA: Diagnosis not present

## 2020-04-30 DIAGNOSIS — Z79899 Other long term (current) drug therapy: Secondary | ICD-10-CM | POA: Insufficient documentation

## 2020-04-30 DIAGNOSIS — R630 Anorexia: Secondary | ICD-10-CM

## 2020-04-30 DIAGNOSIS — R110 Nausea: Secondary | ICD-10-CM | POA: Diagnosis present

## 2020-04-30 DIAGNOSIS — Z87891 Personal history of nicotine dependence: Secondary | ICD-10-CM | POA: Diagnosis not present

## 2020-04-30 DIAGNOSIS — R109 Unspecified abdominal pain: Secondary | ICD-10-CM | POA: Insufficient documentation

## 2020-04-30 DIAGNOSIS — I10 Essential (primary) hypertension: Secondary | ICD-10-CM | POA: Diagnosis not present

## 2020-04-30 LAB — CBC WITH DIFFERENTIAL/PLATELET
Abs Immature Granulocytes: 0.07 10*3/uL (ref 0.00–0.07)
Basophils Absolute: 0 10*3/uL (ref 0.0–0.1)
Basophils Relative: 0 %
Eosinophils Absolute: 0 10*3/uL (ref 0.0–0.5)
Eosinophils Relative: 0 %
HCT: 41.9 % (ref 36.0–46.0)
Hemoglobin: 13.6 g/dL (ref 12.0–15.0)
Immature Granulocytes: 1 %
Lymphocytes Relative: 11 %
Lymphs Abs: 1.3 10*3/uL (ref 0.7–4.0)
MCH: 31.8 pg (ref 26.0–34.0)
MCHC: 32.5 g/dL (ref 30.0–36.0)
MCV: 97.9 fL (ref 80.0–100.0)
Monocytes Absolute: 1.1 10*3/uL — ABNORMAL HIGH (ref 0.1–1.0)
Monocytes Relative: 9 %
Neutro Abs: 9.8 10*3/uL — ABNORMAL HIGH (ref 1.7–7.7)
Neutrophils Relative %: 79 %
Platelets: 226 10*3/uL (ref 150–400)
RBC: 4.28 MIL/uL (ref 3.87–5.11)
RDW: 13.4 % (ref 11.5–15.5)
WBC: 12.3 10*3/uL — ABNORMAL HIGH (ref 4.0–10.5)
nRBC: 0 % (ref 0.0–0.2)

## 2020-04-30 LAB — URINALYSIS, MICROSCOPIC (REFLEX)

## 2020-04-30 LAB — COMPREHENSIVE METABOLIC PANEL
ALT: 14 U/L (ref 0–44)
AST: 17 U/L (ref 15–41)
Albumin: 3.6 g/dL (ref 3.5–5.0)
Alkaline Phosphatase: 63 U/L (ref 38–126)
Anion gap: 12 (ref 5–15)
BUN: 30 mg/dL — ABNORMAL HIGH (ref 8–23)
CO2: 23 mmol/L (ref 22–32)
Calcium: 9 mg/dL (ref 8.9–10.3)
Chloride: 101 mmol/L (ref 98–111)
Creatinine, Ser: 1.09 mg/dL — ABNORMAL HIGH (ref 0.44–1.00)
GFR calc Af Amer: 53 mL/min — ABNORMAL LOW (ref >60)
GFR calc non Af Amer: 46 mL/min — ABNORMAL LOW (ref >60)
Glucose, Bld: 123 mg/dL — ABNORMAL HIGH (ref 70–99)
Potassium: 3.6 mmol/L (ref 3.5–5.1)
Sodium: 136 mmol/L (ref 135–145)
Total Bilirubin: 1.4 mg/dL — ABNORMAL HIGH (ref 0.3–1.2)
Total Protein: 7.3 g/dL (ref 6.5–8.1)

## 2020-04-30 LAB — URINALYSIS, ROUTINE W REFLEX MICROSCOPIC
Bilirubin Urine: NEGATIVE
Glucose, UA: NEGATIVE mg/dL
Ketones, ur: 15 mg/dL — AB
Leukocytes,Ua: NEGATIVE
Nitrite: NEGATIVE
Protein, ur: NEGATIVE mg/dL
Specific Gravity, Urine: 1.01 (ref 1.005–1.030)
pH: 5.5 (ref 5.0–8.0)

## 2020-04-30 LAB — TROPONIN I (HIGH SENSITIVITY)
Troponin I (High Sensitivity): 24 ng/L — ABNORMAL HIGH (ref <18)
Troponin I (High Sensitivity): 22 ng/L — ABNORMAL HIGH (ref <18)

## 2020-04-30 MED ORDER — PANTOPRAZOLE SODIUM 40 MG IV SOLR
40.0000 mg | Freq: Once | INTRAVENOUS | Status: AC
  Administered 2020-04-30: 40 mg via INTRAVENOUS
  Filled 2020-04-30: qty 40

## 2020-04-30 MED ORDER — ONDANSETRON 4 MG PO TBDP
4.0000 mg | ORAL_TABLET | Freq: Once | ORAL | Status: AC
  Administered 2020-04-30: 4 mg via ORAL
  Filled 2020-04-30: qty 1

## 2020-04-30 MED ORDER — IOHEXOL 300 MG/ML  SOLN
100.0000 mL | Freq: Once | INTRAMUSCULAR | Status: AC | PRN
  Administered 2020-04-30: 100 mL via INTRAVENOUS

## 2020-04-30 MED ORDER — SODIUM CHLORIDE 0.9 % IV SOLN
1.0000 g | Freq: Once | INTRAVENOUS | Status: AC
  Administered 2020-04-30: 1 g via INTRAVENOUS
  Filled 2020-04-30: qty 10

## 2020-04-30 MED ORDER — SODIUM CHLORIDE 0.9 % IV BOLUS
500.0000 mL | Freq: Once | INTRAVENOUS | Status: AC
  Administered 2020-04-30: 500 mL via INTRAVENOUS

## 2020-04-30 MED ORDER — ONDANSETRON 4 MG PO TBDP: 4.0000 mg | ORAL_TABLET | Freq: Three times a day (TID) | ORAL | 0 refills | Status: DC | PRN

## 2020-04-30 NOTE — ED Notes (Signed)
Pt on monitor 

## 2020-04-30 NOTE — ED Notes (Signed)
Pt reports mild nausea, provider aware, attempting po food/fluids.

## 2020-04-30 NOTE — ED Notes (Signed)
Ambulated to bathroom with steady gait, specimen obtained and sent to lab

## 2020-04-30 NOTE — Discharge Instructions (Addendum)
Your work-up today is concerning for possible gallbladder infection.  While we spoke with [facility] and they feel as though outpatient follow-up is appropriate, I would like for you to return to the ED or seek immediate medical attention if you cannot eat or drink.  I would also like for you to return should you develop any fevers or chills, abdominal pain, intractable vomiting, altered mental status, or any other new or worsening symptoms.  I would also like you to follow-up with your primary care provider, Dr. [name].  Please take the Zofran as needed for symptoms of nausea.

## 2020-04-30 NOTE — ED Triage Notes (Signed)
Pt had sudden onset of body pain and vomiting at 2am Sunday morning. (4 days ago) Vomiting passed by morning, but she has no appetite, is weak and lightheaded ever since. Is drinking fluids but not eating much.  Sts her only pain is in the right side of rib cage where she caught her husband Saturday night to keep him from falling.  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

## 2020-04-30 NOTE — ED Provider Notes (Signed)
[facility] EMERGENCY DEPARTMENT Provider Note   CSN: [id number] Arrival date & time: 04/30/20  1101     History Chief Complaint  Patient presents with  . Weakness    [name] is a 86 y.o. female with PMH of HTN and GERD who presents to the ED with complaints of generalized weakness and diminished appetite.  Patient reports that approximately 4 days ago she had sudden onset body pain with nausea and emesis, but that her nausea has since resolved.  However, she continues to endorse the diminished appetite and weakness.  She is also complaining of right-sided rib discomfort from where her husband caught her from falling.  Patient is accompanied by one of her daughters.  Patient states that while she endorsed mild abdominal discomfort during her episode of emesis 4 days ago, she denies any currently.  She also denies any chest pain, shortness of breath, cough, urinary symptoms, or changes in bowel habits.  Last BM was yesterday, normal.    Patient received her COVID-19 vaccinations.  HPI     Past Medical History:  Diagnosis Date  . Arthropathy, unspecified, site unspecified    Dr. [name]...right clavicular head swelling 2003  . Colon polyp    colonoscopy 06-24-06 with no adenomatous change, hyperplasia  only  . Environmental allergies   . Healthcare maintenance    CPX 12-24-09, Td 1/05, pneumovax 2000 age 52, GYN [name] ([place])  . Hyperlipidemia    target less than 160 > try off muscle aches 03-18-10  . Morbid obesity ([place])    ideal = 142.  target wt = 158 for BMI < 30  . Osteopenia    BMD 08-07-08 tspin - 1.2, left femur - .4, right femur +.2.  repeat 01-02-10  1.9    0.1   0.5  . Rhinitis, chronic    Dr. [name]...on immunotherapy since 5/08    Patient Active Problem List   Diagnosis Date Noted  . Gastroesophageal reflux disease 06/06/2019  . Other dysphagia 05/08/2019  . Abdominal pain, epigastric 05/08/2019  . Constipation 05/08/2019  . Chest  pain, musculoskeletal 06/14/2017  . Seasonal allergic conjunctivitis 07/13/2016  . Upper airway cough syndrome 06/29/2016  . Neck pain on left side 08/13/2015  . Other allergic rhinitis 05/23/2015  . Back pain with sciatica on L  08/18/2014  . Paresthesia of bilateral legs 06/11/2014  . Health care maintenance 12/17/2013  . Acute upper respiratory infection 01/14/2013  . Essential hypertension 08/03/2012  . Osteoporosis 12/24/2009  . COLONIC POLYPS, HYPERPLASTIC 11/23/2007  . Hyperlipidemia 08/15/2007  . Morbid obesity ([place]) 08/15/2007  . RHINITIS, CHRONIC 08/15/2007  . Arthropathy 08/15/2007    Past Surgical History:  Procedure Laterality Date  . BUNIONECTOMY  1982   bilateral  . FOOT SURGERY  1970   bilateral 5th toes, bones removed  . GANGLION CYST EXCISION  2000   3rd finger right hand  . TUBAL LIGATION  1971     OB History   No obstetric history on file.     Family History  Problem Relation Age of Onset  . Colon cancer Mother        (in computer for recall colonoscopy 10/12)  . Healthy Son   . Healthy Daughter        x3    Social History   Tobacco Use  . Smoking status: Former Smoker    Packs/day: 0.50    Years: 5.00    Pack years: 2.50    Types: Cigarettes  Quit date: 09/07/1955    Years since quitting: 64.6  . Smokeless tobacco: Never Used  Vaping Use  . Vaping Use: Never used  Substance Use Topics  . Alcohol use: No    Alcohol/week: 0.0 standard drinks  . Drug use: No    Home Medications Prior to Admission medications   Medication Sig Start Date End Date Taking? Authorizing Provider  acetaminophen (TYLENOL) 500 MG tablet Take 500 mg by mouth every 6 (six) hours as needed (pain).    Yes [provider]  cetirizine (ZYRTEC) 10 MG tablet Take 10 mg by mouth at bedtime as needed (drippy nose, drainage, sneezing).    Yes [provider]  hydrocortisone 2.5 % cream Apply 1 application topically 2 (two) times daily as needed  (itching).    Yes [provider]  pantoprazole (PROTONIX) 40 MG tablet Take 1 tablet (40 mg total) by mouth daily. 05/01/19  Yes Zehr, Laban Emperor, PA-C  simvastatin (ZOCOR) 40 MG tablet TAKE 1 TABLET BY MOUTH  DAILY Patient taking differently: Take 40 mg by mouth daily.  03/27/20  Yes [name], MD  sulindac (CLINORIL) 200 MG tablet Take 1 tablet (200 mg total) by mouth 2 (two) times daily. Patient taking differently: Take 200 mg by mouth 2 (two) times daily as needed (pain).  01/25/19  Yes [name], MD  Vitamin D, Cholecalciferol, 400 units TABS Take 1 tablet by mouth daily.    Yes [provider]  ondansetron (ZOFRAN ODT) 4 MG disintegrating tablet Take 1 tablet (4 mg total) by mouth every 8 (eight) hours as needed for nausea or vomiting. 04/30/20   Corena Herter, PA-C    Allergies    Patient has no known allergies.  Review of Systems   Review of Systems  All other systems reviewed and are negative.   Physical Exam Updated Vital Signs BP 119/60   Pulse 79   Temp 98.7 F (37.1 C) (Oral)   Resp 17   Ht 5\' 1"  (1.549 m)   Wt 64.9 kg   SpO2 96%   BMI 27.04 kg/m   Physical Exam Vitals and nursing note reviewed. Exam conducted with a chaperone present.  Constitutional:      General: She is not in acute distress.    Appearance: She is not ill-appearing.  HENT:     Head: Normocephalic and atraumatic.  Eyes:     General: No scleral icterus.    Conjunctiva/sclera: Conjunctivae normal.  Cardiovascular:     Rate and Rhythm: Normal rate and regular rhythm.     Pulses: Normal pulses.  Pulmonary:     Effort: Pulmonary effort is normal. No respiratory distress.     Breath sounds: Normal breath sounds.  Abdominal:     Comments: Soft, nondistended.  No focal TTP.  Reassessed right upper quadrant and still no significant tenderness appreciated.  Negative Murphy sign.  Skin:    General: Skin is dry.  Neurological:     Mental Status: She is alert.      GCS: GCS eye subscore is 4. GCS verbal subscore is 5. GCS motor subscore is 6.  Psychiatric:        Mood and Affect: Mood normal.        Behavior: Behavior normal.        Thought Content: Thought content normal.     ED Results / Procedures / Treatments   Labs (all labs ordered are listed, but only abnormal results are displayed) Labs Reviewed  COMPREHENSIVE METABOLIC PANEL - Abnormal; Notable for the following components:      Result Value   Glucose, Bld 123 (*)    BUN 30 (*)    Creatinine, Ser 1.09 (*)    Total Bilirubin 1.4 (*)    GFR calc non Af Amer 46 (*)    GFR calc Af Amer 53 (*)    All other components within normal limits  CBC WITH DIFFERENTIAL/PLATELET - Abnormal; Notable for the following components:   WBC 12.3 (*)    Neutro Abs 9.8 (*)    Monocytes Absolute 1.1 (*)    All other components within normal limits  URINALYSIS, ROUTINE W REFLEX MICROSCOPIC - Abnormal; Notable for the following components:   Color, Urine AMBER (*)    Hgb urine dipstick TRACE (*)    Ketones, ur 15 (*)    All other components within normal limits  URINALYSIS, MICROSCOPIC (REFLEX) - Abnormal; Notable for the following components:   Bacteria, UA FEW (*)    All other components within normal limits  TROPONIN I (HIGH SENSITIVITY) - Abnormal; Notable for the following components:   Troponin I (High Sensitivity) 24 (*)    All other components within normal limits  TROPONIN I (HIGH SENSITIVITY) - Abnormal; Notable for the following components:   Troponin I (High Sensitivity) 22 (*)    All other components within normal limits    EKG EKG Interpretation  Date/Time:  Wednesday April 30 2020 11:18:51 EDT Ventricular Rate:  79 PR Interval:    QRS Duration: 90 QT Interval:  374 QTC Calculation: 429 R Axis:   20 Text Interpretation: Sinus arrhythmia Ventricular premature complex No old tracing to compare Confirmed by Deno Etienne [phone]) on 04/30/2020 11:22:42 AM   Radiology DG Ribs  Unilateral W/Chest Right  Result Date: 04/30/2020 CLINICAL DATA:  Acute right rib pain. EXAM: RIGHT RIBS AND CHEST - 3+ VIEW COMPARISON:  June 14, 2017. FINDINGS: No fracture or other bone lesions are seen involving the ribs. There is no evidence of pneumothorax or pleural effusion. Both lungs are clear. Heart size and mediastinal contours are within normal limits. IMPRESSION: Negative. Electronically Signed   By: Marijo Conception M.D.   On: 04/30/2020 12:29   CT ABDOMEN PELVIS W CONTRAST  Result Date: 04/30/2020 CLINICAL DATA:  Sudden onset of abdominal pain and vomiting Sunday morning. Weak and lightheaded ever since. Right rib pain. Neutropenic. EXAM: CT ABDOMEN AND PELVIS WITH CONTRAST TECHNIQUE: Multidetector CT imaging of the abdomen and pelvis was performed using the standard protocol following bolus administration of intravenous contrast. CONTRAST:  150mL OMNIPAQUE IOHEXOL 300 MG/ML  SOLN COMPARISON:  04/30/2020 rib radiographs. 10/25/2003 abdominal ultrasound. FINDINGS: Lower chest: Right greater than left base subsegmental atelectasis. Trace right pleural fluid. Hepatobiliary: The gallbladder is somewhat prominent, positioned along the hepatic dome. Trace fluid is seen within the adjacent perihepatic space, including on coronal image 35 and transverse image 9/2. Otherwise normal appearance of the liver. No biliary duct dilatation. Pancreas: Normal pancreas for age. Spleen: Normal in size, without focal abnormality. Adrenals/Urinary Tract: Normal adrenal glands. Normal kidneys, without hydronephrosis. Normal urinary bladder. Stomach/Bowel: Normal stomach, without wall thickening. Normal colon, appendix, and terminal ileum. Normal small bowel. Vascular/Lymphatic: Aortic and branch vessel atherosclerosis. No abdominopelvic adenopathy. Reproductive: Retroverted uterus. Central uterine hypoattenuation of 6 mm on 63/6. No adnexal mass. Other: No free pelvic fluid. No free intraperitoneal air. Mild right  hemidiaphragm elevation. Musculoskeletal: Grade 1 L4-5 anterolisthesis. This presumes a transitional L5 vertebral body. Lower  thoracic spondylosis. IMPRESSION: 1. Gallbladder atypically positioned, adjacent the high right hepatic lobe. Adjacent trace perihepatic fluid is nonspecific. If patient's symptoms suggest acute cholecystitis, consider dedicated abdominal ultrasound. 2. Trace right pleural fluid and right greater than left base atelectasis. This could be secondary to gallbladder inflammation. Alternatively, in the appropriate clinical setting, right-sided pulmonary embolism could also have this appearance. If this is a concern, consider dedicated CTA. 3. Hypoattenuation within the central uterus. This could be within normal variation. If the patient has been postmenopausal bleeding, this would be concerning and correlation with pelvic ultrasound suggested. 4.  Aortic Atherosclerosis (ICD10-I70.0). Electronically Signed   By: Abigail Miyamoto M.D.   On: 04/30/2020 14:38   US Abdomen Limited  Result Date: 04/30/2020 CLINICAL DATA:  Weakness, nausea, vomiting, leukocytosis. Abnormal CT EXAM: ULTRASOUND ABDOMEN LIMITED RIGHT UPPER QUADRANT COMPARISON:  CT 04/30/2020 FINDINGS: Gallbladder: Diffuse gallbladder wall thickening measuring up to 6.4 mm. Trace pericholecystic fluid. No gallstones or biliary sludge visualized. No sonographic Murphy sign noted by sonographer. Common bile duct: Diameter: 5 mm. Liver: No focal lesion identified. Within normal limits in parenchymal echogenicity. Portal vein is patent on color Doppler imaging with normal direction of blood flow towards the liver. Other: None. IMPRESSION: Moderately distended gallbladder with diffuse gallbladder wall thickening and trace pericholecystic fluid. No gallstones or biliary sludge visualized. Sonographer reported a negative Murphy's sign. Findings are equivocal for acute cholecystitis. If further imaging evaluation is clinically warranted, a nuclear  medicine hepatobiliary scan could be performed to evaluate the patency of the cystic duct and common bile duct. Electronically Signed   By: Davina Poke D.O.   On: 04/30/2020 15:48    Procedures Procedures (including critical care time)  Medications Ordered in ED Medications  iohexol (OMNIPAQUE) 300 MG/ML solution 100 mL (100 mLs Intravenous Contrast Given 04/30/20 1411)  sodium chloride 0.9 % bolus 500 mL (0 mLs Intravenous Stopped 04/30/20 1610)  cefTRIAXone (ROCEPHIN) 1 g in sodium chloride 0.9 % 100 mL IVPB (1 g Intravenous New Bag/Given 04/30/20 1747)  pantoprazole (PROTONIX) injection 40 mg (40 mg Intravenous Given 04/30/20 1739)  ondansetron (ZOFRAN-ODT) disintegrating tablet 4 mg (4 mg Oral Given 04/30/20 1826)    ED Course  I have reviewed the triage vital signs and the nursing notes.  Pertinent labs & imaging results that were available during my care of the patient were reviewed by me and considered in my medical decision making (see chart for details).  Clinical Course as of Apr 30 1912  Wed Apr 30, 2020  1701 Discussed case with Dr. Rosendo Gros, [facility] surgery.  We went over patient's physical exam in conjunction with her laboratory work-up and the results of her CT and ultrasound findings.  He is not particularly convinced that this is an acute cholecystitis given her lack of quadrant abdominal tenderness/pain and lack of elevated LFTs aside from his bumped total bilirubin.  Feels as though it is reasonable for discharge home with course of ciprofloxacin and outpatient follow-up with [place] if symptoms fail to improve or worsen.    [GG]    Clinical Course User Index [GG] Corena Herter, PA-C   MDM Rules/Calculators/A&P                          Patient presents the ED with a 4-day history of diminished p.o. intake.  She is no longer endorsing any nausea or abdominal pain symptoms, but she simply has not felt inclined to eat and  is, as a result, feeling  weak.    Labs Troponin: 24 CBC with differential: New leukocytosis 12.3. CMP: Mild elevation in her BUN and creatinine, suggestive of possible dehydration/ prerenal azotemia in setting of diminished p.o. intake. UA: Unremarkable.  EKG is reviewed and demonstrates sinus rhythm with occasional PVC.  No old tracings with which to compare.  Imaging I personally reviewed DG ribs unilateral with chest which demonstrates no acute cardiopulmonary findings or osseous abnormalities.  Discussed low sensitivity of detecting rib fractures with patient, however I have low suspicion for fracture at this time. I personally reviewed CT abdomen pelvis with contrast which demonstrates atypical gallbladder positioning with trace perihepatic fluid concerning for possible cholecystitis.  Recommending limited ultrasound.  There is also hypoattenuation within uterus, patient denies vaginal bleeding.  US abdomen Limited is personally reviewed and demonstrates a moderately distended gallbladder with gallbladder wall thickening and trace pericholecystic fluid equivocal for acute cholecystitis.  Patient reports that 4 days ago she had abdominal pain with associated nausea and vomiting.  She states that her nausea has predominantly resolved, however she has not eaten anything since her episode.  She is also endorsing right-sided anterior rib discomfort which is particularly close to her gallbladder and may be referred pain rather than MSK as she suspected.  Cholecystitis is supported by her leukocytosis.    Discussed case with Dr. Rosendo Gros, [facility] surgery.  We went over patient's physical exam in conjunction with her laboratory work-up and the results of her CT and ultrasound findings.  He is not particularly convinced that this is an acute cholecystitis given her lack of quadrant abdominal tenderness/pain and lack of elevated LFTs.  Feels as though it is reasonable for discharge home with course of ciprofloxacin and  outpatient follow-up with [place] if symptoms fail to improve or worsen.   Discussed briefly with Dr. Maryan Rued who feel as though this is a reasonable plan.  P.o. challenged patient here in the ED and she was able to tolerate food and liquid, albeit with mild nausea symptoms.  Given concern for continued inability to tolerate food or liquid at home, offered admission.  Patient and daughter state that she would prefer to try eating and drinking at home, but with help of Zofran ODT.  Plan is for her to follow-up with general surgery regarding today's encounter.  She also plans to follow-up with her primary care provider for ongoing evaluation and management.  While her vital signs and laboratory work-up here today is reassuring, I emphasized the importance of low threshold for return to the ED if she is unable to eat and drink at home.  This is particularly true given concern for questionable cholecystitis on imaging.  Patient and daughter voiced understanding and are agreeable to the plan.  Daughter states that her or her daughter (who is her next the patient) will keep eyes on patient until they see significant improvement.   Final Clinical Impression(s) / ED Diagnoses Final diagnoses:  Nausea  Appetite loss    Rx / DC Orders ED Discharge Orders         Ordered    ondansetron (ZOFRAN ODT) 4 MG disintegrating tablet  Every 8 hours PRN        04/30/20 1909           Corena Herter, PA-C 04/30/20 [place], DO 05/01/20 [phone]

## 2020-05-05 ENCOUNTER — Other Ambulatory Visit: Payer: Self-pay

## 2020-05-05 ENCOUNTER — Encounter: Payer: Self-pay | Admitting: Adult Health

## 2020-05-05 ENCOUNTER — Ambulatory Visit: Payer: Medicare Other | Admitting: Adult Health

## 2020-05-05 DIAGNOSIS — R1011 Right upper quadrant pain: Secondary | ICD-10-CM | POA: Diagnosis not present

## 2020-05-05 DIAGNOSIS — I10 Essential (primary) hypertension: Secondary | ICD-10-CM | POA: Diagnosis not present

## 2020-05-05 DIAGNOSIS — E7800 Pure hypercholesterolemia, unspecified: Secondary | ICD-10-CM | POA: Diagnosis not present

## 2020-05-05 DIAGNOSIS — R109 Unspecified abdominal pain: Secondary | ICD-10-CM | POA: Insufficient documentation

## 2020-05-05 NOTE — Assessment & Plan Note (Signed)
Blood pressures well controlled no changes.

## 2020-05-05 NOTE — Assessment & Plan Note (Signed)
Continue current regimen.  Follow-up in 3 months with fasting labs.

## 2020-05-05 NOTE — Progress Notes (Signed)
@Patient  ID: [name], female    DOB: [birth year], 86 y.o.   MRN: [id number]  Chief Complaint  Patient presents with  . Follow-up    ER f/up     Referring provider: [name], MD  HPI: 86 year old female with minimal smoking history followed for primary care with Dr. [name].  Patient has known history of hyperlipidemia, and HTN,  chronic rhinitis  TEST/EVENTS :   05/05/2020 Follow up : Hyperlipidemia  Patient presents for a follow-up.  Patient says overall she has been doing well until 1 week when she developed abdominal pain, right sided, severe n/v, and anorexia. Went to ER  Labs showed normal hepatic function.  White count was minimally elevated.  Abdominal ultrasound showed moderately distended gallbladder with diffuse gallbladder wall thickening and trace pericholecystic fluid.  No gallstones or biliary sludge noted.  CT abdomen pelvis showed a prominent gallbladder with trace fluid along the adjacent perihepatic space. Patient was referred to a general surgeon she has an appointment on September 14.  She says since emergency room she is feeling better nausea and vomiting has subsided.  Her abdominal pain is also decreased.  She says she is able to eat well again.   Active and independent . Lives with husband. Able to drive. 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  Able to do housework.    No Known Allergies  Immunization History  Administered Date(s) Administered  . Fluad Quad(high Dose 65+) 05/01/2019  . Influenza Split 06/23/2011, 06/07/2012  . Influenza Whole 05/31/2008, 06/20/2009, 05/21/2010  . Influenza, High Dose Seasonal PF 06/10/2016, 06/14/2017, 07/27/2018  . Influenza,inj,Quad PF,6+ Mos 05/31/2013, 06/11/2014, 05/09/2015  . Pneumococcal Conjugate-13 02/27/2015  . Pneumococcal Polysaccharide-23 08/07/1999  . Td 09/07/2003  . Tdap 03/27/2013  . Zoster Recombinat (Shingrix) 05/08/2019    Past Medical History:  Diagnosis Date  . Arthropathy, unspecified, site  unspecified    Dr. [name]...right clavicular head swelling 2003  . Colon polyp    colonoscopy 06-24-06 with no adenomatous change, hyperplasia  only  . Environmental allergies   . Healthcare maintenance    CPX 12-24-09, Td 1/05, pneumovax 2000 age 80, [name] ([place])  . Hyperlipidemia    target less than 160 > try off muscle aches 03-18-10  . Morbid obesity ([place])    ideal = 142.  target wt = 158 for BMI < 30  . Osteopenia    BMD 08-07-08 tspin - 1.2, left femur - .4, right femur +.2.  repeat 01-02-10  1.9    0.1   0.5  . Rhinitis, chronic    Dr. [name]...on immunotherapy since 5/08    Tobacco History: Social History   Tobacco Use  Smoking Status Former Smoker  . Packs/day: 0.50  . Years: 5.00  . Pack years: 2.50  . Types: Cigarettes  . Quit date: 09/07/1955  . Years since quitting: 64.7  Smokeless Tobacco Never Used   Counseling given: Not Answered   Outpatient Medications Prior to Visit  Medication Sig Dispense Refill  . acetaminophen (TYLENOL) 500 MG tablet Take 500 mg by mouth every 6 (six) hours as needed (pain).     . cetirizine (ZYRTEC) 10 MG tablet Take 10 mg by mouth at bedtime as needed (drippy nose, drainage, sneezing).     . hydrocortisone 2.5 % cream Apply 1 application topically 2 (two) times daily as needed (itching).     . ondansetron (ZOFRAN ODT) 4 MG disintegrating tablet Take 1 tablet (4 mg total) by mouth every 8 (eight) hours  as needed for nausea or vomiting. 20 tablet 0  . pantoprazole (PROTONIX) 40 MG tablet Take 1 tablet (40 mg total) by mouth daily. 30 tablet 5  . simvastatin (ZOCOR) 40 MG tablet TAKE 1 TABLET BY MOUTH  DAILY (Patient taking differently: Take 40 mg by mouth daily. ) 90 tablet 3  . sulindac (CLINORIL) 200 MG tablet Take 1 tablet (200 mg total) by mouth 2 (two) times daily. (Patient taking differently: Take 200 mg by mouth 2 (two) times daily as needed (pain). ) 180 tablet 3  . Vitamin D, Cholecalciferol, 400 units TABS Take 1  tablet by mouth daily.      No facility-administered medications prior to visit.     Review of Systems:   Constitutional:   No  weight loss, night sweats,  Fevers, chills, fatigue, or  lassitude.  HEENT:   No headaches,  Difficulty swallowing,  Tooth/dental problems, or  Sore throat,                No sneezing, itching, ear ache, nasal congestion, post nasal drip,   CV:  No chest pain,  Orthopnea, PND, swelling in lower extremities, anasarca, dizziness, palpitations, syncope.   GI  + abd pain /n/v/    No bloody stools.   Resp: No shortness of breath with exertion or at rest.  No excess mucus, no productive cough,  No non-productive cough,  No coughing up of blood.  No change in color of mucus.  No wheezing.  No chest wall deformity  Skin: no rash or lesions.  GU: no dysuria, change in color of urine, no urgency or frequency.  No flank pain, no hematuria   MS:  No joint pain or swelling.  No decreased range of motion.  No back pain.    Physical Exam  BP 124/64 (BP Location: Left Arm, Cuff Size: Normal)   Pulse 83   Temp 98.6 F (37 C) (Temporal)   Ht 5\' 1"  (1.549 m)   Wt 143 lb 9.6 oz (65.1 kg)   SpO2 97% Comment: RA  BMI 27.13 kg/m   GEN: A/Ox3; pleasant , NAD, well nourished    HEENT:  [place]/AT,   , NOSE-clear, THROAT-clear, no lesions, no postnasal drip or exudate noted.   NECK:  Supple w/ fair ROM; no JVD; normal carotid impulses w/o bruits; no thyromegaly or nodules palpated; no lymphadenopathy.    RESP  Clear  P & A; w/o, wheezes/ rales/ or rhonchi. no accessory muscle use, no dullness to percussion  CARD:  RRR, no m/r/g, no peripheral edema, pulses intact, no cyanosis or clubbing.  GI:   Soft & nt; nml bowel sounds; no organomegaly or masses detected.  No guarding or rebound   Musco: Warm bil, no deformities or joint swelling noted.   Neuro: alert, no focal deficits noted.    Skin: Warm, no lesions or rashes    Lab Results:  CBC    Component Value  Date/Time   WBC 12.3 (H) 04/30/2020 1228   RBC 4.28 04/30/2020 1228   HGB 13.6 04/30/2020 1228   HCT 41.9 04/30/2020 1228   PLT 226 04/30/2020 1228   MCV 97.9 04/30/2020 1228   MCH 31.8 04/30/2020 1228   MCHC 32.5 04/30/2020 1228   RDW 13.4 04/30/2020 1228   LYMPHSABS 1.3 04/30/2020 1228   MONOABS 1.1 (H) 04/30/2020 1228   EOSABS 0.0 04/30/2020 1228   BASOSABS 0.0 04/30/2020 1228    BMET    Component Value Date/Time  NA 136 04/30/2020 1228   K 3.6 04/30/2020 1228   CL 101 04/30/2020 1228   CO2 23 04/30/2020 1228   GLUCOSE 123 (H) 04/30/2020 1228   GLUCOSE 110 (H) 06/22/2006 1144   BUN 30 (H) 04/30/2020 1228   CREATININE 1.09 (H) 04/30/2020 1228   CREATININE 0.82 07/16/2014 0947   CALCIUM 9.0 04/30/2020 1228   GFRNONAA 46 (L) 04/30/2020 1228   GFRAA 53 (L) 04/30/2020 1228    BNP No results found for: BNP  ProBNP    Component Value Date/Time   PROBNP 55.0 02/21/2013 1243    Imaging: DG Ribs Unilateral W/Chest Right  Result Date: 04/30/2020 CLINICAL DATA:  Acute right rib pain. EXAM: RIGHT RIBS AND CHEST - 3+ VIEW COMPARISON:  June 14, 2017. FINDINGS: No fracture or other bone lesions are seen involving the ribs. There is no evidence of pneumothorax or pleural effusion. Both lungs are clear. Heart size and mediastinal contours are within normal limits. IMPRESSION: Negative. Electronically Signed   By: Marijo Conception M.D.   On: 04/30/2020 12:29   CT ABDOMEN PELVIS W CONTRAST  Result Date: 04/30/2020 CLINICAL DATA:  Sudden onset of abdominal pain and vomiting Sunday morning. Weak and lightheaded ever since. Right rib pain. Neutropenic. EXAM: CT ABDOMEN AND PELVIS WITH CONTRAST TECHNIQUE: Multidetector CT imaging of the abdomen and pelvis was performed using the standard protocol following bolus administration of intravenous contrast. CONTRAST:  163mL OMNIPAQUE IOHEXOL 300 MG/ML  SOLN COMPARISON:  04/30/2020 rib radiographs. 10/25/2003 abdominal ultrasound. FINDINGS:  Lower chest: Right greater than left base subsegmental atelectasis. Trace right pleural fluid. Hepatobiliary: The gallbladder is somewhat prominent, positioned along the hepatic dome. Trace fluid is seen within the adjacent perihepatic space, including on coronal image 35 and transverse image 9/2. Otherwise normal appearance of the liver. No biliary duct dilatation. Pancreas: Normal pancreas for age. Spleen: Normal in size, without focal abnormality. Adrenals/Urinary Tract: Normal adrenal glands. Normal kidneys, without hydronephrosis. Normal urinary bladder. Stomach/Bowel: Normal stomach, without wall thickening. Normal colon, appendix, and terminal ileum. Normal small bowel. Vascular/Lymphatic: Aortic and branch vessel atherosclerosis. No abdominopelvic adenopathy. Reproductive: Retroverted uterus. Central uterine hypoattenuation of 6 mm on 63/6. No adnexal mass. Other: No free pelvic fluid. No free intraperitoneal air. Mild right hemidiaphragm elevation. Musculoskeletal: Grade 1 L4-5 anterolisthesis. This presumes a transitional L5 vertebral body. Lower thoracic spondylosis. IMPRESSION: 1. Gallbladder atypically positioned, adjacent the high right hepatic lobe. Adjacent trace perihepatic fluid is nonspecific. If patient's symptoms suggest acute cholecystitis, consider dedicated abdominal ultrasound. 2. Trace right pleural fluid and right greater than left base atelectasis. This could be secondary to gallbladder inflammation. Alternatively, in the appropriate clinical setting, right-sided pulmonary embolism could also have this appearance. If this is a concern, consider dedicated CTA. 3. Hypoattenuation within the central uterus. This could be within normal variation. If the patient has been postmenopausal bleeding, this would be concerning and correlation with pelvic ultrasound suggested. 4.  Aortic Atherosclerosis (ICD10-I70.0). Electronically Signed   By: Abigail Miyamoto M.D.   On: 04/30/2020 14:38   US Abdomen  Limited  Result Date: 04/30/2020 CLINICAL DATA:  Weakness, nausea, vomiting, leukocytosis. Abnormal CT EXAM: ULTRASOUND ABDOMEN LIMITED RIGHT UPPER QUADRANT COMPARISON:  CT 04/30/2020 FINDINGS: Gallbladder: Diffuse gallbladder wall thickening measuring up to 6.4 mm. Trace pericholecystic fluid. No gallstones or biliary sludge visualized. No sonographic Murphy sign noted by sonographer. Common bile duct: Diameter: 5 mm. Liver: No focal lesion identified. Within normal limits in parenchymal echogenicity. Portal vein is patent on color  Doppler imaging with normal direction of blood flow towards the liver. Other: None. IMPRESSION: Moderately distended gallbladder with diffuse gallbladder wall thickening and trace pericholecystic fluid. No gallstones or biliary sludge visualized. Sonographer reported a negative Murphy's sign. Findings are equivocal for acute cholecystitis. If further imaging evaluation is clinically warranted, a nuclear medicine hepatobiliary scan could be performed to evaluate the patency of the cystic duct and common bile duct. Electronically Signed   By: Davina Poke D.O.   On: 04/30/2020 15:48      No flowsheet data found.  No results found for: NITRICOXIDE      Assessment & Plan:   Abdominal pain Right-sided abdominal pain with distended gallbladder and diffuse gallbladder wall thickening consistent with cholecystitis.  Patient is clinically better have advised her to proceed with a bland diet.  Avoid fried fatty foods.  She is to follow-up with her surgeon in 2 weeks.  Essential hypertension Blood pressures well controlled no changes.  Hyperlipidemia Continue current regimen.  Follow-up in 3 months with fasting labs.     Rexene Edison, NP 05/05/2020

## 2020-05-05 NOTE — Patient Instructions (Addendum)
Advance bland diet . Avoid fried fatty foods.  Follow up with surgeon as planned .  Follow up with Dr. [name]  In 3 months for physical and As needed

## 2020-05-05 NOTE — Assessment & Plan Note (Signed)
Right-sided abdominal pain with distended gallbladder and diffuse gallbladder wall thickening consistent with cholecystitis.  Patient is clinically better have advised her to proceed with a bland diet.  Avoid fried fatty foods.  She is to follow-up with her surgeon in 2 weeks.

## 2020-05-21 ENCOUNTER — Other Ambulatory Visit (HOSPITAL_COMMUNITY): Payer: Self-pay | Admitting: General Surgery

## 2020-05-21 ENCOUNTER — Other Ambulatory Visit: Payer: Self-pay | Admitting: General Surgery

## 2020-05-21 DIAGNOSIS — K811 Chronic cholecystitis: Secondary | ICD-10-CM

## 2020-05-27 ENCOUNTER — Other Ambulatory Visit: Payer: Self-pay | Admitting: Gastroenterology

## 2020-06-04 ENCOUNTER — Other Ambulatory Visit: Payer: Self-pay

## 2020-06-04 ENCOUNTER — Encounter (HOSPITAL_COMMUNITY)
Admission: RE | Admit: 2020-06-04 | Discharge: 2020-06-04 | Disposition: A | Discharge status: Home / Self Care | Payer: Medicare Other | Source: Ambulatory Visit | Attending: General Surgery | Admitting: General Surgery

## 2020-06-04 DIAGNOSIS — K811 Chronic cholecystitis: Secondary | ICD-10-CM | POA: Insufficient documentation

## 2020-06-04 MED ORDER — MORPHINE SULFATE (PF) 4 MG/ML IV SOLN
INTRAVENOUS | Status: AC
  Administered 2020-06-04: 2.5 mg via INTRAVENOUS
  Filled 2020-06-04: qty 1

## 2020-06-04 MED ORDER — MORPHINE SULFATE (PF) 4 MG/ML IV SOLN: 2.5000 mg | Freq: Once | INTRAVENOUS | Status: AC

## 2020-06-04 MED ORDER — TECHNETIUM TC 99M MEBROFENIN IV KIT
5.3000 | PACK | Freq: Once | INTRAVENOUS | Status: AC | PRN
  Administered 2020-06-04: 5.3 via INTRAVENOUS

## 2020-06-06 ENCOUNTER — Ambulatory Visit: Payer: Self-pay | Admitting: General Surgery

## 2020-06-06 NOTE — H&P (Signed)
  History of Present Illness (Lourdez Mcgahan MD; 06/06/2020 10:03 AM) The patient is a 86 year old female who presents for evaluation of gall stones. Patient's follow back up today after HIDA scan is been repleted. HIDA scan did reveal nonvisualization of the gallbladder, likely consistent with chronic cholecystitis. Patient states that she's had less pain, has been staying on a low-fat diet with good relief pain. Patient states she is in favor of surgery if this would help her continue with normal food.     Allergies (Kelsey Phillips, CMA; 06/06/2020 9:52 AM) No Known Drug Allergies  [05/21/2020]: Allergies Reconciled   Medication History (Kelsey Phillips, CMA; 06/06/2020 9:52 AM) Zofran (4MG Tablet, Oral) Active. Simvastatin (40MG Tablet, Oral) Active. Protonix (40MG Tablet DR, Oral) Active. Sulindac (200MG Tablet, Oral) Active. ZyrTEC (10MG Tablet, Oral) Active. Vitamin D (Oral) Specific strength unknown - Active. Medications Reconciled    Review of Systems (Andreas Sobolewski, MD; 06/06/2020 10:4 AM) General Present- Feeling well. Not Present- Fever. Skin Not Present- Change in Wart/Mole, Dryness, Hives, Jaundice, New Lesions, Non-Healing Wounds, Rash and Ulcer. HEENT Present- Seasonal Allergies, Sinus Pain and Wears glasses/contact lenses. Not Present- Earache, Hearing Loss, Hoarseness, Nose Bleed, Oral Ulcers, Ringing in the Ears, Sore Throat, Visual Disturbances and Yellow Eyes. Respiratory Not Present- Cough and Difficulty Breathing. Cardiovascular Not Present- Chest Pain. Gastrointestinal Present- Abdominal Pain and Nausea. Female Genitourinary Not Present- Frequency, Nocturia, Painful Urination, Pelvic Pain and Urgency. Musculoskeletal Not Present- Myalgia. Neurological Not Present- Weakness. Psychiatric Present- Anxiety. Not Present- Bipolar, Change in Sleep Pattern, Depression, Fearful and Frequent crying. Endocrine Present- Hair Changes. Not Present- Cold  Intolerance, Excessive Hunger, Heat Intolerance, Hot flashes and New Diabetes. All other systems negative  Vitals (Kelsey Phillips CMA; 06/06/2020 9:52 AM) 06/06/2020 9:51 AM Weight: 141 lb Height: 61in Body Surface Area: 1.63 m Body Mass Index: 26.64 kg/m  Temp.: 97.5F  Pulse: 66 (Regular)  BP: 158/88(Sitting, Left Arm, Standard)       Physical Exam (Haedyn Ancrum, MD; 06/06/2020 10:4 AM) The physical exam findings are as follows: Note: Constitutional: No acute distress, conversant, appears stated age  Eyes: Anicteric sclerae, moist conjunctiva, no lid lag  Neck: No thyromegaly, trachea midline, no cervical lymphadenopathy  Lungs: Clear to auscultation biilaterally, normal respiratory effot  Cardiovascular: regular rate & rhythm, no murmurs, no peripheal edema, pedal pulses 2+  GI: Soft, no masses or hepatosplenomegaly, tender to palpation 2 right upper quadrant/right lower quadrant area.  MSK: Normal gait, no clubbing cyanosis, edema  Skin: No rashes, palpation reveals normal skin turgor  Psychiatric: Appropriate judgment and insight, oriented to person, place, and time    Assessment & Plan (Brytni Dray MD; 06/06/2020 10:04 AM) CHRONIC CHOLECYSTITIS (K81.1) Impression: Patient is an 86-year-old female with a history of hyperlipidemia, with cholecystitis. Patient is in favor proceeding with laparoscopic cholecystectomy.   1. We will proceed to the operating room for a laparoscopic cholecystectomy  2. Risks and benefits were discussed with the patient to generally include, but not limited to: infection, bleeding, possible need for post op ERCP, damage to the bile ducts, bile leak, and possible need for further surgery. Alternatives were offered and described. All questions were answered and the patient voiced understanding of the procedure and wishes to proceed at this point with a laparoscopic cholecystectomy 

## 2020-07-14 ENCOUNTER — Other Ambulatory Visit (HOSPITAL_COMMUNITY)
Admission: RE | Admit: 2020-07-14 | Discharge: 2020-07-14 | Disposition: A | Discharge status: Home / Self Care | Payer: Medicare Other | Source: Ambulatory Visit | Attending: General Surgery | Admitting: General Surgery

## 2020-07-14 DIAGNOSIS — Z20822 Contact with and (suspected) exposure to covid-19: Secondary | ICD-10-CM | POA: Insufficient documentation

## 2020-07-14 DIAGNOSIS — Z01812 Encounter for preprocedural laboratory examination: Secondary | ICD-10-CM | POA: Insufficient documentation

## 2020-07-14 LAB — SARS CORONAVIRUS 2 (TAT 6-24 HRS): SARS Coronavirus 2: NEGATIVE

## 2020-07-16 ENCOUNTER — Other Ambulatory Visit: Payer: Self-pay

## 2020-07-16 NOTE — Progress Notes (Signed)
Patient denies shortness of breath, fever, cough or chest pain.  PCP - Dr Christinia Gully Cardiologist - n/a  Chest x-ray - n/a EKG - 04/30/20 Stress Test - n/a ECHO - n/a Cardiac Cath - n/a  ERAS: Clears til 8 am DOS, no drink.  STOP now taking any Aspirin (unless otherwise instructed by your surgeon), Aleve, Naproxen, Ibuprofen, Motrin, Advil, Goody's, BC's, all herbal medications, fish oil, and all vitamins.   Coronavirus Screening Covid test on 07/14/20 was negative.  Patient verbalized understanding of instructions that were given via phone.

## 2020-07-17 ENCOUNTER — Encounter (HOSPITAL_COMMUNITY)
Admission: RE | Disposition: A | Discharge status: Home / Self Care | Payer: Self-pay | Source: Home / Self Care | Attending: General Surgery

## 2020-07-17 ENCOUNTER — Ambulatory Visit (HOSPITAL_COMMUNITY)
Admission: RE | Admit: 2020-07-17 | Discharge: 2020-07-17 | Disposition: A | Discharge status: Home / Self Care | Payer: Medicare Other | Attending: General Surgery | Admitting: General Surgery

## 2020-07-17 ENCOUNTER — Ambulatory Visit (HOSPITAL_COMMUNITY): Payer: Medicare Other | Admitting: Anesthesiology

## 2020-07-17 ENCOUNTER — Encounter (HOSPITAL_COMMUNITY): Payer: Self-pay | Admitting: General Surgery

## 2020-07-17 DIAGNOSIS — Z87891 Personal history of nicotine dependence: Secondary | ICD-10-CM | POA: Diagnosis not present

## 2020-07-17 DIAGNOSIS — K811 Chronic cholecystitis: Secondary | ICD-10-CM | POA: Diagnosis not present

## 2020-07-17 DIAGNOSIS — Z79899 Other long term (current) drug therapy: Secondary | ICD-10-CM | POA: Insufficient documentation

## 2020-07-17 HISTORY — DX: Gastro-esophageal reflux disease without esophagitis: K21.9

## 2020-07-17 HISTORY — DX: Unspecified osteoarthritis, unspecified site: M19.90

## 2020-07-17 HISTORY — PX: CHOLECYSTECTOMY: SHX55

## 2020-07-17 LAB — CBC
HCT: 34.4 % — ABNORMAL LOW (ref 36.0–46.0)
Hemoglobin: 11.2 g/dL — ABNORMAL LOW (ref 12.0–15.0)
MCH: 32.4 pg (ref 26.0–34.0)
MCHC: 32.6 g/dL (ref 30.0–36.0)
MCV: 99.4 fL (ref 80.0–100.0)
Platelets: 222 10*3/uL (ref 150–400)
RBC: 3.46 MIL/uL — ABNORMAL LOW (ref 3.87–5.11)
RDW: 13.9 % (ref 11.5–15.5)
WBC: 4.1 10*3/uL (ref 4.0–10.5)
nRBC: 0 % (ref 0.0–0.2)

## 2020-07-17 SURGERY — LAPAROSCOPIC CHOLECYSTECTOMY
Anesthesia: General | Site: Abdomen

## 2020-07-17 MED ORDER — CEFAZOLIN SODIUM-DEXTROSE 2-4 GM/100ML-% IV SOLN
2.0000 g | INTRAVENOUS | Status: AC
  Administered 2020-07-17: 2 g via INTRAVENOUS

## 2020-07-17 MED ORDER — AMISULPRIDE (ANTIEMETIC) 5 MG/2ML IV SOLN
INTRAVENOUS | Status: AC
  Administered 2020-07-17: 10 mg
  Filled 2020-07-17: qty 4

## 2020-07-17 MED ORDER — ACETAMINOPHEN 500 MG PO TABS
ORAL_TABLET | ORAL | Status: AC
  Administered 2020-07-17: 1000 mg via ORAL
  Filled 2020-07-17: qty 2

## 2020-07-17 MED ORDER — FENTANYL CITRATE (PF) 250 MCG/5ML IJ SOLN
INTRAMUSCULAR | Status: AC
  Filled 2020-07-17: qty 5

## 2020-07-17 MED ORDER — CHLORHEXIDINE GLUCONATE CLOTH 2 % EX PADS: 6.0000 | MEDICATED_PAD | Freq: Once | CUTANEOUS | Status: DC

## 2020-07-17 MED ORDER — ORAL CARE MOUTH RINSE: 15.0000 mL | Freq: Once | OROMUCOSAL | Status: AC

## 2020-07-17 MED ORDER — AMISULPRIDE (ANTIEMETIC) 5 MG/2ML IV SOLN: 10.0000 mg | Freq: Once | INTRAVENOUS | Status: DC

## 2020-07-17 MED ORDER — TRAMADOL HCL 50 MG PO TABS
50.0000 mg | ORAL_TABLET | Freq: Four times a day (QID) | ORAL | 0 refills | Status: AC | PRN
Start: 2020-07-17 — End: 2021-07-17

## 2020-07-17 MED ORDER — FENTANYL CITRATE (PF) 100 MCG/2ML IJ SOLN
INTRAMUSCULAR | Status: AC
  Filled 2020-07-17: qty 2

## 2020-07-17 MED ORDER — PHENYLEPHRINE 40 MCG/ML (10ML) SYRINGE FOR IV PUSH (FOR BLOOD PRESSURE SUPPORT)
PREFILLED_SYRINGE | INTRAVENOUS | Status: DC | PRN
  Administered 2020-07-17: 80 ug via INTRAVENOUS

## 2020-07-17 MED ORDER — ENSURE PRE-SURGERY PO LIQD: 296.0000 mL | Freq: Once | ORAL | Status: DC

## 2020-07-17 MED ORDER — SODIUM CHLORIDE 0.9 % IR SOLN
Status: DC | PRN
  Administered 2020-07-17: 1000 mL

## 2020-07-17 MED ORDER — LACTATED RINGERS IV SOLN: INTRAVENOUS | Status: DC

## 2020-07-17 MED ORDER — CHLORHEXIDINE GLUCONATE 0.12 % MT SOLN
OROMUCOSAL | Status: AC
  Administered 2020-07-17: 15 mL via OROMUCOSAL
  Filled 2020-07-17: qty 15

## 2020-07-17 MED ORDER — 0.9 % SODIUM CHLORIDE (POUR BTL) OPTIME
TOPICAL | Status: DC | PRN
  Administered 2020-07-17: 1000 mL

## 2020-07-17 MED ORDER — FENTANYL CITRATE (PF) 250 MCG/5ML IJ SOLN
INTRAMUSCULAR | Status: DC | PRN
  Administered 2020-07-17 (×5): 50 ug via INTRAVENOUS

## 2020-07-17 MED ORDER — ROCURONIUM BROMIDE 10 MG/ML (PF) SYRINGE
PREFILLED_SYRINGE | INTRAVENOUS | Status: DC | PRN
  Administered 2020-07-17: 40 mg via INTRAVENOUS

## 2020-07-17 MED ORDER — SUGAMMADEX SODIUM 200 MG/2ML IV SOLN
INTRAVENOUS | Status: DC | PRN
  Administered 2020-07-17: 130 mg via INTRAVENOUS

## 2020-07-17 MED ORDER — BUPIVACAINE HCL (PF) 0.25 % IJ SOLN
INTRAMUSCULAR | Status: AC
  Filled 2020-07-17: qty 30

## 2020-07-17 MED ORDER — CEFAZOLIN SODIUM-DEXTROSE 2-4 GM/100ML-% IV SOLN
INTRAVENOUS | Status: AC
  Filled 2020-07-17: qty 100

## 2020-07-17 MED ORDER — FENTANYL CITRATE (PF) 100 MCG/2ML IJ SOLN
25.0000 ug | INTRAMUSCULAR | Status: DC | PRN
  Administered 2020-07-17 (×2): 50 ug via INTRAVENOUS

## 2020-07-17 MED ORDER — ONDANSETRON HCL 4 MG/2ML IJ SOLN
INTRAMUSCULAR | Status: AC
  Filled 2020-07-17: qty 2

## 2020-07-17 MED ORDER — ACETAMINOPHEN 500 MG PO TABS: 1000.0000 mg | ORAL_TABLET | ORAL | Status: AC

## 2020-07-17 MED ORDER — CHLORHEXIDINE GLUCONATE 0.12 % MT SOLN: 15.0000 mL | Freq: Once | OROMUCOSAL | Status: AC

## 2020-07-17 MED ORDER — LIDOCAINE 2% (20 MG/ML) 5 ML SYRINGE
INTRAMUSCULAR | Status: DC | PRN
  Administered 2020-07-17: 40 mg via INTRAVENOUS

## 2020-07-17 MED ORDER — DEXAMETHASONE SODIUM PHOSPHATE 10 MG/ML IJ SOLN
INTRAMUSCULAR | Status: DC | PRN
  Administered 2020-07-17: 4 mg via INTRAVENOUS

## 2020-07-17 MED ORDER — ONDANSETRON HCL 4 MG/2ML IJ SOLN
4.0000 mg | Freq: Once | INTRAMUSCULAR | Status: AC | PRN
  Administered 2020-07-17: 4 mg via INTRAVENOUS

## 2020-07-17 MED ORDER — PROPOFOL 10 MG/ML IV BOLUS
INTRAVENOUS | Status: DC | PRN
  Administered 2020-07-17 (×2): 50 mg via INTRAVENOUS

## 2020-07-17 MED ORDER — BUPIVACAINE HCL 0.25 % IJ SOLN
INTRAMUSCULAR | Status: DC | PRN
  Administered 2020-07-17: 7 mL

## 2020-07-17 MED ORDER — ONDANSETRON HCL 4 MG/2ML IJ SOLN
INTRAMUSCULAR | Status: DC | PRN
  Administered 2020-07-17: 4 mg via INTRAVENOUS

## 2020-07-17 SURGICAL SUPPLY — 41 items
ADH SKN CLS APL DERMABOND .7 (GAUZE/BANDAGES/DRESSINGS) ×1
APL PRP STRL LF DISP 70% ISPRP (MISCELLANEOUS) ×1
BAG SPEC RTRVL 10 TROC 200 (ENDOMECHANICALS) ×1
CANISTER SUCT 3000ML PPV (MISCELLANEOUS) ×3 IMPLANT
CHLORAPREP W/TINT 26 (MISCELLANEOUS) ×3 IMPLANT
CLIP VESOLOCK MED LG 6/CT (CLIP) ×3 IMPLANT
COVER SURGICAL LIGHT HANDLE (MISCELLANEOUS) ×3 IMPLANT
COVER TRANSDUCER ULTRASND (DRAPES) ×3 IMPLANT
COVER WAND RF STERILE (DRAPES) ×3 IMPLANT
DERMABOND ADVANCED (GAUZE/BANDAGES/DRESSINGS) ×2
DERMABOND ADVANCED .7 DNX12 (GAUZE/BANDAGES/DRESSINGS) ×1 IMPLANT
ELECT REM PT RETURN 9FT ADLT (ELECTROSURGICAL) ×3
ELECTRODE REM PT RTRN 9FT ADLT (ELECTROSURGICAL) ×1 IMPLANT
GLOVE BIO SURGEON STRL SZ7.5 (GLOVE) ×3 IMPLANT
GOWN STRL REUS W/ TWL LRG LVL3 (GOWN DISPOSABLE) ×2 IMPLANT
GOWN STRL REUS W/ TWL XL LVL3 (GOWN DISPOSABLE) ×1 IMPLANT
GOWN STRL REUS W/TWL LRG LVL3 (GOWN DISPOSABLE) ×6
GOWN STRL REUS W/TWL XL LVL3 (GOWN DISPOSABLE) ×3
GRASPER SUT TROCAR 14GX15 (MISCELLANEOUS) ×3 IMPLANT
KIT BASIN OR (CUSTOM PROCEDURE TRAY) ×3 IMPLANT
KIT TURNOVER KIT B (KITS) ×3 IMPLANT
NDL INSUFFLATION 14GA 120MM (NEEDLE) ×1 IMPLANT
NEEDLE INSUFFLATION 14GA 120MM (NEEDLE) ×3 IMPLANT
NS IRRIG 1000ML POUR BTL (IV SOLUTION) ×3 IMPLANT
PAD ARMBOARD 7.5X6 YLW CONV (MISCELLANEOUS) ×3 IMPLANT
POUCH LAPAROSCOPIC INSTRUMENT (MISCELLANEOUS) ×3 IMPLANT
POUCH RETRIEVAL ECOSAC 10 (ENDOMECHANICALS) IMPLANT
POUCH RETRIEVAL ECOSAC 10MM (ENDOMECHANICALS) ×3
SCISSORS LAP 5X35 DISP (ENDOMECHANICALS) ×3 IMPLANT
SET IRRIG TUBING LAPAROSCOPIC (IRRIGATION / IRRIGATOR) ×3 IMPLANT
SET TUBE SMOKE EVAC HIGH FLOW (TUBING) ×3 IMPLANT
SLEEVE ENDOPATH XCEL 5M (ENDOMECHANICALS) ×3 IMPLANT
SPECIMEN JAR SMALL (MISCELLANEOUS) ×3 IMPLANT
SUT MNCRL AB 4-0 PS2 18 (SUTURE) ×3 IMPLANT
TOWEL GREEN STERILE (TOWEL DISPOSABLE) ×3 IMPLANT
TOWEL GREEN STERILE FF (TOWEL DISPOSABLE) ×3 IMPLANT
TRAY LAPAROSCOPIC MC (CUSTOM PROCEDURE TRAY) ×3 IMPLANT
TROCAR XCEL NON-BLD 11X100MML (ENDOMECHANICALS) ×3 IMPLANT
TROCAR XCEL NON-BLD 5MMX100MML (ENDOMECHANICALS) ×3 IMPLANT
WARMER LAPAROSCOPE (MISCELLANEOUS) ×3 IMPLANT
WATER STERILE IRR 1000ML POUR (IV SOLUTION) ×3 IMPLANT

## 2020-07-17 NOTE — Anesthesia Postprocedure Evaluation (Signed)
Anesthesia Post Note  Patient: [name]  Procedure(s) Performed: LAPAROSCOPIC CHOLECYSTECTOMY (N/A Abdomen)     Patient location during evaluation: PACU Anesthesia Type: General Level of consciousness: awake Pain management: pain level controlled Vital Signs Assessment: post-procedure vital signs reviewed and stable Respiratory status: spontaneous breathing, nonlabored ventilation, respiratory function stable and patient connected to nasal cannula oxygen Cardiovascular status: blood pressure returned to baseline and stable Postop Assessment: no apparent nausea or vomiting Anesthetic complications: no   No complications documented.  Last Vitals:  Vitals:   07/17/20 1314 07/17/20 1400  BP:    Pulse: 80   Resp: 19   Temp:  (!) 36.3 C  SpO2: 100%     Last Pain:  Vitals:   07/17/20 1314  TempSrc:   PainSc: 3                  Cassy Sprowl P Yuette Putnam

## 2020-07-17 NOTE — Anesthesia Preprocedure Evaluation (Addendum)
Anesthesia Evaluation  Patient identified by MRN, date of birth, ID band Patient awake    Reviewed: Allergy & Precautions, NPO status , Patient's Chart, lab work & pertinent test results  Airway Mallampati: II  TM Distance: >3 FB Neck ROM: Full    Dental  (+) Edentulous Upper, Edentulous Lower   Pulmonary former smoker,    Pulmonary exam normal breath sounds clear to auscultation       Cardiovascular negative cardio ROS Normal cardiovascular exam Rhythm:Regular Rate:Normal  ECG: rate 79   Neuro/Psych  Neuromuscular disease negative psych ROS   GI/Hepatic Neg liver ROS, GERD  Medicated and Controlled,  Endo/Other  negative endocrine ROS  Renal/GU negative Renal ROS     Musculoskeletal  (+) Arthritis ,   Abdominal   Peds  Hematology HLD   Anesthesia Other Findings CHRONIC CHOLECYSTITIS  Reproductive/Obstetrics                            Anesthesia Physical Anesthesia Plan  ASA: II  Anesthesia Plan: General   Post-op Pain Management:    Induction: Intravenous  PONV Risk Score and Plan: 4 or greater and Ondansetron, Dexamethasone, Propofol infusion and Treatment may vary due to age or medical condition  Airway Management Planned: Oral ETT  Additional Equipment:   Intra-op Plan:   Post-operative Plan: Extubation in OR  Informed Consent: I have reviewed the patients History and Physical, chart, labs and discussed the procedure including the risks, benefits and alternatives for the proposed anesthesia with the patient or authorized representative who has indicated his/her understanding and acceptance.     Dental advisory given  Plan Discussed with: CRNA  Anesthesia Plan Comments:        Anesthesia Quick Evaluation

## 2020-07-17 NOTE — Op Note (Signed)
07/17/2020  12:13 PM  PATIENT:  [name]  86 y.o. female  PRE-OPERATIVE DIAGNOSIS:  CHRONIC CHOLECYSTITIS  POST-OPERATIVE DIAGNOSIS:  chronic cholecystitis  PROCEDURE:  Procedure(s): LAPAROSCOPIC CHOLECYSTECTOMY (N/A)  SURGEON:  Surgeon(s) and Role:    * Ralene Ok, MD - Primary  ASSISTANTS: Pryor Curia, RNFA   ANESTHESIA:   local and general  EBL:  minimal   BLOOD ADMINISTERED:none  DRAINS: none   LOCAL MEDICATIONS USED:  BUPIVICAINE   SPECIMEN:  Source of Specimen:  gallbladder  DISPOSITION OF SPECIMEN:  PATHOLOGY  COUNTS:  YES  TOURNIQUET:  * No tourniquets in log *  DICTATION: .Dragon Dictation The patient was taken to the operating and placed in the supine position with bilateral SCDs in place.  The patient was prepped and draped in the usual sterile fashion. A time out was called and all facts were verified. A pneumoperitoneum was obtained via A Veress needle technique to a pressure of 2mm of mercury.  A 51mm trochar was then placed in the right upper quadrant under visualization, and there were no injuries to any abdominal organs. A 11 mm port was then placed in the umbilical region after infiltrating with local anesthesia under direct visualization. A second and third epigastric port and right lower quadrant port placement under direct visualization, respectively.    There was a large amount of omentum tucked over the liver.  This appeared to cover the gallbaldder.  This was dissected away. The gallbladder was identified  and seen scarred up to the diaphragm.  It was grasped and retracted, the peritoneum was then sharply dissected from the gallbladder and this dissection was carried down to Calot's triangle. The gallbladder was identified and stripped away circumferentially and seen going into the gallbladder 360, the critical angle was obtained.  2 clips were placed proximally one distally and the cystic duct transected. The cystic artery was  identified and 2 clips placed proximally and one distally and transected.  We then proceeded to remove the gallbladder off the diaphragm and the superior surface of the liver with Bovie cautery. A retrieval bag was then placed in the abdomen and gallbladder placed in the bag. The hepatic fossa was then reexamined and hemostasis was achieved with Bovie cautery and was excellent at the end of the case.   The subhepatic fossa and perihepatic fossa was then irrigated until the effluent was clear.  The gallbladder and bag were removed from the abdominal cavity. The 11 mm trocar fascia was reapproximated with the Endo Close #1 Vicryl x2.  The pneumoperitoneum was evacuated and all trochars removed under direct visulalization.  The skin was then closed with 4-0 Monocryl and the skin dressed with Dermabond.    The patient was awaken from general anesthesia and taken to the recovery room in stable condition.   PLAN OF CARE: Discharge to home after PACU  PATIENT DISPOSITION:  PACU - hemodynamically stable.   Delay start of Pharmacological VTE agent (>24hrs) due to surgical blood loss or risk of bleeding: not applicable

## 2020-07-17 NOTE — Anesthesia Procedure Notes (Signed)
Procedure Name: Intubation Date/Time: 07/17/2020 11:22 AM Performed by: Amadeo Garnet, CRNA Pre-anesthesia Checklist: Patient identified, Emergency Drugs available, Suction available and Patient being monitored Patient Re-evaluated:Patient Re-evaluated prior to induction Oxygen Delivery Method: Circle system utilized Preoxygenation: Pre-oxygenation with 100% oxygen Induction Type: IV induction Ventilation: Mask ventilation without difficulty Laryngoscope Size: Mac and 3 Grade View: Grade I Tube type: Oral Tube size: 7.0 mm Number of attempts: 1 Airway Equipment and Method: Stylet and Oral airway Placement Confirmation: ETT inserted through vocal cords under direct vision,  positive ETCO2 and breath sounds checked- equal and bilateral Secured at: 21 cm Tube secured with: Tape Dental Injury: Teeth and Oropharynx as per pre-operative assessment

## 2020-07-17 NOTE — Transfer of Care (Signed)
Immediate Anesthesia Transfer of Care Note  Patient: [name]  Procedure(s) Performed: LAPAROSCOPIC CHOLECYSTECTOMY (N/A Abdomen)  Patient Location: PACU  Anesthesia Type:General  Level of Consciousness: awake, alert  and oriented  Airway & Oxygen Therapy: Patient Spontanous Breathing  Post-op Assessment: Report given to RN, Post -op Vital signs reviewed and stable and Patient moving all extremities  Post vital signs: Reviewed and stable  Last Vitals:  Vitals Value Taken Time  BP 165/74 07/17/20 1231  Temp 36.3 C 07/17/20 1230  Pulse 67 07/17/20 1238  Resp 13 07/17/20 1238  SpO2 98 % 07/17/20 1238  Vitals shown include unvalidated device data.  Last Pain:  Vitals:   07/17/20 0952  TempSrc:   PainSc: 2       Patients Stated Pain Goal: 2 (09/62/83 6629)  Complications: No complications documented.

## 2020-07-17 NOTE — Discharge Instructions (Signed)
[facility] ______[facility] °LAPAROSCOPIC SURGERY: POST OP INSTRUCTIONS °Always review your discharge instruction sheet given to you by the facility where your surgery was performed. °IF YOU HAVE DISABILITY OR FAMILY LEAVE FORMS, YOU MUST BRING THEM TO THE OFFICE FOR PROCESSING.   °DO NOT GIVE THEM TO YOUR DOCTOR. ° °1. A prescription for pain medication may be given to you upon discharge.  Take your pain medication as prescribed, if needed.  If narcotic pain medicine is not needed, then you may take acetaminophen (Tylenol) or ibuprofen (Advil) as needed. °2. Take your usually prescribed medications unless otherwise directed. °3. If you need a refill on your pain medication, please contact your pharmacy.  They will contact our office to request authorization. Prescriptions will not be filled after 5pm or on week-ends. °4. You should follow a light diet the first few days after arrival home, such as soup and crackers, etc.  Be sure to include lots of fluids daily. °5. Most patients will experience some swelling and bruising in the area of the incisions.  Ice packs will help.  Swelling and bruising can take several days to resolve.  °6. It is common to experience some constipation if taking pain medication after surgery.  Increasing fluid intake and taking a stool softener (such as Colace) will usually help or prevent this problem from occurring.  A mild laxative (Milk of Magnesia or Miralax) should be taken according to package instructions if there are no bowel movements after 48 hours. °7. Unless discharge instructions indicate otherwise, you may remove your bandages 24-48 hours after surgery, and you may shower at that time.  You may have steri-strips (small skin tapes) in place directly over the incision.  These strips should be left on the skin for 7-10 days.  If your surgeon used skin glue on the incision, you may shower in 24 hours.  The glue will flake off over the next 2-3 weeks.  Any sutures or  staples will be removed at the office during your follow-up visit. °8. ACTIVITIES:  You may resume regular (light) daily activities beginning the next day--such as daily self-care, walking, climbing stairs--gradually increasing activities as tolerated.  You may have sexual intercourse when it is comfortable.  Refrain from any heavy lifting or straining until approved by your doctor. °a. You may drive when you are no longer taking prescription pain medication, you can comfortably wear a seatbelt, and you can safely maneuver your car and apply brakes. °b. RETURN TO WORK:  __________________________________________________________ °9. You should see your doctor in the office for a follow-up appointment approximately 2-3 weeks after your surgery.  Make sure that you call for this appointment within a day or two after you arrive home to insure a convenient appointment time. °10. OTHER INSTRUCTIONS: __________________________________________________________________________________________________________________________ __________________________________________________________________________________________________________________________ °WHEN TO CALL YOUR DOCTOR: °1. Fever over 101.0 °2. Inability to urinate °3. Continued bleeding from incision. °4. Increased pain, redness, or drainage from the incision. °5. Increasing abdominal pain ° °The clinic staff is available to answer your questions during regular business hours.  Please don’t hesitate to call and ask to speak to one of the nurses for clinical concerns.  If you have a medical emergency, go to the nearest emergency room or call 911.  A surgeon from [facility] is always on call at the hospital. °[address], [address], [place], [place]  [address] ? [address] [address], [place], [place]   [address] °[phone] ? [phone] ? FAX [phone] °Web site:   [email or web address] °

## 2020-07-17 NOTE — H&P (Signed)
  History of Present Illness Mackenzie Ok MD; 06/06/2020 10:03 AM) The patient is a 86 year old female who presents for evaluation of gall stones. Patient's follow back up today after HIDA scan is been repleted. HIDA scan did reveal nonvisualization of the gallbladder, likely consistent with chronic cholecystitis. Patient states that she's had less pain, has been staying on a low-fat diet with good relief pain. Patient states she is in favor of surgery if this would help her continue with normal food.     Allergies [name], [place]; 06/06/2020 9:52 AM) No Known Drug Allergies  [05/21/2020]: Allergies Reconciled   Medication History [name], CMA; 06/06/2020 9:52 AM) Zofran (4MG  Tablet, Oral) Active. Simvastatin (40MG  Tablet, Oral) Active. Protonix (40MG  Tablet DR, Oral) Active. Sulindac (200MG  Tablet, Oral) Active. ZyrTEC (10MG  Tablet, Oral) Active. Vitamin D (Oral) Specific strength unknown - Active. Medications Reconciled    Review of Systems Mackenzie Ok, MD; 06/06/2020 10:4 AM) General Present- Feeling well. Not Present- Fever. Skin Not Present- Change in Wart/Mole, Dryness, Hives, Jaundice, New Lesions, Non-Healing Wounds, Rash and Ulcer. HEENT Present- Seasonal Allergies, Sinus Pain and Wears glasses/contact lenses. Not Present- Earache, Hearing Loss, Hoarseness, Nose Bleed, Oral Ulcers, Ringing in the Ears, Sore Throat, Visual Disturbances and Yellow Eyes. Respiratory Not Present- Cough and Difficulty Breathing. Cardiovascular Not Present- Chest Pain. Gastrointestinal Present- Abdominal Pain and Nausea. Female Genitourinary Not Present- Frequency, Nocturia, Painful Urination, Pelvic Pain and Urgency. Musculoskeletal Not Present- Myalgia. Neurological Not Present- Weakness. Psychiatric Present- Anxiety. Not Present- Bipolar, Change in Sleep Pattern, Depression, Fearful and Frequent crying. Endocrine Present- Hair Changes. Not Present- Cold  Intolerance, Excessive Hunger, Heat Intolerance, Hot flashes and New Diabetes. All other systems negative  Vitals [name] CMA; 06/06/2020 9:52 AM) 06/06/2020 9:51 AM Weight: 141 lb Height: 61in Body Surface Area: 1.63 m Body Mass Index: 26.64 kg/m  Temp.: 97.84F  Pulse: 66 (Regular)  BP: 158/88(Sitting, Left Arm, Standard)       Physical Exam Mackenzie Ok, MD; 06/06/2020 10:4 AM) The physical exam findings are as follows: Note: Constitutional: No acute distress, conversant, appears stated age  Eyes: Anicteric sclerae, moist conjunctiva, no lid lag  Neck: No thyromegaly, trachea midline, no cervical lymphadenopathy  Lungs: Clear to auscultation biilaterally, normal respiratory effot  Cardiovascular: regular rate & rhythm, no murmurs, no peripheal edema, pedal pulses 2+  GI: Soft, no masses or hepatosplenomegaly, tender to palpation 2 right upper quadrant/right lower quadrant area.  MSK: Normal gait, no clubbing cyanosis, edema  Skin: No rashes, palpation reveals normal skin turgor  Psychiatric: Appropriate judgment and insight, oriented to person, place, and time    Assessment & Plan Mackenzie Ok MD; 06/06/2020 10:04 AM) CHRONIC CHOLECYSTITIS (K81.1) Impression: Patient is an 86 year old female with a history of hyperlipidemia, with cholecystitis. Patient is in favor proceeding with laparoscopic cholecystectomy.   1. We will proceed to the operating room for a laparoscopic cholecystectomy  2. Risks and benefits were discussed with the patient to generally include, but not limited to: infection, bleeding, possible need for post op ERCP, damage to the bile ducts, bile leak, and possible need for further surgery. Alternatives were offered and described. All questions were answered and the patient voiced understanding of the procedure and wishes to proceed at this point with a laparoscopic cholecystectomy

## 2020-07-18 ENCOUNTER — Encounter (HOSPITAL_COMMUNITY): Payer: Self-pay | Admitting: General Surgery

## 2020-07-18 LAB — SURGICAL PATHOLOGY

## 2020-08-12 ENCOUNTER — Ambulatory Visit (INDEPENDENT_AMBULATORY_CARE_PROVIDER_SITE_OTHER): Payer: Medicare Other | Admitting: Internal Medicine

## 2020-08-12 ENCOUNTER — Encounter: Payer: Self-pay | Admitting: Internal Medicine

## 2020-08-12 ENCOUNTER — Other Ambulatory Visit: Payer: Self-pay

## 2020-08-12 DIAGNOSIS — J310 Chronic rhinitis: Secondary | ICD-10-CM

## 2020-08-12 DIAGNOSIS — K219 Gastro-esophageal reflux disease without esophagitis: Secondary | ICD-10-CM

## 2020-08-12 DIAGNOSIS — E7800 Pure hypercholesterolemia, unspecified: Secondary | ICD-10-CM

## 2020-08-12 DIAGNOSIS — R058 Other specified cough: Secondary | ICD-10-CM | POA: Diagnosis not present

## 2020-08-12 DIAGNOSIS — I10 Essential (primary) hypertension: Secondary | ICD-10-CM

## 2020-08-12 DIAGNOSIS — Z0000 Encounter for general adult medical examination without abnormal findings: Secondary | ICD-10-CM

## 2020-08-12 LAB — CBC WITH DIFFERENTIAL/PLATELET
Basophils Absolute: 0 10*3/uL (ref 0.0–0.1)
Basophils Relative: 0.8 % (ref 0.0–3.0)
Eosinophils Absolute: 0.2 10*3/uL (ref 0.0–0.7)
Eosinophils Relative: 3.6 % (ref 0.0–5.0)
HCT: 35.7 % — ABNORMAL LOW (ref 36.0–46.0)
Hemoglobin: 12 g/dL (ref 12.0–15.0)
Lymphocytes Relative: 34.9 % (ref 12.0–46.0)
Lymphs Abs: 1.7 10*3/uL (ref 0.7–4.0)
MCHC: 33.6 g/dL (ref 30.0–36.0)
MCV: 96.5 fl (ref 78.0–100.0)
Monocytes Absolute: 0.3 10*3/uL (ref 0.1–1.0)
Monocytes Relative: 6.5 % (ref 3.0–12.0)
Neutro Abs: 2.7 10*3/uL (ref 1.4–7.7)
Neutrophils Relative %: 54.2 % (ref 43.0–77.0)
Platelets: 228 10*3/uL (ref 150.0–400.0)
RBC: 3.7 Mil/uL — ABNORMAL LOW (ref 3.87–5.11)
RDW: 14.4 % (ref 11.5–15.5)
WBC: 4.9 10*3/uL (ref 4.0–10.5)

## 2020-08-12 LAB — TSH: TSH: 2.08 u[IU]/mL (ref 0.35–4.50)

## 2020-08-12 LAB — BASIC METABOLIC PANEL
BUN: 19 mg/dL (ref 6–23)
CO2: 27 mEq/L (ref 19–32)
Calcium: 9.7 mg/dL (ref 8.4–10.5)
Chloride: 107 mEq/L (ref 96–112)
Creatinine, Ser: 0.81 mg/dL (ref 0.40–1.20)
GFR: 65.17 mL/min (ref >60.00)
Glucose, Bld: 89 mg/dL (ref 70–99)
Potassium: 4.1 mEq/L (ref 3.5–5.1)
Sodium: 142 mEq/L (ref 135–145)

## 2020-08-12 LAB — LIPID PANEL
Cholesterol: 300 mg/dL — ABNORMAL HIGH (ref 0–200)
HDL: 76 mg/dL (ref >39.00)
LDL Cholesterol: 206 mg/dL — ABNORMAL HIGH (ref 0–99)
NonHDL: 224.38
Total CHOL/HDL Ratio: 4
Triglycerides: 92 mg/dL (ref 0.0–149.0)
VLDL: 18.4 mg/dL (ref 0.0–40.0)

## 2020-08-12 LAB — HEPATIC FUNCTION PANEL
ALT: 10 U/L (ref 0–35)
AST: 13 U/L (ref 0–37)
Albumin: 4.1 g/dL (ref 3.5–5.2)
Alkaline Phosphatase: 63 U/L (ref 39–117)
Bilirubin, Direct: 0.1 mg/dL (ref 0.0–0.3)
Total Bilirubin: 0.7 mg/dL (ref 0.2–1.2)
Total Protein: 6.4 g/dL (ref 6.0–8.3)

## 2020-08-12 NOTE — Progress Notes (Addendum)
Subjective:    Patient ID: [name], female   DOB: 1931/08/25    MRN: [id number]   Brief patient profile:  73  yobf remote minimal smoking hx (quit 1957)  with seasonal rhinits flares better overall since started on immunotherapy May2008 completed aug 2018 >  Followed in pulmonary clinic with hyperlipidemia/djd     History of Present Illness  10/06/2017  f/u ov/[name] re: hyperlipidemia/ chronic rhinitis no long seeing allergy/bardelas Chief Complaint  Patient presents with  . Annual Exam    Pt is fasting. She is c/o nasal congestion and sneezing.   worse nasal symptoms x 3-4 weeks just using flonase / not zyrtec  No reg ex but Not limited by breathing from desired activities  rec  Take flonase consistently and supplement with the zyrtec up to 10 mg daily > if not effective return to Dr Shaune Leeks  Please remember to go to the lab department downstairs in the basement  for your tests - we will call you with the results when they are available.      08/14/2019  f/u ov/[name] re: yearly eval rhinitis with pnds/cough  Hyperlipidemia/ borderline hbp/ djd  Chief Complaint  Patient presents with  . Annual Exam    Pt is fasting.   Dyspnea:  Not limited by breathing from desired activities   Cough: mostly just throat clearing daytime s excess mucus prod Sleeping: flat/ one pillow  SABA use: none 02: none  Some daytime hb depending on what she eats  Using avg zocor sev times a week  No tia/ claudications rec No change rx   08/12/2020  f/u ov/[name] re: rhinitis/ pnds / hyperlipidemia / gerd  Chief Complaint  Patient presents with  . Follow-up    Gallbladder surgery on 11/11 maybe check liver and kidney blood work and bone density test, diet sheet for gerd. Has lost 15 over 6 months  Dyspnea:  Not limited by breathing from desired activities   Cough: none/ min pnds controlled with prn zyrtec, not needing as much in last year  Sleeping: flat bed/ 2 pillows  SABA use: none  02:  none   No obvious day to day or daytime variability or assoc excess/ purulent sputum or mucus plugs or hemoptysis or cp or chest tightness, subjective wheeze or overt sinus or hb symptoms.   Sleeping as above without nocturnal  or early am exacerbation  of respiratory  c/o's or need for noct saba. Also denies any obvious fluctuation of symptoms with weather or environmental changes or other aggravating or alleviating factors except as outlined above   No unusual exposure hx or h/o childhood pna/ asthma or knowledge of premature birth.  Current Allergies, Complete Past Medical History, Past Surgical History, Family History, and Social History were reviewed in [employer] record.  ROS  The following are not active complaints unless bolded Hoarseness, sore throat, dysphagia, dental problems, itching, sneezing,  nasal congestion or discharge of excess mucus or purulent secretions, ear ache,   fever, chills, sweats, unintended wt loss or wt gain, classically pleuritic or exertional cp,  orthopnea pnd or arm/hand swelling  or leg swelling, presyncope, palpitations, abdominal pain, anorexia, nausea, vomiting, diarrhea  or change in bowel habits or change in bladder habits, change in stools or change in urine, dysuria, hematuria,  rash, arthralgias, visual complaints, headache, numbness, weakness or ataxia or problems with walking or coordination,  change in mood or  memory.        Current  Meds  Medication Sig  . acetaminophen (TYLENOL) 500 MG tablet Take 500 mg by mouth every 6 (six) hours as needed (pain).   . Carboxymethylcellul-Glycerin (LUBRICATING EYE DROPS OP) Place 1 drop into both eyes daily as needed (dry eyes).  . cetirizine (ZYRTEC) 10 MG tablet Take 10 mg by mouth at bedtime as needed (drippy nose, drainage, sneezing).   . clotrimazole-betamethasone (LOTRISONE) cream Apply 1 application topically daily as needed for irritation.  . hydrocortisone 2.5 % cream Apply 1  application topically 2 (two) times daily as needed (itching).   . pantoprazole (PROTONIX) 40 MG tablet TAKE 1 TABLET BY MOUTH EVERY DAY (Patient taking differently: Take 40 mg by mouth daily. )  . simvastatin (ZOCOR) 40 MG tablet TAKE 1 TABLET BY MOUTH  DAILY (Patient taking differently: Take 40 mg by mouth daily. )  . traMADol (ULTRAM) 50 MG tablet Take 1 tablet (50 mg total) by mouth every 6 (six) hours as needed.  . Vitamin D, Cholecalciferol, 400 units TABS Take 400 Units by mouth daily.                    Past Medical History:  ARTHRITIS (ICD-716.90)  Right clavicular head swelling chronic/ new R shoulder pain 06/14/2017  .......................Marland KitchenDr Elwin Sleight Hand Pain.................................................Marland Kitchen   Kuzma RHINITIS, CHRONIC (ICD-472.0).................. [place] [facility] / Bardel[facility] - on immunotherapy since 01/2007 - stopped 04/2017  HYPERLIPIDEMIA (ICD-272.4)  -target LDL less than 160 > try off due to musle aches March 18, 2010 > rechallenged with half dose fall 2011 and tol ok  MORBID OBESITY (ICD-278.01)  - ideal 142 Target wt = 158 for BMI < 30  COLON POLYPS  - colonoscopy 06/24/2006 and 10/08/11 >> no recall due to age  OSTEOPENIA  -BMD 08/07/08 tspine -1.2, Left femur -.4, right femur +.2  - Repeat 01/02/2010 1.9 0.1 0.5  Health Maintenance.......................................................Marland KitchenWert  - CPX 08/12/2020   - Td 09/2003 > 03/27/2013  - Pneumovax 2000 age 67  And prevnar 13 02/27/15  - GYN [name] ([place])      Family History:  Family History Colon Cancer in mother > neg colon @ f/u 2013 > no further f/u per Dr Olevia Perches  she is an only child and knows nothing about her father's health      Social History:  Negative history of passive tobacco smoke exposure.  Exercise-2 times weekly  Caffeine-2 cups daily  Married with 4 children  Risk Factors:  Smoking Status: quit Age 39      Objective:   Physical Exam  amb pleasant bf nad    08/12/2020   138 08/14/2019  148  wt 172 August 16, 2008 > 173 December 13, 2008 > 166 02/11/2011 >  03/21/2012  169 > 08/02/2012  165> 162 01/11/2013 >164 6/18/ 14 > 03/27/2013  163  > 05/31/2013 165> 06/11/14 163>  08/15/14  165 > 02/14/2015  162 > 02/27/2015   162 > 05/28/15 161> 08/12/2015   160 > 11/13/2015  159 >  03/02/2016   159  > 08/17/2016  160 > 06/14/2017    155 > 10/06/2017  155 > 04/25/2018   155 > 07/27/2018  151 >    Vital signs reviewed  08/12/2020  - Note at rest 02 sats  98% on RA      Reports: full dentures    HEENT : pt wearing mask not removed for exam due to covid -19 concerns.    NECK :  without JVD/Nodes/TM/ nl  carotid upstrokes bilaterally   LUNGS: no acc muscle use,  Nl contour chest which is clear to A and P bilaterally without cough on insp or exp maneuvers   CV:  RRR  no s3 or murmur or increase in P2, and no edema   ABD:  soft and nontender with nl inspiratory excursion in the supine position. No bruits or organomegaly appreciated, bowel sounds nl  MS:  Nl gait/ ext warm without deformities, calf tenderness, cyanosis or clubbing No obvious joint restrictions   SKIN: warm and dry without lesions    NEURO:  alert, approp, nl sensorium with  no motor or cerebellar deficits apparent.     Labs ordered/ reviewed:      Chemistry      Component Value Date/Time   NA 142 08/12/2020 1042   K 4.1 08/12/2020 1042   CL 107 08/12/2020 1042   CO2 27 08/12/2020 1042   BUN 19 08/12/2020 1042   CREATININE 0.81 08/12/2020 1042   CREATININE 0.82 07/16/2014 0947      Component Value Date/Time   CALCIUM 9.7 08/12/2020 1042   ALKPHOS 63 08/12/2020 1042   AST 13 08/12/2020 1042   ALT 10 08/12/2020 1042   BILITOT 0.7 08/12/2020 1042        Lab Results  Component Value Date   WBC 4.9 08/12/2020   HGB 12.0 08/12/2020   HCT 35.7 (L) 08/12/2020   MCV 96.5 08/12/2020   PLT 228.0 08/12/2020         Lab Results  Component Value Date   TSH 2.08 08/12/2020                                 Assessment:

## 2020-08-12 NOTE — Assessment & Plan Note (Addendum)
-   Mammos neg 12/14/13  - Mammos neg 01/15/2015  - Mammos neg 02/22/17  - Mammos neg 06/15/18   Up to date on all vaccinations including 3rd covid vaccine   F/u yearly adequate at this point          Each maintenance medication was reviewed in detail including emphasizing most importantly the difference between maintenance and prns and under what circumstances the prns are to be triggered using an action plan format where appropriate.  Total time for H and P, chart review, counseling, teaching device and generating customized AVS unique to this office visit / charting  >  30 min

## 2020-08-12 NOTE — Assessment & Plan Note (Signed)
Target LDL less than 160 > try off statins due to muscle aches March 18, 2010 > rechallenged with half dose fall 2011 and tol ok  - rechallenged with zocor 40 mg daily 05/31/2013 > tolerated well   Lab Results  Component Value Date   CHOL 300 (H) 08/12/2020   HDL 76.00 08/12/2020   LDLCALC 206 (H) 08/12/2020   LDLDIRECT 147.8 07/04/2013   TRIG 92.0 08/12/2020   CHOLHDL 4 08/12/2020     Adequate control on present rx, reviewed in detail with pt > no change in rx needed

## 2020-08-12 NOTE — Patient Instructions (Signed)
Please remember to go to the lab department   for your tests - we will call you with the results when they are available.      No change in medications for now   Please schedule a follow up visit in 12 months but call sooner if needed

## 2020-08-12 NOTE — Assessment & Plan Note (Signed)
Adequate control on present rx, reviewed in detail with pt > no change in rx needed  = zyrtec prn

## 2020-08-12 NOTE — Assessment & Plan Note (Signed)
Adequate control on present rx, reviewed in detail with pt > no change in rx needed   = ppi qam ac

## 2020-08-12 NOTE — Assessment & Plan Note (Signed)
Lab Results  Component Value Date   CREATININE 0.81 08/12/2020   CREATININE 1.09 (H) 04/30/2020   CREATININE 0.88 08/14/2019    Adequate control on present rx, reviewed in detail with pt > no change in rx needed  q rx diet/ exercise only - no meds needed

## 2021-07-15 ENCOUNTER — Other Ambulatory Visit: Payer: Self-pay | Admitting: Gastroenterology

## 2021-08-11 ENCOUNTER — Ambulatory Visit (INDEPENDENT_AMBULATORY_CARE_PROVIDER_SITE_OTHER): Payer: Medicare Other | Admitting: Adult Health

## 2021-08-11 ENCOUNTER — Other Ambulatory Visit: Payer: Self-pay

## 2021-08-11 ENCOUNTER — Encounter: Payer: Self-pay | Admitting: Adult Health

## 2021-08-11 ENCOUNTER — Ambulatory Visit: Payer: Medicare Other | Admitting: Adult Health

## 2021-08-11 ENCOUNTER — Ambulatory Visit: Payer: Medicare Other | Admitting: Internal Medicine

## 2021-08-11 VITALS — BP 124/58 | HR 73 | Temp 97.7°F | Ht 61.0 in | Wt 155.0 lb

## 2021-08-11 DIAGNOSIS — J310 Chronic rhinitis: Secondary | ICD-10-CM | POA: Diagnosis not present

## 2021-08-11 DIAGNOSIS — M129 Arthropathy, unspecified: Secondary | ICD-10-CM

## 2021-08-11 DIAGNOSIS — E7800 Pure hypercholesterolemia, unspecified: Secondary | ICD-10-CM | POA: Diagnosis not present

## 2021-08-11 DIAGNOSIS — Z23 Encounter for immunization: Secondary | ICD-10-CM | POA: Diagnosis not present

## 2021-08-11 DIAGNOSIS — I10 Essential (primary) hypertension: Secondary | ICD-10-CM | POA: Diagnosis not present

## 2021-08-11 DIAGNOSIS — Z0000 Encounter for general adult medical examination without abnormal findings: Secondary | ICD-10-CM

## 2021-08-11 MED ORDER — SIMVASTATIN 40 MG PO TABS
40.0000 mg | ORAL_TABLET | Freq: Every day | ORAL | 3 refills | Status: DC
Start: 2021-08-11 — End: 2022-08-23

## 2021-08-11 NOTE — Progress Notes (Signed)
@Patient  ID: [name], female    DOB: March 30, 1931, 87 y.o.   MRN: [id number]  Chief Complaint  Patient presents with   Annual Exam    Referring provider: [name], MD  HPI: 87 year old female with minimum smoking history followed for primary care with Dr. [name] Medical history significant for hyperlipidemia hypertension and chronic rhinitis  TEST/EVENTS :   08/11/2021 Follow up : HTN, HLD and Chronic Rhinitis  Patient returns for a 1 year follow-up.  Patient has underlying hypertension, hyperlipidemia and chronic rhinitis.  Patient says she does forget her Lipitor on occasion.  We discussed a low-fat cholesterol diet and medication compliance.  She is had borderline blood pressure in the past.  Is not on any medication.  Blood pressure today is 124/58.  She denies any headache chest pain orthopnea PND or increased leg swelling. Takes zyrtec on occasion for nasal congestion and drainage .   Complains of arthritis pain in her hands. Complains of right thumb pain with radiation into elbow for last 2 months . Has seen ortho in past with injection.  Patient remains active and independent.  Lives at home with her husband.  He does have dementia. And chronic foley . -getting worse. Has been touch on her . Her daughter has moved in to help with his care.  She is able to do her housework. Recent mammogram is up-to-date.  And was negative Flu vaccine is up-to-date COVID booster x 4    No Known Allergies  Immunization History  Administered Date(s) Administered   Fluad Quad(high Dose 65+) 05/01/2019, 05/05/2020, 07/07/2021   Influenza Split 06/23/2011, 06/07/2012   Influenza Whole 05/31/2008, 06/20/2009, 05/21/2010   Influenza, High Dose Seasonal PF 06/10/2016, 06/14/2017, 07/27/2018   Influenza,inj,Quad PF,6+ Mos 05/31/2013, 06/11/2014, 05/09/2015   Influenza-Unspecified 05/05/2020   PFIZER(Purple Top)SARS-COV-2 Vaccination 10/13/2019, 11/03/2019   Pneumococcal Conjugate-13  02/27/2015   Pneumococcal Polysaccharide-23 08/07/1999   Td 09/07/2003   Tdap 03/27/2013   Zoster Recombinat (Shingrix) 05/08/2019    Past Medical History:  Diagnosis Date   Arthritis    Arthropathy, unspecified, site unspecified    Dr. [name]...right clavicular head swelling 2003   Colon polyp    colonoscopy 06-24-06 with no adenomatous change, hyperplasia  only   Environmental allergies    GERD (gastroesophageal reflux disease)    Healthcare maintenance    CPX 12-24-09, Td 1/05, pneumovax 2000 age 14, GYN [name] ([place])   Hyperlipidemia    target less than 160 > try off muscle aches 03-18-10   Morbid obesity ([place])    ideal = 142.  target wt = 158 for BMI < 30   Osteopenia    BMD 08-07-08 tspin - 1.2, left femur - .4, right femur +.2.  repeat 01-02-10  1.9    0.1   0.5   Rhinitis, chronic    Dr. [name]...on immunotherapy since 5/08    Tobacco History: Social History   Tobacco Use  Smoking Status Former   Packs/day: 0.50   Years: 5.00   Pack years: 2.50   Types: Cigarettes   Quit date: 09/07/1955   Years since quitting: 65.9  Smokeless Tobacco Never   Counseling given: Not Answered   Outpatient Medications Prior to Visit  Medication Sig Dispense Refill   acetaminophen (TYLENOL) 500 MG tablet Take 500 mg by mouth every 6 (six) hours as needed (pain).      Carboxymethylcellul-Glycerin (LUBRICATING EYE DROPS OP) Place 1 drop into both eyes daily as needed (dry eyes).  cetirizine (ZYRTEC) 10 MG tablet Take 10 mg by mouth at bedtime as needed (drippy nose, drainage, sneezing).      clotrimazole-betamethasone (LOTRISONE) cream Apply 1 application topically daily as needed for irritation.     hydrocortisone 2.5 % cream Apply 1 application topically 2 (two) times daily as needed (itching).      pantoprazole (PROTONIX) 40 MG tablet TAKE 1 TABLET BY MOUTH EVERY DAY (Patient taking differently: Take 40 mg by mouth daily as needed.) 30 tablet 5   simvastatin (ZOCOR) 40  MG tablet TAKE 1 TABLET BY MOUTH  DAILY (Patient not taking: Reported on 08/11/2021) 90 tablet 3   Vitamin D, Cholecalciferol, 400 units TABS Take 400 Units by mouth daily.  (Patient not taking: Reported on 08/11/2021)     No facility-administered medications prior to visit.     Review of Systems:   Constitutional:   No  weight loss, night sweats,  Fevers, chills,  +fatigue, or  lassitude.  HEENT:   No headaches,  Difficulty swallowing,  Tooth/dental problems, or  Sore throat,                No sneezing, itching, ear ache, nasal congestion, post nasal drip,   CV:  No chest pain,  Orthopnea, PND, swelling in lower extremities, anasarca, dizziness, palpitations, syncope.   GI  No heartburn, indigestion, abdominal pain, nausea, vomiting, diarrhea, change in bowel habits, loss of appetite, bloody stools.   Resp: .  No chest wall deformity  Skin: no rash or lesions.  GU: no dysuria, change in color of urine, no urgency or frequency.  No flank pain, no hematuria   MS:  No joint pain or swelling.  No decreased range of motion.  No back pain.    Physical Exam  BP (!) 124/58 (BP Location: Left Arm, Cuff Size: Normal)   Pulse 73   Temp 97.7 F (36.5 C) (Oral)   Ht 5\' 1"  (1.549 m)   Wt 155 lb (70.3 kg)   SpO2 96% Comment: on RA  BMI 29.29 kg/m   GEN: A/Ox3; pleasant , NAD, well nourished    HEENT:  [place]/AT,   NOSE-clear, THROAT-clear, no lesions, no postnasal drip or exudate noted.   NECK:  Supple w/ fair ROM; no JVD; normal carotid impulses w/o bruits; no thyromegaly or nodules palpated; no lymphadenopathy.    RESP  Clear  P & A; w/o, wheezes/ rales/ or rhonchi. no accessory muscle use, no dullness to percussion  CARD:  RRR, no m/r/g, no peripheral edema, pulses intact, no cyanosis or clubbing.  GI:   Soft & nt; nml bowel sounds; no organomegaly or masses detected.   Musco: Warm bil, arthritic changes in hands   Neuro: alert, no focal deficits noted.    Skin: Warm, no  lesions or rashes    Lab Results:      BNP No results found for: BNP    Imaging: No results found.    No flowsheet data found.  No results found for: NITRICOXIDE      Assessment & Plan:   Arthropathy Arthritic changes in bilateral hands.  Patient is advised on supportive care with ice heat and Tylenol.  If not improving will need to go to orthopedics.  Essential hypertension Controlled on current regimen.  Not requiring medications.  Patient is advised a low-salt diet.  Health care maintenance Mammogram is up-to-date.   Influenza  vaccine is up-to-date.,  Pneumonia vaccine today.  RHINITIS, CHRONIC Continue on Zyrtec daily as needed  Hyperlipidemia Continue on Zocor.  Patient is encouraged on compliance.  Low-fat low-cholesterol diet.  Labs are pending     Rexene Edison, NP 08/11/2021

## 2021-08-11 NOTE — Addendum Note (Signed)
Addended by: Rosana Berger on: 08/11/2021 04:42 PM   Modules accepted: Orders

## 2021-08-11 NOTE — Assessment & Plan Note (Signed)
Controlled on current regimen.  Not requiring medications.  Patient is advised a low-salt diet.

## 2021-08-11 NOTE — Assessment & Plan Note (Signed)
Continue on Zocor.  Patient is encouraged on compliance.  Low-fat low-cholesterol diet.  Labs are pending

## 2021-08-11 NOTE — Assessment & Plan Note (Signed)
Continue on Zyrtec daily as needed

## 2021-08-11 NOTE — Assessment & Plan Note (Signed)
Arthritic changes in bilateral hands.  Patient is advised on supportive care with ice heat and Tylenol.  If not improving will need to go to orthopedics.

## 2021-08-11 NOTE — Patient Instructions (Addendum)
Labs today . Zyrtec 10mg  At bedtime  As needed  drainage.  Ice and heat to hands As needed   Tylenol As needed   Go to see your orthopedic if hand pain does not improve.  Try to take Zocor everyday .  Low fat and cholesterol diet .  Pneumovax vaccine today .  Follow up with Dr. [name]  in 1 year and As needed

## 2021-08-11 NOTE — Assessment & Plan Note (Signed)
Mammogram is up-to-date.   Influenza  vaccine is up-to-date.,  Pneumonia vaccine today.

## 2021-08-12 LAB — HEPATIC FUNCTION PANEL
ALT: 15 U/L (ref 0–35)
AST: 19 U/L (ref 0–37)
Albumin: 4 g/dL (ref 3.5–5.2)
Alkaline Phosphatase: 59 U/L (ref 39–117)
Bilirubin, Direct: 0.1 mg/dL (ref 0.0–0.3)
Total Bilirubin: 0.5 mg/dL (ref 0.2–1.2)
Total Protein: 6.6 g/dL (ref 6.0–8.3)

## 2021-08-12 LAB — BASIC METABOLIC PANEL
BUN: 22 mg/dL (ref 6–23)
CO2: 28 mEq/L (ref 19–32)
Calcium: 9.5 mg/dL (ref 8.4–10.5)
Chloride: 106 mEq/L (ref 96–112)
Creatinine, Ser: 0.91 mg/dL (ref 0.40–1.20)
GFR: 56.27 mL/min — ABNORMAL LOW (ref >60.00)
Glucose, Bld: 62 mg/dL — ABNORMAL LOW (ref 70–99)
Potassium: 4.1 mEq/L (ref 3.5–5.1)
Sodium: 140 mEq/L (ref 135–145)

## 2021-08-12 LAB — CBC WITH DIFFERENTIAL/PLATELET
Basophils Absolute: 0 10*3/uL (ref 0.0–0.1)
Basophils Relative: 0.8 % (ref 0.0–3.0)
Eosinophils Absolute: 0.2 10*3/uL (ref 0.0–0.7)
Eosinophils Relative: 4.8 % (ref 0.0–5.0)
HCT: 36.2 % (ref 36.0–46.0)
Hemoglobin: 11.7 g/dL — ABNORMAL LOW (ref 12.0–15.0)
Lymphocytes Relative: 31.1 % (ref 12.0–46.0)
Lymphs Abs: 1.6 10*3/uL (ref 0.7–4.0)
MCHC: 32.2 g/dL (ref 30.0–36.0)
MCV: 98.5 fl (ref 78.0–100.0)
Monocytes Absolute: 0.5 10*3/uL (ref 0.1–1.0)
Monocytes Relative: 9.3 % (ref 3.0–12.0)
Neutro Abs: 2.8 10*3/uL (ref 1.4–7.7)
Neutrophils Relative %: 54 % (ref 43.0–77.0)
Platelets: 238 10*3/uL (ref 150.0–400.0)
RBC: 3.67 Mil/uL — ABNORMAL LOW (ref 3.87–5.11)
RDW: 13.5 % (ref 11.5–15.5)
WBC: 5.2 10*3/uL (ref 4.0–10.5)

## 2021-08-12 LAB — LIPID PANEL
Cholesterol: 269 mg/dL — ABNORMAL HIGH (ref 0–200)
HDL: 78.8 mg/dL (ref >39.00)
LDL Cholesterol: 158 mg/dL — ABNORMAL HIGH (ref 0–99)
NonHDL: 190.45
Total CHOL/HDL Ratio: 3
Triglycerides: 161 mg/dL — ABNORMAL HIGH (ref 0.0–149.0)
VLDL: 32.2 mg/dL (ref 0.0–40.0)

## 2021-08-17 NOTE — Progress Notes (Signed)
Called and spoke with patient, provided results/recommendations per Rexene Edison NP, she verbalized understanding.  Nothing further needed.

## 2021-11-25 ENCOUNTER — Encounter: Payer: Self-pay | Admitting: Gastroenterology

## 2022-02-03 ENCOUNTER — Other Ambulatory Visit: Payer: Self-pay | Admitting: Gastroenterology

## 2022-04-29 ENCOUNTER — Ambulatory Visit: Payer: Medicare Other | Admitting: Internal Medicine

## 2022-04-29 ENCOUNTER — Encounter: Payer: Self-pay | Admitting: Internal Medicine

## 2022-04-29 ENCOUNTER — Ambulatory Visit (INDEPENDENT_AMBULATORY_CARE_PROVIDER_SITE_OTHER): Payer: Medicare Other

## 2022-04-29 DIAGNOSIS — J310 Chronic rhinitis: Secondary | ICD-10-CM | POA: Diagnosis not present

## 2022-04-29 DIAGNOSIS — R058 Other specified cough: Secondary | ICD-10-CM

## 2022-04-29 DIAGNOSIS — M79662 Pain in left lower leg: Secondary | ICD-10-CM | POA: Diagnosis not present

## 2022-04-29 DIAGNOSIS — M129 Arthropathy, unspecified: Secondary | ICD-10-CM

## 2022-04-29 DIAGNOSIS — E7800 Pure hypercholesterolemia, unspecified: Secondary | ICD-10-CM

## 2022-04-29 DIAGNOSIS — I10 Essential (primary) hypertension: Secondary | ICD-10-CM | POA: Diagnosis not present

## 2022-04-29 DIAGNOSIS — M7989 Other specified soft tissue disorders: Secondary | ICD-10-CM | POA: Insufficient documentation

## 2022-04-29 LAB — HEPATIC FUNCTION PANEL
ALT: 13 U/L (ref 0–35)
AST: 16 U/L (ref 0–37)
Albumin: 4.4 g/dL (ref 3.5–5.2)
Alkaline Phosphatase: 63 U/L (ref 39–117)
Bilirubin, Direct: 0.2 mg/dL (ref 0.0–0.3)
Total Bilirubin: 0.9 mg/dL (ref 0.2–1.2)
Total Protein: 7.3 g/dL (ref 6.0–8.3)

## 2022-04-29 LAB — LIPID PANEL
Cholesterol: 243 mg/dL — ABNORMAL HIGH (ref 0–200)
HDL: 80.9 mg/dL (ref >39.00)
LDL Cholesterol: 142 mg/dL — ABNORMAL HIGH (ref 0–99)
NonHDL: 161.96
Total CHOL/HDL Ratio: 3
Triglycerides: 102 mg/dL (ref 0.0–149.0)
VLDL: 20.4 mg/dL (ref 0.0–40.0)

## 2022-04-29 LAB — CBC WITH DIFFERENTIAL/PLATELET
Basophils Absolute: 0 10*3/uL (ref 0.0–0.1)
Basophils Relative: 0.7 % (ref 0.0–3.0)
Eosinophils Absolute: 0.1 10*3/uL (ref 0.0–0.7)
Eosinophils Relative: 2.7 % (ref 0.0–5.0)
HCT: 37.7 % (ref 36.0–46.0)
Hemoglobin: 12.3 g/dL (ref 12.0–15.0)
Lymphocytes Relative: 38.1 % (ref 12.0–46.0)
Lymphs Abs: 1.9 10*3/uL (ref 0.7–4.0)
MCHC: 32.7 g/dL (ref 30.0–36.0)
MCV: 98.2 fl (ref 78.0–100.0)
Monocytes Absolute: 0.4 10*3/uL (ref 0.1–1.0)
Monocytes Relative: 8.1 % (ref 3.0–12.0)
Neutro Abs: 2.5 10*3/uL (ref 1.4–7.7)
Neutrophils Relative %: 50.4 % (ref 43.0–77.0)
Platelets: 234 10*3/uL (ref 150.0–400.0)
RBC: 3.84 Mil/uL — ABNORMAL LOW (ref 3.87–5.11)
RDW: 14 % (ref 11.5–15.5)
WBC: 4.9 10*3/uL (ref 4.0–10.5)

## 2022-04-29 LAB — BASIC METABOLIC PANEL
BUN: 18 mg/dL (ref 6–23)
CO2: 27 mEq/L (ref 19–32)
Calcium: 9.7 mg/dL (ref 8.4–10.5)
Chloride: 104 mEq/L (ref 96–112)
Creatinine, Ser: 0.84 mg/dL (ref 0.40–1.20)
GFR: 61.64 mL/min (ref >60.00)
Glucose, Bld: 87 mg/dL (ref 70–99)
Potassium: 3.7 mEq/L (ref 3.5–5.1)
Sodium: 138 mEq/L (ref 135–145)

## 2022-04-29 LAB — TSH: TSH: 1.34 u[IU]/mL (ref 0.35–5.50)

## 2022-04-29 LAB — SEDIMENTATION RATE: Sed Rate: 26 mm/hr (ref 0–30)

## 2022-04-29 LAB — D-DIMER, QUANTITATIVE: D-Dimer, Quant: 0.98 mcg/mL FEU — ABNORMAL HIGH (ref <0.50)

## 2022-04-29 LAB — URIC ACID: Uric Acid, Serum: 4.6 mg/dL (ref 2.4–7.0)

## 2022-04-29 MED ORDER — PREDNISONE 10 MG PO TABS: ORAL_TABLET | ORAL | 0 refills | Status: DC

## 2022-04-29 NOTE — Progress Notes (Signed)
Subjective:    Patient ID: [name], female   DOB: Apr 17, 1931    MRN: [id number]   Brief patient profile:  88  yobf remote minimal smoking hx (quit 1957)  with seasonal rhinits flares better overall since started on immunotherapy May2008 completed aug 2018 >  Followed in pulmonary clinic with hyperlipidemia/djd     History of Present Illness  10/06/2017  f/u ov/[name] re: hyperlipidemia/ chronic rhinitis no long seeing allergy/bardelas Chief Complaint  Patient presents with   Annual Exam    Pt is fasting. She is c/o nasal congestion and sneezing.   worse nasal symptoms x 3-4 weeks just using flonase / not zyrtec  No reg ex but Not limited by breathing from desired activities  rec  Take flonase consistently and supplement with the zyrtec up to 10 mg daily > if not effective return to Dr Shaune Leeks  Please remember to go to the lab department downstairs in the basement  for your tests - we will call you with the results when they are available.  08/12/2020  f/u ov/[name] re: rhinitis/ pnds / hyperlipidemia / gerd  Chief Complaint  Patient presents with   Follow-up    Gallbladder surgery on 11/11 maybe check liver and kidney blood work and bone density test, diet sheet for gerd. Has lost 15 over 6 months  Dyspnea:  Not limited by breathing from desired activities   Cough: none/ min pnds controlled with prn zyrtec, not needing as much in last year  Sleeping: flat bed/ 2 pillows  SABA use: none  02: none Rec No change rx      04/29/2022  f/u ov/[name] re: hyperlipidemia/uacs maint on zyrtec with worse nasal discharge   new L leg swelling x 3 weeks  Chief Complaint  Patient presents with   Follow-up    Follow up. Patient wants to talk about her sinuses and dizzyness.      Dyspnea:  Not limited by breathing from desired activities   Cough: assoc with pnds  Sleeping: flat bed / 2 pillows  SABA use: none  02: none  Covid status: vax x 3/ never infected  No obvious day to day or  daytime variability or assoc excess/ purulent sputum or mucus plugs or hemoptysis or cp or chest tightness, subjective wheeze or overt sinus or hb symptoms.   Sleeping  without nocturnal  or early am exacerbation  of respiratory  c/o's or need for noct saba. Also denies any obvious fluctuation of symptoms with weather or environmental changes or other aggravating or alleviating factors except as outlined above   No unusual exposure hx or h/o childhood pna/ asthma or knowledge of premature birth.  Current Allergies, Complete Past Medical History, Past Surgical History, Family History, and Social History were reviewed in [employer] record.  ROS  The following are not active complaints unless bolded Hoarseness, sore throat, dysphagia, dental problems, itching, sneezing,  nasal congestion or discharge of excess mucus or purulent secretions, ear ache,   fever, chills, sweats, unintended wt loss or wt gain, classically pleuritic or exertional cp,  orthopnea pnd or arm/hand swelling  or leg swelling, presyncope, palpitations, abdominal pain, anorexia, nausea, vomiting, diarrhea  or change in bowel habits or change in bladder habits, change in stools or change in urine, dysuria, hematuria,  rash, arthralgias, visual complaints, headache, numbness, weakness or ataxia or problems with walking or coordination,  change in mood or  memory.        Current Meds  Medication Sig   acetaminophen (TYLENOL) 500 MG tablet Take 500 mg by mouth every 6 (six) hours as needed (pain).    Carboxymethylcellul-Glycerin (LUBRICATING EYE DROPS OP) Place 1 drop into both eyes daily as needed (dry eyes).   cetirizine (ZYRTEC) 10 MG tablet Take 10 mg by mouth at bedtime as needed (drippy nose, drainage, sneezing).    clotrimazole-betamethasone (LOTRISONE) cream Apply 1 application topically daily as needed for irritation.   hydrocortisone 2.5 % cream Apply 1 application topically 2 (two) times daily as  needed (itching).    predniSONE (DELTASONE) 10 MG tablet Take  4 each am x 2 days,   2 each am x 2 days,  1 each am x 2 days and stop   predniSONE (DELTASONE) 10 MG tablet Take  4 each am x 2 days,   2 each am x 2 days,  1 each am x 2 days and stop             Past Medical History:  ARTHRITIS (ICD-716.90)  Right clavicular head swelling chronic/ new R shoulder pain 06/14/2017  .......................Marland KitchenDr Elwin Sleight Hand Pain.................................................Marland Kitchen   [place] RHINITIS, CHRONIC (ICD-472.0).................. [facility] / Bardelas - on immunotherapy since 01/2007 - stopped 04/2017  HYPERLIPIDEMIA (ICD-272.4)  -target LDL less than 160 > try off due to musle aches March 18, 2010 > rechallenged with half dose fall 2011 and tol ok  MORBID OBESITY (ICD-278.01)  - ideal 142 Target wt = 158 for BMI < 30  COLON POLYPS  - colonoscopy 06/24/2006 and 10/08/11 >> no recall due to age  OSTEOPENIA  -BMD 08/07/08 tspine -1.2, Left femur -.4, right femur +.2  - Repeat 01/02/2010 1.9 0.1 0.5  Health Maintenance.......................................................Marland KitchenWert  - CPX 04/29/2022   - Td 09/2003 > 03/27/2013  - Pneumovax 2000 age 88  And prevnar 13 02/27/15  - GYN [name] ([place])      Family History:  Family History Colon Cancer in mother > neg colon @ f/u 2013 > no further f/u per Dr Olevia Perches  she is an only child and knows nothing about her father's health      Social History:  Negative history of passive tobacco smoke exposure.  Exercise-2 times weekly  Caffeine-2 cups daily  Married with 4 children  Risk Factors:  Smoking Status: quit Age 88      Objective:   Physical Exam   Wts  04/29/2022   148  08/12/2020   138 08/14/2019  148  wt 172 August 16, 2008 > 173 December 13, 2008 > 166 02/11/2011 >  03/21/2012  169 > 08/02/2012  165> 162 01/11/2013 >164 6/18/ 14 > 03/27/2013  163  > 05/31/2013 165> 06/11/14 163>  08/15/14  165 > 02/14/2015  162 > 02/27/2015   162 >  05/28/15 161> 08/12/2015   160 > 11/13/2015  159 >  03/02/2016   159  > 08/17/2016  160 > 06/14/2017    155 > 10/06/2017  155 > 04/25/2018   155 > 07/27/2018  151 >     Vital signs reviewed  04/29/2022  - Note at rest 02 sats  98% on RA   General appearance:    amb bf nad     HEENT : Oropharynx  clear/full dentures     Nasal turbinates mild nonspecific turbinate edema    NECK :  without  apparent JVD/ palpable Nodes/TM    LUNGS: no acc muscle use,  Nl contour chest which is clear to A  and P bilaterally without cough on insp or exp maneuvers   CV:  RRR  no s3 or murmur or increase in P2, and  trace to 1+ pitting LLE  - none on R   ABD:  soft and nontender with nl inspiratory excursion in the supine position. No bruits or organomegaly appreciated   MS:  Nl gait/ ext warm without deformities Or obvious joint restrictions  calf tenderness, cyanosis or clubbing    SKIN: warm and dry without lesions    NEURO:  alert, approp, nl sensorium with  no motor or cerebellar deficits apparent.      CXR PA and Lateral:   04/29/2022 :    I personally reviewed images and impression is as follows:     No chf/ mild T kyphosis                                  Assessment:

## 2022-04-29 NOTE — Patient Instructions (Addendum)
Please remember to go to the lab and x-ray department  for your tests - we will call you with the results when they are available.       My office will be contacting you by phone for referral to Internal medicine  - if you don't hear back from my office within one week please call us back or notify us thru MyChart and we'll address it right away.   Follow up in this clinic is as needed

## 2022-04-30 ENCOUNTER — Encounter: Payer: Self-pay | Admitting: Internal Medicine

## 2022-04-30 LAB — IGE: IgE (Immunoglobulin E), Serum: 1060 kU/L — ABNORMAL HIGH (ref <=114)

## 2022-04-30 NOTE — Assessment & Plan Note (Signed)
Reminded re use of non-mint/menthol hard rock candies to suppress urge to clear throat daytime / f/u rhinitis as above

## 2022-04-30 NOTE — Assessment & Plan Note (Signed)
Allergy immunotherapy per Dr Annamaria Boots since 01/2007  - changed to [facility] allergy/Bardelas 2014  Adequate control on present rx, reviewed in detail with pt > no change in rx needed  > f/u allergy prn

## 2022-04-30 NOTE — Assessment & Plan Note (Signed)
[facility] for otho I [facility]   - ? R Rotator cuff problem 08/25/2011 > referred back to [name] - changed to clinoril 02/14/15 due to hbp   Advised again  Re avoiding nsaids other than clinoril prn

## 2022-04-30 NOTE — Assessment & Plan Note (Signed)
-  target LDL less than 160 > try off statins due to muscle aches March 18, 2010 > rechallenged with half dose fall 2011 and tol ok  - rechallenged with zocor 40 mg daily 05/31/2013 > tolerated well   Lab Results  Component Value Date   CHOL 243 (H) 04/29/2022   HDL 80.90 04/29/2022   LDLCALC 142 (H) 04/29/2022   LDLDIRECT 147.8 07/04/2013   TRIG 102.0 04/29/2022   CHOLHDL 3 04/29/2022    LFTs ok / high HDL very protective Adequate control on present rx, reviewed in detail with pt > no change in rx needed  > continue zocor 40 mg daily

## 2022-04-30 NOTE — Assessment & Plan Note (Signed)
Lab Results  Component Value Date   CREATININE 0.84 04/29/2022   CREATININE 0.91 08/11/2021   CREATININE 0.81 08/12/2020    Marginal control > rec avoid salt, f/u [facility] PCP > referred

## 2022-05-04 ENCOUNTER — Ambulatory Visit (HOSPITAL_COMMUNITY)
Admission: RE | Admit: 2022-05-04 | Discharge: 2022-05-04 | Disposition: A | Discharge status: Home / Self Care | Payer: Medicare Other | Source: Ambulatory Visit | Attending: Internal Medicine | Admitting: Internal Medicine

## 2022-05-04 DIAGNOSIS — M7989 Other specified soft tissue disorders: Secondary | ICD-10-CM | POA: Diagnosis present

## 2022-05-04 DIAGNOSIS — M79662 Pain in left lower leg: Secondary | ICD-10-CM | POA: Insufficient documentation

## 2022-05-04 NOTE — Progress Notes (Signed)
Bilateral LE venous duplex study completed. Please see CV Proc for preliminary results.  French Kendra BS, RVT 05/04/2022 1:37 PM

## 2022-05-04 NOTE — Assessment & Plan Note (Signed)
Onset Aug 2023 - venous dopplers  And uric acid levels ordered 04/29/2022 >>>  F/u with ortho if venous dopplers neg / elevate in meantime/ support hose as tol          Each maintenance medication was reviewed in detail including emphasizing most importantly the difference between maintenance and prns and under what circumstances the prns are to be triggered using an action plan format where appropriate.  Total time for H and P, chart review, counseling,  and generating customized AVS unique to this office visit / same day charting  > 30 min for pt not seen since > 2y with multiple unrelated chronic medical issues

## 2022-05-05 ENCOUNTER — Other Ambulatory Visit: Payer: Self-pay | Admitting: *Deleted

## 2022-05-05 MED ORDER — PREDNISONE 10 MG PO TABS: ORAL_TABLET | ORAL | 0 refills | Status: DC

## 2022-05-05 NOTE — Progress Notes (Signed)
Spoke with pt and notified of results per Dr. Wert. Pt verbalized understanding and denied any questions. 

## 2022-06-11 ENCOUNTER — Ambulatory Visit: Payer: Medicare Other | Admitting: Medical

## 2022-06-25 ENCOUNTER — Ambulatory Visit: Payer: Medicare Other | Admitting: Nurse Practitioner

## 2022-06-25 ENCOUNTER — Encounter: Payer: Self-pay | Admitting: Nurse Practitioner

## 2022-06-25 VITALS — BP 138/68 | HR 66 | Temp 97.6°F | Resp 16 | Ht 62.0 in | Wt 150.0 lb

## 2022-06-25 DIAGNOSIS — E7800 Pure hypercholesterolemia, unspecified: Secondary | ICD-10-CM

## 2022-06-25 DIAGNOSIS — M1711 Unilateral primary osteoarthritis, right knee: Secondary | ICD-10-CM | POA: Diagnosis not present

## 2022-06-25 DIAGNOSIS — M25572 Pain in left ankle and joints of left foot: Secondary | ICD-10-CM

## 2022-06-25 DIAGNOSIS — K219 Gastro-esophageal reflux disease without esophagitis: Secondary | ICD-10-CM | POA: Diagnosis not present

## 2022-06-25 DIAGNOSIS — J3089 Other allergic rhinitis: Secondary | ICD-10-CM

## 2022-06-25 NOTE — Progress Notes (Signed)
Careteam: Patient Care Team: Lauree Chandler, NP as PCP - General (Geriatric Medicine)  PLACE OF SERVICE:  [facility] Directive information Does Patient Have a Medical Advance Directive?: Yes, Type of Advance Directive: Out of facility DNR (pink MOST or yellow form), Does patient want to make changes to medical advance directive?: No - Patient declined  No Known Allergies  Chief Complaint  Patient presents with   [facility] Patient.     HPI: Patient is a 88 y.o. female for new patient appt.   She has had an updated COVID shot and flu shot.   Osteoarthritis- in right knee mostly but she has from head to toe. A lot of pain in her neck.  She goes to spine specialist for her spine.   She also sees orthopedic specialist for arthritis. She has bursitis in right shoulder.    She had some left ankle swelling and pain.   Allergic rhinnitis- taking zyrtec and Flonase, she had allergy shots in the past but does not follow with them at this time.   Has had some pain and itching in vaginal area- went to GYN and has ultrasound scheduled on November 6th.  She is using clotrimazole cream   Hyperlipidemia- on zocor for cholesterol    Review of Systems:  Review of Systems  Constitutional:  Negative for chills, fever and weight loss.  HENT:  Negative for tinnitus.   Respiratory:  Negative for cough, sputum production and shortness of breath.   Cardiovascular:  Negative for chest pain, palpitations and leg swelling.  Gastrointestinal:  Negative for abdominal pain, constipation, diarrhea and heartburn.  Genitourinary:  Negative for dysuria, frequency and urgency.  Musculoskeletal:  Positive for back pain, myalgias and neck pain. Negative for falls and joint pain.  Skin: Negative.   Neurological:  Negative for dizziness and headaches.  Psychiatric/Behavioral:  Negative for depression and memory loss. The patient does not have insomnia.     Past Medical  History:  Diagnosis Date   Arthritis    Arthropathy, unspecified, site unspecified    Dr. [name]...right clavicular head swelling 2003   Colon polyp    colonoscopy 06-24-06 with no adenomatous change, hyperplasia  only   Environmental allergies    GERD (gastroesophageal reflux disease)    Healthcare maintenance    CPX 12-24-09, Td 1/05, pneumovax 2000 age 34, G[name] ([place])   Hyperlipidemia    target less than 160 > try off muscle aches 03-18-10   Morbid obesity ([place])    ideal = 142.  target wt = 158 for BMI < 30   Osteopenia    BMD 08-07-08 tspin - 1.2, left femur - .4, right femur +.2.  repeat 01-02-10  1.9    0.1   0.5   Rhinitis, chronic    Dr. [name]...on immunotherapy since 5/08   Past Surgical History:  Procedure Laterality Date   BUNIONECTOMY  1982   bilateral   CHOLECYSTECTOMY N/A 07/17/2020   Procedure: LAPAROSCOPIC CHOLECYSTECTOMY;  Surgeon: Ralene Ok, MD;  Location: [facility];  Service: General;  Laterality: N/A;   FOOT SURGERY  1970   bilateral 5th toes, bones removed   GANGLION CYST EXCISION  2000   3rd finger right hand   TUBAL LIGATION  1971   Social History:   reports that she quit smoking about 66 years ago. Her smoking use included cigarettes. She has a 2.50 pack-year smoking history. She has never used smokeless tobacco.  She reports that she does not drink alcohol and does not use drugs.  Family History  Problem Relation Age of Onset   Colon cancer Mother        (in computer for recall colonoscopy 10/12)   Healthy Son    Healthy Daughter        x3    Medications: Patient's Medications  New Prescriptions   No medications on file  Previous Medications   ACETAMINOPHEN (TYLENOL) 500 MG TABLET    Take 500 mg by mouth every 6 (six) hours as needed (pain).    CARBOXYMETHYLCELLUL-GLYCERIN (LUBRICATING EYE DROPS OP)    Place 1 drop into both eyes daily as needed (dry eyes).   CETIRIZINE (ZYRTEC) 10 MG TABLET    Take 10 mg by mouth at bedtime  as needed (drippy nose, drainage, sneezing).    CLOTRIMAZOLE-BETAMETHASONE (LOTRISONE) CREAM    Apply 1 application topically daily as needed for irritation.   FLUTICASONE (FLONASE) 50 MCG/ACT NASAL SPRAY    Place 1 spray into both nostrils as needed.   HYDROCORTISONE 2.5 % CREAM    Apply 1 application topically 2 (two) times daily as needed (itching).    SIMVASTATIN (ZOCOR) 40 MG TABLET    Take 1 tablet (40 mg total) by mouth daily.  Modified Medications   No medications on file  Discontinued Medications   PANTOPRAZOLE (PROTONIX) 40 MG TABLET    TAKE 1 TABLET BY MOUTH EVERY DAY   PREDNISONE (DELTASONE) 10 MG TABLET    Take  4 each am x 2 days,   2 each am x 2 days,  1 each am x 2 days and stop   PREDNISONE (DELTASONE) 10 MG TABLET    Take  4 each am x 2 days,   2 each am x 2 days,  1 each am x 2 days and stop    Physical Exam:  Vitals:   06/25/22 1013  BP: 138/68  Pulse: 66  Resp: 16  Temp: 97.6 F (36.4 C)  SpO2: 90%  Weight: 150 lb (68 kg)  Height: '5\' 2"'$  (1.575 m)   Body mass index is 27.44 kg/m. Wt Readings from Last 3 Encounters:  06/25/22 150 lb (68 kg)  04/29/22 148 lb 3.2 oz (67.2 kg)  08/11/21 155 lb (70.3 kg)    Physical Exam Constitutional:      General: She is not in acute distress.    Appearance: She is well-developed. She is not diaphoretic.  HENT:     Head: Normocephalic and atraumatic.     Mouth/Throat:     Pharynx: No oropharyngeal exudate.  Eyes:     Conjunctiva/sclera: Conjunctivae normal.     Pupils: Pupils are equal, round, and reactive to light.  Cardiovascular:     Rate and Rhythm: Normal rate and regular rhythm.     Heart sounds: Normal heart sounds.  Pulmonary:     Effort: Pulmonary effort is normal.     Breath sounds: Normal breath sounds.  Abdominal:     General: Bowel sounds are normal.     Palpations: Abdomen is soft.  Musculoskeletal:     Cervical back: Normal range of motion and neck supple.     Right lower leg: No edema.      Left lower leg: No edema.  Skin:    General: Skin is warm and dry.  Neurological:     Mental Status: She is alert.  Psychiatric:        Mood and Affect: Mood normal.  Labs reviewed: Basic Metabolic Panel: Recent Labs    08/11/21 1632 04/29/22 1412  NA 140 138  K 4.1 3.7  CL 106 104  CO2 28 27  GLUCOSE 62* 87  BUN 22 18  CREATININE 0.91 0.84  CALCIUM 9.5 9.7  TSH  --  1.34   Liver Function Tests: Recent Labs    08/11/21 1632 04/29/22 1414  AST 19 16  ALT 15 13  ALKPHOS 59 63  BILITOT 0.5 0.9  PROT 6.6 7.3  ALBUMIN 4.0 4.4   No results for input(s): "LIPASE", "AMYLASE" in the last 8760 hours. No results for input(s): "AMMONIA" in the last 8760 hours. CBC: Recent Labs    08/11/21 1632 04/29/22 1412  WBC 5.2 4.9  NEUTROABS 2.8 2.5  HGB 11.7* 12.3  HCT 36.2 37.7  MCV 98.5 98.2  PLT 238.0 234.0   Lipid Panel: Recent Labs    08/11/21 1632 04/29/22 1412  CHOL 269* 243*  HDL 78.80 80.90  LDLCALC 158* 142*  TRIG 161.0* 102.0  CHOLHDL 3 3   TSH: Recent Labs    04/29/22 1412  TSH 1.34   A1C: No results found for: "HGBA1C"   Assessment/Plan 1. Primary osteoarthritis of right knee -ongoing, okay to use tylenol 650 mg by mouth every 6 hours as needed  2. Pure hypercholesterolemia -elevated LDL, she continues on  zocor 40 mg daily   3. Other allergic rhinitis Stable at this time, to continue on zyrtec and flonase.   4. Gastroesophageal reflux disease, unspecified whether esophagitis present Controlled with dietary modifications  5. Acute left ankle pain -she has followed with vascular and neurology. -recommend using ice TID  -can voltaren gel PRN -brace for support.     Return in about 6 months (around 12/25/2022) for routine follow up . [employer]. [place], [facility] [phone]

## 2022-06-25 NOTE — Patient Instructions (Addendum)
To schedule AWV after Dec 6th   To use ice to ankle 2- 3 times daily Can use volten gel as needed between when you ice  Can use brace to left ankle

## 2022-07-21 ENCOUNTER — Encounter: Payer: Self-pay | Admitting: Gastroenterology

## 2022-08-20 ENCOUNTER — Ambulatory Visit (INDEPENDENT_AMBULATORY_CARE_PROVIDER_SITE_OTHER): Payer: Medicare Other | Admitting: Nurse Practitioner

## 2022-08-20 ENCOUNTER — Encounter: Payer: Self-pay | Admitting: Nurse Practitioner

## 2022-08-20 VITALS — BP 126/80 | HR 78 | Temp 96.8°F | Resp 16 | Ht 62.0 in | Wt 143.8 lb

## 2022-08-20 DIAGNOSIS — Z0000 Encounter for general adult medical examination without abnormal findings: Secondary | ICD-10-CM

## 2022-08-20 DIAGNOSIS — E7800 Pure hypercholesterolemia, unspecified: Secondary | ICD-10-CM | POA: Diagnosis not present

## 2022-08-20 NOTE — Patient Instructions (Signed)
[name] , Thank you for taking time to come for your Medicare Wellness Visit. I appreciate your ongoing commitment to your health goals. Please review the following plan we discussed and let me know if I can assist you in the future.   Screening recommendations/referrals: Colonoscopy aged out Mammogram aged out but up to date Bone Density up to date Recommended yearly ophthalmology/optometry visit for glaucoma screening and checkup Recommended yearly dental visit for hygiene and checkup  Vaccinations: Influenza vaccine- due annually in September/October Pneumococcal vaccine up to date Tdap vaccine up to date Shingles vaccine RECOMMENDED to get at the pharmacy    Advanced directives: on file  Conditions/risks identified: advanced age, fall risk  Next appointment: yearly for awv   Preventive Care 56 Years and Older, Female Preventive care refers to lifestyle choices and visits with your health care provider that can promote health and wellness. What does preventive care include? A yearly physical exam. This is also called an annual well check. Dental exams once or twice a year. Routine eye exams. Ask your health care provider how often you should have your eyes checked. Personal lifestyle choices, including: Daily care of your teeth and gums. Regular physical activity. Eating a healthy diet. Avoiding tobacco and drug use. Limiting alcohol use. Practicing safe sex. Taking low-dose aspirin every day. Taking vitamin and mineral supplements as recommended by your health care provider. What happens during an annual well check? The services and screenings done by your health care provider during your annual well check will depend on your age, overall health, lifestyle risk factors, and family history of disease. Counseling  Your health care provider may ask you questions about your: Alcohol use. Tobacco use. Drug use. Emotional well-being. Home and relationship  well-being. Sexual activity. Eating habits. History of falls. Memory and ability to understand (cognition). Work and work Statistician. Reproductive health. Screening  You may have the following tests or measurements: Height, weight, and BMI. Blood pressure. Lipid and cholesterol levels. These may be checked every 5 years, or more frequently if you are over 2 years old. Skin check. Lung cancer screening. You may have this screening every year starting at age 36 if you have a 30-pack-year history of smoking and currently smoke or have quit within the past 15 years. Fecal occult blood test (FOBT) of the stool. You may have this test every year starting at age 40. Flexible sigmoidoscopy or colonoscopy. You may have a sigmoidoscopy every 5 years or a colonoscopy every 10 years starting at age 51. Hepatitis C blood test. Hepatitis B blood test. Sexually transmitted disease (STD) testing. Diabetes screening. This is done by checking your blood sugar (glucose) after you have not eaten for a while (fasting). You may have this done every 1-3 years. Bone density scan. This is done to screen for osteoporosis. You may have this done starting at age 67. Mammogram. This may be done every 1-2 years. Talk to your health care provider about how often you should have regular mammograms. Talk with your health care provider about your test results, treatment options, and if necessary, the need for more tests. Vaccines  Your health care provider may recommend certain vaccines, such as: Influenza vaccine. This is recommended every year. Tetanus, diphtheria, and acellular pertussis (Tdap, Td) vaccine. You may need a Td booster every 10 years. Zoster vaccine. You may need this after age 67. Pneumococcal 13-valent conjugate (PCV13) vaccine. One dose is recommended after age 59. Pneumococcal polysaccharide (PPSV23) vaccine. One dose is recommended  after age 33. Talk to your health care provider about which  screenings and vaccines you need and how often you need them. This information is not intended to replace advice given to you by your health care provider. Make sure you discuss any questions you have with your health care provider. Document Released: 09/19/2015 Document Revised: 05/12/2016 Document Reviewed: 06/24/2015 Elsevier Interactive Patient Education  2017 [employer] Prevention in the Home Falls can cause injuries. They can happen to people of all ages. There are many things you can do to make your home safe and to help prevent falls. What can I do on the outside of my home? Regularly fix the edges of walkways and driveways and fix any cracks. Remove anything that might make you trip as you walk through a door, such as a raised step or threshold. Trim any bushes or trees on the path to your home. Use bright outdoor lighting. Clear any walking paths of anything that might make someone trip, such as rocks or tools. Regularly check to see if handrails are loose or broken. Make sure that both sides of any steps have handrails. Any raised decks and porches should have guardrails on the edges. Have any leaves, snow, or ice cleared regularly. Use sand or salt on walking paths during winter. Clean up any spills in your garage right away. This includes oil or grease spills. What can I do in the bathroom? Use night lights. Install grab bars by the toilet and in the tub and shower. Do not use towel bars as grab bars. Use non-skid mats or decals in the tub or shower. If you need to sit down in the shower, use a plastic, non-slip stool. Keep the floor dry. Clean up any water that spills on the floor as soon as it happens. Remove soap buildup in the tub or shower regularly. Attach bath mats securely with double-sided non-slip rug tape. Do not have throw rugs and other things on the floor that can make you trip. What can I do in the bedroom? Use night lights. Make sure that you have a  light by your bed that is easy to reach. Do not use any sheets or blankets that are too big for your bed. They should not hang down onto the floor. Have a firm chair that has side arms. You can use this for support while you get dressed. Do not have throw rugs and other things on the floor that can make you trip. What can I do in the kitchen? Clean up any spills right away. Avoid walking on wet floors. Keep items that you use a lot in easy-to-reach places. If you need to reach something above you, use a strong step stool that has a grab bar. Keep electrical cords out of the way. Do not use floor polish or wax that makes floors slippery. If you must use wax, use non-skid floor wax. Do not have throw rugs and other things on the floor that can make you trip. What can I do with my stairs? Do not leave any items on the stairs. Make sure that there are handrails on both sides of the stairs and use them. Fix handrails that are broken or loose. Make sure that handrails are as long as the stairways. Check any carpeting to make sure that it is firmly attached to the stairs. Fix any carpet that is loose or worn. Avoid having throw rugs at the top or bottom of the stairs. If you  do have throw rugs, attach them to the floor with carpet tape. Make sure that you have a light switch at the top of the stairs and the bottom of the stairs. If you do not have them, ask someone to add them for you. What else can I do to help prevent falls? Wear shoes that: Do not have high heels. Have rubber bottoms. Are comfortable and fit you well. Are closed at the toe. Do not wear sandals. If you use a stepladder: Make sure that it is fully opened. Do not climb a closed stepladder. Make sure that both sides of the stepladder are locked into place. Ask someone to hold it for you, if possible. Clearly mark and make sure that you can see: Any grab bars or handrails. First and last steps. Where the edge of each step  is. Use tools that help you move around (mobility aids) if they are needed. These include: Canes. Walkers. Scooters. Crutches. Turn on the lights when you go into a dark area. Replace any light bulbs as soon as they burn out. Set up your furniture so you have a clear path. Avoid moving your furniture around. If any of your floors are uneven, fix them. If there are any pets around you, be aware of where they are. Review your medicines with your doctor. Some medicines can make you feel dizzy. This can increase your chance of falling. Ask your doctor what other things that you can do to help prevent falls. This information is not intended to replace advice given to you by your health care provider. Make sure you discuss any questions you have with your health care provider. Document Released: 06/19/2009 Document Revised: 01/29/2016 Document Reviewed: 09/27/2014 Elsevier Interactive Patient Education  2017 [employer].

## 2022-08-20 NOTE — Progress Notes (Signed)
Subjective:   [name] is a 88 y.o. female who presents for Medicare Annual (Subsequent) preventive examination.  Review of Systems     Cardiac Risk Factors include: advanced age (>63mn, >>29women);dyslipidemia     Objective:    Today's Vitals   08/20/22 1304  BP: 126/80  Pulse: 78  Resp: 16  Temp: (!) 96.8 F (36 C)  SpO2: 97%  Weight: 143 lb 12.8 oz (65.2 kg)  Height: '5\' 2"'$  (1.575 m)   Body mass index is 26.3 kg/m.     08/20/2022    1:09 PM 06/25/2022   10:17 AM 07/17/2020    9:46 AM 04/30/2020   11:20 AM 09/19/2018    9:47 AM 05/28/2015   10:40 AM  Advanced Directives  Does Patient Have a Medical Advance Directive? Yes Yes Yes No No Yes  Type of Advance Directive Out of facility DNR (pink MOST or yellow form) Out of facility DNR (pink MOST or yellow form) [place]   [place]  Does patient want to make changes to medical advance directive? No - Patient declined No - Patient declined      Would patient like information on creating a medical advance directive?    Yes (ED - Information included in AVS) Yes (MAU/Ambulatory/Procedural Areas - Information given)     Current Medications (verified) Outpatient Encounter Medications as of 08/20/2022  Medication Sig   acetaminophen (TYLENOL) 500 MG tablet Take 500 mg by mouth every 6 (six) hours as needed (pain).    Carboxymethylcellul-Glycerin (LUBRICATING EYE DROPS OP) Place 1 drop into both eyes daily as needed (dry eyes).   cetirizine (ZYRTEC) 10 MG tablet Take 10 mg by mouth at bedtime as needed (drippy nose, drainage, sneezing).    clotrimazole-betamethasone (LOTRISONE) cream Apply 1 application topically daily as needed for irritation.   fluticasone (FLONASE) 50 MCG/ACT nasal spray Place 1 spray into both nostrils as needed.   hydrocortisone 2.5 % cream Apply 1 application topically 2 (two) times daily as needed (itching).    simvastatin (ZOCOR) 40  MG tablet Take 1 tablet (40 mg total) by mouth daily.   No facility-administered encounter medications on file as of 08/20/2022.    Allergies (verified) Patient has no known allergies.   History: Past Medical History:  Diagnosis Date   Arthritis    Arthropathy, unspecified, site unspecified    Dr. CPatrice Paradise..right clavicular head swelling 2003   Colon polyp    colonoscopy 06-24-06 with no adenomatous change, hyperplasia  only   Environmental allergies    GERD (gastroesophageal reflux disease)    Healthcare maintenance    CPX 12-24-09, Td 1/05, pneumovax 2000 age 89 GYN [name] ([place])   Hyperlipidemia    target less than 160 > try off muscle aches 03-18-10   Morbid obesity (HElmo    ideal = 142.  target wt = 158 for BMI < 30   Osteopenia    BMD 08-07-08 tspin - 1.2, left femur - .4, right femur +.2.  repeat 01-02-10  1.9    0.1   0.5   Rhinitis, chronic    Dr. YOrlean Patten..on immunotherapy since 5/08   Past Surgical History:  Procedure Laterality Date   BUNIONECTOMY  1982   bilateral   CHOLECYSTECTOMY N/A 07/17/2020   Procedure: LAPAROSCOPIC CHOLECYSTECTOMY;  Surgeon: RRalene Ok MD;  [place] [facility] [place]  Service: General;  Laterality: N/A;   FOOT SURGERY  1970   bilateral 5th toes, bones removed  GANGLION CYST EXCISION  2000   3rd finger right hand   TUBAL LIGATION  1971   Family History  Problem Relation Age of Onset   Colon cancer Mother        (in computer for recall colonoscopy 10/12)   Healthy Son    Healthy Daughter        x3   Social History   Socioeconomic History   Marital status: Married    Spouse name: Not on file   Number of children: 4   Years of education: Not on file   Highest education level: Not on file  Occupational History   Not on file  Tobacco Use   Smoking status: Former    Packs/day: 0.50    Years: 5.00    Total pack years: 2.50    Types: Cigarettes    Quit date: 09/07/1955    Years since quitting: 66.9   Smokeless tobacco:  Never  Vaping Use   Vaping Use: Never used  Substance and Sexual Activity   Alcohol use: No    Alcohol/week: 0.0 standard drinks of alcohol   Drug use: No   Sexual activity: Not Currently  Other Topics Concern   Not on file  Social History Narrative   Exercise - 2 times weekly   Caffeine - 2 cups daily   Negative history of passive tobacco smoke exposure   Lives with husband of 38 years in one story home.   Has 4 children.   Retired from [employer].    Social Determinants of Health   Financial Resource Strain: Not on file  Food Insecurity: Not on file  Transportation Needs: Not on file  Physical Activity: Not on file  Stress: Not on file  Social Connections: Not on file    Tobacco Counseling Counseling given: Not Answered   Clinical Intake:  Pre-visit preparation completed: Yes  Pain : No/denies pain     BMI - recorded: 26.3 Nutritional Status: BMI 25 -29 Overweight  How often do you need to have someone help you when you read instructions, pamphlets, or other written materials from your doctor or pharmacy?: 1 - Never  Diabetic?no         Activities of Daily Living    08/20/2022    1:45 PM  In your present state of health, do you have any difficulty performing the following activities:  Hearing? 0  Vision? 0  Difficulty concentrating or making decisions? 0  Walking or climbing stairs? 0  Dressing or bathing? 0  Doing errands, shopping? 0  Preparing Food and eating ? N  Using the Toilet? N  In the past six months, have you accidently leaked urine? N  Do you have problems with loss of bowel control? N  Managing your Medications? N  Managing your Finances? N  Housekeeping or managing your Housekeeping? N    Patient Care Team: Lauree Chandler, NP as PCP - General (Geriatric Medicine)  Indicate any recent Medical Services you may have received from other than [facility] providers in the past year (date may be approximate).     Assessment:    This is a routine wellness examination for [name].  Hearing/Vision screen Hearing Screening - Comments:: No hearing concerns.  Vision Screening - Comments:: No vision concerns. Patient wears prescription glasses and contacts. Patient last eye exam 09/04/2021  Dietary issues and exercise activities discussed: Current Exercise Habits: The patient does not participate in regular exercise at present   Goals Addressed   None  Depression Screen    08/20/2022    1:09 PM  PHQ 2/9 Scores  PHQ - 2 Score 0    Fall Risk    08/20/2022    1:09 PM 06/25/2022   10:17 AM  [place] in the past year? 0 0  Number falls in past yr: 0 0  Injury with Fall? 0 0  Risk for fall due to : No Fall Risks No Fall Risks  Follow up Falls evaluation completed Falls evaluation completed    [place]:  Any stairs in or around the home? No  If so, are there any without handrails?  na Home free of loose throw rugs in walkways, pet beds, electrical cords, etc? Yes  Adequate lighting in your home to reduce risk of falls? Yes   ASSISTIVE DEVICES UTILIZED TO PREVENT FALLS:  Life alert? No  Use of a cane, walker or w/c? No  Grab bars in the bathroom? Yes  Shower chair or bench in shower? Yes  Elevated toilet seat or a handicapped toilet? Yes   TIMED UP AND GO:  Was the test performed? No .    Gait steady and fast without use of assistive device  Cognitive Function:    08/20/2022    1:10 PM  MMSE - Mini Mental State Exam  Orientation to time 5  Orientation to Place 5  Registration 3  Attention/ Calculation 5  Recall 2  Language- name 2 objects 2  Language- repeat 1  Language- follow 3 step command 3  Language- read & follow direction 1  Write a sentence 1  Copy design 0  Total score 28        Immunizations Immunization History  Administered Date(s) Administered   Covid-19, Mrna,Vaccine(Spikevax)6yr and older 06/11/2022   Fluad  Quad(high Dose 65+) 05/01/2019, 05/05/2020, 07/07/2021   Influenza Split 06/23/2011, 06/07/2012   Influenza Whole 05/31/2008, 06/20/2009, 05/21/2010   Influenza, High Dose Seasonal PF 06/10/2016, 06/14/2017, 07/27/2018, 06/11/2022   Influenza,inj,Quad PF,6+ Mos 05/31/2013, 06/11/2014, 05/09/2015   Influenza-Unspecified 05/05/2020   PFIZER(Purple Top)SARS-COV-2 Vaccination 10/13/2019, 11/03/2019, 06/24/2020, 01/17/2021   PNEUMOCOCCAL CONJUGATE-20 08/11/2021   Pfizer Covid-19 Vaccine Bivalent Booster 19yr& up 05/21/2021   Pneumococcal Conjugate-13 02/27/2015   Pneumococcal Polysaccharide-23 08/07/1999   Td 09/07/2003   Tdap 03/27/2013   Zoster Recombinat (Shingrix) 05/08/2019    TDAP status: Up to date  Flu Vaccine status: Up to date  Pneumococcal vaccine status: Up to date  Covid-19 vaccine status: Information provided on how to obtain vaccines.   Qualifies for Shingles Vaccine? Yes   Zostavax completed No   Shingrix Completed?: No.    Education has been provided regarding the importance of this vaccine. Patient has been advised to call insurance company to determine out of pocket expense if they have not yet received this vaccine. Advised may also receive vaccine at local pharmacy or Health Dept. Verbalized acceptance and understanding.  Screening Tests Health Maintenance  Topic Date Due   Zoster Vaccines- Shingrix (2 of 2) 07/03/2019   DTaP/Tdap/Td (3 - Td or Tdap) 03/28/2023   Medicare Annual Wellness (AWV)  08/21/2023   Pneumonia Vaccine 6540Years old  Completed   INFLUENZA VACCINE  Completed   DEXA SCAN  Completed   COVID-19 Vaccine  Completed   HPV VACCINES  Aged Out    Health Maintenance  Health Maintenance Due  Topic Date Due   Zoster Vaccines- Shingrix (2 of 2) 07/03/2019    Colorectal  cancer screening: No longer required.   Mammogram status: No longer required due to age.  Bone Density status: Completed 04/27/21. Results reflect: Bone density results:  NORMAL. Repeat every 5 years.  Lung Cancer Screening: (Low Dose CT Chest recommended if Age 70-80 years, 30 pack-year currently smoking OR have quit w/in 15years.) does not qualify.   Lung Cancer Screening Referral: na  Additional Screening:  Hepatitis C Screening: does not qualify  Vision Screening: Recommended annual ophthalmology exams for early detection of glaucoma and other disorders of the eye. Is the patient up to date with their annual eye exam?  Yes  Who is the provider or what is the name of the office in which the patient attends annual eye exams? [facility] If pt is not established with a provider, would they like to be referred to a provider to establish care? No .   Dental Screening: Recommended annual dental exams for proper oral hygiene  Community Resource Referral / Chronic Care Management: CRR required this visit?  No   CCM required this visit?  No      Plan:     I have personally reviewed and noted the following in the patient's chart:   Medical and social history Use of alcohol, tobacco or illicit drugs  Current medications and supplements including opioid prescriptions. Patient is not currently taking opioid prescriptions. Functional ability and status Nutritional status Physical activity Advanced directives List of other physicians Hospitalizations, surgeries, and ER visits in previous 12 months Vitals Screenings to include cognitive, depression, and falls Referrals and appointments  In addition, I have reviewed and discussed with patient certain preventive protocols, quality metrics, and best practice recommendations. A written personalized care plan for preventive services as well as general preventive health recommendations were provided to patient.     Lauree Chandler, NP   08/20/2022   Place of service: [facility]

## 2022-08-21 LAB — COMPLETE METABOLIC PANEL WITH GFR
AG Ratio: 1.8 (calc) (ref 1.0–2.5)
ALT: 10 U/L (ref 6–29)
AST: 14 U/L (ref 10–35)
Albumin: 4.4 g/dL (ref 3.6–5.1)
Alkaline phosphatase (APISO): 67 U/L (ref 37–153)
BUN/Creatinine Ratio: 20 (calc) (ref 6–22)
BUN: 19 mg/dL (ref 7–25)
CO2: 25 mmol/L (ref 20–32)
Calcium: 9.8 mg/dL (ref 8.6–10.4)
Chloride: 105 mmol/L (ref 98–110)
Creat: 0.96 mg/dL — ABNORMAL HIGH (ref 0.60–0.95)
Globulin: 2.4 g/dL (calc) (ref 1.9–3.7)
Glucose, Bld: 82 mg/dL (ref 65–99)
Potassium: 4.4 mmol/L (ref 3.5–5.3)
Sodium: 140 mmol/L (ref 135–146)
Total Bilirubin: 0.9 mg/dL (ref 0.2–1.2)
Total Protein: 6.8 g/dL (ref 6.1–8.1)
eGFR: 57 mL/min/{1.73_m2} — ABNORMAL LOW (ref >=60)

## 2022-08-21 LAB — CBC WITH DIFFERENTIAL/PLATELET
Absolute Monocytes: 685 cells/uL (ref 200–950)
Basophils Absolute: 49 cells/uL (ref 0–200)
Basophils Relative: 1.2 %
Eosinophils Absolute: 8 cells/uL — ABNORMAL LOW (ref 15–500)
Eosinophils Relative: 0.2 %
HCT: 36.4 % (ref 35.0–45.0)
Hemoglobin: 12.6 g/dL (ref 11.7–15.5)
Lymphs Abs: 1041 cells/uL (ref 850–3900)
MCH: 33.3 pg — ABNORMAL HIGH (ref 27.0–33.0)
MCHC: 34.6 g/dL (ref 32.0–36.0)
MCV: 96.3 fL (ref 80.0–100.0)
MPV: 11.2 fL (ref 7.5–12.5)
Monocytes Relative: 16.7 %
Neutro Abs: 2317 cells/uL (ref 1500–7800)
Neutrophils Relative %: 56.5 %
Platelets: 228 10*3/uL (ref 140–400)
RBC: 3.78 10*6/uL — ABNORMAL LOW (ref 3.80–5.10)
RDW: 12.2 % (ref 11.0–15.0)
Total Lymphocyte: 25.4 %
WBC: 4.1 10*3/uL (ref 3.8–10.8)

## 2022-08-21 LAB — LIPID PANEL
Cholesterol: 280 mg/dL — ABNORMAL HIGH (ref <200)
HDL: 77 mg/dL (ref >=50)
LDL Cholesterol (Calc): 185 mg/dL (calc) — ABNORMAL HIGH
Non-HDL Cholesterol (Calc): 203 mg/dL (calc) — ABNORMAL HIGH (ref <130)
Total CHOL/HDL Ratio: 3.6 (calc) (ref <5.0)
Triglycerides: 74 mg/dL (ref <150)

## 2022-08-22 ENCOUNTER — Other Ambulatory Visit: Payer: Self-pay

## 2022-08-22 ENCOUNTER — Encounter (HOSPITAL_BASED_OUTPATIENT_CLINIC_OR_DEPARTMENT_OTHER): Payer: Self-pay | Admitting: Urology

## 2022-08-22 ENCOUNTER — Emergency Department (HOSPITAL_BASED_OUTPATIENT_CLINIC_OR_DEPARTMENT_OTHER)
Admission: EM | Admit: 2022-08-22 | Discharge: 2022-08-22 | Disposition: A | Discharge status: Home / Self Care | Payer: Medicare Other | Attending: Emergency Medicine | Admitting: Emergency Medicine

## 2022-08-22 DIAGNOSIS — U071 COVID-19: Secondary | ICD-10-CM | POA: Diagnosis not present

## 2022-08-22 DIAGNOSIS — R059 Cough, unspecified: Secondary | ICD-10-CM | POA: Diagnosis present

## 2022-08-22 LAB — RESP PANEL BY RT-PCR (RSV, FLU A&B, COVID)  RVPGX2
Influenza A by PCR: NEGATIVE
Influenza B by PCR: NEGATIVE
Resp Syncytial Virus by PCR: NEGATIVE
SARS Coronavirus 2 by RT PCR: POSITIVE — AB

## 2022-08-22 NOTE — Discharge Instructions (Signed)
Return to the emergency department if you develop any new or suddenly worsening symptoms.

## 2022-08-22 NOTE — ED Triage Notes (Signed)
Pt states sinus congestion, runny nose, post nasal drip, and cough that started about 1 week ago  Been taking flonase and zyrtec with help, also taking mucinex at home that is helping  Denies fever

## 2022-08-22 NOTE — ED Provider Notes (Signed)
Emergency Department Provider Note   I have reviewed the triage vital signs and the nursing notes.   HISTORY  Chief Complaint No chief complaint on file.   HPI [name] is a 88 y.o. female with PMH of HLD, GERD, and arthritis presents to the emergency department with cough and congestion symptoms for the past 7 days.  Both she and her husband been sick at home but managing symptoms without seeking medical care.  She is doing well and mainly comes in to have her husband evaluated but notes that she has persistent rhinorrhea and mild cough.  Cough is nonproductive.  No shortness of breath or chest pain.  No abdominal pain, vomiting, diarrhea.  She is eating and drinking normally.  Son at bedside reports that both his parents are at their mental status baseline.   Past Medical History:  Diagnosis Date   Arthritis    Arthropathy, unspecified, site unspecified    Dr. [name]...right clavicular head swelling 2003   Colon polyp    colonoscopy 06-24-06 with no adenomatous change, hyperplasia  only   Environmental allergies    GERD (gastroesophageal reflux disease)    Healthcare maintenance    CPX 12-24-09, Td 1/05, pneumovax 2000 age 89, GYN [name] ([place])   Hyperlipidemia    target less than 160 > try off muscle aches 03-18-10   Morbid obesity ([place])    ideal = 142.  target wt = 158 for BMI < 30   Osteopenia    BMD 08-07-08 tspin - 1.2, left femur - .4, right femur +.2.  repeat 01-02-10  1.9    0.1   0.5   Rhinitis, chronic    Dr. [name]...on immunotherapy since 5/08    Review of Systems  Constitutional: No fever/chills Eyes: No visual changes. ENT: No sore throat. Positive congestion.  Cardiovascular: Denies chest pain. Respiratory: Denies shortness of breath. Positive cough.  Gastrointestinal: No abdominal pain.  No nausea, no vomiting.  No diarrhea.  No constipation. Genitourinary: Negative for dysuria. Musculoskeletal: Negative for back pain. Skin: Negative  for rash. Neurological: Negative for headaches, focal weakness or numbness.  ____________________________________________   PHYSICAL EXAM:  VITAL SIGNS: ED Triage Vitals  Enc Vitals Group     BP 08/22/22 1331 (!) 157/67     Pulse Rate 08/22/22 1331 82     Resp 08/22/22 1331 18     Temp 08/22/22 1331 98.2 F (36.8 C)     Temp Source 08/22/22 1331 Oral     SpO2 08/22/22 1331 100 %     Weight 08/22/22 1329 143 lb 12.8 oz (65.2 kg)     Height 08/22/22 1329 '5\' 2"'$  (1.575 m)    Constitutional: Alert and oriented. Well appearing and in no acute distress. Eyes: Conjunctivae are normal.  Head: Atraumatic. Nose: Mild congestion/rhinnorhea. Mouth/Throat: Mucous membranes are moist.   Neck: No stridor.   Cardiovascular: Normal rate, regular rhythm. Good peripheral circulation. Grossly normal heart sounds.   Respiratory: Normal respiratory effort.  No retractions. Lungs CTAB. Gastrointestinal: Soft and nontender. No distention.  Musculoskeletal: No lower extremity tenderness nor edema. No gross deformities of extremities. Neurologic:  Normal speech and language. No gross focal neurologic deficits are appreciated.  Skin:  Skin is warm, dry and intact. No rash noted.   ____________________________________________   LABS (all labs ordered are listed, but only abnormal results are displayed)  Labs Reviewed  RESP PANEL BY RT-PCR (RSV, FLU A&B, COVID)  [id number] - Abnormal; Notable for the following  components:      Result Value   SARS Coronavirus 2 by RT PCR POSITIVE (*)    All other components within normal limits    ____________________________________________   PROCEDURES  Procedure(s) performed:   Procedures  None  ____________________________________________   INITIAL IMPRESSION / ASSESSMENT AND PLAN / ED COURSE  Pertinent labs & imaging results that were available during my care of the patient were reviewed by me and considered in my medical decision making (see chart  for details).   This patient is Presenting for Evaluation of cough, which does require a range of treatment options, and is a complaint that involves a high risk of morbidity and mortality.  The Differential Diagnoses include COVID, Flu, RSV, CAP, CHF, PE, etc.   I did obtain Additional Historical Information from son at bedside.   Clinical Laboratory Tests Ordered, included COVID PCR which is positive.   Radiologic Tests: Considered CXR but lungs are CTABL.   Social Determinants of Health Risk patient is a non-smoker.   Medical Decision Making: Summary:  Patient presents to the emergency department with cough, congestion symptoms for the last week.  She is outside the timeframe to benefit from Paxlovid.  Looks very well.  Her lungs are clear and she is not hypoxemic or having increased work of breathing.  Defer chest x-ray for now.  Plan for continued supportive care at home and virtual PCP follow-up.  We discussed quarantine and strict ED return precautions.  Patient's presentation is most consistent with acute presentation with potential threat to life or bodily function.   Disposition: discharge  ____________________________________________  FINAL CLINICAL IMPRESSION(S) / ED DIAGNOSES  Final diagnoses:  COVID-19     Note:  This document was prepared using Dragon voice recognition software and may include unintentional dictation errors.  Nanda Quinton, MD, [facility] Emergency Medicine    Davida Falconi, [facility], MD 08/23/22 [phone]

## 2022-08-23 ENCOUNTER — Telehealth: Payer: Self-pay

## 2022-08-23 ENCOUNTER — Other Ambulatory Visit: Payer: Self-pay

## 2022-08-23 DIAGNOSIS — E7800 Pure hypercholesterolemia, unspecified: Secondary | ICD-10-CM

## 2022-08-23 MED ORDER — ROSUVASTATIN CALCIUM 10 MG PO TABS: 10.0000 mg | ORAL_TABLET | Freq: Every day | ORAL | 0 refills | Status: DC

## 2022-08-23 NOTE — Telephone Encounter (Signed)
Transition Care Management Follow-up Telephone Call Date of discharge and from where: 08/22/2022 How have you been since you were released from the hospital? better Any questions or concerns? No  Items Reviewed: Did the pt receive and understand the discharge instructions provided? Yes  Medications obtained and verified? Yes  Other? No  Any new allergies since your discharge? No  Dietary orders reviewed? No Do you have support at home? Yes   Home Care and Equipment/Supplies: Were home health services ordered? not applicable If so, what is the name of the agency? N/a  Has the agency set up a time to come to the patient's home? not applicable Were any new equipment or medical supplies ordered?  No What is the name of the medical supply agency? N/a  Were you able to get the supplies/equipment? not applicable Do you have any questions related to the use of the equipment or supplies? No  Functional Questionnaire: (I = Independent and D = Dependent) ADLs: I  Bathing/Dressing- I  Meal Prep- I  Eating- I  Maintaining continence- I  Transferring/Ambulation- I  Managing Meds- I  Follow up appointments reviewed:  PCP Hospital f/u appt confirmed? Yes  Scheduled to see Durenda Age, NP on 09/10/2022 @ 23PM. [facility] f/u appt confirmed? No  Scheduled to see N/A on N/A @ N/A. Are transportation arrangements needed? No  If their condition worsens, is the pt aware to call PCP or go to the Emergency Dept.? Yes Was the patient provided with contact information for the PCP's office or ED? Yes Was to pt encouraged to call back with questions or concerns? Yes

## 2022-08-25 ENCOUNTER — Ambulatory Visit: Payer: Medicare Other | Admitting: Gastroenterology

## 2022-09-10 ENCOUNTER — Ambulatory Visit: Payer: Medicare Other | Admitting: Adult Health

## 2022-09-22 ENCOUNTER — Ambulatory Visit: Payer: Medicare Other | Admitting: Gastroenterology

## 2022-09-22 ENCOUNTER — Encounter: Payer: Self-pay | Admitting: Gastroenterology

## 2022-09-22 VITALS — BP 138/70 | HR 68 | Ht 62.0 in | Wt 143.0 lb

## 2022-09-22 DIAGNOSIS — K5900 Constipation, unspecified: Secondary | ICD-10-CM | POA: Diagnosis not present

## 2022-09-22 DIAGNOSIS — K219 Gastro-esophageal reflux disease without esophagitis: Secondary | ICD-10-CM | POA: Diagnosis not present

## 2022-09-22 MED ORDER — PANTOPRAZOLE SODIUM 40 MG PO TBEC: 40.0000 mg | DELAYED_RELEASE_TABLET | Freq: Every day | ORAL | 5 refills | Status: DC

## 2022-09-22 NOTE — Patient Instructions (Addendum)
If you are age 87 or older, your body mass index should be between 23-30. Your Body mass index is 26.16 kg/m. If this is out of the aforementioned range listed, please consider follow up with your Primary Care Provider. ________________________________________________________  The [facility] providers would like to encourage you to use [facility] to communicate with providers for non-urgent requests or questions.  Due to long hold times on the telephone, sending your provider a message by [facility] may be a faster and more efficient way to get a response.  Please allow 48 business hours for a response.  Please remember that this is for non-urgent requests.  _______________________________________________________  We have sent the following medications to your pharmacy for you to pick up at your convenience:  RESTART: pantoprazole '40mg'$  one tablet daily.  Please purchase the following medications over the counter and take as directed:  START: Miralax one cupful in 8 ounces of water daily.  You are scheduled to follow up on 10-28-22 at 11:30am  Thank you for entrusting me with your care and choosing [facility].  Alonza Bogus, PA-C

## 2022-09-22 NOTE — Progress Notes (Signed)
09/22/2022 [name] [id number] [birth year]   HISTORY OF PRESENT ILLNESS: This is a pleasant 89 year old female is a patient of Dr. Donneta Romberg although only seen by me in the past.  She not been seen here since 2020.  Previously seen here for issues with epigastric abdominal pain, GERD, dysphagia and constipation.  Upper GI was unremarkable back then when we were evaluating her symptoms.  We started pantoprazole 40 mg daily and MiraLAX.  She took those for a while and when she followed up she was doing well.  Recently, however, her husband has been very sick for the past few years.  She ended up not really following up on any healthcare stuff with herself as she was his caregiver.  She came off of her pantoprazole and her MiraLAX.  He has she just passed away this past December 07, 2022.  She says that she has mixed feelings obviously she will miss them very much as they were married 39 years, but also knows he is in a better place and now can focus on her health as well.  She would like to restart those medications.  She reports occasional reflux, occasional sensation that some food is getting stuck, some intermittent epigastric abdominal pain for the past few months.  Also occasional constipation.  She denies any rectal bleeding.  Her weight has actually been stable.   Past Medical History:  Diagnosis Date   Arthritis    Arthropathy, unspecified, site unspecified    Dr. [name]...right clavicular head swelling 2003   Colon polyp    colonoscopy 06-24-06 with no adenomatous change, hyperplasia  only   Environmental allergies    GERD (gastroesophageal reflux disease)    Healthcare maintenance    CPX 12-24-09, Td 1/05, pneumovax 2000 age 60, GYN [name] ([place])   Hyperlipidemia    target less than 160 > try off muscle aches 03-18-10   Morbid obesity ([place])    ideal = 142.  target wt = 158 for BMI < 30   Osteopenia    BMD 08-07-08 tspin - 1.2, left femur - .4, right femur +.2.  repeat 01-02-10   1.9    0.1   0.5   Rhinitis, chronic    Dr. [name]...on immunotherapy since 5/08   Past Surgical History:  Procedure Laterality Date   BUNIONECTOMY  1982   bilateral   CHOLECYSTECTOMY N/A 07/17/2020   Procedure: LAPAROSCOPIC CHOLECYSTECTOMY;  Surgeon: Ralene Ok, MD;  Location: [facility];  Service: General;  Laterality: N/A;   FOOT SURGERY  1970   bilateral 5th toes, bones removed   GANGLION CYST EXCISION  2000   3rd finger right hand   TUBAL LIGATION  1971    reports that she quit smoking about 67 years ago. Her smoking use included cigarettes. She has a 2.50 pack-year smoking history. She has never used smokeless tobacco. She reports that she does not drink alcohol and does not use drugs. family history includes Colon cancer in her mother; Healthy in her daughter and son. No Known Allergies    Outpatient Encounter Medications as of 09/22/2022  Medication Sig   acetaminophen (TYLENOL) 500 MG tablet Take 500 mg by mouth every 6 (six) hours as needed (pain).    Carboxymethylcellul-Glycerin (LUBRICATING EYE DROPS OP) Place 1 drop into both eyes daily as needed (dry eyes).   cetirizine (ZYRTEC) 10 MG tablet Take 10 mg by mouth at bedtime as needed (drippy nose, drainage, sneezing).    clotrimazole-betamethasone (LOTRISONE) cream Apply 1  application topically daily as needed for irritation.   fluticasone (FLONASE) 50 MCG/ACT nasal spray Place 1 spray into both nostrils as needed.   hydrocortisone 2.5 % cream Apply 1 application topically 2 (two) times daily as needed (itching).    rosuvastatin (CRESTOR) 10 MG tablet Take 1 tablet (10 mg total) by mouth daily.   No facility-administered encounter medications on file as of 09/22/2022.     REVIEW OF SYSTEMS  : All other systems reviewed and negative except where noted in the History of Present Illness.   PHYSICAL EXAM: BP 138/70   Pulse 68   Ht '5\' 2"'$  (1.575 m)   Wt 143 lb (64.9 kg)   SpO2 100%   BMI 26.16 kg/m  General:  Well developed female in no acute distress Head: Normocephalic and atraumatic Eyes:  Sclerae anicteric, conjunctiva pink. Ears: Normal auditory acuity Lungs: Clear throughout to auscultation; no W/R/R. Heart: Regular rate and rhythm; no M/R/G. Abdomen: Soft, non-distended.  BS present.  Non-tender. Musculoskeletal: Symmetrical with no gross deformities  Skin: No lesions on visible extremities Extremities: No edema  Neurological: Alert oriented x 4, grossly non-focal Psychological:  Alert and cooperative. Normal mood and affect  ASSESSMENT AND PLAN: *GERD: Having occasional reflux and epigastric abdominal pain.  She previously did well with pantoprazole 40 mg daily, but has been off of that for quite some time.  Will restart that.  Prescription sent to pharmacy. *Constipation: Same thing with the constipation, she did well with MiraLAX in the past and has been off of that.  Will restart MiraLAX 1 capful mixed in 8 ounces of liquid daily.  **She will follow-up with me in 4 to 6 weeks and see how she is doing.  If any ongoing symptoms could consider CT scan of the abdomen and pelvis.   CC:  Lauree Chandler, NP

## 2022-09-23 NOTE — Progress Notes (Signed)
Attending Physician's Attestation   I have reviewed the chart.   I agree with the Advanced Practitioner's note, impression, and recommendations with any updates as below. I am sorry to hear about the patient's husband passing.  Certainly she is dealing with quite a lot.  Agree with trying to maximize conservative measures at this time and restarting PPI as well as MiraLAX.  If GI symptoms persist/months agree with updated imaging and the potential role of endoscopic evaluation.  Time will tell.   Justice Britain, MD [facility] Advanced Endoscopy Office # [phone]

## 2022-10-28 ENCOUNTER — Ambulatory Visit: Payer: Medicare Other | Admitting: Gastroenterology

## 2022-10-28 ENCOUNTER — Encounter: Payer: Self-pay | Admitting: Gastroenterology

## 2022-10-28 VITALS — BP 124/70 | HR 61 | Ht 62.0 in | Wt 145.0 lb

## 2022-10-28 DIAGNOSIS — K219 Gastro-esophageal reflux disease without esophagitis: Secondary | ICD-10-CM | POA: Diagnosis not present

## 2022-10-28 DIAGNOSIS — K5900 Constipation, unspecified: Secondary | ICD-10-CM | POA: Diagnosis not present

## 2022-10-28 NOTE — Progress Notes (Signed)
10/28/2022 ZARIANA CHARO [id number] Oct 19, 1930   HISTORY OF PRESENT ILLNESS: This is an 89 year old female who is a patient of Dr. Donneta Romberg.  She saw me last month for complaints of reflux and constipation.  She had issues with both of these in the past and had been on pantoprazole and MiraLAX.  She had not been taking those as of late, however.  She just lost her husband so was needing to get back into taking care of herself and getting started back on medications to treat her symptoms.  We restarted her pantoprazole 40 mg daily and MiraLAX.  She is here today for follow-up.  She says that she feels well.  She is taking the pantoprazole every day and is having no issues with heartburn or reflux.  She is not using MiraLAX regularly as an entire capful tends to make things a little bit more soft or loose than she would like, but it is helping.  She has been cooking for herself again, trying to keep herself busy.  She says that eating makes her happy.  Her weight is good.  She is having a 89th birthday party in April.   Past Medical History:  Diagnosis Date   Arthritis    Arthropathy, unspecified, site unspecified    Dr. [name]...right clavicular head swelling 2003   Colon polyp    colonoscopy 06-24-06 with no adenomatous change, hyperplasia  only   Environmental allergies    GERD (gastroesophageal reflux disease)    Healthcare maintenance    CPX 12-24-09, Td 1/05, pneumovax 2000 age 72, GYN [name] ([place])   Hyperlipidemia    target less than 160 > try off muscle aches 03-18-10   Morbid obesity ([place])    ideal = 142.  target wt = 158 for BMI < 30   Osteopenia    BMD 08-07-08 tspin - 1.2, left femur - .4, right femur +.2.  repeat 01-02-10  1.9    0.1   0.5   Rhinitis, chronic    Dr. [name]...on immunotherapy since 5/08   Past Surgical History:  Procedure Laterality Date   BUNIONECTOMY  1982   bilateral   CHOLECYSTECTOMY N/A 07/17/2020   Procedure: LAPAROSCOPIC  CHOLECYSTECTOMY;  Surgeon: Ralene Ok, MD;  Location: [facility];  Service: General;  Laterality: N/A;   FOOT SURGERY  1970   bilateral 5th toes, bones removed   GANGLION CYST EXCISION  2000   3rd finger right hand   TUBAL LIGATION  1971    reports that she quit smoking about 67 years ago. Her smoking use included cigarettes. She has a 2.50 pack-year smoking history. She has never used smokeless tobacco. She reports that she does not drink alcohol and does not use drugs. family history includes Colon cancer in her mother; Healthy in her daughter and son. No Known Allergies    Outpatient Encounter Medications as of 10/28/2022  Medication Sig   acetaminophen (TYLENOL) 500 MG tablet Take 500 mg by mouth every 6 (six) hours as needed (pain).    Carboxymethylcellul-Glycerin (LUBRICATING EYE DROPS OP) Place 1 drop into both eyes daily as needed (dry eyes).   cetirizine (ZYRTEC) 10 MG tablet Take 10 mg by mouth at bedtime as needed (drippy nose, drainage, sneezing).    clotrimazole-betamethasone (LOTRISONE) cream Apply 1 application topically daily as needed for irritation.   fluticasone (FLONASE) 50 MCG/ACT nasal spray Place 1 spray into both nostrils as needed.   hydrocortisone 2.5 % cream Apply 1 application topically  2 (two) times daily as needed (itching).    pantoprazole (PROTONIX) 40 MG tablet Take 1 tablet (40 mg total) by mouth daily.   rosuvastatin (CRESTOR) 10 MG tablet Take 1 tablet (10 mg total) by mouth daily.   No facility-administered encounter medications on file as of 10/28/2022.     REVIEW OF SYSTEMS  : All other systems reviewed and negative except where noted in the History of Present Illness.   PHYSICAL EXAM: BP 124/70   Pulse 61   Ht 5' 2"$  (1.575 m)   Wt 145 lb (65.8 kg)   BMI 26.52 kg/m  General: Well developed AA female in no acute distress Head: Normocephalic and atraumatic Eyes:  Sclerae anicteric, conjunctiva pink. Ears: Normal auditory  acuity Musculoskeletal: Symmetrical with no gross deformities  Skin: No lesions on visible extremities Extremities: No edema  Neurological: Alert oriented x 4, grossly non-focal Psychological:  Alert and cooperative. Normal mood and affect  ASSESSMENT AND PLAN: *GERD: Much improved/resolved on pantoprazole 40 mg daily.  Will continue this current regimen. *Constipation: Much improved/resolved on MiraLAX.  She is not using it daily, however.  Could consider decreasing to half a capful daily if using the full capful is proving to be too much.  **She feels well.  Her weight is stable.  She is trying to keep busy to keep her mind off of the grief from the loss of her husband.  For now we will just plan to have her follow-up as needed for any recurrent or new issues.  CC:  Lauree Chandler, NP

## 2022-10-28 NOTE — Patient Instructions (Signed)
_______________________________________________________  If your blood pressure at your visit was 140/90 or greater, please contact your primary care physician to follow up on this.  _______________________________________________________  If you are age 87 or older, your body mass index should be between 23-30. Your Body mass index is 26.52 kg/m. If this is out of the aforementioned range listed, please consider follow up with your Primary Care Provider.  If you are age 3 or younger, your body mass index should be between 19-25. Your Body mass index is 26.52 kg/m. If this is out of the aformentioned range listed, please consider follow up with your Primary Care Provider.   ________________________________________________________  The [facility] providers would like to encourage you to use [facility] to communicate with providers for non-urgent requests or questions.  Due to long hold times on the telephone, sending your provider a message by [facility] may be a faster and more efficient way to get a response.  Please allow 48 business hours for a response.  Please remember that this is for non-urgent requests.  ____________________________________________________  Continue current regimen and contact us via phone or my chart for any further issues.

## 2022-10-30 NOTE — Progress Notes (Signed)
Attending Physician's Attestation   I have reviewed the chart.   I agree with the Advanced Practitioner's note, impression, and recommendations with any updates as below. Glad to hear that she is doing well.  With this being said, low threshold to perform a diagnostic EGD based on her symptoms (although respect patient wishes at her age to minimize procedural risks).   Justice Britain, MD [facility] Advanced [facility]oscopy Office # [phone]

## 2022-11-19 ENCOUNTER — Other Ambulatory Visit: Payer: Self-pay | Admitting: Nurse Practitioner

## 2022-11-22 ENCOUNTER — Other Ambulatory Visit: Payer: Medicare Other

## 2022-11-22 DIAGNOSIS — E7800 Pure hypercholesterolemia, unspecified: Secondary | ICD-10-CM

## 2022-11-23 LAB — LIPID PANEL
Cholesterol: 206 mg/dL — ABNORMAL HIGH (ref <200)
HDL: 90 mg/dL (ref >=50)
LDL Cholesterol (Calc): 101 mg/dL (calc) — ABNORMAL HIGH
Non-HDL Cholesterol (Calc): 116 mg/dL (calc) (ref <130)
Total CHOL/HDL Ratio: 2.3 (calc) (ref <5.0)
Triglycerides: 61 mg/dL (ref <150)

## 2022-11-23 LAB — COMPLETE METABOLIC PANEL WITH GFR
AG Ratio: 1.8 (calc) (ref 1.0–2.5)
ALT: 11 U/L (ref 6–29)
AST: 14 U/L (ref 10–35)
Albumin: 4.3 g/dL (ref 3.6–5.1)
Alkaline phosphatase (APISO): 64 U/L (ref 37–153)
BUN: 18 mg/dL (ref 7–25)
CO2: 25 mmol/L (ref 20–32)
Calcium: 9.7 mg/dL (ref 8.6–10.4)
Chloride: 110 mmol/L (ref 98–110)
Creat: 0.9 mg/dL (ref 0.60–0.95)
Globulin: 2.4 g/dL (calc) (ref 1.9–3.7)
Glucose, Bld: 88 mg/dL (ref 65–99)
Potassium: 4.5 mmol/L (ref 3.5–5.3)
Sodium: 145 mmol/L (ref 135–146)
Total Bilirubin: 0.6 mg/dL (ref 0.2–1.2)
Total Protein: 6.7 g/dL (ref 6.1–8.1)
eGFR: 61 mL/min/{1.73_m2} (ref >=60)

## 2022-12-06 ENCOUNTER — Encounter: Payer: Self-pay | Admitting: Nurse Practitioner

## 2022-12-06 ENCOUNTER — Ambulatory Visit: Payer: Medicare Other | Admitting: Nurse Practitioner

## 2022-12-06 VITALS — BP 124/80 | HR 78 | Temp 97.6°F | Ht 62.0 in | Wt 145.0 lb

## 2022-12-06 DIAGNOSIS — E7800 Pure hypercholesterolemia, unspecified: Secondary | ICD-10-CM

## 2022-12-06 DIAGNOSIS — G4700 Insomnia, unspecified: Secondary | ICD-10-CM | POA: Diagnosis not present

## 2022-12-06 DIAGNOSIS — M1711 Unilateral primary osteoarthritis, right knee: Secondary | ICD-10-CM | POA: Diagnosis not present

## 2022-12-06 DIAGNOSIS — F4321 Adjustment disorder with depressed mood: Secondary | ICD-10-CM

## 2022-12-06 NOTE — Patient Instructions (Addendum)
Start melatonin 1 mg (over the counter) by mouth at bedtime to help with your sleep cycle.   Make sure you are eating 3 meals a day.  Vit B12 500 mcg supplement by mouth daily to help with energy (over the counter)  COVID booster and Shingrix vaccine at local pharmacy.

## 2022-12-06 NOTE — Progress Notes (Signed)
Careteam: Patient Care Team: Lauree Chandler, NP as PCP - General (Geriatric Medicine)  PLACE OF SERVICE:  [facility] Directive information Does Patient Have a Medical Advance Directive?: Yes, Type of Advance Directive: Living will, Does patient want to make changes to medical advance directive?: No - Patient declined  No Known Allergies  Chief Complaint  Patient presents with   Acute Visit    Mood concerns. Patient with emotional instability following husband expiring. Patient is not sleeping well, c/o decreased energy, and at time patient is unable to express how she feels. Patient would like to discuss vitamins or OTC remedies for energy such as ensure. Patient also c/o change in finger nails x 3 months.  [facility] verified to check care gaps.      HPI: Patient is a 89 y.o. female for follow up She met her husband when she was 41 and he was 86.  They met in July and were married in 6 months.  He passed 1 week before her 67th wedding anniversary.  Reports she does not know how to feel.  She does feel like crying but has a hard time sleeping and figuring out what she wants to do.  She is missing church. Did get to go to church yesterday for Easter.  Having a hard time sleeping   Planning her birthday party on the 13th- turning 19.  Still driving.  Daughter and son still around with her. Has a lot of family support.  Working on a memory album.   Wants to start get out more.   Review of Systems:  Review of Systems  Constitutional:  Negative for chills, fever and weight loss.  HENT:  Negative for tinnitus.   Respiratory:  Negative for cough, sputum production and shortness of breath.   Cardiovascular:  Negative for chest pain, palpitations and leg swelling.  Gastrointestinal:  Negative for abdominal pain, constipation, diarrhea and heartburn.  Genitourinary:  Negative for dysuria, frequency and urgency.  Musculoskeletal:  Negative for back pain, falls, joint pain  and myalgias.  Skin: Negative.   Neurological:  Negative for dizziness and headaches.  Psychiatric/Behavioral:  Negative for depression and memory loss. The patient does not have insomnia.     Past Medical History:  Diagnosis Date   Arthritis    Arthropathy, unspecified, site unspecified    Dr. [name]...right clavicular head swelling 2003   Colon polyp    colonoscopy 06-24-06 with no adenomatous change, hyperplasia  only   Environmental allergies    GERD (gastroesophageal reflux disease)    Healthcare maintenance    CPX 12-24-09, Td 1/05, pneumovax 2000 age 75, GYN [name] ([place])   Hyperlipidemia    target less than 160 > try off muscle aches 03-18-10   Morbid obesity    ideal = 142.  target wt = 158 for BMI < 30   Osteopenia    BMD 08-07-08 tspin - 1.2, left femur - .4, right femur +.2.  repeat 01-02-10  1.9    0.1   0.5   Rhinitis, chronic    Dr. [name]...on immunotherapy since 5/08   Past Surgical History:  Procedure Laterality Date   BUNIONECTOMY  1982   bilateral   CHOLECYSTECTOMY N/A 07/17/2020   Procedure: LAPAROSCOPIC CHOLECYSTECTOMY;  Surgeon: Ralene Ok, MD;  Location: [facility];  Service: General;  Laterality: N/A;   FOOT SURGERY  1970   bilateral 5th toes, bones removed   GANGLION CYST EXCISION  2000   3rd finger  right hand   TUBAL LIGATION  1971   Social History:   reports that she quit smoking about 67 years ago. Her smoking use included cigarettes. She has a 2.50 pack-year smoking history. She has never used smokeless tobacco. She reports that she does not drink alcohol and does not use drugs.  Family History  Problem Relation Age of Onset   Colon cancer Mother        (in computer for recall colonoscopy 10/12)   Healthy Daughter        x3   Healthy Son     Medications: Patient's Medications  New Prescriptions   No medications on file  Previous Medications   ACETAMINOPHEN (TYLENOL) 500 MG TABLET    Take 500 mg by mouth every 6 (six) hours  as needed (pain).    CARBOXYMETHYLCELLUL-GLYCERIN (LUBRICATING EYE DROPS OP)    Place 1 drop into both eyes daily as needed (dry eyes).   CETIRIZINE (ZYRTEC) 10 MG TABLET    Take 10 mg by mouth at bedtime as needed (drippy nose, drainage, sneezing).    CHOLECALCIFEROL (VITAMIN D3) 25 MCG (1000 UT) CAPSULE    Take 1,000 Units by mouth daily.   CLOTRIMAZOLE-BETAMETHASONE (LOTRISONE) CREAM    Apply 1 application topically daily as needed for irritation.   FLUTICASONE (FLONASE) 50 MCG/ACT NASAL SPRAY    Place 1 spray into both nostrils as needed.   HYDROCORTISONE 2.5 % CREAM    Apply 1 application topically 2 (two) times daily as needed (itching).    PANTOPRAZOLE (PROTONIX) 40 MG TABLET    Take 1 tablet (40 mg total) by mouth daily.   ROSUVASTATIN (CRESTOR) 10 MG TABLET    TAKE 1 TABLET BY MOUTH EVERY DAY  Modified Medications   No medications on file  Discontinued Medications   No medications on file    Physical Exam:  Vitals:   12/06/22 0747  BP: 124/80  Pulse: 78  Temp: 97.6 F (36.4 C)  TempSrc: Temporal  SpO2: 99%  Weight: 145 lb (65.8 kg)  Height: 5\' 2"  (1.575 m)   Body mass index is 26.52 kg/m. Wt Readings from Last 3 Encounters:  12/06/22 145 lb (65.8 kg)  10/28/22 145 lb (65.8 kg)  09/22/22 143 lb (64.9 kg)    Physical Exam Constitutional:      General: She is not in acute distress.    Appearance: She is well-developed. She is not diaphoretic.  HENT:     Head: Normocephalic and atraumatic.     Mouth/Throat:     Pharynx: No oropharyngeal exudate.  Eyes:     Conjunctiva/sclera: Conjunctivae normal.     Pupils: Pupils are equal, round, and reactive to light.  Cardiovascular:     Rate and Rhythm: Normal rate and regular rhythm.     Heart sounds: Normal heart sounds.  Pulmonary:     Effort: Pulmonary effort is normal.     Breath sounds: Normal breath sounds.  Abdominal:     General: Bowel sounds are normal.     Palpations: Abdomen is soft.  Musculoskeletal:      Cervical back: Normal range of motion and neck supple.     Right lower leg: No edema.     Left lower leg: No edema.  Skin:    General: Skin is warm and dry.  Neurological:     Mental Status: She is alert.  Psychiatric:        Mood and Affect: Mood normal.     Labs reviewed:  Basic Metabolic Panel: Recent Labs    04/29/22 1412 08/20/22 1352 11/22/22 1334  NA 138 140 145  K 3.7 4.4 4.5  CL 104 105 110  CO2 27 25 25   GLUCOSE 87 82 88  BUN 18 19 18   CREATININE 0.84 0.96* 0.90  CALCIUM 9.7 9.8 9.7  TSH 1.34  --   --    Liver Function Tests: Recent Labs    04/29/22 1414 08/20/22 1352 11/22/22 1334  AST 16 14 14   ALT 13 10 11   ALKPHOS 63  --   --   BILITOT 0.9 0.9 0.6  PROT 7.3 6.8 6.7  ALBUMIN 4.4  --   --    No results for input(s): "LIPASE", "AMYLASE" in the last 8760 hours. No results for input(s): "AMMONIA" in the last 8760 hours. CBC: Recent Labs    04/29/22 1412 08/20/22 1352  WBC 4.9 4.1  NEUTROABS 2.5 2,317  HGB 12.3 12.6  HCT 37.7 36.4  MCV 98.2 96.3  PLT 234.0 228   Lipid Panel: Recent Labs    04/29/22 1412 08/20/22 1352 11/22/22 1334  CHOL 243* 280* 206*  HDL 80.90 77 90  LDLCALC 142* 185* 101*  TRIG 102.0 74 61  CHOLHDL 3 3.6 2.3   TSH: Recent Labs    04/29/22 1412  TSH 1.34   A1C: No results found for: "HGBA1C"   Assessment/Plan 1. Grief -ongoing since the lost of her husband. She is coping appropriately but feeling overwhelmed.  Discussed this and ways to manage. She has a lot of support   2. Insomnia, unspecified type -start melatonin 1 mg by mouth daily at bedtime.   3. Pure hypercholesterolemia Continues on statin, LDL improved on recent labs  4. Primary osteoarthritis of right knee -ongoing, continues supportive care.   Return in about 3 months (around 03/07/2023) for routine follow up.  Carlos American. Harle Battiest [facility] [phone]  Total time 30 min:  time greater than 50% of  total time spent doing pt counseling and coordination of care on grief

## 2022-12-31 ENCOUNTER — Encounter: Payer: Medicare Other | Admitting: Nurse Practitioner

## 2023-03-07 ENCOUNTER — Other Ambulatory Visit: Payer: Self-pay | Admitting: Nurse Practitioner

## 2023-03-09 ENCOUNTER — Ambulatory Visit: Payer: Medicare Other | Admitting: Nurse Practitioner

## 2023-03-09 ENCOUNTER — Encounter: Payer: Self-pay | Admitting: Nurse Practitioner

## 2023-03-09 VITALS — BP 138/82 | HR 63 | Temp 96.9°F | Resp 18 | Ht 62.0 in | Wt 142.0 lb

## 2023-03-09 DIAGNOSIS — K219 Gastro-esophageal reflux disease without esophagitis: Secondary | ICD-10-CM

## 2023-03-09 DIAGNOSIS — G4700 Insomnia, unspecified: Secondary | ICD-10-CM

## 2023-03-09 DIAGNOSIS — F4321 Adjustment disorder with depressed mood: Secondary | ICD-10-CM | POA: Diagnosis not present

## 2023-03-09 DIAGNOSIS — E7800 Pure hypercholesterolemia, unspecified: Secondary | ICD-10-CM | POA: Diagnosis not present

## 2023-03-09 DIAGNOSIS — M1711 Unilateral primary osteoarthritis, right knee: Secondary | ICD-10-CM

## 2023-03-09 DIAGNOSIS — R195 Other fecal abnormalities: Secondary | ICD-10-CM

## 2023-03-09 DIAGNOSIS — R5381 Other malaise: Secondary | ICD-10-CM

## 2023-03-09 DIAGNOSIS — Z66 Do not resuscitate: Secondary | ICD-10-CM

## 2023-03-09 NOTE — Progress Notes (Signed)
Careteam: Patient Care Team: Sharon Seller, NP as PCP - General (Geriatric Medicine)  PLACE OF SERVICE:  [facility] CLINIC  Advanced Directive information    No Known Allergies  Chief Complaint  Patient presents with   Medical Management of Chronic Issues    Patient is here for a 61M F/U for chronic conditions Patient has had 2nd shingrix     HPI: Patient is a 89 y.o. female for routine follow up.  She is feeling more weak and not steady on her feet.  2 days ago her right hand became numb but this has resolved.  Appetite is not good.  Right knee with ongoing issues. Has followed with orthopedic. Has offered her injections.    Not sleeping well- this is ongoing.  She started melatonin but was not consistent with it.  Reports she is dealing with the aftermath of her husbands death and reports she is not dealing with it well.  She has a lot of paperwork to complete in regards to her estate.   Had a great 89th birthday. She had a reunion of people that she had not seen in years.  She has a [place] trip plan in September.   Reports loose stools. This has improved, was happening every time she went to the bathroom.    Review of Systems:  Review of Systems  Constitutional:  Negative for chills, fever and weight loss.  HENT:  Negative for tinnitus.   Respiratory:  Negative for cough, sputum production and shortness of breath.   Cardiovascular:  Negative for chest pain, palpitations and leg swelling.  Gastrointestinal:  Negative for abdominal pain, constipation, diarrhea and heartburn.  Genitourinary:  Negative for dysuria, frequency and urgency.  Musculoskeletal:  Negative for back pain, falls, joint pain and myalgias.  Skin: Negative.   Neurological:  Positive for weakness. Negative for dizziness and headaches.  Psychiatric/Behavioral:  Negative for depression and memory loss. The patient does not have insomnia.     Past Medical History:  Diagnosis Date   Arthritis     Arthropathy, unspecified, site unspecified    Dr. Noel Gerold...right clavicular head swelling 2003   Colon polyp    colonoscopy 06-24-06 with no adenomatous change, hyperplasia  only   Environmental allergies    GERD (gastroesophageal reflux disease)    Healthcare maintenance    CPX 12-24-09, Td 1/05, pneumovax 2000 age 17, GYN [name] ([place])   Hyperlipidemia    target less than 160 > try off muscle aches 03-18-10   Morbid obesity (HCC)    ideal = 142.  target wt = 158 for BMI < 30   Osteopenia    BMD 08-07-08 tspin - 1.2, left femur - .4, right femur +.2.  repeat 01-02-10  1.9    0.1   0.5   Rhinitis, chronic    Dr. Linward Foster...on immunotherapy since 5/08   Past Surgical History:  Procedure Laterality Date   BUNIONECTOMY  1982   bilateral   CHOLECYSTECTOMY N/A 07/17/2020   Procedure: LAPAROSCOPIC CHOLECYSTECTOMY;  Surgeon: Axel Filler, MD;  Location: [facility] OR;  Service: General;  Laterality: N/A;   FOOT SURGERY  1970   bilateral 5th toes, bones removed   GANGLION CYST EXCISION  2000   3rd finger right hand   TUBAL LIGATION  1971   Social History:   reports that she quit smoking about 67 years ago. Her smoking use included cigarettes. She has a 2.50 pack-year smoking history. She has never used smokeless tobacco.  She reports that she does not drink alcohol and does not use drugs.  Family History  Problem Relation Age of Onset   Colon cancer Mother        (in computer for recall colonoscopy 10/12)   Healthy Daughter        x3   Healthy Son     Medications: Patient's Medications  New Prescriptions   No medications on file  Previous Medications   ACETAMINOPHEN (TYLENOL) 500 MG TABLET    Take 500 mg by mouth every 6 (six) hours as needed (pain).    CARBOXYMETHYLCELLUL-GLYCERIN (LUBRICATING EYE DROPS OP)    Place 1 drop into both eyes daily as needed (dry eyes).   CETIRIZINE (ZYRTEC) 10 MG TABLET    Take 10 mg by mouth at bedtime as needed (drippy nose, drainage,  sneezing).    CHOLECALCIFEROL (VITAMIN D3) 25 MCG (1000 UT) CAPSULE    Take 1,000 Units by mouth daily.   CLOTRIMAZOLE-BETAMETHASONE (LOTRISONE) CREAM    Apply 1 application topically daily as needed for irritation.   FLUTICASONE (FLONASE) 50 MCG/ACT NASAL SPRAY    Place 1 spray into both nostrils as needed.   HYDROCORTISONE 2.5 % CREAM    Apply 1 application topically 2 (two) times daily as needed (itching).    PANTOPRAZOLE (PROTONIX) 40 MG TABLET    Take 1 tablet (40 mg total) by mouth daily.   ROSUVASTATIN (CRESTOR) 10 MG TABLET    TAKE 1 TABLET BY MOUTH EVERY DAY  Modified Medications   No medications on file  Discontinued Medications   No medications on file    Physical Exam:  Vitals:   03/09/23 1054  BP: 138/82  Pulse: 63  Resp: 18  Temp: (!) 96.9 F (36.1 C)  SpO2: 94%  Weight: 142 lb (64.4 kg)  Height: 5\' 2"  (1.575 m)   Body mass index is 25.97 kg/m. Wt Readings from Last 3 Encounters:  03/09/23 142 lb (64.4 kg)  12/06/22 145 lb (65.8 kg)  10/28/22 145 lb (65.8 kg)    Physical Exam Constitutional:      General: She is not in acute distress.    Appearance: She is well-developed. She is not diaphoretic.  HENT:     Head: Normocephalic and atraumatic.     Mouth/Throat:     Pharynx: No oropharyngeal exudate.  Eyes:     Conjunctiva/sclera: Conjunctivae normal.     Pupils: Pupils are equal, round, and reactive to light.  Cardiovascular:     Rate and Rhythm: Normal rate and regular rhythm.     Heart sounds: Normal heart sounds.  Pulmonary:     Effort: Pulmonary effort is normal.     Breath sounds: Normal breath sounds.  Abdominal:     General: Bowel sounds are normal.     Palpations: Abdomen is soft.  Musculoskeletal:     Cervical back: Normal range of motion and neck supple.     Right lower leg: No edema.     Left lower leg: No edema.  Skin:    General: Skin is warm and dry.  Neurological:     Mental Status: She is alert and oriented to person, place, and  time.     Motor: Weakness present.  Psychiatric:        Mood and Affect: Mood normal.     Labs reviewed: Basic Metabolic Panel: Recent Labs    04/29/22 1412 08/20/22 1352 11/22/22 1334  NA 138 140 145  K 3.7 4.4 4.5  CL  104 105 110  CO2 27 25 25   GLUCOSE 87 82 88  BUN 18 19 18   CREATININE 0.84 0.96* 0.90  CALCIUM 9.7 9.8 9.7  TSH 1.34  --   --    Liver Function Tests: Recent Labs    04/29/22 1414 08/20/22 1352 11/22/22 1334  AST 16 14 14   ALT 13 10 11   ALKPHOS 63  --   --   BILITOT 0.9 0.9 0.6  PROT 7.3 6.8 6.7  ALBUMIN 4.4  --   --    No results for input(s): "LIPASE", "AMYLASE" in the last 8760 hours. No results for input(s): "AMMONIA" in the last 8760 hours. CBC: Recent Labs    04/29/22 1412 08/20/22 1352  WBC 4.9 4.1  NEUTROABS 2.5 2,317  HGB 12.3 12.6  HCT 37.7 36.4  MCV 98.2 96.3  PLT 234.0 228   Lipid Panel: Recent Labs    04/29/22 1412 08/20/22 1352 11/22/22 1334  CHOL 243* 280* 206*  HDL 80.90 77 90  LDLCALC 142* 185* 101*  TRIG 102.0 74 61  CHOLHDL 3 3.6 2.3   TSH: Recent Labs    04/29/22 1412  TSH 1.34   A1C: No results found for: "HGBA1C"   Assessment/Plan 1. Pure hypercholesterolemia Continues on crestor 10 mg daily, will follow up lipids at next visit.   2. Gastroesophageal reflux disease, unspecified whether esophagitis present -controlled with protonix and dietary modifications.   3. Grief Ongoing, has a lot of family and community support.   4. Primary osteoarthritis of right knee Ongoing, followed by orthopedic as well. To continue with ice, muscle rub PRN - Ambulatory referral to Physical Therapy  5. Insomnia, unspecified type -ongoing due to grief.   6. Debility -progressively worsening debility and balance issues. Plans to increase physical activity but concerns of fall - Ambulatory referral to Physical Therapy for gate and strength training.   7. Loose stools -have improved at this time, ?related to  diet -can use benefiber daily to help bulk stools.   8. DNR (do not resuscitate) - Do not attempt resuscitation (DNR)   Return in about 4 months (around 07/10/2023) for routine follow up.  [name]. [facility]agio [facility] [phone]

## 2023-03-09 NOTE — Patient Instructions (Addendum)
Fiber supplement- benefiber every morning you can put in coffee.

## 2023-03-11 ENCOUNTER — Ambulatory Visit: Payer: Medicare Other | Admitting: Nurse Practitioner

## 2023-03-17 ENCOUNTER — Telehealth: Payer: Self-pay

## 2023-03-17 NOTE — Telephone Encounter (Signed)
Patient called stating that since she was seen in our office on July 3 she has not seemed to have improved. She is still having dizziness and weakness. She states that she is now unable to write with her right hand. She would like to know if you want her to come back in to be evaluated or should she go the the ED. Please advise.  Message sent to Abbey Chatters, NP

## 2023-03-17 NOTE — Telephone Encounter (Signed)
Have her make visit for evaluation

## 2023-03-17 NOTE — Telephone Encounter (Signed)
Called and spoke with patient. She has scheduled appointment for 03/18/23.

## 2023-03-18 ENCOUNTER — Encounter: Payer: Self-pay | Admitting: Nurse Practitioner

## 2023-03-18 ENCOUNTER — Ambulatory Visit: Payer: Medicare Other | Admitting: Nurse Practitioner

## 2023-03-18 VITALS — BP 124/78 | HR 67 | Temp 97.8°F | Ht 62.0 in | Wt 144.0 lb

## 2023-03-18 DIAGNOSIS — M4312 Spondylolisthesis, cervical region: Secondary | ICD-10-CM | POA: Diagnosis not present

## 2023-03-18 DIAGNOSIS — M5412 Radiculopathy, cervical region: Secondary | ICD-10-CM | POA: Diagnosis not present

## 2023-03-18 DIAGNOSIS — M1711 Unilateral primary osteoarthritis, right knee: Secondary | ICD-10-CM | POA: Diagnosis not present

## 2023-03-18 DIAGNOSIS — R42 Dizziness and giddiness: Secondary | ICD-10-CM | POA: Diagnosis not present

## 2023-03-18 DIAGNOSIS — R5381 Other malaise: Secondary | ICD-10-CM

## 2023-03-18 LAB — CBC WITH DIFFERENTIAL/PLATELET
Basophils Absolute: 31 cells/uL (ref 0–200)
Basophils Relative: 0.8 %
Eosinophils Absolute: 191 cells/uL (ref 15–500)
HCT: 35.2 % (ref 35.0–45.0)
Lymphs Abs: 1544 cells/uL (ref 850–3900)
MCH: 32.4 pg (ref 27.0–33.0)
MCHC: 33.5 g/dL (ref 32.0–36.0)
MCV: 96.7 fL (ref 80.0–100.0)
RBC: 3.64 10*6/uL — ABNORMAL LOW (ref 3.80–5.10)
Total Lymphocyte: 39.6 %

## 2023-03-18 MED ORDER — PREDNISONE 10 MG (21) PO TBPK: ORAL_TABLET | ORAL | 0 refills | Status: DC

## 2023-03-18 NOTE — Progress Notes (Signed)
Careteam: Patient Care Team: Sharon Seller, NP as PCP - General (Geriatric Medicine)  PLACE OF SERVICE:  [facility]  Advanced Directive information Does Patient Have a Medical Advance Directive?: Yes, Type of Advance Directive: Healthcare Power of [place];Out of facility DNR (pink MOST or yellow form), Pre-existing out of facility DNR order (yellow form or pink MOST form): Yellow form placed in chart (order not valid for inpatient use), Does patient want to make changes to medical advance directive?: No - Patient declined  No Known Allergies  Chief Complaint  Patient presents with   Acute Visit    Ongoing dizziness and weakness resulting in limitation with right hand since Tuesday (no known injru). Here with daughter On Sunday patient experienced an episode of numbness for a few seconds. Physical therapy in pending for July 30 th. Patient ankles is swollen on right side.      HPI: Patient is a 89 y.o. female for acute visit.   She is feeling jittery/anxious/dizzy. Daughter reports she has been drinking some drink from [place] that is high in caffeine. She feels like she may not be drinking enough water.   She had a nerve study done last August. Overnight in August 2023 she had neuropathy and swelling in right foot. Has a nerve conduction study and lab work which did not reveal anything and it went away after a few weeks.   She has chronic pain in her neck. She has been seen by neurosurgery due to radiculopathy   She also has pain in her right knee- severe OA noted, she was given predinsone at the time and discussed injection after dose pack but did not want to go back for this.   Reports over the last week when she got up she got dizzy Reports she is having trouble with her sinuses.  Using flonase  Overall dizziness is better in the lats week but the weakness persist.  She is not eating 3 meals a day.  Normally a good eater but recently appetite has gone down.   Review  of Systems:  Review of Systems  Constitutional:  Negative for chills, fever and weight loss.  HENT:  Negative for tinnitus.   Respiratory:  Negative for cough, sputum production and shortness of breath.   Cardiovascular:  Negative for chest pain, palpitations and leg swelling.  Gastrointestinal:  Negative for abdominal pain, constipation, diarrhea and heartburn.  Genitourinary:  Negative for dysuria, frequency and urgency.  Musculoskeletal:  Positive for back pain, joint pain, myalgias and neck pain. Negative for falls.  Skin: Negative.   Neurological:  Positive for dizziness and weakness. Negative for headaches.  Psychiatric/Behavioral:  Negative for depression and memory loss. The patient does not have insomnia.     Past Medical History:  Diagnosis Date   Arthritis    Arthropathy, unspecified, site unspecified    Dr. Noel Gerold...right clavicular head swelling 2003   Colon polyp    colonoscopy 06-24-06 with no adenomatous change, hyperplasia  only   Environmental allergies    GERD (gastroesophageal reflux disease)    Healthcare maintenance    CPX 12-24-09, Td 1/05, pneumovax 2000 age 75, GYN [name] ([place])   Hyperlipidemia    target less than 160 > try off muscle aches 03-18-10   Morbid obesity (HCC)    ideal = 142.  target wt = 158 for BMI < 30   Osteopenia    BMD 08-07-08 tspin - 1.2, left femur - .4, right femur +.2.  repeat  01-02-10  1.9    0.1   0.5   Rhinitis, chronic    Dr. Linward Foster...on immunotherapy since 5/08   Past Surgical History:  Procedure Laterality Date   BUNIONECTOMY  1982   bilateral   CHOLECYSTECTOMY N/A 07/17/2020   Procedure: LAPAROSCOPIC CHOLECYSTECTOMY;  Surgeon: Axel Filler, MD;  Location: [facility] OR;  Service: General;  Laterality: N/A;   FOOT SURGERY  1970   bilateral 5th toes, bones removed   GANGLION CYST EXCISION  2000   3rd finger right hand   TUBAL LIGATION  1971   Social History:   reports that she quit smoking about 67 years ago. Her  smoking use included cigarettes. She started smoking about 72 years ago. She has a 2.5 pack-year smoking history. She has never used smokeless tobacco. She reports that she does not drink alcohol and does not use drugs.  Family History  Problem Relation Age of Onset   Colon cancer Mother        (in computer for recall colonoscopy 10/12)   Healthy Daughter        x3   Healthy Son     Medications: Patient's Medications  New Prescriptions   No medications on file  Previous Medications   ACETAMINOPHEN (TYLENOL) 500 MG TABLET    Take 500 mg by mouth every 6 (six) hours as needed (pain).    CARBOXYMETHYLCELLUL-GLYCERIN (LUBRICATING EYE DROPS OP)    Place 1 drop into both eyes daily as needed (dry eyes).   CETIRIZINE (ZYRTEC) 10 MG TABLET    Take 10 mg by mouth at bedtime as needed (drippy nose, drainage, sneezing).    CHOLECALCIFEROL (VITAMIN D3) 25 MCG (1000 UT) CAPSULE    Take 1,000 Units by mouth daily.   CLOTRIMAZOLE-BETAMETHASONE (LOTRISONE) CREAM    Apply 1 application topically daily as needed for irritation.   CYANOCOBALAMIN (B-12 PO)    Take 1 tablet by mouth daily.   ENSURE (ENSURE)    Take 237 mLs by mouth 2 (two) times daily between meals.   FLUTICASONE (FLONASE) 50 MCG/ACT NASAL SPRAY    Place 1 spray into both nostrils as needed.   HYDROCORTISONE 2.5 % CREAM    Apply 1 application topically 2 (two) times daily as needed (itching).    PANTOPRAZOLE (PROTONIX) 40 MG TABLET    Take 1 tablet (40 mg total) by mouth daily.   ROSUVASTATIN (CRESTOR) 10 MG TABLET    TAKE 1 TABLET BY MOUTH EVERY DAY  Modified Medications   No medications on file  Discontinued Medications   No medications on file    Physical Exam:  Vitals:   03/18/23 1312  BP: 124/78  Pulse: 67  Temp: 97.8 F (36.6 C)  TempSrc: Temporal  SpO2: 96%  Weight: 144 lb (65.3 kg)  Height: 5\' 2"  (1.575 m)   Body mass index is 26.34 kg/m. Wt Readings from Last 3 Encounters:  03/18/23 144 lb (65.3 kg)  03/09/23  142 lb (64.4 kg)  12/06/22 145 lb (65.8 kg)    Physical Exam Constitutional:      General: She is not in acute distress.    Appearance: She is well-developed. She is not diaphoretic.  HENT:     Head: Normocephalic and atraumatic.     Mouth/Throat:     Pharynx: No oropharyngeal exudate.  Eyes:     Conjunctiva/sclera: Conjunctivae normal.     Pupils: Pupils are equal, round, and reactive to light.  Cardiovascular:     Rate and Rhythm: Normal  rate and regular rhythm.     Heart sounds: Normal heart sounds.  Pulmonary:     Effort: Pulmonary effort is normal.     Breath sounds: Normal breath sounds.  Abdominal:     General: Bowel sounds are normal.     Palpations: Abdomen is soft.  Musculoskeletal:     Cervical back: Normal range of motion and neck supple.     Right knee: Crepitus present. Decreased range of motion.     Right lower leg: No edema.     Left lower leg: No edema.  Skin:    General: Skin is warm and dry.  Neurological:     Mental Status: She is alert and oriented to person, place, and time.     Motor: Weakness present.     Gait: Gait abnormal.     Comments: Weak grips bilaterally  Psychiatric:        Mood and Affect: Mood normal.     Labs reviewed: Basic Metabolic Panel: Recent Labs    04/29/22 1412 08/20/22 1352 11/22/22 1334  NA 138 140 145  K 3.7 4.4 4.5  CL 104 105 110  CO2 27 25 25   GLUCOSE 87 82 88  BUN 18 19 18   CREATININE 0.84 0.96* 0.90  CALCIUM 9.7 9.8 9.7  TSH 1.34  --   --    Liver Function Tests: Recent Labs    04/29/22 1414 08/20/22 1352 11/22/22 1334  AST 16 14 14   ALT 13 10 11   ALKPHOS 63  --   --   BILITOT 0.9 0.9 0.6  PROT 7.3 6.8 6.7  ALBUMIN 4.4  --   --    No results for input(s): "LIPASE", "AMYLASE" in the last 8760 hours. No results for input(s): "AMMONIA" in the last 8760 hours. CBC: Recent Labs    04/29/22 1412 08/20/22 1352  WBC 4.9 4.1  NEUTROABS 2.5 2,317  HGB 12.3 12.6  HCT 37.7 36.4  MCV 98.2 96.3   PLT 234.0 228   Lipid Panel: Recent Labs    04/29/22 1412 08/20/22 1352 11/22/22 1334  CHOL 243* 280* 206*  HDL 80.90 77 90  LDLCALC 142* 185* 101*  TRIG 102.0 74 61  CHOLHDL 3 3.6 2.3   TSH: Recent Labs    04/29/22 1412  TSH 1.34   A1C: No results found for: "HGBA1C"   Assessment/Plan 1. Spondylolisthesis, cervical region -will get prednisone dose pack at this time to help with cervical radicular symptoms Encouraged follow up with neurosurgery if they do not improve or worsen. - predniSONE (STERAPRED UNI-PAK 21 TAB) 10 MG (21) TBPK tablet; Use as directed  Dispense: 21 tablet; Refill: 0  2. Cervical radiculopathy - predniSONE (STERAPRED UNI-PAK 21 TAB) 10 MG (21) TBPK tablet; Use as directed  Dispense: 21 tablet; Refill: 0  3. Dizziness Improved but suspect some of her dizziness due to not eating or drinking well with increase in caffeine use. She also has had allergy symptoms.  Increase water intake and decrease caffeine.  - TSH - CBC with Differential/Platelet - Complete Metabolic Panel with eGFR  4. Primary osteoarthritis of right knee -ongoing, PT should benefit as well as prednisone dose pack.  5. Debility Continues to increase protein in diet, 3 meals a day, PT scheduled   Jarmon Javid K. [facility]ag[facility] [phone]

## 2023-03-18 NOTE — Patient Instructions (Signed)
Start prednisone dose pack  Increase water intake.   Decrease caffeine   Eat 3 meals a day- ensure supplement with smallest meals.   Call neurosurgery if symptoms do not improve or worsen.

## 2023-03-19 LAB — COMPLETE METABOLIC PANEL WITH GFR
AG Ratio: 1.8 (calc) (ref 1.0–2.5)
ALT: 17 U/L (ref 6–29)
AST: 19 U/L (ref 10–35)
Albumin: 4.1 g/dL (ref 3.6–5.1)
Alkaline phosphatase (APISO): 64 U/L (ref 37–153)
BUN: 15 mg/dL (ref 7–25)
CO2: 27 mmol/L (ref 20–32)
Calcium: 9.6 mg/dL (ref 8.6–10.4)
Chloride: 106 mmol/L (ref 98–110)
Creat: 0.74 mg/dL (ref 0.60–0.95)
Globulin: 2.3 g/dL (calc) (ref 1.9–3.7)
Glucose, Bld: 80 mg/dL (ref 65–99)
Potassium: 4.1 mmol/L (ref 3.5–5.3)
Sodium: 141 mmol/L (ref 135–146)
Total Bilirubin: 0.8 mg/dL (ref 0.2–1.2)
Total Protein: 6.4 g/dL (ref 6.1–8.1)
eGFR: 77 mL/min/{1.73_m2} (ref >=60)

## 2023-03-19 LAB — CBC WITH DIFFERENTIAL/PLATELET
Absolute Monocytes: 300 cells/uL (ref 200–950)
Eosinophils Relative: 4.9 %
Hemoglobin: 11.8 g/dL (ref 11.7–15.5)
MPV: 10.6 fL (ref 7.5–12.5)
Monocytes Relative: 7.7 %
Neutro Abs: 1833 cells/uL (ref 1500–7800)
Neutrophils Relative %: 47 %
Platelets: 225 10*3/uL (ref 140–400)
RDW: 12.6 % (ref 11.0–15.0)
WBC: 3.9 10*3/uL (ref 3.8–10.8)

## 2023-03-19 LAB — TSH: TSH: 1.43 mIU/L (ref 0.40–4.50)

## 2023-03-24 ENCOUNTER — Inpatient Hospital Stay (HOSPITAL_COMMUNITY): Payer: Medicare Other

## 2023-03-24 ENCOUNTER — Other Ambulatory Visit: Payer: Self-pay

## 2023-03-24 ENCOUNTER — Telehealth: Payer: Self-pay | Admitting: *Deleted

## 2023-03-24 ENCOUNTER — Encounter (HOSPITAL_BASED_OUTPATIENT_CLINIC_OR_DEPARTMENT_OTHER): Payer: Self-pay | Admitting: Emergency Medicine

## 2023-03-24 ENCOUNTER — Emergency Department (HOSPITAL_BASED_OUTPATIENT_CLINIC_OR_DEPARTMENT_OTHER): Payer: Medicare Other

## 2023-03-24 ENCOUNTER — Inpatient Hospital Stay (HOSPITAL_BASED_OUTPATIENT_CLINIC_OR_DEPARTMENT_OTHER)
Admission: EM | Admit: 2023-03-24 | Discharge: 2023-03-28 | DRG: 065 | Disposition: A | Discharge status: Rehabilitation Facility | Payer: Medicare Other | Attending: Internal Medicine | Admitting: Internal Medicine

## 2023-03-24 DIAGNOSIS — R29702 NIHSS score 2: Secondary | ICD-10-CM | POA: Diagnosis present

## 2023-03-24 DIAGNOSIS — E7800 Pure hypercholesterolemia, unspecified: Secondary | ICD-10-CM | POA: Diagnosis present

## 2023-03-24 DIAGNOSIS — Z800 Family history of malignant neoplasm of digestive organs: Secondary | ICD-10-CM

## 2023-03-24 DIAGNOSIS — I63512 Cerebral infarction due to unspecified occlusion or stenosis of left middle cerebral artery: Secondary | ICD-10-CM | POA: Diagnosis present

## 2023-03-24 DIAGNOSIS — Z79899 Other long term (current) drug therapy: Secondary | ICD-10-CM

## 2023-03-24 DIAGNOSIS — I951 Orthostatic hypotension: Secondary | ICD-10-CM | POA: Diagnosis not present

## 2023-03-24 DIAGNOSIS — K5900 Constipation, unspecified: Secondary | ICD-10-CM | POA: Diagnosis not present

## 2023-03-24 DIAGNOSIS — E785 Hyperlipidemia, unspecified: Secondary | ICD-10-CM | POA: Diagnosis not present

## 2023-03-24 DIAGNOSIS — I69398 Other sequelae of cerebral infarction: Secondary | ICD-10-CM | POA: Diagnosis not present

## 2023-03-24 DIAGNOSIS — R2990 Unspecified symptoms and signs involving the nervous system: Secondary | ICD-10-CM | POA: Diagnosis not present

## 2023-03-24 DIAGNOSIS — K219 Gastro-esophageal reflux disease without esophagitis: Secondary | ICD-10-CM | POA: Diagnosis present

## 2023-03-24 DIAGNOSIS — I4891 Unspecified atrial fibrillation: Secondary | ICD-10-CM | POA: Diagnosis present

## 2023-03-24 DIAGNOSIS — I10 Essential (primary) hypertension: Secondary | ICD-10-CM | POA: Diagnosis not present

## 2023-03-24 DIAGNOSIS — I639 Cerebral infarction, unspecified: Secondary | ICD-10-CM | POA: Diagnosis not present

## 2023-03-24 DIAGNOSIS — I6930 Unspecified sequelae of cerebral infarction: Secondary | ICD-10-CM | POA: Diagnosis not present

## 2023-03-24 DIAGNOSIS — F32A Depression, unspecified: Secondary | ICD-10-CM | POA: Diagnosis not present

## 2023-03-24 DIAGNOSIS — M47812 Spondylosis without myelopathy or radiculopathy, cervical region: Secondary | ICD-10-CM | POA: Diagnosis present

## 2023-03-24 DIAGNOSIS — G8191 Hemiplegia, unspecified affecting right dominant side: Secondary | ICD-10-CM | POA: Diagnosis present

## 2023-03-24 DIAGNOSIS — R7989 Other specified abnormal findings of blood chemistry: Secondary | ICD-10-CM | POA: Diagnosis not present

## 2023-03-24 DIAGNOSIS — G8929 Other chronic pain: Secondary | ICD-10-CM | POA: Diagnosis present

## 2023-03-24 DIAGNOSIS — R638 Other symptoms and signs concerning food and fluid intake: Secondary | ICD-10-CM | POA: Diagnosis not present

## 2023-03-24 DIAGNOSIS — E119 Type 2 diabetes mellitus without complications: Secondary | ICD-10-CM | POA: Diagnosis present

## 2023-03-24 DIAGNOSIS — Z87891 Personal history of nicotine dependence: Secondary | ICD-10-CM

## 2023-03-24 DIAGNOSIS — J309 Allergic rhinitis, unspecified: Secondary | ICD-10-CM | POA: Diagnosis not present

## 2023-03-24 DIAGNOSIS — I63412 Cerebral infarction due to embolism of left middle cerebral artery: Secondary | ICD-10-CM | POA: Diagnosis not present

## 2023-03-24 DIAGNOSIS — R7303 Prediabetes: Secondary | ICD-10-CM | POA: Diagnosis not present

## 2023-03-24 DIAGNOSIS — M1711 Unilateral primary osteoarthritis, right knee: Secondary | ICD-10-CM | POA: Diagnosis not present

## 2023-03-24 DIAGNOSIS — I6389 Other cerebral infarction: Secondary | ICD-10-CM | POA: Diagnosis not present

## 2023-03-24 DIAGNOSIS — R531 Weakness: Secondary | ICD-10-CM | POA: Diagnosis not present

## 2023-03-24 LAB — URINALYSIS, ROUTINE W REFLEX MICROSCOPIC
Bilirubin Urine: NEGATIVE
Glucose, UA: NEGATIVE mg/dL
Hgb urine dipstick: NEGATIVE
Ketones, ur: NEGATIVE mg/dL
Leukocytes,Ua: NEGATIVE
Nitrite: NEGATIVE
Protein, ur: NEGATIVE mg/dL
Specific Gravity, Urine: 1.015 (ref 1.005–1.030)
pH: 7 (ref 5.0–8.0)

## 2023-03-24 LAB — CBC
HCT: 41.1 % (ref 36.0–46.0)
HCT: 39.1 % (ref 36.0–46.0)
Hemoglobin: 13.9 g/dL (ref 12.0–15.0)
Hemoglobin: 13.1 g/dL (ref 12.0–15.0)
MCH: 32.9 pg (ref 26.0–34.0)
MCH: 32.3 pg (ref 26.0–34.0)
MCHC: 33.8 g/dL (ref 30.0–36.0)
MCHC: 33.5 g/dL (ref 30.0–36.0)
MCV: 97.2 fL (ref 80.0–100.0)
MCV: 96.3 fL (ref 80.0–100.0)
Platelets: 216 10*3/uL (ref 150–400)
Platelets: 231 10*3/uL (ref 150–400)
RBC: 4.23 MIL/uL (ref 3.87–5.11)
RBC: 4.06 MIL/uL (ref 3.87–5.11)
RDW: 13.2 % (ref 11.5–15.5)
RDW: 13.2 % (ref 11.5–15.5)
WBC: 7.5 10*3/uL (ref 4.0–10.5)
WBC: 7 10*3/uL (ref 4.0–10.5)
nRBC: 0 % (ref 0.0–0.2)
nRBC: 0 % (ref 0.0–0.2)

## 2023-03-24 LAB — COMPREHENSIVE METABOLIC PANEL
ALT: 30 U/L (ref 0–44)
AST: 25 U/L (ref 15–41)
Albumin: 4 g/dL (ref 3.5–5.0)
Alkaline Phosphatase: 55 U/L (ref 38–126)
Anion gap: 9 (ref 5–15)
BUN: 22 mg/dL (ref 8–23)
CO2: 24 mmol/L (ref 22–32)
Calcium: 9.3 mg/dL (ref 8.9–10.3)
Chloride: 103 mmol/L (ref 98–111)
Creatinine, Ser: 0.88 mg/dL (ref 0.44–1.00)
GFR, Estimated: >60 mL/min (ref >60)
Glucose, Bld: 105 mg/dL — ABNORMAL HIGH (ref 70–99)
Potassium: 3.9 mmol/L (ref 3.5–5.1)
Sodium: 136 mmol/L (ref 135–145)
Total Bilirubin: 0.8 mg/dL (ref 0.3–1.2)
Total Protein: 7.3 g/dL (ref 6.5–8.1)

## 2023-03-24 LAB — DIFFERENTIAL
Abs Immature Granulocytes: 0.06 10*3/uL (ref 0.00–0.07)
Basophils Absolute: 0 10*3/uL (ref 0.0–0.1)
Basophils Relative: 0 %
Eosinophils Absolute: 0 10*3/uL (ref 0.0–0.5)
Eosinophils Relative: 0 %
Immature Granulocytes: 1 %
Lymphocytes Relative: 24 %
Lymphs Abs: 1.8 10*3/uL (ref 0.7–4.0)
Monocytes Absolute: 0.5 10*3/uL (ref 0.1–1.0)
Monocytes Relative: 7 %
Neutro Abs: 5.1 10*3/uL (ref 1.7–7.7)
Neutrophils Relative %: 68 %

## 2023-03-24 LAB — CREATININE, SERUM
Creatinine, Ser: 0.87 mg/dL (ref 0.44–1.00)
GFR, Estimated: >60 mL/min (ref >60)

## 2023-03-24 LAB — RAPID URINE DRUG SCREEN, HOSP PERFORMED
Amphetamines: NOT DETECTED
Barbiturates: NOT DETECTED
Benzodiazepines: NOT DETECTED
Cocaine: NOT DETECTED
Opiates: NOT DETECTED
Tetrahydrocannabinol: NOT DETECTED

## 2023-03-24 LAB — PROTIME-INR
INR: 1.1 (ref 0.8–1.2)
Prothrombin Time: 14.1 seconds (ref 11.4–15.2)

## 2023-03-24 LAB — ETHANOL: Alcohol, Ethyl (B): <10 mg/dL (ref <10)

## 2023-03-24 LAB — HEMOGLOBIN A1C
Hgb A1c MFr Bld: 6 % — ABNORMAL HIGH (ref 4.8–5.6)
Mean Plasma Glucose: 125.5 mg/dL

## 2023-03-24 LAB — APTT: aPTT: 26 seconds (ref 24–36)

## 2023-03-24 MED ORDER — PANTOPRAZOLE SODIUM 40 MG PO TBEC
40.0000 mg | DELAYED_RELEASE_TABLET | Freq: Every day | ORAL | Status: DC
  Administered 2023-03-24 – 2023-03-28 (×5): 40 mg via ORAL
  Filled 2023-03-24 (×5): qty 1

## 2023-03-24 MED ORDER — POLYETHYL GLYCOL-PROPYL GLYCOL 0.4-0.3 % OP SOLN: Freq: Every day | OPHTHALMIC | Status: DC | PRN

## 2023-03-24 MED ORDER — SODIUM CHLORIDE 0.9 % IV SOLN: INTRAVENOUS | Status: DC

## 2023-03-24 MED ORDER — ENSURE ENLIVE PO LIQD
237.0000 mL | Freq: Two times a day (BID) | ORAL | Status: DC
  Administered 2023-03-25 – 2023-03-28 (×8): 237 mL via ORAL

## 2023-03-24 MED ORDER — STROKE: EARLY STAGES OF RECOVERY BOOK
Freq: Once | Status: AC
  Filled 2023-03-24: qty 1

## 2023-03-24 MED ORDER — ASPIRIN 81 MG PO CHEW
81.0000 mg | CHEWABLE_TABLET | Freq: Every day | ORAL | Status: DC
  Administered 2023-03-24 – 2023-03-28 (×5): 81 mg via ORAL
  Filled 2023-03-24 (×5): qty 1

## 2023-03-24 MED ORDER — ACETAMINOPHEN 650 MG RE SUPP: 650.0000 mg | RECTAL | Status: DC | PRN

## 2023-03-24 MED ORDER — ACETAMINOPHEN 325 MG PO TABS: 650.0000 mg | ORAL_TABLET | ORAL | Status: DC | PRN

## 2023-03-24 MED ORDER — CLOPIDOGREL BISULFATE 75 MG PO TABS
75.0000 mg | ORAL_TABLET | Freq: Every day | ORAL | Status: DC
  Administered 2023-03-24 – 2023-03-28 (×5): 75 mg via ORAL
  Filled 2023-03-24 (×5): qty 1

## 2023-03-24 MED ORDER — SENNOSIDES-DOCUSATE SODIUM 8.6-50 MG PO TABS: 1.0000 | ORAL_TABLET | Freq: Every evening | ORAL | Status: DC | PRN

## 2023-03-24 MED ORDER — HEPARIN SODIUM (PORCINE) 5000 UNIT/ML IJ SOLN
5000.0000 [IU] | Freq: Three times a day (TID) | INTRAMUSCULAR | Status: DC
  Administered 2023-03-24 – 2023-03-28 (×12): 5000 [IU] via SUBCUTANEOUS
  Filled 2023-03-24 (×12): qty 1

## 2023-03-24 MED ORDER — VITAMIN D 25 MCG (1000 UNIT) PO TABS
1000.0000 [IU] | ORAL_TABLET | Freq: Every day | ORAL | Status: DC
  Administered 2023-03-25 – 2023-03-28 (×4): 1000 [IU] via ORAL
  Filled 2023-03-24 (×4): qty 1

## 2023-03-24 MED ORDER — ROSUVASTATIN CALCIUM 5 MG PO TABS
10.0000 mg | ORAL_TABLET | Freq: Every day | ORAL | Status: DC
  Administered 2023-03-24 – 2023-03-25 (×2): 10 mg via ORAL
  Filled 2023-03-24 (×2): qty 2

## 2023-03-24 MED ORDER — POLYVINYL ALCOHOL 1.4 % OP SOLN: 1.0000 [drp] | OPHTHALMIC | Status: DC | PRN

## 2023-03-24 MED ORDER — ACETAMINOPHEN 160 MG/5ML PO SOLN: 650.0000 mg | ORAL | Status: DC | PRN

## 2023-03-24 MED ORDER — SODIUM CHLORIDE 0.9% FLUSH
3.0000 mL | Freq: Once | INTRAVENOUS | Status: DC
  Filled 2023-03-24: qty 3

## 2023-03-24 MED ORDER — ENSURE PO LIQD: 237.0000 mL | Freq: Two times a day (BID) | ORAL | Status: DC

## 2023-03-24 MED ORDER — ACETAMINOPHEN 500 MG PO TABS: 500.0000 mg | ORAL_TABLET | Freq: Four times a day (QID) | ORAL | Status: DC | PRN

## 2023-03-24 MED ORDER — LORATADINE 10 MG PO TABS: 10.0000 mg | ORAL_TABLET | Freq: Every day | ORAL | Status: DC | PRN

## 2023-03-24 NOTE — ED Provider Notes (Signed)
[place] [facility] Provider Note   CSN: [id number] Arrival date & time: 03/24/23  1134     History  Chief Complaint  Patient presents with   Extremity Weakness    [name] is a 89 y.o. female.  Patient here with worsening right upper arm weakness.  Sounds like some of her symptoms are started few weeks ago.  She is noting some right arm weakness maybe 2 weeks ago and now worse over the last 2 hours.  Has been dizzy here the last few days as well.  Denies any vision loss or speech changes.  Does not notice any obvious numbness.  Denies any chest pain or shortness of breath.  Not on blood thinners.  No stroke history.  History of high cholesterol.  She thought she was dealing with some arthritis in the right arm which is causing some of her weakness but she is not having any pain associated with her weakness today    The history is provided by the patient.       Home Medications Prior to Admission medications   Medication Sig Start Date End Date Taking? Authorizing Provider  acetaminophen (TYLENOL) 500 MG tablet Take 500 mg by mouth every 6 (six) hours as needed (pain).     [provider]  Carboxymethylcellul-Glycerin (LUBRICATING EYE DROPS OP) Place 1 drop into both eyes daily as needed (dry eyes). Patient not taking: Reported on 03/18/2023    [provider]  cetirizine (ZYRTEC) 10 MG tablet Take 10 mg by mouth at bedtime as needed (drippy nose, drainage, sneezing).     [provider]  Cholecalciferol (VITAMIN D3) 25 MCG (1000 UT) capsule Take 1,000 Units by mouth daily.    [provider]  clotrimazole-betamethasone (LOTRISONE) cream Apply 1 application topically daily as needed for irritation. 06/23/20   [provider]  Cyanocobalamin (B-12 PO) Take 1 tablet by mouth daily.    [provider]  Ensure (ENSURE) Take 237 mLs by mouth 2 (two) times daily between meals.    [provider]  fluticasone (FLONASE) 50 MCG/ACT nasal spray Place 1 spray into both nostrils as needed. 07/13/16   [provider]  hydrocortisone 2.5 % cream Apply 1 application topically 2 (two) times daily as needed (itching).     [provider]  pantoprazole (PROTONIX) 40 MG tablet Take 1 tablet (40 mg total) by mouth daily. 09/22/22   Zehr, Princella Pellegrini, PA-C  predniSONE (STERAPRED UNI-PAK 21 TAB) 10 MG (21) TBPK tablet Use as directed 03/18/23   Sharon Seller, NP  rosuvastatin (CRESTOR) 10 MG tablet TAKE 1 TABLET BY MOUTH EVERY DAY 03/07/23   Sharon Seller, NP      Allergies    Patient has no known allergies.    Review of Systems   Review of Systems  Physical Exam Updated Vital Signs BP (!) 151/81 (BP Location: Right Arm)   Pulse 74   Temp 98.3 F (36.8 C) (Temporal)   Resp 18   SpO2 100%  Physical Exam Vitals and nursing note reviewed.  Constitutional:      General: She is not in acute distress.    Appearance: She is well-developed. She is not ill-appearing.  HENT:     Head: Normocephalic and atraumatic.     Nose: Nose normal.     Mouth/Throat:     Mouth: Mucous membranes are moist.  Eyes:     Extraocular Movements: Extraocular movements intact.  Conjunctiva/sclera: Conjunctivae normal.     Pupils: Pupils are equal, round, and reactive to light.  Cardiovascular:     Rate and Rhythm: Normal rate and regular rhythm.     Pulses: Normal pulses.     Heart sounds: Normal heart sounds. No murmur heard. Pulmonary:     Effort: Pulmonary effort is normal. No respiratory distress.     Breath sounds: Normal breath sounds.  Abdominal:     Palpations: Abdomen is soft.     Tenderness: There is no abdominal tenderness.  Musculoskeletal:        General: No swelling.     Cervical back: Normal range of motion and neck supple.  Skin:    General: Skin is warm and dry.     Capillary Refill: Capillary refill takes less than 2 seconds.  Neurological:      Mental Status: She is alert.     Comments: Numbness to the right face, right arm, right leg but otherwise normal sensation, poor strength in the right upper extremity probably 2+ out of 5 otherwise 5+ out of 5 strength throughout no obvious aphasia or dysarthria, no visual field changes  Psychiatric:        Mood and Affect: Mood normal.     ED Results / Procedures / Treatments   Labs (all labs ordered are listed, but only abnormal results are displayed) Labs Reviewed  COMPREHENSIVE METABOLIC PANEL - Abnormal; Notable for the following components:      Result Value   Glucose, Bld 105 (*)    All other components within normal limits  PROTIME-INR  APTT  CBC  DIFFERENTIAL  ETHANOL  RAPID URINE DRUG SCREEN, HOSP PERFORMED  URINALYSIS, ROUTINE W REFLEX MICROSCOPIC  CBG MONITORING, ED    EKG None  Radiology CT HEAD CODE STROKE WO CONTRAST  Result Date: 03/24/2023 CLINICAL DATA:  Code stroke. Neuro deficit, acute, stroke suspected. Right hand and arm weakness/numbness. EXAM: CT HEAD WITHOUT CONTRAST TECHNIQUE: Contiguous axial images were obtained from the base of the skull through the vertex without intravenous contrast. RADIATION DOSE REDUCTION: This exam was performed according to the departmental dose-optimization program which includes automated exposure control, adjustment of the mA and/or kV according to patient size and/or use of iterative reconstruction technique. COMPARISON:  None. FINDINGS: Brain: No age advanced or lobar predominant parenchymal atrophy. There is a small focus hypodensity within the left frontal lobe centrum semiovale (for instance as seen on series 5, image 33). There is no acute intracranial hemorrhage. No demarcated cortical infarct. No extra-axial fluid collection. No evidence of an intracranial mass. No midline shift. Vascular: No hyperdense vessel.  Atherosclerotic calcifications. Skull: No calvarial fracture or aggressive osseous lesion. Sinuses/Orbits: No  mass or acute finding within the imaged orbits. No significant paranasal sinus disease. ASPECTS (Alberta Stroke Program Early CT Score) - Ganglionic level infarction (caudate, lentiform nuclei, internal capsule, insula, M1-M3 cortex): 7 - Supraganglionic infarction (M4-M6 cortex): 3 Total score (0-10 with 10 being normal): 10 Impression #1 called by telephone at the time of interpretation on 03/24/2023 at 12:38 pm to provider Amya Hlad , who verbally acknowledged these results. IMPRESSION: 1. Small focus of hypodensity within the left frontal lobe white matter, which may reflect small vessel ischemic disease or an acute white matter infarct. Consider a brain MRI for further evaluation. 2. No acute intracranial hemorrhage or acute demarcated cortical infarct. Electronically Signed   By: Jackey Loge D.O.   On: 03/24/2023 12:39    Procedures Procedures  Medications Ordered in ED Medications  sodium chloride flush (NS) 0.9 % injection 3 mL (3 mLs Intravenous Not Given 03/24/23 1245)    ED Course/ Medical Decision Making/ A&P                             Medical Decision Making Amount and/or Complexity of Data Reviewed Labs: ordered. Radiology: ordered.  Risk Decision regarding hospitalization.   [name] is here with right arm weakness.  History of high cholesterol.  She has been dealing with some right-sided arm weakness maybe last 2 weeks or so.  Overall, hard to know last known normal.  She thinks its gotten worse in the last 2 hours.  She is not having any pain associated with this arm weakness.  She appears to be now more in the right side of her face arm and leg as well but cannot really tell me when that started.  She is pretty weak in the right upper extremity probably 2+ out of 5 if not worse.  She does not have any other weakness or speech difficulties but does seem a little bit slow getting words out.  Code stroke was initiated in triage conservatively.  Talked with Dr.  Selina Cooley w/ neur who overall thinks that we cannot really offer TNK which I think is reasonable as we do not really know last known normal but does sound like things may be progressed.  The last few hours.  CT scan of the head per radiology on the phone shows may be hypodensity in the left frontal lobe which could be an acute white matter infarct.  This could correlate with her symptoms.  Ultimately we have no concern for LVO at this time and we will just pursue MRI brain will also get MRI of the cervical spine to make sure there is not cervical problem causing the weakness.  Lab work per my review interpretation is unremarkable.  Neurology to follow along.  Patient to be admitted to medicine.  This chart was dictated using voice recognition software.  Despite best efforts to proofread,  errors can occur which can change the documentation meaning.         Final Clinical Impression(s) / ED Diagnoses Final diagnoses:  Stroke-like symptoms    Rx / DC Orders ED Discharge Orders     None         Virgina Norfolk, DO 03/24/23 1323

## 2023-03-24 NOTE — Consult Note (Signed)
NEUROLOGY TELECONSULTATION NOTE   Date of service: March 24, 2023 Patient Name: [name] MRN:  [id number] DOB:  08-02-1931 Reason for consult: telestroke  Requesting Provider: Virgina Norfolk Consult Participants: myself, patient, bedside RN, telestroke RN Location of the provider: [facility] Location of the patient: [facility]  This consult was provided via telemedicine with 2-way video and audio communication. The patient/family was informed that care would be provided in this way and agreed to receive care in this manner.   _ _ _   _ __   _ __ _ _  __ __   _ __   __ _  History of Present Illness   This is a 89 year old woman with a past medical history significant for hyperlipidemia and osteopenia who presents with right arm weakness for 1 week with acute worsening of the symptoms today and new symptoms of right-sided sensory deficits.  LKW 1 week ago. Patient reports that she has had progressive right upper extremity weakness over the past week to the point where she is unable to lift her hand at the wrist and has a very weak grip.  She does have some chronic pain in her shoulder but feels that she is also weak.  On Tuesday family reported that she had a transient episode of word finding difficulty that did resolve on its own.  Today in addition to the right arm weakness that she developed acute onset of tingling and numbness in her right arm and leg therefore she sought care in the ED.  NIH stroke scale was 2.  CT head showed no acute process on personal review.  TNK was not administered due to some of the symptoms starting more than 4.5 hours ago.  CTA was not performed as part of the stroke code because exam was not consistent with LVO.   ROS   Per HPI; all other systems reviewed and are negative  Past History   The following was personally reviewed:  Past Medical History:  Diagnosis Date   Arthritis    Arthropathy, unspecified, site unspecified    Dr. Noel Gerold...right clavicular head  swelling 2003   Colon polyp    colonoscopy 06-24-06 with no adenomatous change, hyperplasia  only   Environmental allergies    GERD (gastroesophageal reflux disease)    Healthcare maintenance    CPX 12-24-09, Td 1/05, pneumovax 2000 age 59, [name] ([place])   Hyperlipidemia    target less than 160 > try off muscle aches 03-18-10   Morbid obesity (HCC)    ideal = 142.  target wt = 158 for BMI < 30   Osteopenia    BMD 08-07-08 tspin - 1.2, left femur - .4, right femur +.2.  repeat 01-02-10  1.9    0.1   0.5   Rhinitis, chronic    Dr. Linward Foster...on immunotherapy since 5/08   Past Surgical History:  Procedure Laterality Date   BUNIONECTOMY  1982   bilateral   CHOLECYSTECTOMY N/A 07/17/2020   Procedure: LAPAROSCOPIC CHOLECYSTECTOMY;  Surgeon: Axel Filler, MD;  Location: [facility] OR;  Service: General;  Laterality: N/A;   FOOT SURGERY  1970   bilateral 5th toes, bones removed   GANGLION CYST EXCISION  2000   3rd finger right hand   TUBAL LIGATION  1971   Family History  Problem Relation Age of Onset   Colon cancer Mother        (in computer for recall colonoscopy 10/12)   Healthy Daughter  x3   Healthy Son    Social History   Socioeconomic History   Marital status: Widowed    Spouse name: Not on file   Number of children: 4   Years of education: Not on file   Highest education level: Some college, no degree  Occupational History   Not on file  Tobacco Use   Smoking status: Former    Current packs/day: 0.00    Average packs/day: 0.5 packs/day for 5.0 years (2.5 ttl pk-yrs)    Types: Cigarettes    Start date: 09/06/1950    Quit date: 09/07/1955    Years since quitting: 67.5   Smokeless tobacco: Never  Vaping Use   Vaping status: Never Used  Substance and Sexual Activity   Alcohol use: No    Alcohol/week: 0.0 standard drinks of alcohol   Drug use: No   Sexual activity: Not Currently  Other Topics Concern   Not on file  Social History Narrative    Exercise - 2 times weekly   Caffeine - 2 cups daily   Negative history of passive tobacco smoke exposure   Lives with husband of 59 years in one story home.   Has 4 children.   Retired from [employer].    Social Determinants of Health   Financial Resource Strain: Low Risk  (03/07/2023)   Overall Financial Resource Strain (CARDIA)    Difficulty of Paying Living Expenses: Not hard at all  Food Insecurity: No Food Insecurity (03/07/2023)   Hunger Vital Sign    Worried About Running Out of Food in the Last Year: Never true    Ran Out of Food in the Last Year: Never true  Transportation Needs: No Transportation Needs (03/07/2023)   PRAPARE - Administrator, Civil Service (Medical): No    Lack of Transportation (Non-Medical): No  Physical Activity: Unknown (03/07/2023)   Exercise Vital Sign    Days of Exercise per Week: 0 days    Minutes of Exercise per Session: Not on file  Stress: Stress Concern Present (03/07/2023)   [employer] of Occupational Health - Occupational Stress Questionnaire    Feeling of Stress : Very much  Social Connections: Moderately Integrated (03/07/2023)   Social Connection and Isolation Panel [NHANES]    Frequency of Communication with Friends and Family: More than three times a week    Frequency of Social Gatherings with Friends and Family: More than three times a week    Attends Religious Services: More than 4 times per year    Active Member of [employer] or Organizations: Yes    Attends Banker Meetings: More than 4 times per year    Marital Status: Widowed   No Known Allergies  Medications   (Not in a hospital admission)     Current Facility-Administered Medications:    sodium chloride flush (NS) 0.9 % injection 3 mL, 3 mL, Intravenous, Once, Curatolo, Adam, DO  Current Outpatient Medications:    acetaminophen (TYLENOL) 500 MG tablet, Take 500 mg by mouth every 6 (six) hours as needed (pain). , Disp: , Rfl:     Carboxymethylcellul-Glycerin (LUBRICATING EYE DROPS OP), Place 1 drop into both eyes daily as needed (dry eyes). (Patient not taking: Reported on 03/18/2023), Disp: , Rfl:    cetirizine (ZYRTEC) 10 MG tablet, Take 10 mg by mouth at bedtime as needed (drippy nose, drainage, sneezing). , Disp: , Rfl:    Cholecalciferol (VITAMIN D3) 25 MCG (1000 UT) capsule, Take 1,000 Units by mouth  daily., Disp: , Rfl:    clotrimazole-betamethasone (LOTRISONE) cream, Apply 1 application topically daily as needed for irritation., Disp: , Rfl:    Cyanocobalamin (B-12 PO), Take 1 tablet by mouth daily., Disp: , Rfl:    Ensure (ENSURE), Take 237 mLs by mouth 2 (two) times daily between meals., Disp: , Rfl:    fluticasone (FLONASE) 50 MCG/ACT nasal spray, Place 1 spray into both nostrils as needed., Disp: , Rfl:    hydrocortisone 2.5 % cream, Apply 1 application topically 2 (two) times daily as needed (itching). , Disp: , Rfl:    pantoprazole (PROTONIX) 40 MG tablet, Take 1 tablet (40 mg total) by mouth daily., Disp: 30 tablet, Rfl: 5   predniSONE (STERAPRED UNI-PAK 21 TAB) 10 MG (21) TBPK tablet, Use as directed, Disp: 21 tablet, Rfl: 0   rosuvastatin (CRESTOR) 10 MG tablet, TAKE 1 TABLET BY MOUTH EVERY DAY, Disp: 90 tablet, Rfl: 0  Vitals   Vitals:   03/24/23 1151  BP: (!) 151/81  Pulse: 74  Resp: 18  Temp: 98.3 F (36.8 C)  TempSrc: Temporal  SpO2: 100%     There is no height or weight on file to calculate BMI.  Physical Exam   Exam performed over telemedicine with 2-way video and audio communication and with assistance of bedside RN  Physical Exam Gen: A&O x4, NAD Resp: normal WOB CV: extremities appear well-perfused  Neuro: *MS: A&O x4. Follows multi-step commands.  *Speech: nondysarthric, no aphasia, able to name and repeat *CN: PERRL 3mm, EOMI, VFF by confrontation, sensory impairment to R face, smile symmetric, hearing intact to voice *Motor:   Normal bulk.  No tremor, rigidity or  bradykinesia. RUE drift but not to bed. R grip strength is weak and she has R wrist drop. No drift in any other extremity. *Sensory: Sensory impairment to LT in RLE. No double-simultaneous extinction.  *Coordination:  Finger-to-nose, heel-to-shin, rapid alternating motions were intact. *Reflexes:  UTA 2/2 tele-exam *Gait: deferred   NIHSS  1a Level of Conscious.: 0 1b LOC Questions: 0 1c LOC Commands: 0 2 Best Gaze: 0 3 Visual: 0 4 Facial Palsy: 0 5a Motor Arm - left: 0 5b Motor Arm - Right: 1 6a Motor Leg - Left: 0 6b Motor Leg - Right: 0 7 Limb Ataxia: 0 8 Sensory: 1 9 Best Language: 0 10 Dysarthria: 0 11 Extinct. and Inatten.: 0  TOTAL: 2   Premorbid mRS = 1   Labs   CBC:  Recent Labs  Lab 03/18/23 1354  WBC 3.9  NEUTROABS 1,833  HGB 11.8  HCT 35.2  MCV 96.7  PLT 225    Basic Metabolic Panel:  Lab Results  Component Value Date   NA 141 03/18/2023   K 4.1 03/18/2023   CO2 27 03/18/2023   GLUCOSE 80 03/18/2023   BUN 15 03/18/2023   CREATININE 0.74 03/18/2023   CALCIUM 9.6 03/18/2023   GFRNONAA 46 (L) 04/30/2020   GFRAA 53 (L) 04/30/2020   Lipid Panel:  Lab Results  Component Value Date   LDLCALC 101 (H) 11/22/2022   HgbA1c: No results found for: "HGBA1C" Urine Drug Screen: No results found for: "LABOPIA", "COCAINSCRNUR", "LABBENZ", "AMPHETMU", "THCU", "LABBARB"  Alcohol Level No results found for: "ETH"  Head CT no acute process on personal review  Impression   This is a 89 year old woman with a past medical history significant for hyperlipidemia and osteopenia who presents with right arm weakness for 1 week with acute worsening of the symptoms today, new  onset R face and leg numbness 2 hours ago, and transient episode of aphasia that resolved on Tuesday. R sided numbness and weakness in conjunction with transient aphasia all localize to ischemic event in the L MCA distribution. The RUE weakness has some atypical features, progressive over past  weak, associated with shoulder pain) therefore MRI c spine warranted in addition to stroke workup.  Recommendations   - Admit to [facility] for stroke workup - Permissive HTN x48 hrs from onset of most recent sx (numbness today at 1400) or until stroke ruled out by MRI goal BP <220/110. PRN labetalol or hydralazine if BP above these parameters. Avoid oral antihypertensives. - MRI brain wo contrast - MRI c spine per above - CTA or MRA H&N - TTE - Check A1c and LDL + add statin per guidelines - ASA 81mg  daily + plavix 75mg  daily x21 days f/b ASA 81mg  daily monotherapy after that - q4 hr neuro checks - STAT head CT for any change in neuro exam - Tele - PT/OT/SLP - Stroke education - Amb referral to neurology upon discharge   Please notify [facility] neurohospitalist on arrival so that stroke team there may follow her.   ______________________________________________________________________   Thank you for the opportunity to take part in the care of this patient. If you have any further questions, please contact the neurology consultation attending.  Signed,  Bing Neighbors, MD [facility] [phone]  If 7pm- 7am, please page neurology on call as listed in AMION.  **Any copied and pasted documentation in this note was written by me in another application not billed for and pasted by me into this document.

## 2023-03-24 NOTE — ED Notes (Signed)
Pt transported to CT as code stroke, teleneuro activated. States she has had weakness in right arm x 1 week but has progressively been worsening, and even worse the past 2 hours. Delay in calling Neurologst as teleneuro RN wanting to check with provider regarding code stroke status as patient is out of window for TNK.   Continuing code stroke per Dr. Lockie Mola.

## 2023-03-24 NOTE — ED Notes (Signed)
Called [facility] for transport @2 :58pm.  Spoke with [employer]

## 2023-03-24 NOTE — ED Notes (Signed)
Neurologist on video at bedside performing NIH. Pt has decreased sensation to right side of body as well as right arm weakness, however no further deficits. Dr. Selina Cooley canceling CT angio.

## 2023-03-24 NOTE — Progress Notes (Signed)
Patient arrived to [facility] via [facility]. VSS. Notified [facility] team and stroke team of pt's arrival

## 2023-03-24 NOTE — ED Notes (Signed)
Report given to [facility]  

## 2023-03-24 NOTE — Telephone Encounter (Signed)
Noted  

## 2023-03-24 NOTE — Progress Notes (Signed)
Patient left unit for MRI at this time.

## 2023-03-24 NOTE — ED Notes (Signed)
ED TO INPATIENT HANDOFF REPORT  ED Nurse Name and Phone #: Delice Bison, RN  S Name/Age/Gender [name] 89 y.o. female Room/Bed: MH01/MH01  Code Status   Code Status: Prior  Home/SNF/Other Home Patient oriented to: self, place, time, and situation Is this baseline? Yes   Triage Complete: Triage complete  Chief Complaint Acute CVA (cerebrovascular accident) [facility]) [I63.9]  Triage Note Pt presents to ED POV w/ son. Pt c/o R arm weakness that worsened 2 months ago but suddenly worsened 1h ago per son. Per son pt has an episode yesterday ~98mins where she couldn't find her words.   Pt also reports decreased sensation on R side. Since 1000 this morning.  Dr. Lockie Mola assessing in triage   Allergies No Known Allergies  Level of Care/Admitting Diagnosis ED Disposition     ED Disposition  Admit   Condition  --   Comment  Hospital Area: [facility] [[id number]]  Level of Care: Progressive [102]  Admit to Progressive based on following criteria: NEUROLOGICAL AND NEUROSURGICAL complex patients with significant risk of instability, who do not meet ICU criteria, yet require close observation or frequent assessment (< / = every 2 - 4 hours) with medical / nursing intervention.  May admit patient to [facility] or [facility] if equivalent level of care is available:: No  Interfacility transfer: Yes  Covid Evaluation: Asymptomatic - no recent exposure (last 10 days) testing not required  Diagnosis: Acute CVA (cerebrovascular accident) [facility]) [[id number]]  Admitting Physician: Lurline Del [[id number]]  Attending Physician: Lurline Del [[id number]]  Certification:: I certify this patient will need inpatient services for at least 2 midnights  Estimated Length of Stay: 3          B Medical/Surgery History Past Medical History:  Diagnosis Date   Arthritis    Arthropathy, unspecified, site unspecified    Dr. Noel Gerold...right clavicular head swelling 2003    Colon polyp    colonoscopy 06-24-06 with no adenomatous change, hyperplasia  only   Environmental allergies    GERD (gastroesophageal reflux disease)    Healthcare maintenance    CPX 12-24-09, Td 1/05, pneumovax 2000 age 40, [name] ([place])   Hyperlipidemia    target less than 160 > try off muscle aches 03-18-10   Morbid obesity (HCC)    ideal = 142.  target wt = 158 for BMI < 30   Osteopenia    BMD 08-07-08 tspin - 1.2, left femur - .4, right femur +.2.  repeat 01-02-10  1.9    0.1   0.5   Rhinitis, chronic    Dr. Linward Foster...on immunotherapy since 5/08   Past Surgical History:  Procedure Laterality Date   BUNIONECTOMY  1982   bilateral   CHOLECYSTECTOMY N/A 07/17/2020   Procedure: LAPAROSCOPIC CHOLECYSTECTOMY;  Surgeon: Axel Filler, MD;  Location: [facility] OR;  Service: General;  Laterality: N/A;   FOOT SURGERY  1970   bilateral 5th toes, bones removed   GANGLION CYST EXCISION  2000   3rd finger right hand   TUBAL LIGATION  1971     A IV Location/Drains/Wounds Patient Lines/Drains/Airways Status     Active Line/Drains/Airways     Name Placement date Placement time Site Days   Peripheral IV 03/24/23 20 G 1" Anterior;Right Forearm 03/24/23  1245  Forearm  less than 1   Incision (Closed) 07/17/20 Abdomen Other (Comment) 07/17/20  1147  -- 980   Incision - 4 Ports Abdomen Upper;Umbilicus Left;Lateral;Lower Left;Medial;Upper  07/17/20  1131  -- 980            Intake/Output Last 24 hours No intake or output data in the 24 hours ending 03/24/23 1357  Labs/Imaging Results for orders placed or performed during the hospital encounter of 03/24/23 (from the past 48 hour(s))  Urinalysis, Routine w reflex microscopic -Urine, Clean Catch     Status: None   Collection Time: 03/24/23 12:02 PM  Result Value Ref Range   Color, Urine YELLOW YELLOW   APPearance CLEAR CLEAR   Specific Gravity, Urine 1.015 1.005 - 1.030   pH 7.0 5.0 - 8.0   Glucose, UA NEGATIVE NEGATIVE  mg/dL   Hgb urine dipstick NEGATIVE NEGATIVE   Bilirubin Urine NEGATIVE NEGATIVE   Ketones, ur NEGATIVE NEGATIVE mg/dL   Protein, ur NEGATIVE NEGATIVE mg/dL   Nitrite NEGATIVE NEGATIVE   Leukocytes,Ua NEGATIVE NEGATIVE    Comment: Microscopic not done on urines with negative protein, blood, leukocytes, nitrite, or glucose < 500 mg/dL. Performed at [facility], [address] [place], [place] [address]   Protime-INR     Status: None   Collection Time: 03/24/23 12:40 PM  Result Value Ref Range   Prothrombin Time 14.1 11.4 - 15.2 seconds   INR 1.1 0.8 - 1.2    Comment: (NOTE) INR goal varies based on device and disease states. Performed at [facility], [address] [place], [place] [address]   APTT     Status: None   Collection Time: 03/24/23 12:40 PM  Result Value Ref Range   aPTT 26 24 - 36 seconds    Comment: Performed at [facility], [address] [place], [place] [address]  CBC     Status: None   Collection Time: 03/24/23 12:40 PM  Result Value Ref Range   WBC 7.5 4.0 - 10.5 K/uL   RBC 4.23 3.87 - 5.11 MIL/uL   Hemoglobin 13.9 12.0 - 15.0 g/dL   HCT 21.3 08.6 - 57.8 %   MCV 97.2 80.0 - 100.0 fL   MCH 32.9 26.0 - 34.0 pg   MCHC 33.8 30.0 - 36.0 g/dL   RDW 46.9 62.9 - 52.8 %   Platelets 216 150 - 400 K/uL   nRBC 0.0 0.0 - 0.2 %    Comment: Performed at [facility], [address] [place], [place] [address]  Differential     Status: None   Collection Time: 03/24/23 12:40 PM  Result Value Ref Range   Neutrophils Relative % 68 %   Neutro Abs 5.1 1.7 - 7.7 K/uL   Lymphocytes Relative 24 %   Lymphs Abs 1.8 0.7 - 4.0 K/uL   Monocytes Relative 7 %   Monocytes Absolute 0.5 0.1 - 1.0 K/uL   Eosinophils Relative 0 %   Eosinophils Absolute 0.0 0.0 - 0.5 K/uL   Basophils Relative 0 %   Basophils Absolute 0.0 0.0 - 0.1 K/uL   Immature Granulocytes 1 %   Abs Immature Granulocytes 0.06 0.00 - 0.07 K/uL    Comment: Performed  at [facility], [address] [place], [place] [address]  Comprehensive metabolic panel     Status: Abnormal   Collection Time: 03/24/23 12:40 PM  Result Value Ref Range   Sodium 136 135 - 145 mmol/L   Potassium 3.9 3.5 - 5.1 mmol/L   Chloride 103 98 - 111 mmol/L   CO2 24 22 - 32 mmol/L  Glucose, Bld 105 (H) 70 - 99 mg/dL    Comment: Glucose reference range applies only to samples taken after fasting for at least 8 hours.   BUN 22 8 - 23 mg/dL   Creatinine, Ser 1.61 0.44 - 1.00 mg/dL   Calcium 9.3 8.9 - 09.6 mg/dL   Total Protein 7.3 6.5 - 8.1 g/dL   Albumin 4.0 3.5 - 5.0 g/dL   AST 25 15 - 41 U/L   ALT 30 0 - 44 U/L   Alkaline Phosphatase 55 38 - 126 U/L   Total Bilirubin 0.8 0.3 - 1.2 mg/dL   GFR, Estimated >04 >54 mL/min    Comment: (NOTE) Calculated using the CKD-EPI Creatinine Equation (2021)    Anion gap 9 5 - 15    Comment: Performed at [facility], [address] [place], [place] [address]  Ethanol     Status: None   Collection Time: 03/24/23 12:40 PM  Result Value Ref Range   Alcohol, Ethyl (B) <10 <10 mg/dL    Comment: (NOTE) Lowest detectable limit for serum alcohol is 10 mg/dL.  For medical purposes only. Performed at [facility], [address] [place], [place] [address]   Urine rapid drug screen (hosp performed)     Status: None   Collection Time: 03/24/23 12:40 PM  Result Value Ref Range   Opiates NONE DETECTED NONE DETECTED   Cocaine NONE DETECTED NONE DETECTED   Benzodiazepines NONE DETECTED NONE DETECTED   Amphetamines NONE DETECTED NONE DETECTED   Tetrahydrocannabinol NONE DETECTED NONE DETECTED   Barbiturates NONE DETECTED NONE DETECTED    Comment: (NOTE) DRUG SCREEN FOR MEDICAL PURPOSES ONLY.  IF CONFIRMATION IS NEEDED FOR ANY PURPOSE, NOTIFY LAB WITHIN 5 DAYS.  LOWEST DETECTABLE LIMITS FOR URINE DRUG SCREEN Drug Class                     Cutoff (ng/mL) Amphetamine and metabolites    1000 Barbiturate  and metabolites    200 Benzodiazepine                 200 Opiates and metabolites        300 Cocaine and metabolites        300 THC                            50 Performed at [facility], [address] [place], [place] [address]    CT HEAD CODE STROKE WO CONTRAST  Result Date: 03/24/2023 CLINICAL DATA:  Code stroke. Neuro deficit, acute, stroke suspected. Right hand and arm weakness/numbness. EXAM: CT HEAD WITHOUT CONTRAST TECHNIQUE: Contiguous axial images were obtained from the base of the skull through the vertex without intravenous contrast. RADIATION DOSE REDUCTION: This exam was performed according to the departmental dose-optimization program which includes automated exposure control, adjustment of the mA and/or kV according to patient size and/or use of iterative reconstruction technique. COMPARISON:  None. FINDINGS: Brain: No age advanced or lobar predominant parenchymal atrophy. There is a small focus hypodensity within the left frontal lobe centrum semiovale (for instance as seen on series 5, image 33). There is no acute intracranial hemorrhage. No demarcated cortical infarct. No extra-axial fluid collection. No evidence of an intracranial mass. No midline shift. Vascular: No hyperdense vessel.  Atherosclerotic calcifications. Skull: No calvarial fracture or aggressive osseous lesion. Sinuses/Orbits: No mass or acute finding within the imaged orbits.  No significant paranasal sinus disease. ASPECTS (Alberta Stroke Program Early CT Score) - Ganglionic level infarction (caudate, lentiform nuclei, internal capsule, insula, M1-M3 cortex): 7 - Supraganglionic infarction (M4-M6 cortex): 3 Total score (0-10 with 10 being normal): 10 Impression #1 called by telephone at the time of interpretation on 03/24/2023 at 12:38 pm to provider ADAM CURATOLO , who verbally acknowledged these results. IMPRESSION: 1. Small focus of hypodensity within the left frontal lobe white matter, which may reflect  small vessel ischemic disease or an acute white matter infarct. Consider a brain MRI for further evaluation. 2. No acute intracranial hemorrhage or acute demarcated cortical infarct. Electronically Signed   By: Jackey Loge D.O.   On: 03/24/2023 12:39    Pending Labs Unresulted Labs (From admission, onward)    None       Vitals/Pain Today's Vitals   03/24/23 1148 03/24/23 1151  BP:  (!) 151/81  Pulse:  74  Resp:  18  Temp:  98.3 F (36.8 C)  TempSrc:  Temporal  SpO2:  100%  PainSc: 0-No pain     Isolation Precautions No active isolations  Medications Medications  sodium chloride flush (NS) 0.9 % injection 3 mL (3 mLs Intravenous Not Given 03/24/23 1245)    Mobility walks     Focused Assessments Neuro Assessment Handoff:  Swallow screen pass? Yes    NIH Stroke Scale  Dizziness Present: No Headache Present: No Interval: Initial Level of Consciousness (1a.)   : Alert, keenly responsive LOC Questions (1b. )   : Answers both questions correctly LOC Commands (1c. )   : Performs both tasks correctly Best Gaze (2. )  : Normal Visual (3. )  : No visual loss Facial Palsy (4. )    : Normal symmetrical movements Motor Arm, Left (5a. )   : No drift Motor Arm, Right (5b. ) : Some effort against gravity Motor Leg, Left (6a. )  : No drift Motor Leg, Right (6b. ) : Some effort against gravity Limb Ataxia (7. ): Present in one limb Sensory (8. )  : Mild-to-moderate sensory loss, patient feels pinprick is less sharp or is dull on the affected side, or there is a loss of superficial pain with pinprick, but patient is aware of being touched Best Language (9. )  : No aphasia Dysarthria (10. ): Normal Extinction/Inattention (11.)   : No Abnormality Complete NIHSS TOTAL: 6     Neuro Assessment: Exceptions to WDL Neuro Checks:   Initial (03/24/23 1230)  Has TPA been given? No If patient is a Neuro Trauma and patient is going to OR before floor call report to 4N Charge nurse:  [phone] or [phone]   R Recommendations: See Admitting Provider Note  Report given to:   Additional Notes:

## 2023-03-24 NOTE — Telephone Encounter (Signed)
[name], Son called and stated that he is taking patient to the ER at [facility] on [address] [address].   Stated that patient is complaining of Right Hand and Arm Weakness and Numbness and stated that she is feeling weak and with no energy.   They just wanted to inform Provider. (Forwarded to [employer] due to [place] out of office)

## 2023-03-24 NOTE — H&P (Signed)
History and Physical    [name] ZOX:[id number] DOB: [birth year] DOA: 03/24/2023  PCP: Sharon Seller, NP  Patient coming from: [facility]  I have personally briefly reviewed patient's old medical records in [facility]  Chief Complaint: RUE weakness and right face paresthesia  HPI: [name] is a 89 y.o. female with medical history significant of  GERD, HLD, morbid obesity ,  djd of the spine with chronic neck pain as well as radiculopathy of right upper extremity for which she was evaluated by neurosurgery due to Spondylolisthesis of cervical spine. Patient of note has interim history of office visit 03/18/23 with complaints of dizziness and right upper extremity more so in right hand few days prior to visit. At that visit she was thought to have exacerbation of her know cervical radiculopathy and was prescribed steroid pack.  Patient it appears since then has had persistent symptoms which acutely got worse hours prior to presentation to [facility]. Per family patient was noted to have episode of word finding difficulties two days ago which was transient and resolved on its own. However today hours PTA patient was noted to have persistent right arm weakness now associated with tingling and numbness of her right leg as well as questionable right side facial paresthesias.   ED Course Patient placed on CODE stroke protocol due to acute progression of RUE weakness Vitals: afeb ;(8.3, bp 151/81 , hr 74, rr 18   sat 100% ) Lab  UA neg  Etoh :neg Na 136, K3.9 cl103, cr 0.88 UDS:neg  CTH IMPRESSION: 1. Small focus of hypodensity within the left frontal lobe white matter, which may reflect small vessel ischemic disease or an acute white matter infarct. Consider a brain MRI for further evaluation. 2. No acute intracranial hemorrhage or acute demarcated cortical infarct.  EKG sinus rhythm   MRI cervical spine IMPRESSION: 1. No acute abnormality  within the cervical spine or spinal cord. 2. Multilevel cervical spondylosis with resultant mild to moderate spinal stenosis at C5-6 and C6-7. 3. Multifactorial degenerative changes with resultant multilevel foraminal narrowing as above. Notable findings include mild left C3 and right C5 foraminal stenosis, severe right worse than left C6 foraminal narrowing, with moderate left and mild to moderate right C7 foraminal stenosis.    MRI IMPRESSION: 1. Scattered patchy acute to early subacute ischemic infarcts involving the cortical and subcortical aspect of the left frontal and parietal lobes. No associated hemorrhage or significant regional mass effect. 2. Underlying mild chronic microvascular ischemic disease.  Review of Systems: As per HPI otherwise 10 point review of systems negative.   Past Medical History:  Diagnosis Date   Arthritis    Arthropathy, unspecified, site unspecified    Dr. Noel Gerold...right clavicular head swelling 2003   Colon polyp    colonoscopy 06-24-06 with no adenomatous change, hyperplasia  only   Environmental allergies    GERD (gastroesophageal reflux disease)    Healthcare maintenance    CPX 12-24-09, Td 1/05, pneumovax 2000 age 69, GYN [name] ([place])   Hyperlipidemia    target less than 160 > try off muscle aches 03-18-10   Morbid obesity (HCC)    ideal = 142.  target wt = 158 for BMI < 30   Osteopenia    BMD 08-07-08 tspin - 1.2, left femur - .4, right femur +.2.  repeat 01-02-10  1.9    0.1   0.5   Rhinitis, chronic    Dr. Linward Foster...on  immunotherapy since 5/08    Past Surgical History:  Procedure Laterality Date   BUNIONECTOMY  1982   bilateral   CHOLECYSTECTOMY N/A 07/17/2020   Procedure: LAPAROSCOPIC CHOLECYSTECTOMY;  Surgeon: Axel Filler, MD;  Location: [facility] OR;  Service: General;  Laterality: N/A;   FOOT SURGERY  1970   bilateral 5th toes, bones removed   GANGLION CYST EXCISION  2000   3rd finger right hand   TUBAL LIGATION   1971     reports that she quit smoking about 67 years ago. Her smoking use included cigarettes. She started smoking about 72 years ago. She has a 2.5 pack-year smoking history. She has never used smokeless tobacco. She reports that she does not drink alcohol and does not use drugs.  No Known Allergies  Family History  Problem Relation Age of Onset   Colon cancer Mother        (in computer for recall colonoscopy 10/12)   Healthy Daughter        x3   Healthy Son     Prior to Admission medications   Medication Sig Start Date End Date Taking? Authorizing Provider  acetaminophen (TYLENOL) 500 MG tablet Take 500 mg by mouth every 6 (six) hours as needed (pain).    Yes [provider]  Carboxymethylcellul-Glycerin (LUBRICATING EYE DROPS OP) Place 1 drop into both eyes daily as needed (dry eyes).   Yes [provider]  cetirizine (ZYRTEC) 10 MG tablet Take 10 mg by mouth at bedtime as needed for allergies (drippy nose, drainage, sneezing).   Yes [provider]  Cholecalciferol (VITAMIN D3) 25 MCG (1000 UT) capsule Take 1,000 Units by mouth daily.   Yes [provider]  Cyanocobalamin (B-12 PO) Take 1 tablet by mouth daily.   Yes [provider]  Ensure (ENSURE) Take 237 mLs by mouth 2 (two) times daily between meals.   Yes [provider]  fluticasone (FLONASE) 50 MCG/ACT nasal spray Place 1 spray into both nostrils daily as needed for allergies. 07/13/16  Yes [provider]  hydrocortisone 2.5 % cream Apply 1 application topically 2 (two) times daily as needed (itching).    Yes [provider]  pantoprazole (PROTONIX) 40 MG tablet Take 1 tablet (40 mg total) by mouth daily. 09/22/22  Yes Zehr, Princella Pellegrini, PA-C  predniSONE (STERAPRED UNI-PAK 21 TAB) 10 MG (21) TBPK tablet Use as directed Patient taking differently: Take 10 mg by mouth See admin instructions. Use as directed on dose pack 03/18/23  Yes Sharon Seller, NP   rosuvastatin (CRESTOR) 10 MG tablet TAKE 1 TABLET BY MOUTH EVERY DAY 03/07/23  Yes Sharon Seller, NP    Physical Exam: Vitals:   03/24/23 1400 03/24/23 1430 03/24/23 1500 03/24/23 1626  BP: (!) 164/59 (!) 140/56 (!) 140/59 (!) 156/98  Pulse: 62 (!) 57 (!) 56 65  Resp:  15 14   Temp:    98.2 F (36.8 C)  TempSrc:    Oral  SpO2: 99% 98% 99% 100%    Constitutional: NAD, calm, comfortable Vitals:   03/24/23 1400 03/24/23 1430 03/24/23 1500 03/24/23 1626  BP: (!) 164/59 (!) 140/56 (!) 140/59 (!) 156/98  Pulse: 62 (!) 57 (!) 56 65  Resp:  15 14   Temp:    98.2 F (36.8 C)  TempSrc:    Oral  SpO2: 99% 98% 99% 100%   Eyes: PERRL, lids and conjunctivae normal ENMT: Mucous membranes are moist. Posterior pharynx clear of any exudate or  lesions.Normal dentition.  Neck: normal, supple, no masses, no thyromegaly Respiratory: clear to auscultation bilaterally, no wheezing, no crackles. Normal respiratory effort. No accessory muscle use.  Cardiovascular: Regular rate and rhythm, no murmurs / rubs / gallops. No extremity edema. 2+ pedal pulses.  Abdomen: no tenderness, no masses palpated. No hepatosplenomegaly. Bowel sounds positive.  Musculoskeletal: no clubbing / cyanosis. No joint deformity upper and lower extremities. Good ROM, no contractures. Normal muscle tone.  Skin: no rashes, lesions, ulcers. No induration Neurologic: CN 2-12 grossly intact. Sensation intact, Strength 5/5 in all 4. Except RUE 3/5 decrease grip  Psychiatric: Normal judgment and insight. Alert and oriented x 3. Normal mood.    Labs on Admission: I have personally reviewed following labs and imaging studies  CBC: Recent Labs  Lab 03/18/23 1354 03/24/23 1240  WBC 3.9 7.5  NEUTROABS 1,833 5.1  HGB 11.8 13.9  HCT 35.2 41.1  MCV 96.7 97.2  PLT 225 216   Basic Metabolic Panel: Recent Labs  Lab 03/18/23 1354 03/24/23 1240  NA 141 136  K 4.1 3.9  CL 106 103  CO2 27 24  GLUCOSE 80 105*  BUN 15 22   CREATININE 0.74 0.88  CALCIUM 9.6 9.3   GFR: Estimated Creatinine Clearance: 37.7 mL/min (by C-G formula based on SCr of 0.88 mg/dL). Liver Function Tests: Recent Labs  Lab 03/18/23 1354 03/24/23 1240  AST 19 25  ALT 17 30  ALKPHOS  --  55  BILITOT 0.8 0.8  PROT 6.4 7.3  ALBUMIN  --  4.0   No results for input(s): "LIPASE", "AMYLASE" in the last 168 hours. No results for input(s): "AMMONIA" in the last 168 hours. Coagulation Profile: Recent Labs  Lab 03/24/23 1240  INR 1.1   Cardiac Enzymes: No results for input(s): "CKTOTAL", "CKMB", "CKMBINDEX", "TROPONINI" in the last 168 hours. BNP (last 3 results) No results for input(s): "PROBNP" in the last 8760 hours. HbA1C: No results for input(s): "HGBA1C" in the last 72 hours. CBG: No results for input(s): "GLUCAP" in the last 168 hours. Lipid Profile: No results for input(s): "CHOL", "HDL", "LDLCALC", "TRIG", "CHOLHDL", "LDLDIRECT" in the last 72 hours. Thyroid Function Tests: No results for input(s): "TSH", "T4TOTAL", "FREET4", "T3FREE", "THYROIDAB" in the last 72 hours. Anemia Panel: No results for input(s): "VITAMINB12", "FOLATE", "FERRITIN", "TIBC", "IRON", "RETICCTPCT" in the last 72 hours. Urine analysis:    Component Value Date/Time   COLORURINE YELLOW 03/24/2023 1202   APPEARANCEUR CLEAR 03/24/2023 1202   LABSPEC 1.015 03/24/2023 1202   PHURINE 7.0 03/24/2023 1202   GLUCOSEU NEGATIVE 03/24/2023 1202   GLUCOSEU NEGATIVE 03/27/2013 1034   HGBUR NEGATIVE 03/24/2023 1202   BILIRUBINUR NEGATIVE 03/24/2023 1202   KETONESUR NEGATIVE 03/24/2023 1202   PROTEINUR NEGATIVE 03/24/2023 1202   UROBILINOGEN 0.2 03/27/2013 1034   NITRITE NEGATIVE 03/24/2023 1202   LEUKOCYTESUR NEGATIVE 03/24/2023 1202    Radiological Exams on Admission: CT HEAD CODE STROKE WO CONTRAST  Result Date: 03/24/2023 CLINICAL DATA:  Code stroke. Neuro deficit, acute, stroke suspected. Right hand and arm weakness/numbness. EXAM: CT HEAD  WITHOUT CONTRAST TECHNIQUE: Contiguous axial images were obtained from the base of the skull through the vertex without intravenous contrast. RADIATION DOSE REDUCTION: This exam was performed according to the departmental dose-optimization program which includes automated exposure control, adjustment of the mA and/or kV according to patient size and/or use of iterative reconstruction technique. COMPARISON:  None. FINDINGS: Brain: No age advanced or lobar predominant parenchymal atrophy. There is a small focus hypodensity within the  left frontal lobe centrum semiovale (for instance as seen on series 5, image 33). There is no acute intracranial hemorrhage. No demarcated cortical infarct. No extra-axial fluid collection. No evidence of an intracranial mass. No midline shift. Vascular: No hyperdense vessel.  Atherosclerotic calcifications. Skull: No calvarial fracture or aggressive osseous lesion. Sinuses/Orbits: No mass or acute finding within the imaged orbits. No significant paranasal sinus disease. ASPECTS (Alberta Stroke Program Early CT Score) - Ganglionic level infarction (caudate, lentiform nuclei, internal capsule, insula, M1-M3 cortex): 7 - Supraganglionic infarction (M4-M6 cortex): 3 Total score (0-10 with 10 being normal): 10 Impression #1 called by telephone at the time of interpretation on 03/24/2023 at 12:38 pm to provider ADAM CURATOLO , who verbally acknowledged these results. IMPRESSION: 1. Small focus of hypodensity within the left frontal lobe white matter, which may reflect small vessel ischemic disease or an acute white matter infarct. Consider a brain MRI for further evaluation. 2. No acute intracranial hemorrhage or acute demarcated cortical infarct. Electronically Signed   By: Jackey Loge D.O.   On: 03/24/2023 12:39    EKG: Independently reviewed  Assessment/Plan Acute  -right upper extremity weakness  -symptoms persistent - CT head ?new area possible infarct  -MRI new CVA: early  subacute ischemic infarcts involving the cortical and subcortical aspect of the left frontal and parietal lobes. -admit cva protocol -cta /echo pending  -ASA/plavix per protocol  -neuro checks , SLP, PT/OT  - f/u neurology final recs   DJD of cervical spine  - no acute finding on MRI of cervical spine    GERD -ppi  HLD -resume statin   Morbid obesity   DVT prophylaxis: heparin Code Status: full/ as discussed per patient wishes in event of cardiac arrest  Family Communication: none at bedside Disposition Plan: patient  expected to be admitted less than 2 midnights  Consults called: Neurology Dr Selina Cooley Admission status: progressive care    Lurline Del MD [facility]   If 7PM-7AM, please contact night-coverage [email or web address] Password [facility]  03/24/2023, 4:35 PM

## 2023-03-24 NOTE — Telephone Encounter (Signed)
[name], son called back and stated that his mother has been admitted to [facility] due to Right sided Weakness and fatigue. They are planning on doing a MRI.   FYI

## 2023-03-24 NOTE — ED Triage Notes (Addendum)
Pt presents to ED POV w/ son. Pt c/o R arm weakness that worsened 2 months ago but suddenly worsened 1h ago per son. Per son pt has an episode yesterday ~54mins where she couldn't find her words.   Pt also reports decreased sensation on R side. Since 1000 this morning.

## 2023-03-24 NOTE — Progress Notes (Signed)
CC: RUE weakness x 1-2 weeks worse over the last few hours pta  DX: Possible Acute CVA CTH: ? Area of new infarct /Small focus of hypodensity within the left frontal lobe white matter Labs: screening cbc/bmp/uds unremarkable EDP:Dr: [facility] Center: [facility]  Accepted: yes to progressive / will need neuro consult on arrival , MRI /CTA

## 2023-03-24 NOTE — Progress Notes (Signed)
Elert 1200 - pt already in CT at time of elert   Pt reports R sided weakness x1 week that is getting worse. Discussed with EDP who states that since her symptoms are getting worse he still wants a code stroke ran.  Dr. Selina Cooley paged [phone] Dr. Selina Cooley on screen 1213 call ended at 1223, pt still in CT at this time mRS 1 LNW 1 week ago Plan to transfer to [facility]

## 2023-03-24 NOTE — ED Triage Notes (Signed)
Dr. Lockie Mola assessing in triage

## 2023-03-25 ENCOUNTER — Inpatient Hospital Stay (HOSPITAL_COMMUNITY): Payer: Medicare Other

## 2023-03-25 DIAGNOSIS — I10 Essential (primary) hypertension: Secondary | ICD-10-CM | POA: Diagnosis not present

## 2023-03-25 DIAGNOSIS — R2990 Unspecified symptoms and signs involving the nervous system: Secondary | ICD-10-CM

## 2023-03-25 DIAGNOSIS — I6389 Other cerebral infarction: Secondary | ICD-10-CM

## 2023-03-25 DIAGNOSIS — I639 Cerebral infarction, unspecified: Secondary | ICD-10-CM | POA: Diagnosis not present

## 2023-03-25 LAB — LIPID PANEL
Cholesterol: 225 mg/dL — ABNORMAL HIGH (ref 0–200)
HDL: 77 mg/dL (ref >40)
LDL Cholesterol: 131 mg/dL — ABNORMAL HIGH (ref 0–99)
Total CHOL/HDL Ratio: 2.9 RATIO
Triglycerides: 86 mg/dL (ref <150)
VLDL: 17 mg/dL (ref 0–40)

## 2023-03-25 LAB — ECHOCARDIOGRAM COMPLETE
Area-P 1/2: 3.53 cm2
Height: 62 in
S' Lateral: 2 cm
Weight: 2303.37 oz

## 2023-03-25 MED ORDER — ROSUVASTATIN CALCIUM 20 MG PO TABS
20.0000 mg | ORAL_TABLET | Freq: Every day | ORAL | Status: DC
  Administered 2023-03-26 – 2023-03-28 (×3): 20 mg via ORAL
  Filled 2023-03-25 (×3): qty 1

## 2023-03-25 NOTE — Evaluation (Signed)
Speech Language Pathology Evaluation Patient Details Name: [name] MRN: [id number] DOB: Sep 12, 1930 Today's Date: 03/25/2023 Time: 4098-1191 SLP Time Calculation (min) (ACUTE ONLY): 23 min  Problem List:  Patient Active Problem List   Diagnosis Date Noted   Acute CVA (cerebrovascular accident) (HCC) 03/24/2023   Pain and swelling of left lower leg 04/29/2022   Abdominal pain 05/05/2020   Gastroesophageal reflux disease 06/06/2019   Other dysphagia 05/08/2019   Abdominal pain, epigastric 05/08/2019   Constipation 05/08/2019   Chest pain, musculoskeletal 06/14/2017   Seasonal allergic conjunctivitis 07/13/2016   Upper airway cough syndrome 06/29/2016   Neck pain on left side 08/13/2015   Other allergic rhinitis 05/23/2015   Back pain with sciatica on L  08/18/2014   Paresthesia of bilateral legs 06/11/2014   Health care maintenance 12/17/2013   Acute upper respiratory infection 01/14/2013   Essential hypertension 08/03/2012   Osteoporosis 12/24/2009   COLONIC POLYPS, HYPERPLASTIC 11/23/2007   Hyperlipidemia 08/15/2007   Morbid obesity (HCC) 08/15/2007   RHINITIS, CHRONIC 08/15/2007   Arthropathy 08/15/2007   Past Medical History:  Past Medical History:  Diagnosis Date   Arthritis    Arthropathy, unspecified, site unspecified    Dr. Noel Gerold...right clavicular head swelling 2003   Colon polyp    colonoscopy 06-24-06 with no adenomatous change, hyperplasia  only   Environmental allergies    GERD (gastroesophageal reflux disease)    Healthcare maintenance    CPX 12-24-09, Td 1/05, pneumovax 2000 age 64, GYN [name] ([place])   Hyperlipidemia    target less than 160 > try off muscle aches 03-18-10   Morbid obesity (HCC)    ideal = 142.  target wt = 158 for BMI < 30   Osteopenia    BMD 08-07-08 tspin - 1.2, left femur - .4, right femur +.2.  repeat 01-02-10  1.9    0.1   0.5   Rhinitis, chronic    Dr. Linward Foster...on immunotherapy since 5/08   Past Surgical  History:  Past Surgical History:  Procedure Laterality Date   BUNIONECTOMY  1982   bilateral   CHOLECYSTECTOMY N/A 07/17/2020   Procedure: LAPAROSCOPIC CHOLECYSTECTOMY;  Surgeon: Axel Filler, MD;  Location: [facility] OR;  Service: General;  Laterality: N/A;   FOOT SURGERY  1970   bilateral 5th toes, bones removed   GANGLION CYST EXCISION  2000   3rd finger right hand   TUBAL LIGATION  1971   HPI:  Pt is a 89 yo female presenting to ED 7/18 with persistent R extremity weakness/numbness. Per H&P note, pt noted dizziness and RUE numbness during office visit 7/12. MRI Brain revealed scattered patchy acute to early subacute ischemic infarcts involving cortical and subcortical aspect of L frontal and parietal lobes as well as mild chronic microvascular ischemic disease. 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 She states that she has noticed some word-finding difficulty and mild dysarthria, which her daughter confirms. Due to weakness in her dominant hand, only portions of the SLUMS were administered. She scored 19 of the available 26 points characterized primarily by deficits with memory and problem solving. She reports this performance is significantly different than baseline. Portions of the Bedside Western Aphasia Battery were administered with no deficits noted with language. Suspect pt may be having increased word-finding  difficulties at the conversation level. Recommend continued SLP f/u to address cognition and language.    SLP Assessment  SLP Recommendation/Assessment: Patient needs continued Speech Lanaguage Pathology Services SLP Visit Diagnosis: Cognitive communication deficit (R41.841)    Recommendations for follow up therapy are one component of a  multi-disciplinary discharge planning process, led by the attending physician.  Recommendations may be updated based on patient status, additional functional criteria and insurance authorization.    Follow Up Recommendations  Home health SLP    Assistance Recommended at Discharge  Intermittent Supervision/Assistance  Functional Status Assessment Patient has had a recent decline in their functional status and demonstrates the ability to make significant improvements in function in a reasonable and predictable amount of time.  Frequency and Duration min 2x/week  2 weeks      SLP Evaluation Cognition  Overall Cognitive Status: Impaired/Different from baseline Arousal/Alertness: Awake/alert Orientation Level: Oriented X4 Attention: Selective Selective Attention: Appears intact Memory: Impaired Memory Impairment: Retrieval deficit Awareness: Appears intact Problem Solving: Impaired Problem Solving Impairment: Verbal basic       Comprehension  Auditory Comprehension Overall Auditory Comprehension: Appears within functional limits for tasks assessed    Expression Expression Primary Mode of Expression: Verbal Verbal Expression Overall Verbal Expression: Appears within functional limits for tasks assessed Written Expression Dominant Hand: Right   Oral / Motor  Oral Motor/Sensory Function Overall Oral Motor/Sensory Function: Within functional limits Motor Speech Overall Motor Speech: Appears within functional limits for tasks assessed            Gwynneth Aliment, M.A., CF-SLP Speech Language Pathology, Acute Rehabilitation Services  Secure Chat preferred [phone]  03/25/2023, 10:46 AM

## 2023-03-25 NOTE — Progress Notes (Signed)
Orthopedic Tech Progress Note Patient Details:  [name] 06-24-1931 [id number]  Ortho Devices Type of Ortho Device: Arm sling Ortho Device/Splint Location: RUE Ortho Device/Splint Interventions: Ordered, Application   Post Interventions Patient Tolerated: Well  [name] A Kalif Kattner 03/25/2023, 2:01 PM

## 2023-03-25 NOTE — Evaluation (Signed)
Physical Therapy Evaluation Patient Details Name: [name] MRN: [id number] DOB: Feb 26, 1931 Today's Date: 03/25/2023  History of Present Illness  Pt is 89 yo female presenting pt admitted with persistent RUE weakness with associated tingling and numbness of her RLE as well with possible R facial paresthesias. PMH: GERD, HLD, DJD with radiculopathy.  Clinical Impression  Pt is presenting below prior level of function. Currently pt requires Min A for functional activity. Prior to admission pt was independent with driving and living with her daughter without assistance. Pt has difficulty navigating AD and has impaired balance requiring Min A to maintain balance as well as verbal cues throughout for safety with hand placement to decrease risk for falls. Due to pt high PLOF, family support, current functional status and risk for falls recommending skilled physical therapy services at a higher level of intensity and frequency on discharge from acute care hospital setting at 5-6/week in order to return to PLOF with decreased risk for falls and re-hospitalization. Pt demonstrates no signs/symptoms of cardiac/respiratory distress throughout session.         Assistance Recommended at Discharge Frequent or constant Supervision/Assistance  If plan is discharge home, recommend the following:  Can travel by private vehicle  A little help with walking and/or transfers;Assist for transportation;Help with stairs or ramp for entrance;Assistance with cooking/housework        Equipment Recommendations Other (comment) (Possibly hemiwalker, Defer to post acute)  Recommendations for Other Services  Rehab consult    Functional Status Assessment Patient has had a recent decline in their functional status and demonstrates the ability to make significant improvements in function in a reasonable and predictable amount of time.     Precautions / Restrictions Precautions Precautions: Fall Restrictions Weight  Bearing Restrictions: No      Mobility  Bed Mobility Overal bed mobility: Needs Assistance Bed Mobility: Supine to Sit     Supine to sit: Min assist     General bed mobility comments: Min A and extra time. pt requires help scooting to EOB and adjusting on EOB for safe sitting Patient Response: Cooperative  Transfers Overall transfer level: Needs assistance Equipment used: Rolling walker (2 wheels) Transfers: Sit to/from Stand, Bed to chair/wheelchair/BSC Sit to Stand: Min assist   Step pivot transfers: Min assist       General transfer comment: Pt requires Min a with verbal and tactile cues for correct hand placement for safety with sit to stand and transfers from EOB. Pt requires MIn A with navigation of AD and with maintaining balance during transitions    Ambulation/Gait Ambulation/Gait assistance: Min assist Gait Distance (Feet): 15 Feet Assistive device: Rolling walker (2 wheels) Gait Pattern/deviations: Step-through pattern, Decreased step length - left, Narrow base of support   Gait velocity interpretation: <1.31 ft/sec, indicative of household ambulator   General Gait Details: Slow gait pt requires Min A with assistance to navigate room with AD and for balance  Stairs Stairs:  (did not attempt today. Pt has a level entry if she goes in through her deck)          Wheelchair Mobility     Tilt Bed Tilt Bed Patient Response: Cooperative  Modified Rankin (Stroke Patients Only) Modified Rankin (Stroke Patients Only) Pre-Morbid Rankin Score: No symptoms Modified Rankin: Slight disability     Balance Overall balance assessment: Needs assistance Sitting-balance support: Single extremity supported Sitting balance-Leahy Scale: Good     Standing balance support: Reliant on assistive device for balance Standing balance-Leahy Scale: Fair  Standing balance comment: Needs Min A to maintain balance during dynamic mobility.         Pertinent Vitals/Pain  Pain Assessment Pain Assessment: No/denies pain    Home Living Family/patient expects to be discharged to:: Private residence Living Arrangements: Children Available Help at Discharge: Family Type of Home: House Home Access: Level entry;Stairs to enter   Entrance Stairs-Number of Steps: 3 Alternate Level Stairs-Number of Steps: full flight Home Layout: Two level Home Equipment: Cane - single point;Wheelchair - Forensic psychologist (2 wheels)      Prior Function Prior Level of Function : Independent/Modified Independent;Driving             Mobility Comments: pt reports she was ambulatory without an AD prior to admission ADLs Comments: Pt was independent with all ADL's.     Hand Dominance   Dominant Hand: Right    Extremity/Trunk Assessment   Upper Extremity Assessment Upper Extremity Assessment: Defer to OT evaluation    Lower Extremity Assessment Lower Extremity Assessment: Generalized weakness    Cervical / Trunk Assessment Cervical / Trunk Assessment: Normal  Communication   Communication: Other (comment) (pt is demonstrating some expressive difficultys and slow speech)  Cognition Arousal/Alertness: Awake/alert Behavior During Therapy: WFL for tasks assessed/performed Overall Cognitive Status: Within Functional Limits for tasks assessed          General Comments General comments (skin integrity, edema, etc.): pt family was present and very supportive.        Assessment/Plan    PT Assessment Patient needs continued PT services  PT Problem List Decreased strength;Decreased mobility;Decreased safety awareness;Decreased balance       PT Treatment Interventions DME instruction;Therapeutic activities;Gait training;Therapeutic exercise;Patient/family education;Stair training;Balance training;Functional mobility training;Neuromuscular re-education    PT Goals (Current goals can be found in the Care Plan section)  Acute Rehab PT Goals Patient Stated  Goal: To get strongr and back to independence PT Goal Formulation: With patient/family Time For Goal Achievement: 04/08/23 Potential to Achieve Goals: Good    Frequency Min 4X/week        AM-PAC PT "6 Clicks" Mobility  Outcome Measure Help needed turning from your back to your side while in a flat bed without using bedrails?: A Little Help needed moving from lying on your back to sitting on the side of a flat bed without using bedrails?: A Little Help needed moving to and from a bed to a chair (including a wheelchair)?: A Little Help needed standing up from a chair using your arms (e.g., wheelchair or bedside chair)?: A Little Help needed to walk in hospital room?: A Little Help needed climbing 3-5 steps with a railing? : A Little 6 Click Score: 18    End of Session Equipment Utilized During Treatment: Gait belt Activity Tolerance: Patient tolerated treatment well Patient left: in chair;with call bell/phone within reach;with chair alarm set;with family/visitor present Nurse Communication: Mobility status PT Visit Diagnosis: Unsteadiness on feet (R26.81);Other abnormalities of gait and mobility (R26.89);Muscle weakness (generalized) (M62.81)    Time: 1610-9604 PT Time Calculation (min) (ACUTE ONLY): 28 min   Charges:   PT Evaluation $PT Eval Low Complexity: 1 Low PT Treatments $Therapeutic Activity: 8-22 mins PT General Charges $$ ACUTE PT VISIT: 1 Visit         Harrel Carina, DPT, CLT  Acute Rehabilitation Services Office: [phone] (Secure chat preferred)   Claudia Desanctis 03/25/2023, 12:14 PM

## 2023-03-25 NOTE — Progress Notes (Signed)
  Echocardiogram 2D Echocardiogram has been performed.  Delcie Roch 03/25/2023, 10:42 AM

## 2023-03-25 NOTE — Evaluation (Signed)
Occupational Therapy Evaluation Patient Details Name: [name] MRN: [id number] DOB: Jun 21, 1931 Today's Date: 03/25/2023   History of Present Illness Pt is 89 yo female presenting pt admitted with persistent RUE weakness with associated tingling and numbness of her RLE as well with possible R facial paresthesias. PMH: GERD, HLD, DJD with radiculopathy.   Clinical Impression   PTA, pt lives with daughter and typically completely Independent with ADLs, IADLs and mobility. Pt active in community at baseline. Pt presents now with deficits in dominant R UE strength/coordination and dynamic standing balance. Trialed hemiwalker w/ pt requiring Min A to min guard for bathroom mobility w/ cues for optimal DME use. Due to impaired RUE function, pt requiring up to Mod A for ADL completion. Pt's daughter present, very supportive and involved in education re: fine motor HEP, self ROM vs AROM and caregiver assistance with PROM (plan to further address in next session). Based on high PLOF and pt motivation, feel pt would progress to a Modified Independent level with intensive rehab services.  Consulted MD for order placement for sling to protect R hemiparetic UE with OOB activity in next sessions.     Recommendations for follow up therapy are one component of a multi-disciplinary discharge planning process, led by the attending physician.  Recommendations may be updated based on patient status, additional functional criteria and insurance authorization.   Assistance Recommended at Discharge Intermittent Supervision/Assistance  Patient can return home with the following A little help with walking and/or transfers;A lot of help with bathing/dressing/bathroom;Assistance with cooking/housework;Assist for transportation;Help with stairs or ramp for entrance    Functional Status Assessment  Patient has had a recent decline in their functional status and demonstrates the ability to make significant  improvements in function in a reasonable and predictable amount of time.  Equipment Recommendations  Other (comment) (TBD pending progress)    Recommendations for Other Services Rehab consult     Precautions / Restrictions Precautions Precautions: Fall;Other (comment) Precaution Comments: sling for OOB activity as needed for RUE Restrictions Weight Bearing Restrictions: No      Mobility Bed Mobility               General bed mobility comments: in recliner on entry    Transfers Overall transfer level: Needs assistance Equipment used: Hemi-walker Transfers: Sit to/from Stand Sit to Stand: Min assist           General transfer comment: Min A to stand from recliner with cues for hemiwalker placement and L UE on chair armrest and Min A from low toilet due to chronic R knee deficits      Balance Overall balance assessment: Needs assistance Sitting-balance support: Single extremity supported Sitting balance-Leahy Scale: Good     Standing balance support: Reliant on assistive device for balance Standing balance-Leahy Scale: Fair                             ADL either performed or assessed with clinical judgement   ADL Overall ADL's : Needs assistance/impaired Eating/Feeding: Set up;Sitting Eating/Feeding Details (indicate cue type and reason): assist to cut up food, able to feed with LUE Grooming: Minimal assistance;Standing   Upper Body Bathing: Sitting;Moderate assistance   Lower Body Bathing: Moderate assistance;Sit to/from stand   Upper Body Dressing : Moderate assistance;Sitting   Lower Body Dressing: Moderate assistance;Sit to/from stand   Toilet Transfer: Minimal assistance;Min guard;Ambulation Toilet Transfer Details (indicate cue type and reason): trial of hemiwalker  with initial Min A and cues on placement of device for safe sequencing with progression to min guard. Min A to stand from regular toilet height Toileting- Clothing  Manipulation and Hygiene: Minimal assistance;Sitting/lateral lean;Sit to/from stand Toileting - Clothing Manipulation Details (indicate cue type and reason): able to perform peri care seated with L UE     Functional mobility during ADLs: Min guard;Minimal assistance General ADL Comments: Deficits in R dominant UE. Provided multiple handouts for fine motor tasks, AROM, self ROM and general instruction for caregiver ROM     Vision Baseline Vision/History: 1 Wears glasses Ability to See in Adequate Light: 0 Adequate Patient Visual Report: No change from baseline Vision Assessment?: No apparent visual deficits     Perception     Praxis      Pertinent Vitals/Pain Pain Assessment Pain Assessment: No/denies pain     Hand Dominance Right   Extremity/Trunk Assessment Upper Extremity Assessment Upper Extremity Assessment: RUE deficits/detail;LUE deficits/detail RUE Deficits / Details: with gravity minimized, able to make light fist though not able to functionally hold items yet, minimal wrist extension/flexion, impaired elbow flexion/extension - often turning into shoulder movement. shoulder abduction to 90*, shoulder flexion actively to 20* - often turning into abduction. arthritic changes to digits RUE Coordination: decreased fine motor;decreased gross motor LUE Deficits / Details: functional at all joints, some arthritic changes to hand   Lower Extremity Assessment Lower Extremity Assessment: Defer to PT evaluation   Cervical / Trunk Assessment Cervical / Trunk Assessment: Normal   Communication Communication Communication: No difficulties   Cognition Arousal/Alertness: Awake/alert Behavior During Therapy: WFL for tasks assessed/performed Overall Cognitive Status: Within Functional Limits for tasks assessed                                       General Comments  Daughter present and hands on to assist    Exercises     Shoulder Instructions      Home  Living Family/patient expects to be discharged to:: Private residence Living Arrangements: Children Available Help at Discharge: Family Type of Home: House Home Access: Level entry;Stairs to enter Entrance Stairs-Number of Steps: 3   Home Layout: Two level Alternate Level Stairs-Number of Steps: full flight   Bathroom Shower/Tub: Chief Strategy Officer: Standard Bathroom Accessibility: Yes   Home Equipment: Cane - single point;Wheelchair - Forensic psychologist (2 wheels);Toilet riser      Lives With: Daughter    Prior Functioning/Environment Prior Level of Function : Independent/Modified Independent;Driving             Mobility Comments: pt reports she was ambulatory without an AD prior to admission ADLs Comments: Pt was independent with all ADLs, IADLs, driving, going to church, very active        OT Problem List: Decreased strength;Decreased activity tolerance;Impaired balance (sitting and/or standing);Impaired UE functional use;Decreased knowledge of use of DME or AE;Decreased coordination      OT Treatment/Interventions: Self-care/ADL training;Therapeutic exercise;Energy conservation;DME and/or AE instruction;Therapeutic activities;Patient/family education    OT Goals(Current goals can be found in the care plan section) Acute Rehab OT Goals Patient Stated Goal: hopeful to go to rehab at hospital and regain full independence OT Goal Formulation: With patient/family Time For Goal Achievement: 04/08/23 Potential to Achieve Goals: Good  OT Frequency: Min 2X/week    Co-evaluation              AM-PAC OT "  6 Clicks" Daily Activity     Outcome Measure Help from another person eating meals?: A Little Help from another person taking care of personal grooming?: A Little Help from another person toileting, which includes using toliet, bedpan, or urinal?: A Little Help from another person bathing (including washing, rinsing, drying)?: A Lot Help from  another person to put on and taking off regular upper body clothing?: A Lot Help from another person to put on and taking off regular lower body clothing?: A Lot 6 Click Score: 15   End of Session Equipment Utilized During Treatment: Gait belt;Other (comment) Psychologist, educational) Nurse Communication: Mobility status  Activity Tolerance: Patient tolerated treatment well Patient left: in chair;with call bell/phone within reach;with chair alarm set;with family/visitor present  OT Visit Diagnosis: Unsteadiness on feet (R26.81);Other abnormalities of gait and mobility (R26.89);Muscle weakness (generalized) (M62.81)                Time: 0865-7846 OT Time Calculation (min): 37 min Charges:  OT General Charges $OT Visit: 1 Visit OT Evaluation $OT Eval Moderate Complexity: 1 Mod OT Treatments $Self Care/Home Management : 8-22 mins  Bradd Canary, OTR/L Acute Rehab Services Office: [phone]   Lorre Munroe 03/25/2023, 12:52 PM

## 2023-03-25 NOTE — Plan of Care (Signed)
  Problem: Education: Goal: Knowledge of General Education information will improve Description: Including pain rating scale, medication(s)/side effects and non-pharmacologic comfort measures Outcome: Progressing   Problem: Clinical Measurements: Goal: Respiratory complications will improve Outcome: Progressing Goal: Cardiovascular complication will be avoided Outcome: Progressing   Problem: Activity: Goal: Risk for activity intolerance will decrease Outcome: Progressing   Problem: Nutrition: Goal: Adequate nutrition will be maintained Outcome: Progressing   Problem: Coping: Goal: Level of anxiety will decrease Outcome: Progressing   Problem: Skin Integrity: Goal: Risk for impaired skin integrity will decrease Outcome: Progressing   Problem: Self-Care: Goal: Ability to participate in self-care as condition permits will improve Outcome: Progressing   Problem: Nutrition: Goal: Risk of aspiration will decrease Outcome: Progressing Goal: Dietary intake will improve Outcome: Progressing

## 2023-03-25 NOTE — Progress Notes (Signed)
Inpatient Rehab Admissions Coordinator Note:   Per therapy recommendations patient was screened for CIR candidacy by Stephania Fragmin, PT. At this time, pt appears to be a potential candidate for CIR. I will place an order for rehab consult for full assessment, per our protocol.  Please contact me any with questions.Estill Dooms, PT, DPT [phone] 03/25/23 4:07 PM

## 2023-03-25 NOTE — Progress Notes (Signed)
[facility]                                                                               [name], is a 89 y.o. female, DOB - April 06, 1931, QMV:[id number] Admit date - 03/24/2023    Outpatient Primary MD for the patient is [name], Mackenzie Harvey, NP  LOS - 1  days    Brief summary   89 y.o. female with medical history significant of  GERD, HLD, morbid obesity ,  djd of the spine with chronic neck pain as well as radiculopathy of right upper extremity for which she was evaluated by neurosurgery due to Spondylolisthesis of cervical spine, presents with dizziness, numbness and right upper extremity weakness.    Assessment & Plan    Assessment and Plan:  Acute ischemic infarct:  MRI brain shows left parietal and frontal lobe  scattered acute to sub acute patchy infarcts.  A1c is 6, LDL is 131,  Echo shows LVEF of 60 to 65%, with grade 1 diastolic dysfunction with trivial MVR.  Neurology consulted, recommended aspirin and plavix for 3 weeks followed by aspirin alone.  Increase Crestor 20 mg daily.  Neurology recommends event monitor placement for evaluation of atrial fibrillation  Therapy evals pending.    Prediabetes:  A1c is 6.   Hypertension:  Permissive hypertension 220/110 mmhg for 24-48 hours from admission.   Hyperlipidemia:  Resume crestor 20 mg daily.   Tobacco abuse;  Not smoking currently.     Estimated body mass index is 26.33 kg/m as calculated from the following:   Height as of this encounter: 5\' 2"  (1.575 m).   Weight as of this encounter: 65.3 kg.  Code Status: full code.  DVT Prophylaxis:  heparin injection 5,000 Units Start: 03/24/23 2200   Level of Care: Level of care: Progressive Family Communication: family at bedside.   Disposition Plan:     Remains inpatient appropriate:  pending CTA of the head and neck.   Procedures:  MRI brain.  Echocardiogram.  CTA of the head ane neck.   Consultants:   Neurology.    Antimicrobials:   Anti-infectives (From admission, onward)    None        Medications  Scheduled Meds:  aspirin  81 mg Oral Daily   cholecalciferol  1,000 Units Oral Daily   clopidogrel  75 mg Oral Daily   feeding supplement  237 mL Oral BID BM   heparin  5,000 Units Subcutaneous Q8H   pantoprazole  40 mg Oral Daily   rosuvastatin  10 mg Oral Daily   sodium chloride flush  3 mL Intravenous Once   Continuous Infusions:  sodium chloride 75 mL/hr at 03/24/23 2037   PRN Meds:.acetaminophen **OR** acetaminophen (TYLENOL) oral liquid 160 mg/5 mL **OR** acetaminophen, loratadine, polyvinyl alcohol, senna-docusate    Subjective:   [name] was seen and examined today.  Right upper extremity improving.   Objective:   Vitals:   03/25/23 0410 03/25/23 0822 03/25/23 1240 03/25/23 1538  BP: (!) 117/59 139/61 (!) 93/48 (!) 114/51  Pulse: (!) 52 (!) 57 80 65  Resp: 16 18 18 18   Temp: 98 F (36.7 C) 97.8 F (  36.6 C) 98 F (36.7 C) 97.9 F (36.6 C)  TempSrc: Oral     SpO2: 100% 100% 100% 99%  Weight:      Height:        Intake/Output Summary (Last 24 hours) at 03/25/2023 1554 Last data filed at 03/25/2023 0300 Gross per 24 hour  Intake 838.25 ml  Output --  Net 838.25 ml   Filed Weights   03/24/23 1626  Weight: 65.3 kg     Exam General exam: Appears calm and comfortable  Respiratory system: Clear to auscultation. Respiratory effort normal. Cardiovascular system: S1 & S2 heard, RRR. No JVD,  Gastrointestinal system: Abdomen is nondistended, soft and nontender.  Central nervous system: Alert and oriented. Right upper extremity strength improving.  Extremities: Symmetric 5 x 5 power. Skin: No rashes, Psychiatry:Mood & affect appropriate.    Data Reviewed:  I have personally reviewed following labs and imaging studies   CBC Lab Results  Component Value Date   WBC 7.0 03/24/2023   RBC 4.06 03/24/2023   HGB 13.1 03/24/2023   HCT 39.1 03/24/2023    MCV 96.3 03/24/2023   MCH 32.3 03/24/2023   PLT 231 03/24/2023   MCHC 33.5 03/24/2023   RDW 13.2 03/24/2023   LYMPHSABS 1.8 03/24/2023   MONOABS 0.5 03/24/2023   EOSABS 0.0 03/24/2023   BASOSABS 0.0 03/24/2023     Last metabolic panel Lab Results  Component Value Date   NA 136 03/24/2023   K 3.9 03/24/2023   CL 103 03/24/2023   CO2 24 03/24/2023   BUN 22 03/24/2023   CREATININE 0.87 03/24/2023   GLUCOSE 105 (H) 03/24/2023   GFRNONAA >60 03/24/2023   GFRAA 53 (L) 04/30/2020   CALCIUM 9.3 03/24/2023   PROT 7.3 03/24/2023   ALBUMIN 4.0 03/24/2023   BILITOT 0.8 03/24/2023   ALKPHOS 55 03/24/2023   AST 25 03/24/2023   ALT 30 03/24/2023   ANIONGAP 9 03/24/2023    CBG (last 3)  No results for input(s): "GLUCAP" in the last 72 hours.    Coagulation Profile: Recent Labs  Lab 03/24/23 1240  INR 1.1     Radiology Studies: ECHOCARDIOGRAM COMPLETE  Result Date: 03/25/2023    ECHOCARDIOGRAM REPORT   Patient Name:   [name] Date of Exam: 03/25/2023 Medical Rec #:  [id number]        Height:       62.0 in Accession #:    [id number]       Weight:       144.0 lb Date of Birth:  07-16-1931        BSA:          1.662 m Patient Age:    89 years         BP:           139/61 mmHg Patient Gender: F                HR:           62 bpm. Exam Location:  Inpatient Procedure: 2D Echo, Color Doppler and Cardiac Doppler Indications:    stroke  History:        Patient has no prior history of Echocardiogram examinations.                 Risk Factors:Hypertension and Dyslipidemia.  Sonographer:    Delcie Roch RDCS Referring Phys: [id number] SARA-MAIZ A THOMAS IMPRESSIONS  1. Left ventricular ejection fraction, by estimation, is 60 to 65%. The  left ventricle has normal function. The left ventricle has no regional wall motion abnormalities. There is mild asymmetric left ventricular hypertrophy of the basal-septal segment. Left ventricular diastolic parameters are consistent with Grade I  diastolic dysfunction (impaired relaxation).  2. Right ventricular systolic function is normal. The right ventricular size is normal. There is normal pulmonary artery systolic pressure.  3. Left atrial size was mild to moderately dilated.  4. The mitral valve is normal in structure. Trivial mitral valve regurgitation. No evidence of mitral stenosis.  5. The aortic valve is tricuspid. There is mild calcification of the aortic valve. Aortic valve regurgitation is mild. Aortic valve sclerosis/calcification is present, without any evidence of aortic stenosis.  6. The inferior vena cava is normal in size with greater than 50% respiratory variability, suggesting right atrial pressure of 3 mmHg. FINDINGS  Left Ventricle: Left ventricular ejection fraction, by estimation, is 60 to 65%. The left ventricle has normal function. The left ventricle has no regional wall motion abnormalities. The left ventricular internal cavity size was normal in size. There is  mild asymmetric left ventricular hypertrophy of the basal-septal segment. Left ventricular diastolic parameters are consistent with Grade I diastolic dysfunction (impaired relaxation). Right Ventricle: The right ventricular size is normal. No increase in right ventricular wall thickness. Right ventricular systolic function is normal. There is normal pulmonary artery systolic pressure. The tricuspid regurgitant velocity is 2.45 m/s, and  with an assumed right atrial pressure of 3 mmHg, the estimated right ventricular systolic pressure is 27.0 mmHg. Left Atrium: Left atrial size was mild to moderately dilated. Right Atrium: Right atrial size was normal in size. Pericardium: There is no evidence of pericardial effusion. Mitral Valve: The mitral valve is normal in structure. Trivial mitral valve regurgitation. No evidence of mitral valve stenosis. Tricuspid Valve: The tricuspid valve is normal in structure. Tricuspid valve regurgitation is mild . No evidence of tricuspid  stenosis. Aortic Valve: The aortic valve is tricuspid. There is mild calcification of the aortic valve. Aortic valve regurgitation is mild. Aortic valve sclerosis/calcification is present, without any evidence of aortic stenosis. Pulmonic Valve: The pulmonic valve was normal in structure. Pulmonic valve regurgitation is trivial. No evidence of pulmonic stenosis. Aorta: The aortic root is normal in size and structure. Venous: The inferior vena cava is normal in size with greater than 50% respiratory variability, suggesting right atrial pressure of 3 mmHg. IAS/Shunts: No atrial level shunt detected by color flow Doppler.  LEFT VENTRICLE PLAX 2D LVIDd:         3.50 cm   Diastology LVIDs:         2.00 cm   LV e' medial:    6.42 cm/s LV PW:         0.70 cm   LV E/e' medial:  9.8 LV IVS:        0.90 cm   LV e' lateral:   8.70 cm/s LVOT diam:     1.80 cm   LV E/e' lateral: 7.2 LV SV:         69 LV SV Index:   41 LVOT Area:     2.54 cm  RIGHT VENTRICLE             IVC RV Basal diam:  3.10 cm     IVC diam: 1.20 cm RV S prime:     10.10 cm/s TAPSE (M-mode): 1.5 cm LEFT ATRIUM             Index  RIGHT ATRIUM           Index LA diam:        3.00 cm 1.80 cm/m   RA Area:     10.50 cm LA Vol (A2C):   33.5 ml 20.15 ml/m  RA Volume:   21.50 ml  12.93 ml/m LA Vol (A4C):   35.0 ml 21.05 ml/m LA Biplane Vol: 35.1 ml 21.11 ml/m  AORTIC VALVE             PULMONIC VALVE LVOT Vmax:   125.00 cm/s PR End Diast Vel: 2.93 msec LVOT Vmean:  79.700 cm/s LVOT VTI:    0.271 m  AORTA Ao Root diam: 2.70 cm Ao Asc diam:  3.10 cm MITRAL VALVE                TRICUSPID VALVE MV Area (PHT): 3.53 cm     TR Peak grad:   24.0 mmHg MV Decel Time: 215 msec     TR Vmax:        245.00 cm/s MV E velocity: 62.90 cm/s MV A velocity: 107.00 cm/s  SHUNTS MV E/A ratio:  0.59         Systemic VTI:  0.27 m                             Systemic Diam: 1.80 cm Arvilla Meres MD Electronically signed by Arvilla Meres MD Signature Date/Time:  03/25/2023/11:21:31 AM    Final    MR Cervical Spine Wo Contrast  Result Date: 03/24/2023 CLINICAL DATA:  Initial evaluation for acute myelopathy. EXAM: MRI CERVICAL SPINE WITHOUT CONTRAST TECHNIQUE: Multiplanar, multisequence MR imaging of the cervical spine was performed. No intravenous contrast was administered. COMPARISON:  None Available. FINDINGS: Alignment: Reversal of the normal cervical lordosis. Trace degenerative anterolisthesis of C4 on C5 and C7 on T1, with trace retrolisthesis of C5 on C6 and C6 on C7. Findings likely chronic and facet mediated. Vertebrae: Vertebral body height maintained without acute or chronic fracture. Bone marrow signal intensity within normal limits. Few scattered benign hemangiomata noted. No worrisome osseous lesions. No abnormal marrow edema. Cord: Normal signal and morphology. Posterior Fossa, vertebral arteries, paraspinal tissues: Unremarkable. Disc levels: C2-C3: Mild disc bulge with uncovertebral spurring. Moderate left with mild right facet arthrosis. No spinal stenosis. Mild left C3 foraminal narrowing. Right neural foramina remains patent. C3-C4: Disc desiccation with minimal annular disc bulge. Moderate left with mild right facet arthrosis. No canal or foraminal stenosis. C4-C5: Disc desiccation without significant disc bulge. Right greater than left uncovertebral spurring. Right worse than left facet degeneration with associated ankylosis. No spinal stenosis. Mild right C5 foraminal narrowing. Left neural foramina remains patent. C5-C6: Mild degenerative vertebral disc space narrowing. Broad-based right eccentric disc osteophyte complex flattens and partially effaces the ventral thecal sac. Mild cord flattening without cord signal changes. Moderate spinal stenosis. Severe right worse than left C6 foraminal narrowing. C6-C7: Degenerative intervertebral disc space narrowing with diffuse disc osteophyte complex. Broad posterior component flattens and partially faces  the ventral thecal sac. Mild spinal stenosis. Moderate left with mild to moderate right C7 foraminal narrowing. C7-T1: Minimal annular disc bulge. Mild facet hypertrophy. No significant canal or foraminal stenosis. T1-2: Diffuse disc bulge. Facet and ligament flavum hypertrophy. No significant spinal stenosis. Moderate bilateral foraminal narrowing. IMPRESSION: 1. No acute abnormality within the cervical spine or spinal cord. 2. Multilevel cervical spondylosis with resultant mild to moderate spinal stenosis at C5-6 and  C6-7. 3. Multifactorial degenerative changes with resultant multilevel foraminal narrowing as above. Notable findings include mild left C3 and right C5 foraminal stenosis, severe right worse than left C6 foraminal narrowing, with moderate left and mild to moderate right C7 foraminal stenosis. Electronically Signed   By: Rise Mu M.D.   On: 03/24/2023 20:17   MR BRAIN WO CONTRAST  Result Date: 03/24/2023 CLINICAL DATA:  Initial evaluation for neuro deficit, stroke suspected. EXAM: MRI HEAD WITHOUT CONTRAST TECHNIQUE: Multiplanar, multiecho pulse sequences of the brain and surrounding structures were obtained without intravenous contrast. COMPARISON:  Prior CT from earlier the same day. FINDINGS: Brain: Cerebral volume within normal limits. Mild patchy T2/FLAIR hyperintensity noted involving the periventricular and deep white matter both cerebral hemispheres, most characteristic of chronic microvascular ischemic disease, mild for age. Scattered patchy foci of restricted diffusion are seen involving the cortical and subcortical aspect of the left frontal and parietal lobes, consistent with acute to early subacute ischemic infarcts (series 5, image 81). No associated hemorrhage or significant regional mass effect. No other evidence for acute or subacute ischemia. No acute intracranial hemorrhage. Single chronic microhemorrhage noted at the right frontal corona radiata, likely small vessel  related. No mass lesion or midline shift. No hydrocephalus or extra-axial fluid collection. Pituitary gland and suprasellar region within normal limits. Vascular: Major intracranial vascular flow voids are maintained. Skull and upper cervical spine: Craniocervical junction within normal limits. Bone marrow signal intensity normal. No scalp soft tissue abnormality. Sinuses/Orbits: Globes and orbital soft tissues within normal limits. Paranasal sinuses and mastoid air cells are largely clear. Other: None. IMPRESSION: 1. Scattered patchy acute to early subacute ischemic infarcts involving the cortical and subcortical aspect of the left frontal and parietal lobes. No associated hemorrhage or significant regional mass effect. 2. Underlying mild chronic microvascular ischemic disease. Electronically Signed   By: Rise Mu M.D.   On: 03/24/2023 19:51   CT HEAD CODE STROKE WO CONTRAST  Result Date: 03/24/2023 CLINICAL DATA:  Code stroke. Neuro deficit, acute, stroke suspected. Right hand and arm weakness/numbness. EXAM: CT HEAD WITHOUT CONTRAST TECHNIQUE: Contiguous axial images were obtained from the base of the skull through the vertex without intravenous contrast. RADIATION DOSE REDUCTION: This exam was performed according to the departmental dose-optimization program which includes automated exposure control, adjustment of the mA and/or kV according to patient size and/or use of iterative reconstruction technique. COMPARISON:  None. FINDINGS: Brain: No age advanced or lobar predominant parenchymal atrophy. There is a small focus hypodensity within the left frontal lobe centrum semiovale (for instance as seen on series 5, image 33). There is no acute intracranial hemorrhage. No demarcated cortical infarct. No extra-axial fluid collection. No evidence of an intracranial mass. No midline shift. Vascular: No hyperdense vessel.  Atherosclerotic calcifications. Skull: No calvarial fracture or aggressive osseous  lesion. Sinuses/Orbits: No mass or acute finding within the imaged orbits. No significant paranasal sinus disease. ASPECTS ([place] Stroke Program Early CT Score) - Ganglionic level infarction (caudate, lentiform nuclei, internal capsule, insula, M1-M3 cortex): 7 - Supraganglionic infarction (M4-M6 cortex): 3 Total score (0-10 with 10 being normal): 10 Impression #1 called by telephone at the time of interpretation on 03/24/2023 at 12:38 pm to provider ADAM CURATOLO , who verbally acknowledged these results. IMPRESSION: 1. Small focus of hypodensity within the left frontal lobe white matter, which may reflect small vessel ischemic disease or an acute white matter infarct. Consider a brain MRI for further evaluation. 2. No acute intracranial hemorrhage or acute demarcated cortical  infarct. Electronically Signed   By: Jackey Loge D.O.   On: 03/24/2023 12:39       Kathlen Mody M.D. [facility] 03/25/2023, 3:54 PM  Available via Epic secure chat 7am-7pm After 7 pm, please refer to night coverage provider listed on amion.

## 2023-03-25 NOTE — Progress Notes (Addendum)
STROKE TEAM PROGRESS NOTE   BRIEF HPI [name] is a 89 y.o. female with past medical history significant for hyperlipidemia and osteopenia who presents with right arm weakness for 1 week with acute worsening of the symptoms today and new symptoms of right-sided sensory deficits.  NIH stroke scale was 2. CT head showed no acute process. TNK was not administered due to some of the symptoms starting more than 4.5 hours ago. CTA was not performed as part of the stroke code because exam was not consistent with LVO   SIGNIFICANT HOSPITAL EVENTS 7/18: MRI positive for scattered patchy acute to subacute infarcts left frontal and parietal lobes.   INTERIM HISTORY/SUBJECTIVE Daughter at bedside, plan of care discussed.  On exam, patient is able to lift right arm but does have reduced strength in hand/fingers, decreased sensation to right leg. She is alert and oriented, able to give history with good attention.   OBJECTIVE  CBC    Component Value Date/Time   WBC 7.0 03/24/2023 1753   RBC 4.06 03/24/2023 1753   HGB 13.1 03/24/2023 1753   HCT 39.1 03/24/2023 1753   PLT 231 03/24/2023 1753   MCV 96.3 03/24/2023 1753   MCH 32.3 03/24/2023 1753   MCHC 33.5 03/24/2023 1753   RDW 13.2 03/24/2023 1753   LYMPHSABS 1.8 03/24/2023 1240   MONOABS 0.5 03/24/2023 1240   EOSABS 0.0 03/24/2023 1240   BASOSABS 0.0 03/24/2023 1240    BMET    Component Value Date/Time   NA 136 03/24/2023 1240   K 3.9 03/24/2023 1240   CL 103 03/24/2023 1240   CO2 24 03/24/2023 1240   GLUCOSE 105 (H) 03/24/2023 1240   GLUCOSE 110 (H) 06/22/2006 1144   BUN 22 03/24/2023 1240   CREATININE 0.87 03/24/2023 1753   CREATININE 0.74 03/18/2023 1354   CALCIUM 9.3 03/24/2023 1240   EGFR 77 03/18/2023 1354   GFRNONAA >60 03/24/2023 1753    IMAGING past 24 hours ECHOCARDIOGRAM COMPLETE  Result Date: 03/25/2023    ECHOCARDIOGRAM REPORT   Patient Name:   [name] Date of Exam: 03/25/2023 Medical Rec #:   [id number]        Height:       62.0 in Accession #:    [id number]       Weight:       144.0 lb Date of Birth:  04/05/1931        BSA:          1.662 m Patient Age:    89 years         BP:           139/61 mmHg Patient Gender: F                HR:           62 bpm. Exam Location:  Inpatient Procedure: 2D Echo, Color Doppler and Cardiac Doppler Indications:    stroke  History:        Patient has no prior history of Echocardiogram examinations.                 Risk Factors:Hypertension and Dyslipidemia.  Sonographer:    Delcie Roch RDCS Referring Phys: [id number] SARA-MAIZ A THOMAS IMPRESSIONS  1. Left ventricular ejection fraction, by estimation, is 60 to 65%. The left ventricle has normal function. The left ventricle has no regional wall motion abnormalities. There is mild asymmetric left ventricular hypertrophy of the basal-septal segment. Left ventricular diastolic parameters  are consistent with Grade I diastolic dysfunction (impaired relaxation).  2. Right ventricular systolic function is normal. The right ventricular size is normal. There is normal pulmonary artery systolic pressure.  3. Left atrial size was mild to moderately dilated.  4. The mitral valve is normal in structure. Trivial mitral valve regurgitation. No evidence of mitral stenosis.  5. The aortic valve is tricuspid. There is mild calcification of the aortic valve. Aortic valve regurgitation is mild. Aortic valve sclerosis/calcification is present, without any evidence of aortic stenosis.  6. The inferior vena cava is normal in size with greater than 50% respiratory variability, suggesting right atrial pressure of 3 mmHg. FINDINGS  Left Ventricle: Left ventricular ejection fraction, by estimation, is 60 to 65%. The left ventricle has normal function. The left ventricle has no regional wall motion abnormalities. The left ventricular internal cavity size was normal in size. There is  mild asymmetric left ventricular hypertrophy of the basal-septal  segment. Left ventricular diastolic parameters are consistent with Grade I diastolic dysfunction (impaired relaxation). Right Ventricle: The right ventricular size is normal. No increase in right ventricular wall thickness. Right ventricular systolic function is normal. There is normal pulmonary artery systolic pressure. The tricuspid regurgitant velocity is 2.45 m/s, and  with an assumed right atrial pressure of 3 mmHg, the estimated right ventricular systolic pressure is 27.0 mmHg. Left Atrium: Left atrial size was mild to moderately dilated. Right Atrium: Right atrial size was normal in size. Pericardium: There is no evidence of pericardial effusion. Mitral Valve: The mitral valve is normal in structure. Trivial mitral valve regurgitation. No evidence of mitral valve stenosis. Tricuspid Valve: The tricuspid valve is normal in structure. Tricuspid valve regurgitation is mild . No evidence of tricuspid stenosis. Aortic Valve: The aortic valve is tricuspid. There is mild calcification of the aortic valve. Aortic valve regurgitation is mild. Aortic valve sclerosis/calcification is present, without any evidence of aortic stenosis. Pulmonic Valve: The pulmonic valve was normal in structure. Pulmonic valve regurgitation is trivial. No evidence of pulmonic stenosis. Aorta: The aortic root is normal in size and structure. Venous: The inferior vena cava is normal in size with greater than 50% respiratory variability, suggesting right atrial pressure of 3 mmHg. IAS/Shunts: No atrial level shunt detected by color flow Doppler.  LEFT VENTRICLE PLAX 2D LVIDd:         3.50 cm   Diastology LVIDs:         2.00 cm   LV e' medial:    6.42 cm/s LV PW:         0.70 cm   LV E/e' medial:  9.8 LV IVS:        0.90 cm   LV e' lateral:   8.70 cm/s LVOT diam:     1.80 cm   LV E/e' lateral: 7.2 LV SV:         69 LV SV Index:   41 LVOT Area:     2.54 cm  RIGHT VENTRICLE             IVC RV Basal diam:  3.10 cm     IVC diam: 1.20 cm RV S  prime:     10.10 cm/s TAPSE (M-mode): 1.5 cm LEFT ATRIUM             Index        RIGHT ATRIUM           Index LA diam:        3.00 cm 1.80 cm/m  RA Area:     10.50 cm LA Vol (A2C):   33.5 ml 20.15 ml/m  RA Volume:   21.50 ml  12.93 ml/m LA Vol (A4C):   35.0 ml 21.05 ml/m LA Biplane Vol: 35.1 ml 21.11 ml/m  AORTIC VALVE             PULMONIC VALVE LVOT Vmax:   125.00 cm/s PR End Diast Vel: 2.93 msec LVOT Vmean:  79.700 cm/s LVOT VTI:    0.271 m  AORTA Ao Root diam: 2.70 cm Ao Asc diam:  3.10 cm MITRAL VALVE                TRICUSPID VALVE MV Area (PHT): 3.53 cm     TR Peak grad:   24.0 mmHg MV Decel Time: 215 msec     TR Vmax:        245.00 cm/s MV E velocity: 62.90 cm/s MV A velocity: 107.00 cm/s  SHUNTS MV E/A ratio:  0.59         Systemic VTI:  0.27 m                             Systemic Diam: 1.80 cm Arvilla Meres MD Electronically signed by Arvilla Meres MD Signature Date/Time: 03/25/2023/11:21:31 AM    Final    MR Cervical Spine Wo Contrast  Result Date: 03/24/2023 CLINICAL DATA:  Initial evaluation for acute myelopathy. EXAM: MRI CERVICAL SPINE WITHOUT CONTRAST TECHNIQUE: Multiplanar, multisequence MR imaging of the cervical spine was performed. No intravenous contrast was administered. COMPARISON:  None Available. FINDINGS: Alignment: Reversal of the normal cervical lordosis. Trace degenerative anterolisthesis of C4 on C5 and C7 on T1, with trace retrolisthesis of C5 on C6 and C6 on C7. Findings likely chronic and facet mediated. Vertebrae: Vertebral body height maintained without acute or chronic fracture. Bone marrow signal intensity within normal limits. Few scattered benign hemangiomata noted. No worrisome osseous lesions. No abnormal marrow edema. Cord: Normal signal and morphology. Posterior Fossa, vertebral arteries, paraspinal tissues: Unremarkable. Disc levels: C2-C3: Mild disc bulge with uncovertebral spurring. Moderate left with mild right facet arthrosis. No spinal stenosis. Mild  left C3 foraminal narrowing. Right neural foramina remains patent. C3-C4: Disc desiccation with minimal annular disc bulge. Moderate left with mild right facet arthrosis. No canal or foraminal stenosis. C4-C5: Disc desiccation without significant disc bulge. Right greater than left uncovertebral spurring. Right worse than left facet degeneration with associated ankylosis. No spinal stenosis. Mild right C5 foraminal narrowing. Left neural foramina remains patent. C5-C6: Mild degenerative vertebral disc space narrowing. Broad-based right eccentric disc osteophyte complex flattens and partially effaces the ventral thecal sac. Mild cord flattening without cord signal changes. Moderate spinal stenosis. Severe right worse than left C6 foraminal narrowing. C6-C7: Degenerative intervertebral disc space narrowing with diffuse disc osteophyte complex. Broad posterior component flattens and partially faces the ventral thecal sac. Mild spinal stenosis. Moderate left with mild to moderate right C7 foraminal narrowing. C7-T1: Minimal annular disc bulge. Mild facet hypertrophy. No significant canal or foraminal stenosis. T1-2: Diffuse disc bulge. Facet and ligament flavum hypertrophy. No significant spinal stenosis. Moderate bilateral foraminal narrowing. IMPRESSION: 1. No acute abnormality within the cervical spine or spinal cord. 2. Multilevel cervical spondylosis with resultant mild to moderate spinal stenosis at C5-6 and C6-7. 3. Multifactorial degenerative changes with resultant multilevel foraminal narrowing as above. Notable findings include mild left C3 and right C5 foraminal stenosis, severe right worse than left C6  foraminal narrowing, with moderate left and mild to moderate right C7 foraminal stenosis. Electronically Signed   By: Rise Mu M.D.   On: 03/24/2023 20:17   MR BRAIN WO CONTRAST  Result Date: 03/24/2023 CLINICAL DATA:  Initial evaluation for neuro deficit, stroke suspected. EXAM: MRI HEAD  WITHOUT CONTRAST TECHNIQUE: Multiplanar, multiecho pulse sequences of the brain and surrounding structures were obtained without intravenous contrast. COMPARISON:  Prior CT from earlier the same day. FINDINGS: Brain: Cerebral volume within normal limits. Mild patchy T2/FLAIR hyperintensity noted involving the periventricular and deep white matter both cerebral hemispheres, most characteristic of chronic microvascular ischemic disease, mild for age. Scattered patchy foci of restricted diffusion are seen involving the cortical and subcortical aspect of the left frontal and parietal lobes, consistent with acute to early subacute ischemic infarcts (series 5, image 81). No associated hemorrhage or significant regional mass effect. No other evidence for acute or subacute ischemia. No acute intracranial hemorrhage. Single chronic microhemorrhage noted at the right frontal corona radiata, likely small vessel related. No mass lesion or midline shift. No hydrocephalus or extra-axial fluid collection. Pituitary gland and suprasellar region within normal limits. Vascular: Major intracranial vascular flow voids are maintained. Skull and upper cervical spine: Craniocervical junction within normal limits. Bone marrow signal intensity normal. No scalp soft tissue abnormality. Sinuses/Orbits: Globes and orbital soft tissues within normal limits. Paranasal sinuses and mastoid air cells are largely clear. Other: None. IMPRESSION: 1. Scattered patchy acute to early subacute ischemic infarcts involving the cortical and subcortical aspect of the left frontal and parietal lobes. No associated hemorrhage or significant regional mass effect. 2. Underlying mild chronic microvascular ischemic disease. Electronically Signed   By: Rise Mu M.D.   On: 03/24/2023 19:51    Vitals:   03/24/23 2331 03/25/23 0410 03/25/23 0822 03/25/23 1240  BP: 124/66 (!) 117/59 139/61 (!) 93/48  Pulse: (!) 59 (!) 52 (!) 57 80  Resp: 16 16 18 18    Temp: 97.8 F (36.6 C) 98 F (36.7 C) 97.8 F (36.6 C) 98 F (36.7 C)  TempSrc: Oral Oral    SpO2: 99% 100% 100% 100%  Weight:      Height:         PHYSICAL EXAM General:  Alert, well-nourished, well-developed pleasnt elderly african american lady in no acute distress Psych:  Mood and affect appropriate for situation CV: Regular rate and rhythm on monitor Respiratory:  Regular, unlabored respirations on room air GI: Abdomen soft and nontender   NEURO:  Mental Status: AA&Ox3, patient is able to give clear and coherent history Speech/Language: speech is without dysarthria or aphasia.  Naming, repetition, fluency, and comprehension intact.  Cranial Nerves:  II: PERRL. Visual fields full.  III, IV, VI: EOMI. Eyelids elevate symmetrically.  V: Sensation is intact to light touch and symmetrical to face.  VII: Face is symmetrical resting and smiling VIII: hearing intact to voice. IX, X: Palate elevates symmetrically. Phonation is normal.  UJ:[id number] shrug 5/5. XII: tongue is midline without fasciculations. Motor: Decreased R hand strength, grip, finger strength (3/5) but intact shoulder and elbow strength. RLE 4/5 strength Tone: is normal and bulk is normal Sensation- Decreased to RLE Coordination: FTN intact bilaterally, ataxia in [place] but not out of proportion to weakness.  Gait- deferred  NIHSS:  1a Level of Conscious.: 0 1b LOC Questions: 0 1c LOC Commands: 0 2 Best Gaze: 0 3 Visual: 0 4 Facial Palsy: 0 5a Motor Arm - left: 0 5b Motor Arm - Right:1 6a  Motor Leg - Left: 0 6b Motor Leg - Right: 0 7 Limb Ataxia: 0 8 Sensory: 1 9 Best Language: 0 10 Dysarthria: 0 11 Extinct. and Inatten.: 0 TOTAL: 2   ASSESSMENT/PLAN  Acute Ischemic Infarct:  left parietal and frontal lobes Etiology:  chronic small vessel disease versus cardio-embolic, possibly due to Afib, pending full work-up  Code Stroke CT head: no acute hemorrhage. Small focus of hypodensity in left  frontal lobe.   MRI: Scattered acute to subacute patchy ischemic infarcts involving cortical and subcortical aspect of left frontal and parietal lobes. No hemorrhage.  MRI C spine: No acute abnormality.   2D Echo: LVEF 60-65%, grade 1 diastolic dysfunction, moderately dilated left atria, trivial MVR Recommend 30 day heart monitor on discharge LDL 131 HgbA1c 6.0  VTE prophylaxis - heparin SQ No antithrombotic prior to admission, now on aspirin 81 mg daily and clopidogrel 75 mg daily for 3 weeks and then aspirin alone.  Therapy recommendations:  CIR Disposition:  pending  Hypertension Home meds:  none Blood Pressure Goal: BP less than 220/110 Permissive hypertension for 24-48 hours from admission, then gradually normalize. Avoid hypotension.   Hyperlipidemia Home meds:  crestor 10mg  LDL 131, goal < 70 Increase to crestor 20mg  Continue statin at discharge  Diabetes type II, pre-diabetic Home meds:  none HgbA1c 6.0, goal < 7.0 CBGs SSI Recommend close follow-up with PCP   Tobacco Abuse Former smoker   Hospital day # 1   Pt seen by Neuro NP/APP and later by MD. Note/plan to be edited by MD as needed.    Lynnae January, DNP, AGACNP-BC [facility] Please use AMION for contact information & EPIC for messaging.  I have personally obtained history,examined this patient, reviewed notes, independently viewed imaging studies, participated in medical decision making and plan of care.ROS completed by me personally and pertinent positives fully documented  I have made any additions or clarifications directly to the above note. Agree with note above.  She presented with right hemiparesis which has been progressive for the last 10 days or so due to left hemispheric cortical and subcortical infarcts.  Recommend continue ongoing stroke evaluation by checking CT angiogram of the brain and neck, echocardiogram and lipid profile.  Aspirin and Plavix for 3 weeks followed by aspirin  alone and aggressive risk factor modification.  Physical Occupational Therapy consults.  She will likely need rehab consult.D/w daughter at bedside and answered questions. D/w Dr Sharilyn Sites.I have spent a total of 50  minutes with the patient reviewing hospital notes,  test results, labs and examining the patient as well as establishing an assessment and plan that was discussed personally with the patient.  > 50% of time was spent in direct patient care.       Delia Heady, MD Medical Director [facility] Pager: [phone] 03/25/2023 3:07 PM   To contact Stroke Continuity provider, please refer to [email or web address]. After hours, contact General Neurology

## 2023-03-25 NOTE — Discharge Summary (Incomplete)
Stroke Discharge Summary  Patient ID: [name]   MRN: [id number]      DOB: Nov 22, 1930  Date of Admission: 03/24/2023 Date of Discharge: 03/25/2023  Attending Physician:  Kathlen Mody, MD, Stroke MD Consultant(s):  Treatment Team:  Gordy Councilman, MD {consultation:[id number]}*** Patient's PCP:  Sharon Seller, NP  Discharge Diagnoses: *** Principal Problem:   Acute CVA (cerebrovascular accident) [facility])   Medications to be continued on Rehab Allergies as of 03/25/2023   No Known Allergies   Med Rec must be completed prior to using this [facility]***       LABORATORY STUDIES CBC    Component Value Date/Time   WBC 7.0 03/24/2023 1753   RBC 4.06 03/24/2023 1753   HGB 13.1 03/24/2023 1753   HCT 39.1 03/24/2023 1753   PLT 231 03/24/2023 1753   MCV 96.3 03/24/2023 1753   MCH 32.3 03/24/2023 1753   MCHC 33.5 03/24/2023 1753   RDW 13.2 03/24/2023 1753   LYMPHSABS 1.8 03/24/2023 1240   MONOABS 0.5 03/24/2023 1240   EOSABS 0.0 03/24/2023 1240   BASOSABS 0.0 03/24/2023 1240   CMP    Component Value Date/Time   NA 136 03/24/2023 1240   K 3.9 03/24/2023 1240   CL 103 03/24/2023 1240   CO2 24 03/24/2023 1240   GLUCOSE 105 (H) 03/24/2023 1240   GLUCOSE 110 (H) 06/22/2006 1144   BUN 22 03/24/2023 1240   CREATININE 0.87 03/24/2023 1753   CREATININE 0.74 03/18/2023 1354   CALCIUM 9.3 03/24/2023 1240   PROT 7.3 03/24/2023 1240   ALBUMIN 4.0 03/24/2023 1240   AST 25 03/24/2023 1240   ALT 30 03/24/2023 1240   ALKPHOS 55 03/24/2023 1240   BILITOT 0.8 03/24/2023 1240   GFRNONAA >60 03/24/2023 1753   GFRAA 53 (L) 04/30/2020 1228   COAGS Lab Results  Component Value Date   INR 1.1 03/24/2023   Lipid Panel    Component Value Date/Time   CHOL 225 (H) 03/25/2023 0309   TRIG 86 03/25/2023 0309   TRIG 76 06/22/2006 1144   HDL 77 03/25/2023 0309   CHOLHDL 2.9 03/25/2023 0309   VLDL 17 03/25/2023 0309   LDLCALC 131 (H) 03/25/2023 0309   LDLCALC 101 (H)  11/22/2022 1334   HgbA1C  Lab Results  Component Value Date   HGBA1C 6.0 (H) 03/24/2023   Urinalysis    Component Value Date/Time   COLORURINE YELLOW 03/24/2023 1202   APPEARANCEUR CLEAR 03/24/2023 1202   LABSPEC 1.015 03/24/2023 1202   PHURINE 7.0 03/24/2023 1202   GLUCOSEU NEGATIVE 03/24/2023 1202   GLUCOSEU NEGATIVE 03/27/2013 1034   HGBUR NEGATIVE 03/24/2023 1202   BILIRUBINUR NEGATIVE 03/24/2023 1202   KETONESUR NEGATIVE 03/24/2023 1202   PROTEINUR NEGATIVE 03/24/2023 1202   UROBILINOGEN 0.2 03/27/2013 1034   NITRITE NEGATIVE 03/24/2023 1202   LEUKOCYTESUR NEGATIVE 03/24/2023 1202   Urine Drug Screen     Component Value Date/Time   LABOPIA NONE DETECTED 03/24/2023 1240   COCAINSCRNUR NONE DETECTED 03/24/2023 1240   LABBENZ NONE DETECTED 03/24/2023 1240   AMPHETMU NONE DETECTED 03/24/2023 1240   THCU NONE DETECTED 03/24/2023 1240   LABBARB NONE DETECTED 03/24/2023 1240    Alcohol Level    Component Value Date/Time   ETH <10 03/24/2023 1240     SIGNIFICANT DIAGNOSTIC STUDIES ECHOCARDIOGRAM COMPLETE  Result Date: 03/25/2023    ECHOCARDIOGRAM REPORT   Patient Name:   [name] Date of Exam: 03/25/2023 Medical Rec #:  [id number]  Height:       62.0 in Accession #:    [id number]       Weight:       144.0 lb Date of Birth:  [birth year]        BSA:          1.662 m Patient Age:    89 years         BP:           139/61 mmHg Patient Gender: F                HR:           62 bpm. Exam Location:  Inpatient Procedure: 2D Echo, Color Doppler and Cardiac Doppler Indications:    stroke  History:        Patient has no prior history of Echocardiogram examinations.                 Risk Factors:Hypertension and Dyslipidemia.  Sonographer:    Delcie Roch RDCS Referring Phys: [id number] SARA-MAIZ A THOMAS IMPRESSIONS  1. Left ventricular ejection fraction, by estimation, is 60 to 65%. The left ventricle has normal function. The left ventricle has no regional wall motion  abnormalities. There is mild asymmetric left ventricular hypertrophy of the basal-septal segment. Left ventricular diastolic parameters are consistent with Grade I diastolic dysfunction (impaired relaxation).  2. Right ventricular systolic function is normal. The right ventricular size is normal. There is normal pulmonary artery systolic pressure.  3. Left atrial size was mild to moderately dilated.  4. The mitral valve is normal in structure. Trivial mitral valve regurgitation. No evidence of mitral stenosis.  5. The aortic valve is tricuspid. There is mild calcification of the aortic valve. Aortic valve regurgitation is mild. Aortic valve sclerosis/calcification is present, without any evidence of aortic stenosis.  6. The inferior vena cava is normal in size with greater than 50% respiratory variability, suggesting right atrial pressure of 3 mmHg. FINDINGS  Left Ventricle: Left ventricular ejection fraction, by estimation, is 60 to 65%. The left ventricle has normal function. The left ventricle has no regional wall motion abnormalities. The left ventricular internal cavity size was normal in size. There is  mild asymmetric left ventricular hypertrophy of the basal-septal segment. Left ventricular diastolic parameters are consistent with Grade I diastolic dysfunction (impaired relaxation). Right Ventricle: The right ventricular size is normal. No increase in right ventricular wall thickness. Right ventricular systolic function is normal. There is normal pulmonary artery systolic pressure. The tricuspid regurgitant velocity is 2.45 m/s, and  with an assumed right atrial pressure of 3 mmHg, the estimated right ventricular systolic pressure is 27.0 mmHg. Left Atrium: Left atrial size was mild to moderately dilated. Right Atrium: Right atrial size was normal in size. Pericardium: There is no evidence of pericardial effusion. Mitral Valve: The mitral valve is normal in structure. Trivial mitral valve regurgitation. No  evidence of mitral valve stenosis. Tricuspid Valve: The tricuspid valve is normal in structure. Tricuspid valve regurgitation is mild . No evidence of tricuspid stenosis. Aortic Valve: The aortic valve is tricuspid. There is mild calcification of the aortic valve. Aortic valve regurgitation is mild. Aortic valve sclerosis/calcification is present, without any evidence of aortic stenosis. Pulmonic Valve: The pulmonic valve was normal in structure. Pulmonic valve regurgitation is trivial. No evidence of pulmonic stenosis. Aorta: The aortic root is normal in size and structure. Venous: The inferior vena cava is normal in size with greater than 50% respiratory variability, suggesting right  atrial pressure of 3 mmHg. IAS/Shunts: No atrial level shunt detected by color flow Doppler.  LEFT VENTRICLE PLAX 2D LVIDd:         3.50 cm   Diastology LVIDs:         2.00 cm   LV e' medial:    6.42 cm/s LV PW:         0.70 cm   LV E/e' medial:  9.8 LV IVS:        0.90 cm   LV e' lateral:   8.70 cm/s LVOT diam:     1.80 cm   LV E/e' lateral: 7.2 LV SV:         69 LV SV Index:   41 LVOT Area:     2.54 cm  RIGHT VENTRICLE             IVC RV Basal diam:  3.10 cm     IVC diam: 1.20 cm RV S prime:     10.10 cm/s TAPSE (M-mode): 1.5 cm LEFT ATRIUM             Index        RIGHT ATRIUM           Index LA diam:        3.00 cm 1.80 cm/m   RA Area:     10.50 cm LA Vol (A2C):   33.5 ml 20.15 ml/m  RA Volume:   21.50 ml  12.93 ml/m LA Vol (A4C):   35.0 ml 21.05 ml/m LA Biplane Vol: 35.1 ml 21.11 ml/m  AORTIC VALVE             PULMONIC VALVE LVOT Vmax:   125.00 cm/s PR End Diast Vel: 2.93 msec LVOT Vmean:  79.700 cm/s LVOT VTI:    0.271 m  AORTA Ao Root diam: 2.70 cm Ao Asc diam:  3.10 cm MITRAL VALVE                TRICUSPID VALVE MV Area (PHT): 3.53 cm     TR Peak grad:   24.0 mmHg MV Decel Time: 215 msec     TR Vmax:        245.00 cm/s MV E velocity: 62.90 cm/s MV A velocity: 107.00 cm/s  SHUNTS MV E/A ratio:  0.59         Systemic  VTI:  0.27 m                             Systemic Diam: 1.80 cm Arvilla Meres MD Electronically signed by Arvilla Meres MD Signature Date/Time: 03/25/2023/11:21:31 AM    Final    MR Cervical Spine Wo Contrast  Result Date: 03/24/2023 CLINICAL DATA:  Initial evaluation for acute myelopathy. EXAM: MRI CERVICAL SPINE WITHOUT CONTRAST TECHNIQUE: Multiplanar, multisequence MR imaging of the cervical spine was performed. No intravenous contrast was administered. COMPARISON:  None Available. FINDINGS: Alignment: Reversal of the normal cervical lordosis. Trace degenerative anterolisthesis of C4 on C5 and C7 on T1, with trace retrolisthesis of C5 on C6 and C6 on C7. Findings likely chronic and facet mediated. Vertebrae: Vertebral body height maintained without acute or chronic fracture. Bone marrow signal intensity within normal limits. Few scattered benign hemangiomata noted. No worrisome osseous lesions. No abnormal marrow edema. Cord: Normal signal and morphology. Posterior Fossa, vertebral arteries, paraspinal tissues: Unremarkable. Disc levels: C2-C3: Mild disc bulge with uncovertebral spurring. Moderate left with mild right facet arthrosis. No spinal  stenosis. Mild left C3 foraminal narrowing. Right neural foramina remains patent. C3-C4: Disc desiccation with minimal annular disc bulge. Moderate left with mild right facet arthrosis. No canal or foraminal stenosis. C4-C5: Disc desiccation without significant disc bulge. Right greater than left uncovertebral spurring. Right worse than left facet degeneration with associated ankylosis. No spinal stenosis. Mild right C5 foraminal narrowing. Left neural foramina remains patent. C5-C6: Mild degenerative vertebral disc space narrowing. Broad-based right eccentric disc osteophyte complex flattens and partially effaces the ventral thecal sac. Mild cord flattening without cord signal changes. Moderate spinal stenosis. Severe right worse than left C6 foraminal narrowing.  C6-C7: Degenerative intervertebral disc space narrowing with diffuse disc osteophyte complex. Broad posterior component flattens and partially faces the ventral thecal sac. Mild spinal stenosis. Moderate left with mild to moderate right C7 foraminal narrowing. C7-T1: Minimal annular disc bulge. Mild facet hypertrophy. No significant canal or foraminal stenosis. T1-2: Diffuse disc bulge. Facet and ligament flavum hypertrophy. No significant spinal stenosis. Moderate bilateral foraminal narrowing. IMPRESSION: 1. No acute abnormality within the cervical spine or spinal cord. 2. Multilevel cervical spondylosis with resultant mild to moderate spinal stenosis at C5-6 and C6-7. 3. Multifactorial degenerative changes with resultant multilevel foraminal narrowing as above. Notable findings include mild left C3 and right C5 foraminal stenosis, severe right worse than left C6 foraminal narrowing, with moderate left and mild to moderate right C7 foraminal stenosis. Electronically Signed   By: Rise Mu M.D.   On: 03/24/2023 20:17   MR BRAIN WO CONTRAST  Result Date: 03/24/2023 CLINICAL DATA:  Initial evaluation for neuro deficit, stroke suspected. EXAM: MRI HEAD WITHOUT CONTRAST TECHNIQUE: Multiplanar, multiecho pulse sequences of the brain and surrounding structures were obtained without intravenous contrast. COMPARISON:  Prior CT from earlier the same day. FINDINGS: Brain: Cerebral volume within normal limits. Mild patchy T2/FLAIR hyperintensity noted involving the periventricular and deep white matter both cerebral hemispheres, most characteristic of chronic microvascular ischemic disease, mild for age. Scattered patchy foci of restricted diffusion are seen involving the cortical and subcortical aspect of the left frontal and parietal lobes, consistent with acute to early subacute ischemic infarcts (series 5, image 81). No associated hemorrhage or significant regional mass effect. No other evidence for acute  or subacute ischemia. No acute intracranial hemorrhage. Single chronic microhemorrhage noted at the right frontal corona radiata, likely small vessel related. No mass lesion or midline shift. No hydrocephalus or extra-axial fluid collection. Pituitary gland and suprasellar region within normal limits. Vascular: Major intracranial vascular flow voids are maintained. Skull and upper cervical spine: Craniocervical junction within normal limits. Bone marrow signal intensity normal. No scalp soft tissue abnormality. Sinuses/Orbits: Globes and orbital soft tissues within normal limits. Paranasal sinuses and mastoid air cells are largely clear. Other: None. IMPRESSION: 1. Scattered patchy acute to early subacute ischemic infarcts involving the cortical and subcortical aspect of the left frontal and parietal lobes. No associated hemorrhage or significant regional mass effect. 2. Underlying mild chronic microvascular ischemic disease. Electronically Signed   By: Rise Mu M.D.   On: 03/24/2023 19:51   CT HEAD CODE STROKE WO CONTRAST  Result Date: 03/24/2023 CLINICAL DATA:  Code stroke. Neuro deficit, acute, stroke suspected. Right hand and arm weakness/numbness. EXAM: CT HEAD WITHOUT CONTRAST TECHNIQUE: Contiguous axial images were obtained from the base of the skull through the vertex without intravenous contrast. RADIATION DOSE REDUCTION: This exam was performed according to the departmental dose-optimization program which includes automated exposure control, adjustment of the mA and/or kV according to  patient size and/or use of iterative reconstruction technique. COMPARISON:  None. FINDINGS: Brain: No age advanced or lobar predominant parenchymal atrophy. There is a small focus hypodensity within the left frontal lobe centrum semiovale (for instance as seen on series 5, image 33). There is no acute intracranial hemorrhage. No demarcated cortical infarct. No extra-axial fluid collection. No evidence of an  intracranial mass. No midline shift. Vascular: No hyperdense vessel.  Atherosclerotic calcifications. Skull: No calvarial fracture or aggressive osseous lesion. Sinuses/Orbits: No mass or acute finding within the imaged orbits. No significant paranasal sinus disease. ASPECTS ([place] Stroke Program Early CT Score) - Ganglionic level infarction (caudate, lentiform nuclei, internal capsule, insula, M1-M3 cortex): 7 - Supraganglionic infarction (M4-M6 cortex): 3 Total score (0-10 with 10 being normal): 10 Impression #1 called by telephone at the time of interpretation on 03/24/2023 at 12:38 pm to provider ADAM CURATOLO , who verbally acknowledged these results. IMPRESSION: 1. Small focus of hypodensity within the left frontal lobe white matter, which may reflect small vessel ischemic disease or an acute white matter infarct. Consider a brain MRI for further evaluation. 2. No acute intracranial hemorrhage or acute demarcated cortical infarct. Electronically Signed   By: Jackey Loge D.O.   On: 03/24/2023 12:39       HISTORY OF PRESENT ILLNESS [name] is a 89 y.o. female with past medical history significant for hyperlipidemia and osteopenia who presents with right arm weakness for 1 week with acute worsening of the symptoms today and new symptoms of right-sided sensory deficits.  NIH stroke scale was 2. CT head showed no acute process. TNK was not administered due to some of the symptoms starting more than 4.5 hours ago. CTA was not performed as part of the stroke code because exam was not consistent with LVO    SIGNIFICANT HOSPITAL EVENTS 7/18: MRI positive for scattered patchy acute to subacute infarcts left frontal and parietal lobes.    HOSPITAL COURSE Acute Ischemic Infarct:  left parietal and frontal lobes Etiology:  chronic small vessel disease versus cardio-embolic, possibly due to Afib, pending full work-up  Code Stroke CT head: no acute hemorrhage. Small focus of hypodensity in left  frontal lobe.    MRI: Scattered acute to subacute patchy ischemic infarcts involving cortical and subcortical aspect of left frontal and parietal lobes. No hemorrhage.  MRI C spine: No acute abnormality.    2D Echo: LVEF 60-65%, grade 1 diastolic dysfunction, moderately dilated left atria, trivial MVR Recommend 30 day heart monitor on discharge LDL 131 HgbA1c 6.0   VTE prophylaxis - heparin SQ No antithrombotic prior to admission, now on aspirin 81 mg daily and clopidogrel 75 mg daily for 3 weeks and then aspirin alone.   Therapy recommendations:  CIR Disposition  Hypertension Home meds:  none Blood Pressure Goal: BP less than 220/110 Permissive hypertension for 24-48 hours from admission, then gradually normalize. Avoid hypotension.    Hyperlipidemia Home meds:  crestor 10mg  LDL 131, goal < 70 Increase to crestor 20mg  Continue statin at discharge   Diabetes type II, pre-diabetic Home meds:  none HgbA1c 6.0, goal < 7.0 CBGs SSI Recommend close follow-up with PCP    Tobacco Abuse Former smoke    DISCHARGE EXAM Blood pressure (!) 114/51, pulse 65, temperature 97.9 F (36.6 C), resp. rate 18, height 5\' 2"  (1.575 m), weight 65.3 kg, SpO2 99%. PHYSICAL EXAM General:  Alert, well-nourished, well-developed pleasnt elderly african american lady in no acute distress Psych:  Mood and affect appropriate for  situation CV: Regular rate and rhythm on monitor Respiratory:  Regular, unlabored respirations on room air GI: Abdomen soft and nontender     NEURO:  Mental Status: AA&Ox3, patient is able to give clear and coherent history Speech/Language: speech is without dysarthria or aphasia.  Naming, repetition, fluency, and comprehension intact.   Cranial Nerves:  II: PERRL. Visual fields full.  III, IV, VI: EOMI. Eyelids elevate symmetrically.  V: Sensation is intact to light touch and symmetrical to face.  VII: Face is symmetrical resting and smiling VIII: hearing intact to  voice. IX, X: Palate elevates symmetrically. Phonation is normal.  ZO:[id number] shrug 5/5. XII: tongue is midline without fasciculations. Motor: Decreased R hand strength, grip, finger strength (3/5) but intact shoulder and elbow strength. RLE 4/5 strength Tone: is normal and bulk is normal Sensation- Decreased to RLE Coordination: FTN intact bilaterally, ataxia in [place] but not out of proportion to weakness.  Gait- deferred  Discharge Diet      Diet   Diet Heart Room service appropriate? Yes; Fluid consistency: Thin   liquids  DISCHARGE PLAN Disposition:  Transfer to [facility] for ongoing PT, OT and ST aspirin 81 mg daily and clopidogrel 75 mg daily for secondary stroke prevention for 3 weeks then aspirin alone. Recommend ongoing stroke risk factor control by Primary Care Physician at time of discharge from inpatient rehabilitation. Follow-up PCP Sharon Seller, NP in 2 weeks following discharge from rehab. Follow-up in [facility] in 8 weeks following discharge from rehab, office to schedule an appointment.   *** minutes were spent preparing discharge.   Pt seen by Neuro NP/APP and later by MD. Note/plan to be edited by MD as needed.    Lynnae January, DNP, AGACNP-BC [facility] Please use AMION for contact information & EPIC for messaging.

## 2023-03-26 ENCOUNTER — Inpatient Hospital Stay (HOSPITAL_COMMUNITY): Payer: Medicare Other

## 2023-03-26 DIAGNOSIS — I639 Cerebral infarction, unspecified: Secondary | ICD-10-CM | POA: Diagnosis not present

## 2023-03-26 MED ORDER — IOHEXOL 350 MG/ML SOLN
50.0000 mL | Freq: Once | INTRAVENOUS | Status: AC | PRN
  Administered 2023-03-26: 50 mL via INTRAVENOUS

## 2023-03-26 MED ORDER — SERTRALINE HCL 50 MG PO TABS
25.0000 mg | ORAL_TABLET | Freq: Every day | ORAL | Status: DC
  Administered 2023-03-26 – 2023-03-28 (×3): 25 mg via ORAL
  Filled 2023-03-26 (×3): qty 1

## 2023-03-26 NOTE — Assessment & Plan Note (Signed)
Blood pressure goal less than 130.  Managed by diet at home.

## 2023-03-26 NOTE — Assessment & Plan Note (Signed)
MRI brain showed patchy cortical and subcortical left frontal parietal lobe infarct - Non-invasive angiography showed corresponding left MCA M1 branch high-grade stenosis - Echocardiogram showed no cardiogenic source of embolism - Carotid imaging unremarkable   - Lipids ordered: LDL 131, on Crestor 20 - Aspirin and Plavix for 3 months followed by aspirin alone - Atrial fibrillation: Not present on monitoring, neurology recommended cardiac monitor at discharge - tPA not given because outside the window - Dysphagia screen ordered in ER - PT eval ordered: CIR - Nonsmoker

## 2023-03-26 NOTE — Assessment & Plan Note (Signed)
Continue Crestor 

## 2023-03-26 NOTE — Progress Notes (Signed)
Inpatient Rehab Admissions Coordinator:    I met with pt. To discuss potential CIR admit. Daughter was present for discussion. Pt is interested and daughter states that pt's children and grandchildren already rotate to to provide 24/7 support and will continue to do so after CIR. I will open a case with her insurance and pursue for admit.   Megan Salon, MS, CCC-SLP Rehab Admissions Coordinator  [phone] (celll) [phone] (office)

## 2023-03-26 NOTE — Progress Notes (Signed)
  Progress Note   Patient: [name] ATF:[id number] DOB: [birth year] DOA: 03/24/2023     2 DOS: the patient was seen and examined on 03/26/2023 at 9:54 AM      Brief hospital course: [name] is a 89 y.o. F with HLD, HTN who presented with RUE weakness, found to have patchy left frontal and parietal lobe infarct.     Assessment and Plan: * Acute CVA (cerebrovascular accident) (HCC) MRI brain showed patchy cortical and subcortical left frontal parietal lobe infarct - Non-invasive angiography showed corresponding left MCA M1 branch high-grade stenosis - Echocardiogram showed no cardiogenic source of embolism - Carotid imaging unremarkable   - Lipids ordered: LDL 131, on Crestor 20 - Aspirin and Plavix for 3 months followed by aspirin alone - Atrial fibrillation: Not present on monitoring, neurology recommended cardiac monitor at discharge - tPA not given because outside the window - Dysphagia screen ordered in ER - PT eval ordered: CIR - Nonsmoker   Essential hypertension Blood pressure goal less than 130.  Managed by diet at home.  Hyperlipidemia - Continue Crestor          Subjective: Patient is feeling well, still has right-sided weakness and contracture in the right arm.  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  Her workup is complete and she is ready for transfer to rehab whenever bed is available        Author: Alberteen Sam, MD 03/26/2023 2:29 PM  For on call review [email or web address].

## 2023-03-26 NOTE — Hospital Course (Signed)
[name] is a 89 y.o. F with HLD, HTN who presented with RUE weakness, found to have patchy left frontal and parietal lobe infarct.

## 2023-03-26 NOTE — Progress Notes (Signed)
STROKE TEAM PROGRESS NOTE   BRIEF HPI [name] is a 89 y.o. female with past medical history significant for hyperlipidemia and osteopenia who presents with right arm weakness for 1 week with acute worsening of the symptoms today and new symptoms of right-sided sensory deficits.  NIH stroke scale was 2. CT head showed no acute process. TNK was not administered due to some of the symptoms starting more than 4.5 hours ago. CTA was not performed as part of the stroke code because exam was not consistent with LVO   SIGNIFICANT HOSPITAL EVENTS 7/18: MRI positive for scattered patchy acute to subacute infarcts left frontal and parietal lobes.   INTERIM HISTORY/SUBJECTIVE Her daughter is in the room with her.  Overall she is doing well.  No worsening of weakness.  He is lower down.  Her husband died in 09/13/2022 after 60+ years of marriage.  On top that she now has a stroke.  No SI or HI.  She is agreeable to an SSRI medication.  Discussed with her CTA and echo results.  Due to atherosclerosis intracranially will do DAPT for 3 months.  Will add Zoloft   OBJECTIVE  CBC    Component Value Date/Time   WBC 7.0 03/24/2023 1753   RBC 4.06 03/24/2023 1753   HGB 13.1 03/24/2023 1753   HCT 39.1 03/24/2023 1753   PLT 231 03/24/2023 1753   MCV 96.3 03/24/2023 1753   MCH 32.3 03/24/2023 1753   MCHC 33.5 03/24/2023 1753   RDW 13.2 03/24/2023 1753   LYMPHSABS 1.8 03/24/2023 1240   MONOABS 0.5 03/24/2023 1240   EOSABS 0.0 03/24/2023 1240   BASOSABS 0.0 03/24/2023 1240    BMET    Component Value Date/Time   NA 136 03/24/2023 1240   K 3.9 03/24/2023 1240   CL 103 03/24/2023 1240   CO2 24 03/24/2023 1240   GLUCOSE 105 (H) 03/24/2023 1240   GLUCOSE 110 (H) 06/22/2006 1144   BUN 22 03/24/2023 1240   CREATININE 0.87 03/24/2023 1753   CREATININE 0.74 03/18/2023 1354   CALCIUM 9.3 03/24/2023 1240   EGFR 77 03/18/2023 1354   GFRNONAA >60 03/24/2023 1753    IMAGING past 24 hours CT  ANGIO HEAD NECK W WO CM  Result Date: 03/26/2023 CLINICAL DATA:  Stroke workup.  Extremity weakness EXAM: CT ANGIOGRAPHY HEAD AND NECK WITH AND WITHOUT CONTRAST TECHNIQUE: Multidetector CT imaging of the head and neck was performed using the standard protocol during bolus administration of intravenous contrast. Multiplanar CT image reconstructions and MIPs were obtained to evaluate the vascular anatomy. Carotid stenosis measurements (when applicable) are obtained utilizing NASCET criteria, using the distal internal carotid diameter as the denominator. RADIATION DOSE REDUCTION: This exam was performed according to the departmental dose-optimization program which includes automated exposure control, adjustment of the mA and/or kV according to patient size and/or use of iterative reconstruction technique. CONTRAST:  50mL OMNIPAQUE IOHEXOL 350 MG/ML SOLN COMPARISON:  Brain and cervical MRI from 2 days ago FINDINGS: CT HEAD FINDINGS Brain: Acute infarcts in the left cerebral hemisphere are subtle compared to prior brain MRI. No evidence of progression or hemorrhage. Age normal brain volume Vascular: No hyperdense vessel or unexpected calcification. Skull: Normal. Negative for fracture or focal lesion. Sinuses/Orbits: No acute finding. Review of the MIP images confirms the above findings CTA NECK FINDINGS Aortic arch: Atheromatous plaque with 2 vessel branching. Right carotid system: Limited atheromatous changes. Vessels are smoothly contoured and widely patent. Left carotid system: Limited atheromatous change  for age. Vessels are smoothly contoured and widely patent Vertebral arteries: No proximal subclavian or vertebral occlusion. Calcified plaque at the left vertebral origin with high-grade stenosis and non visible lumen on coronal reformats. Skeleton: No acute finding Other neck: No acute finding Upper chest: Clear apical lungs. Review of the MIP images confirms the above findings CTA HEAD FINDINGS Anterior  circulation: Atheromatous calcification of the carotid siphons with up to 30-40% narrowing at the left anterior genu. Atheromatous irregularity of intracranial branches including severe left M1 stenosis. No major branch occlusion. Posterior circulation: The vertebral and basilar arteries are diffusely patent. Hypoplastic left P1 segment. No branch occlusion, beading, or aneurysm Venous sinuses: Unremarkable for arterial timing Anatomic variants: None significant Review of the MIP images confirms the above findings IMPRESSION: 1. Acute left cerebral infarcts are associated with advanced atheromatous narrowing of the left M1 segment. 2. Mild for age atherosclerosis in the neck although there is high-grade narrowing at the left vertebral origin. Electronically Signed   By: Tiburcio Pea M.D.   On: 03/26/2023 08:01   ECHOCARDIOGRAM COMPLETE  Result Date: 03/25/2023    ECHOCARDIOGRAM REPORT   Patient Name:   [name] Date of Exam: 03/25/2023 Medical Rec #:  [id number]        Height:       62.0 in Accession #:    [id number]       Weight:       144.0 lb Date of Birth:  1931-04-11        BSA:          1.662 m Patient Age:    89 years         BP:           139/61 mmHg Patient Gender: F                HR:           62 bpm. Exam Location:  Inpatient Procedure: 2D Echo, Color Doppler and Cardiac Doppler Indications:    stroke  History:        Patient has no prior history of Echocardiogram examinations.                 Risk Factors:Hypertension and Dyslipidemia.  Sonographer:    Delcie Roch RDCS Referring Phys: [id number] SARA-MAIZ A THOMAS IMPRESSIONS  1. Left ventricular ejection fraction, by estimation, is 60 to 65%. The left ventricle has normal function. The left ventricle has no regional wall motion abnormalities. There is mild asymmetric left ventricular hypertrophy of the basal-septal segment. Left ventricular diastolic parameters are consistent with Grade I diastolic dysfunction (impaired relaxation).  2.  Right ventricular systolic function is normal. The right ventricular size is normal. There is normal pulmonary artery systolic pressure.  3. Left atrial size was mild to moderately dilated.  4. The mitral valve is normal in structure. Trivial mitral valve regurgitation. No evidence of mitral stenosis.  5. The aortic valve is tricuspid. There is mild calcification of the aortic valve. Aortic valve regurgitation is mild. Aortic valve sclerosis/calcification is present, without any evidence of aortic stenosis.  6. The inferior vena cava is normal in size with greater than 50% respiratory variability, suggesting right atrial pressure of 3 mmHg. FINDINGS  Left Ventricle: Left ventricular ejection fraction, by estimation, is 60 to 65%. The left ventricle has normal function. The left ventricle has no regional wall motion abnormalities. The left ventricular internal cavity size was normal in size. There is  mild asymmetric  left ventricular hypertrophy of the basal-septal segment. Left ventricular diastolic parameters are consistent with Grade I diastolic dysfunction (impaired relaxation). Right Ventricle: The right ventricular size is normal. No increase in right ventricular wall thickness. Right ventricular systolic function is normal. There is normal pulmonary artery systolic pressure. The tricuspid regurgitant velocity is 2.45 m/s, and  with an assumed right atrial pressure of 3 mmHg, the estimated right ventricular systolic pressure is 27.0 mmHg. Left Atrium: Left atrial size was mild to moderately dilated. Right Atrium: Right atrial size was normal in size. Pericardium: There is no evidence of pericardial effusion. Mitral Valve: The mitral valve is normal in structure. Trivial mitral valve regurgitation. No evidence of mitral valve stenosis. Tricuspid Valve: The tricuspid valve is normal in structure. Tricuspid valve regurgitation is mild . No evidence of tricuspid stenosis. Aortic Valve: The aortic valve is  tricuspid. There is mild calcification of the aortic valve. Aortic valve regurgitation is mild. Aortic valve sclerosis/calcification is present, without any evidence of aortic stenosis. Pulmonic Valve: The pulmonic valve was normal in structure. Pulmonic valve regurgitation is trivial. No evidence of pulmonic stenosis. Aorta: The aortic root is normal in size and structure. Venous: The inferior vena cava is normal in size with greater than 50% respiratory variability, suggesting right atrial pressure of 3 mmHg. IAS/Shunts: No atrial level shunt detected by color flow Doppler.  LEFT VENTRICLE PLAX 2D LVIDd:         3.50 cm   Diastology LVIDs:         2.00 cm   LV e' medial:    6.42 cm/s LV PW:         0.70 cm   LV E/e' medial:  9.8 LV IVS:        0.90 cm   LV e' lateral:   8.70 cm/s LVOT diam:     1.80 cm   LV E/e' lateral: 7.2 LV SV:         69 LV SV Index:   41 LVOT Area:     2.54 cm  RIGHT VENTRICLE             IVC RV Basal diam:  3.10 cm     IVC diam: 1.20 cm RV S prime:     10.10 cm/s TAPSE (M-mode): 1.5 cm LEFT ATRIUM             Index        RIGHT ATRIUM           Index LA diam:        3.00 cm 1.80 cm/m   RA Area:     10.50 cm LA Vol (A2C):   33.5 ml 20.15 ml/m  RA Volume:   21.50 ml  12.93 ml/m LA Vol (A4C):   35.0 ml 21.05 ml/m LA Biplane Vol: 35.1 ml 21.11 ml/m  AORTIC VALVE             PULMONIC VALVE LVOT Vmax:   125.00 cm/s PR End Diast Vel: 2.93 msec LVOT Vmean:  79.700 cm/s LVOT VTI:    0.271 m  AORTA Ao Root diam: 2.70 cm Ao Asc diam:  3.10 cm MITRAL VALVE                TRICUSPID VALVE MV Area (PHT): 3.53 cm     TR Peak grad:   24.0 mmHg MV Decel Time: 215 msec     TR Vmax:        245.00 cm/s MV E velocity:  62.90 cm/s MV A velocity: 107.00 cm/s  SHUNTS MV E/A ratio:  0.59         Systemic VTI:  0.27 m                             Systemic Diam: 1.80 cm Arvilla Meres MD Electronically signed by Arvilla Meres MD Signature Date/Time: 03/25/2023/11:21:31 AM    Final     Vitals:   03/25/23  2038 03/25/23 2333 03/26/23 0442 03/26/23 0758  BP: 120/74 121/63 (!) 115/58 (!) 141/56  Pulse: 65 (!) 58 (!) 53 63  Resp: 16   17  Temp: 98.4 F (36.9 C) 98.3 F (36.8 C) 98.3 F (36.8 C) 98.3 F (36.8 C)  TempSrc: Oral Oral Oral Oral  SpO2: 100% 100% 100% 100%  Weight:      Height:         PHYSICAL EXAM General:  Alert, well-nourished, well-developed pleasnt elderly african [place] lady in no acute distress Psych:  Mood and affect appropriate for situation CV: Regular rate and rhythm on monitor Respiratory:  Regular, unlabored respirations on room air GI: Abdomen soft and nontender   NEURO:  Mental Status: AA&Ox3, patient is able to give clear and coherent history Speech/Language: speech is without dysarthria or aphasia.  Naming, repetition, fluency, and comprehension intact.  Cranial Nerves:  II: PERRL. Visual fields full.  III, IV, VI: EOMI. Eyelids elevate symmetrically.  V: Sensation is intact to light touch and symmetrical to face.  VII: Face is symmetrical resting and smiling VIII: hearing intact to voice. IX, X: Palate elevates symmetrically. Phonation is normal.  UU:[id number] shrug 5/5. XII: tongue is midline without fasciculations. Motor: Decreased R hand strength, grip, finger strength (3/5) but intact shoulder and elbow strength. RLE 4/5 strength Tone: is normal and bulk is normal Sensation- Decreased to RLE Coordination: FTN intact bilaterally, ataxia in [place] but not out of proportion to weakness.  Gait- deferred  NIHSS:  1a Level of Conscious.: 0 1b LOC Questions: 0 1c LOC Commands: 0 2 Best Gaze: 0 3 Visual: 0 4 Facial Palsy: 0 5a Motor Arm - left: 0 5b Motor Arm - Right:1 6a Motor Leg - Left: 0 6b Motor Leg - Right: 0 7 Limb Ataxia: 0 8 Sensory: 1 9 Best Language: 0 10 Dysarthria: 0 11 Extinct. and Inatten.: 0 TOTAL: 2   ASSESSMENT/PLAN  Acute Ischemic Infarct:  left parietal and frontal lobes Etiology:  chronic small vessel disease  versus cardio-embolic, possibly due to Afib, pending full work-up  Code Stroke CT head: no acute hemorrhage. Small focus of hypodensity in left frontal lobe.   MRI: Scattered acute to subacute patchy ischemic infarcts involving cortical and subcortical aspect of left frontal and parietal lobes. No hemorrhage.  MRI C spine: No acute abnormality.   2D Echo: LVEF 60-65%, grade 1 diastolic dysfunction, moderately dilated left atria, trivial MVR Recommend 30 day heart monitor on discharge CTA no LVO severe left M1 athesclerosis.  LDL 131 HgbA1c 6.0  VTE prophylaxis - heparin SQ No antithrombotic prior to admission, now on aspirin 81 mg daily and clopidogrel 75 mg daily for 3 months due to intracranial athesclerosis and then aspirin alone.  Therapy recommendations:  CIR Disposition:  pending  Hypertension Home meds:  none Blood Pressure Goal: BP less than 220/110 Permissive hypertension for 24-48 hours from admission, then gradually normalize. Avoid hypotension.   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 Tobacco Abuse Former smoke   Hospital day # 2  90 day DAPT therapy.  Add Zoloft for postop depression may need it for 6-12 months. neurology will sign off. F/u in stroke clinic with Dr. Pearlean Brownie.   MDM: Moderate: Pertinent labs, imaging results reviewed by me and considered in my decision making. Independently reviewed imaging. Medical records reviewed. Discussed the patient with another medical provider/personnel. Obtained history from someone other than the patient.      Javontay Vandam,MD      To contact Stroke Continuity provider, please refer to [email or web address]. After hours, contact General Neurology

## 2023-03-26 NOTE — Progress Notes (Signed)
Physical Therapy Treatment Patient Details Name: [name] MRN: [id number] DOB: 03-07-1931 Today's Date: 03/26/2023   History of Present Illness Pt is 89 yo female presenting 7/18 with persistent RUE weakness with associated tingling and numbness of her RLE as well with possible R facial paresthesias. 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 PMH: GERD, HLD, DJD with radiculopathy.    PT Comments  Tolerated treatment well. Performed LE exercises and encouraged to perform between therapy sessions. Hx of advanced Rt knee arthritis with valgus deformity. Educated on use of wide base quad cane today to ambulate in room. Initially with min assist for sequencing, progressed towards min guard later in distance but still needed cues intermittently for technique and sequencing. No overt buckling but moderately unsteady. Family in room, very  supportive. Min assist for RLE into bed, and min assist to safely rise from seated position with cues for technique (limited flexion from Rt knee.) Patient will continue to benefit from skilled physical therapy services to further improve independence with functional mobility.     Assistance Recommended at Discharge Frequent or constant Supervision/Assistance  If plan is discharge home, recommend the following:  Can travel by private vehicle    A little help with walking and/or transfers;Assist for transportation;Help with stairs or ramp for entrance;Assistance with cooking/housework      Equipment Recommendations  Other (comment) (Possibly hemiwalker v quad cane; Defer to post acute)    Recommendations for Other Services       Precautions / Restrictions Precautions Precautions: Fall;Other (comment) Precaution Comments: sling for OOB activity as needed for RUE Restrictions Weight Bearing Restrictions: No     Mobility  Bed Mobility Overal bed mobility: Needs Assistance Bed Mobility: Sit to Supine       Sit to supine: Min assist    General bed mobility comments: Min assist for RLE back into bed, cues for technique.    Transfers Overall transfer level: Needs assistance Equipment used: Quad cane Transfers: Sit to/from Stand Sit to Stand: Min assist   Step pivot transfers: Min assist       General transfer comment: Min assist for boost to stand from recliner, cues for technique. Stable once upright with LUE on quad cane for support. Limited Rt knee flexion (hx of OA).    Ambulation/Gait Ambulation/Gait assistance: Min Chemical engineer (Feet): 24 Feet Assistive device: Quad cane Gait Pattern/deviations: Decreased step length - left, Narrow base of support, Step-to pattern, Decreased stance time - right, Decreased stride length, Decreased dorsiflexion - right, Decreased weight shift to right, Antalgic Gait velocity: slow Gait velocity interpretation: <1.31 ft/sec, indicative of household ambulator   General Gait Details: Educated on safe AD use with trial of wide base quad cane today. Required heavy cues for sequencing but did show good carry over towards end of distance. Rt knee valgus with antalgic pattern (some of these deficits likely present from OA previously) but does show issues with sequencing and weight shift. No overt buckling. Pt prefers hemiwalker but does see benefit of easier mobility in congested areas with quad cane today. Min assist for quad cane placement, progressing towards min guard for safety.   Stairs             Wheelchair Mobility     Tilt Bed    Modified Rankin (Stroke Patients Only) Modified Rankin (Stroke Patients Only) Pre-Morbid Rankin Score: No symptoms Modified Rankin: Moderately severe disability     Balance Overall balance assessment: Needs assistance Sitting-balance support: No upper  extremity supported Sitting balance-Leahy Scale: Good     Standing balance support: Reliant on assistive device for balance, Single extremity supported Standing  balance-Leahy Scale: Poor Standing balance comment: Min guard with single UE supported at sink and with quad cane.                            Cognition Arousal/Alertness: Awake/alert Behavior During Therapy: WFL for tasks assessed/performed Overall Cognitive Status: Within Functional Limits for tasks assessed                                          Exercises General Exercises - Lower Extremity Ankle Circles/Pumps: AROM, Both, 10 reps, Seated Quad Sets: Strengthening, Both, 10 reps, Seated Gluteal Sets: Strengthening, Both, 10 reps, Seated Long Arc Quad: Strengthening, Both, 10 reps, Seated, AROM Hip ABduction/ADduction: Strengthening, Both, 10 reps, Seated Hip Flexion/Marching: Strengthening, Both, 10 reps, Seated    General Comments General comments (skin integrity, edema, etc.): Family present, very supportive.      Pertinent Vitals/Pain Pain Assessment Pain Assessment: No/denies pain    Home Living                          Prior Function            PT Goals (current goals can now be found in the care plan section) Acute Rehab PT Goals Patient Stated Goal: To get strongr and back to independence PT Goal Formulation: With patient/family Time For Goal Achievement: 04/08/23 Potential to Achieve Goals: Good Progress towards PT goals: Progressing toward goals    Frequency    Min 4X/week      PT Plan Current plan remains appropriate    Co-evaluation              AM-PAC PT "6 Clicks" Mobility   Outcome Measure  Help needed turning from your back to your side while in a flat bed without using bedrails?: A Little Help needed moving from lying on your back to sitting on the side of a flat bed without using bedrails?: A Little Help needed moving to and from a bed to a chair (including a wheelchair)?: A Little Help needed standing up from a chair using your arms (e.g., wheelchair or bedside chair)?: A Little Help needed  to walk in hospital room?: A Little Help needed climbing 3-5 steps with a railing? : A Little 6 Click Score: 18    End of Session Equipment Utilized During Treatment: Gait belt Activity Tolerance: Patient tolerated treatment well Patient left: with call bell/phone within reach;with family/visitor present;in bed;with bed alarm set Nurse Communication: Mobility status PT Visit Diagnosis: Unsteadiness on feet (R26.81);Other abnormalities of gait and mobility (R26.89);Muscle weakness (generalized) (M62.81)     Time: 6578-4696 PT Time Calculation (min) (ACUTE ONLY): 22 min  Charges:    $Gait Training: 8-22 mins PT General Charges $$ ACUTE PT VISIT: 1 Visit                     Kathlyn [place], PT, DPT [facility]  Rehabilitation Services Physical Therapist Office: [phone] Website: [email or web address]    Berton Mount 03/26/2023, 4:38 PM

## 2023-03-27 ENCOUNTER — Other Ambulatory Visit: Payer: Self-pay | Admitting: Gastroenterology

## 2023-03-27 DIAGNOSIS — I639 Cerebral infarction, unspecified: Secondary | ICD-10-CM

## 2023-03-27 NOTE — Plan of Care (Signed)

## 2023-03-27 NOTE — Progress Notes (Signed)
PROGRESS NOTE    [name]  WUJ:[id number] DOB: Mar 08, 1931 DOA: 03/24/2023 PCP: Sharon Seller, NP    Brief Narrative:  89 year old from home with history of hypertension and hyperlipidemia presented with right upper extremity weakness and found to have acute left MCA territory stroke.  Waiting for rehab.   Assessment & Plan:   Acute left MCA territory stroke: Clinical findings, sudden onset right upper extremity weakness CT head findings, left frontal lobe hypodense lesion. MRI of the brain, scattered patchy acute to early subacute ischemic infarct left frontal and parietal lobes.  No associated hemorrhage. CT angiogram of the head and neck, advanced atheromatous narrowing of the left M1 segment. 2D echocardiogram, normal ejection fraction.  No intracardiac thrombus. Antiplatelet therapy, none at home.  Aspirin and Plavix for 3 months and then aspirin alone as per recommendation by neurology. LDL 131, started on statin.  Hemoglobin A1c, 6.  Currently no indication to treatment. DVT prophylaxis, Lovenox. 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 Therapy recommendations, CIR.  Waiting.  Essential hypertension: Blood pressure at goal. Hyperlipidemia: On Crestor 10 mg.  Increased to 20 mg.  Medically stable.  Waiting for CIR.   DVT prophylaxis: heparin injection 5,000 Units Start: 03/24/23 2200   Code Status: Full code Family Communication: Daughter at the bedside Disposition Plan: Status is: Inpatient Remains inpatient appropriate because: Waiting for CIR     Consultants:  Neurology  Procedures:  None  Antimicrobials:  None   Subjective: Patient seen and examined.  Denies any complaints.  Still has weakness on the right side but she thinks is slightly getting better.  Daughter at the bedside.  No difficulty swallowing.  Waiting to hear from rehab.  Objective: Vitals:   03/27/23 0015 03/27/23 0412 03/27/23 0754 03/27/23 1210  BP:  114/71 94/70 (!) 104/54 (!) 111/58  Pulse: 61 66 61 72  Resp: 15 14 14 14   Temp: 98.4 F (36.9 C) 98.8 F (37.1 C) 98.5 F (36.9 C) 98 F (36.7 C)  TempSrc: Oral Oral Oral   SpO2: 99% 96% 100% 100%  Weight:      Height:        Intake/Output Summary (Last 24 hours) at 03/27/2023 1322 Last data filed at 03/26/2023 2030 Gross per 24 hour  Intake 320 ml  Output --  Net 320 ml   Filed Weights   03/24/23 1626  Weight: 65.3 kg    Examination:  General exam: Appears calm and comfortable  Respiratory system: No added sounds. Cardiovascular system: S1 & S2 heard, RRR.  Gastrointestinal system: Abdomen is nondistended, soft and nontender. No organomegaly or masses felt. Normal bowel sounds heard. Central nervous system: Alert and oriented.  No obvious cranial nerve deficits.   She does have weakness of the right upper extremity, 3/5.   Right lower extremity 4/5.     Data Reviewed: I have personally reviewed following labs and imaging studies  CBC: Recent Labs  Lab 03/24/23 1240 03/24/23 1753  WBC 7.5 7.0  NEUTROABS 5.1  --   HGB 13.9 13.1  HCT 41.1 39.1  MCV 97.2 96.3  PLT 216 231   Basic Metabolic Panel: Recent Labs  Lab 03/24/23 1240 03/24/23 1753  NA 136  --   K 3.9  --   CL 103  --   CO2 24  --   GLUCOSE 105*  --   BUN 22  --   CREATININE 0.88 0.87  CALCIUM 9.3  --    GFR: Estimated  Creatinine Clearance: 38.1 mL/min (by C-G formula based on SCr of 0.87 mg/dL). Liver Function Tests: Recent Labs  Lab 03/24/23 1240  AST 25  ALT 30  ALKPHOS 55  BILITOT 0.8  PROT 7.3  ALBUMIN 4.0   No results for input(s): "LIPASE", "AMYLASE" in the last 168 hours. No results for input(s): "AMMONIA" in the last 168 hours. Coagulation Profile: Recent Labs  Lab 03/24/23 1240  INR 1.1   Cardiac Enzymes: No results for input(s): "CKTOTAL", "CKMB", "CKMBINDEX", "TROPONINI" in the last 168 hours. BNP (last 3 results) No results for input(s): "PROBNP" in the last  8760 hours. HbA1C: Recent Labs    03/24/23 1753  HGBA1C 6.0*   CBG: No results for input(s): "GLUCAP" in the last 168 hours. Lipid Profile: Recent Labs    03/25/23 0309  CHOL 225*  HDL 77  LDLCALC 131*  TRIG 86  CHOLHDL 2.9   Thyroid Function Tests: No results for input(s): "TSH", "T4TOTAL", "FREET4", "T3FREE", "THYROIDAB" in the last 72 hours. Anemia Panel: No results for input(s): "VITAMINB12", "FOLATE", "FERRITIN", "TIBC", "IRON", "RETICCTPCT" in the last 72 hours. Sepsis Labs: No results for input(s): "PROCALCITON", "LATICACIDVEN" in the last 168 hours.  No results found for this or any previous visit (from the past 240 hour(s)).       Radiology Studies: CT ANGIO HEAD NECK W WO CM  Result Date: 03/26/2023 CLINICAL DATA:  Stroke workup.  Extremity weakness EXAM: CT ANGIOGRAPHY HEAD AND NECK WITH AND WITHOUT CONTRAST TECHNIQUE: Multidetector CT imaging of the head and neck was performed using the standard protocol during bolus administration of intravenous contrast. Multiplanar CT image reconstructions and MIPs were obtained to evaluate the vascular anatomy. Carotid stenosis measurements (when applicable) are obtained utilizing NASCET criteria, using the distal internal carotid diameter as the denominator. RADIATION DOSE REDUCTION: This exam was performed according to the departmental dose-optimization program which includes automated exposure control, adjustment of the mA and/or kV according to patient size and/or use of iterative reconstruction technique. CONTRAST:  50mL OMNIPAQUE IOHEXOL 350 MG/ML SOLN COMPARISON:  Brain and cervical MRI from 2 days ago FINDINGS: CT HEAD FINDINGS Brain: Acute infarcts in the left cerebral hemisphere are subtle compared to prior brain MRI. No evidence of progression or hemorrhage. Age normal brain volume Vascular: No hyperdense vessel or unexpected calcification. Skull: Normal. Negative for fracture or focal lesion. Sinuses/Orbits: No acute  finding. Review of the MIP images confirms the above findings CTA NECK FINDINGS Aortic arch: Atheromatous plaque with 2 vessel branching. Right carotid system: Limited atheromatous changes. Vessels are smoothly contoured and widely patent. Left carotid system: Limited atheromatous change for age. Vessels are smoothly contoured and widely patent Vertebral arteries: No proximal subclavian or vertebral occlusion. Calcified plaque at the left vertebral origin with high-grade stenosis and non visible lumen on coronal reformats. Skeleton: No acute finding Other neck: No acute finding Upper chest: Clear apical lungs. Review of the MIP images confirms the above findings CTA HEAD FINDINGS Anterior circulation: Atheromatous calcification of the carotid siphons with up to 30-40% narrowing at the left anterior genu. Atheromatous irregularity of intracranial branches including severe left M1 stenosis. No major branch occlusion. Posterior circulation: The vertebral and basilar arteries are diffusely patent. Hypoplastic left P1 segment. No branch occlusion, beading, or aneurysm Venous sinuses: Unremarkable for arterial timing Anatomic variants: None significant Review of the MIP images confirms the above findings IMPRESSION: 1. Acute left cerebral infarcts are associated with advanced atheromatous narrowing of the left M1 segment. 2. Mild for age  atherosclerosis in the neck although there is high-grade narrowing at the left vertebral origin. Electronically Signed   By: Tiburcio Pea M.D.   On: 03/26/2023 08:01        Scheduled Meds:  aspirin  81 mg Oral Daily   cholecalciferol  1,000 Units Oral Daily   clopidogrel  75 mg Oral Daily   feeding supplement  237 mL Oral BID BM   heparin  5,000 Units Subcutaneous Q8H   pantoprazole  40 mg Oral Daily   rosuvastatin  20 mg Oral Daily   sertraline  25 mg Oral Daily   sodium chloride flush  3 mL Intravenous Once   Continuous Infusions:   LOS: 3 days    Time spent: 35  minutes    Dorcas Carrow, MD [facility]

## 2023-03-27 NOTE — PMR Pre-admission (Signed)
PMR Admission Coordinator Pre-Admission Assessment  Patient: [name] is an 89 y.o., female MRN: [id number] DOB: October 31, 1930 Height: 5\' 2"  (157.5 cm) Weight: 65.3 kg  Insurance Information HMO: yes    PPO:      PCP:      IPA:      80/20:      OTHER:  PRIMARY: UHC Medicare       Policy#: [id number], Medicare: [id number]      Subscriber:  CM Name:       Phone#: [phone]     Fax#: [phone] Gwendolyn with [facility] caled and approved admit for 1/02-7/25 Pre-Cert#: [id number]      Employer:  Benefits:  Phone #:      Name:  Eff Date: 09/06/2022 - still active Deductible: $0 (does not have deductible) OOP Max: $1,500 ($145 met)  CIR: $200/day co-pay for days 1-8, $0/day co-pay for days 8+ SNF: $20 Copayment per day for days 1-20; $100 Copayment per day for days 21-100 for Medicare-covered care/maximum 100 days/benefit period  Outpatient: $35 copay/visit Home Health:  100% coverage, 0% co-insurance; limited by medical necessity  DME: 80% coverage; 20% co-insurance  Providers: in Museum/gallery exhibitions officer:       Phone#:   The Data processing manager" for patients in Inpatient Rehabilitation Facilities with attached "Privacy Act Statement-Health Care Records" was provided and verbally reviewed with: Patient and Family  Emergency Contact Information Contact Information     Name Relation Home Work Mobile   Middlebrook,Adrianene Daughter [phone]        Other Contacts     Name Relation Home Work Mobile   [place] Daughter [phone]  [phone]   Tanessa, [place] Daughter [phone]     Daughdrill,Christopher Son [phone]         Current Medical History  Patient Admitting Diagnosis: CVA History of Present Illness:[name] is a 89 y.o. female with medical history significant of  GERD, HLD, morbid obesity ,  djd of the spine with chronic neck pain as well as radiculopathy of right upper extremity for which she was evaluated  by neurosurgery due to Spondylolisthesis of cervical spine. Patient of note has interim history of office visit 03/18/23 with complaints of dizziness and right upper extremity more so in right hand few days prior to visit. At that visit she was thought to have exacerbation of her know cervical radiculopathy and was prescribed steroid pack.  Patient it appears since then has had persistent symptoms which acutely got worse hours prior to presentation [facility]. Per family patient was noted to have episode of word finding difficulties two days ago which was transient and resolved on its own. However today hours PTA patient was noted to have persistent right arm weakness now associated with tingling and numbness of her right leg as well as questionable right side facial paresthesias. She was transferred to [facility] 03/25/23 for stroke work up.  In the ED, Pt. Was afebrile, bp 151/81 , hr 74, rr 18   sat 100%. UA neg  Etoh :neg, Na 136, K3.9 cl103, cr 0.88, UDS:neg. CTH with  Small focus of hypodensity within the left frontal lobe white matter, which may reflect small vessel ischemic disease or an acute white matter infarct. Consider a brain MRI for further evaluation and no acute intracranial hemorrhage or acute demarcated cortical infarct. EKG showed sinus rhythm MRI of cervical spine with  multilevel cervical spondylosis with resultant mild to moderate spinal stenosis at C5-6  and C6-7 and multifactorial degenerative changes with resultant multilevel foraminal narrowing.  Notable findings include mild left C3 and right C5 foraminal stenosis, severe right worse than left C6 foraminal narrowing, with moderate left and mild to moderate right C7 foraminal stenosis. MRI of Pt.'s brain showed scattered patchy acute to early subacute ischemic infarcts involving the cortical and subcortical aspect of the left frontal and parietal lobes. No associated hemorrhage or significant regional mass  effect. Pt. Seen by PT/OT and they recommended CIR to assist return to PLOF.   Complete NIHSS TOTAL: 4  Patient's medical record from [facility] has been reviewed by the rehabilitation admission coordinator and physician.  Past Medical History  Past Medical History:  Diagnosis Date   Arthritis    Arthropathy, unspecified, site unspecified    Dr. Noel Gerold...right clavicular head swelling 2003   Colon polyp    colonoscopy 06-24-06 with no adenomatous change, hyperplasia  only   Environmental allergies    GERD (gastroesophageal reflux disease)    Healthcare maintenance    CPX 12-24-09, Td 1/05, pneumovax 2000 age 14, G[name] ([place])   Hyperlipidemia    target less than 160 > try off muscle aches 03-18-10   Morbid obesity (HCC)    ideal = 142.  target wt = 158 for BMI < 30   Osteopenia    BMD 08-07-08 tspin - 1.2, left femur - .4, right femur +.2.  repeat 01-02-10  1.9    0.1   0.5   Rhinitis, chronic    Dr. Linward Foster...on immunotherapy since 5/08    Has the patient had major surgery during 100 days prior to admission? No  Family History   family history includes Colon cancer in her mother; Healthy in her daughter and son.  Current Medications  Current Facility-Administered Medications:    acetaminophen (TYLENOL) tablet 650 mg, 650 mg, Oral, Q4H PRN **OR** acetaminophen (TYLENOL) 160 MG/5ML solution 650 mg, 650 mg, Per Tube, Q4H PRN **OR** acetaminophen (TYLENOL) suppository 650 mg, 650 mg, Rectal, Q4H PRN, Lurline Del, MD   aspirin chewable tablet 81 mg, 81 mg, Oral, Daily, Skip Mayer A, MD, 81 mg at 03/27/23 1042   cholecalciferol (VITAMIN D3) 25 MCG (1000 UNIT) tablet 1,000 Units, 1,000 Units, Oral, Daily, Skip Mayer A, MD, 1,000 Units at 03/27/23 1042   clopidogrel (PLAVIX) tablet 75 mg, 75 mg, Oral, Daily, Skip Mayer A, MD, 75 mg at 03/27/23 1042   feeding supplement (ENSURE ENLIVE / ENSURE PLUS) liquid 237 mL, 237 mL, Oral,  BID BM, Skip Mayer A, MD, 237 mL at 03/27/23 1042   heparin injection 5,000 Units, 5,000 Units, Subcutaneous, Q8H, Skip Mayer A, MD, 5,000 Units at 03/27/23 0521   loratadine (CLARITIN) tablet 10 mg, 10 mg, Oral, Daily PRN, Lurline Del, MD   pantoprazole (PROTONIX) EC tablet 40 mg, 40 mg, Oral, Daily, Skip Mayer A, MD, 40 mg at 03/27/23 1042   polyvinyl alcohol (LIQUIFILM TEARS) 1.4 % ophthalmic solution 1 drop, 1 drop, Both Eyes, PRN, Skip Mayer A, MD   rosuvastatin (CRESTOR) tablet 20 mg, 20 mg, Oral, Daily, Blake Divine, Vijaya, MD, 20 mg at 03/27/23 1042   senna-docusate (Senokot-S) tablet 1 tablet, 1 tablet, Oral, QHS PRN, Lurline Del, MD   sertraline (ZOLOFT) tablet 25 mg, 25 mg, Oral, Daily, Palikh, Gaurang M, MD, 25 mg at 03/27/23 1042   sodium chloride flush (NS) 0.9 % injection 3 mL, 3 mL, Intravenous, Once, Curatolo, Adam, DO  Patients Current Diet:  Diet Order             Diet Heart Room service appropriate? Yes; Fluid consistency: Thin  Diet effective now                   Precautions / Restrictions Precautions Precautions: Fall, Other (comment) Precaution Comments: sling for OOB activity as needed for RUE Restrictions Weight Bearing Restrictions: No   Has the patient had 2 or more falls or a fall with injury in the past year? No  Prior Activity Level Community (5-7x/wk): Pt. active in the community PTA  Prior Functional Level Self Care: Did the patient need help bathing, dressing, using the toilet or eating? Independent  Indoor Mobility: Did the patient need assistance with walking from room to room (with or without device)? Independent  Stairs: Did the patient need assistance with internal or external stairs (with or without device)? Independent  Functional Cognition: Did the patient need help planning regular tasks such as shopping or remembering to take medications? Independent  Patient Information Are you of Hispanic,  Latino/a,or Spanish origin?: A. No, not of Hispanic, Latino/a, or Spanish origin What is your race?: B. Black or African American Do you need or want an interpreter to communicate with a doctor or health care staff?: 0. No  Patient's Response To:  Health Literacy and Transportation Is the patient able to respond to health literacy and transportation needs?: Yes Health Literacy - How often do you need to have someone help you when you read instructions, pamphlets, or other written material from your doctor or pharmacy?: Never In the past 12 months, has lack of transportation kept you from medical appointments or from getting medications?: Yes In the past 12 months, has lack of transportation kept you from meetings, work, or from getting things needed for daily living?: No  Home Assistive Devices / Equipment Home Assistive Devices/Equipment: Medical laboratory scientific officer (specify quad or straight) Home Equipment: Cane - single point, Wheelchair - manual, Agricultural consultant (2 wheels), Toilet riser  Prior Device Use: Indicate devices/aids used by the patient prior to current illness, exacerbation or injury? None of the above  Current Functional Level Cognition  Arousal/Alertness: Awake/alert Overall Cognitive Status: Within Functional Limits for tasks assessed Orientation Level: Oriented X4 Attention: Selective Selective Attention: Appears intact Memory: Impaired Memory Impairment: Retrieval deficit Awareness: Appears intact Problem Solving: Impaired Problem Solving Impairment: Verbal basic    Extremity Assessment (includes Sensation/Coordination)  Upper Extremity Assessment: RUE deficits/detail, LUE deficits/detail RUE Deficits / Details: with gravity minimized, able to make light fist though not able to functionally hold items yet, minimal wrist extension/flexion, impaired elbow flexion/extension - often turning into shoulder movement. shoulder abduction to 90*, shoulder flexion actively to 20* - often turning  into abduction. arthritic changes to digits RUE Coordination: decreased fine motor, decreased gross motor LUE Deficits / Details: functional at all joints, some arthritic changes to hand  Lower Extremity Assessment: Defer to PT evaluation    ADLs  Overall ADL's : Needs assistance/impaired Eating/Feeding: Set up, Sitting Eating/Feeding Details (indicate cue type and reason): assist to cut up food, able to feed with LUE Grooming: Minimal assistance, Standing Upper Body Bathing: Sitting, Moderate assistance Lower Body Bathing: Moderate assistance, Sit to/from stand Upper Body Dressing : Moderate assistance, Sitting Lower Body Dressing: Moderate assistance, Sit to/from stand Toilet Transfer: Minimal assistance, Min guard, Ambulation Toilet Transfer Details (indicate cue type and reason): trial of hemiwalker with initial Min A and cues on placement of device for safe sequencing with progression  to min guard. Min A to stand from regular toilet height Toileting- Clothing Manipulation and Hygiene: Minimal assistance, Sitting/lateral lean, Sit to/from stand Toileting - Clothing Manipulation Details (indicate cue type and reason): able to perform peri care seated with L UE Functional mobility during ADLs: Min guard, Minimal assistance General ADL Comments: Deficits in R dominant UE. Provided multiple handouts for fine motor tasks, AROM, self ROM and general instruction for caregiver ROM    Mobility  Overal bed mobility: Needs Assistance Bed Mobility: Sit to Supine Supine to sit: Min assist Sit to supine: Min assist General bed mobility comments: Min assist for RLE back into bed, cues for technique.    Transfers  Overall transfer level: Needs assistance Equipment used: Quad cane Transfers: Sit to/from Stand Sit to Stand: Min assist Bed to/from chair/wheelchair/BSC transfer type:: Step pivot Step pivot transfers: Min assist General transfer comment: Min assist for boost to stand from recliner,  cues for technique. Stable once upright with LUE on quad cane for support. Limited Rt knee flexion (hx of OA).    Ambulation / Gait / Stairs / Wheelchair Mobility  Ambulation/Gait Ambulation/Gait assistance: Editor, commissioning (Feet): 24 Feet Assistive device: Quad cane Gait Pattern/deviations: Decreased step length - left, Narrow base of support, Step-to pattern, Decreased stance time - right, Decreased stride length, Decreased dorsiflexion - right, Decreased weight shift to right, Antalgic General Gait Details: Educated on safe AD use with trial of wide base quad cane today. Required heavy cues for sequencing but did show good carry over towards end of distance. Rt knee valgus with antalgic pattern (some of these deficits likely present from OA previously) but does show issues with sequencing and weight shift. No overt buckling. Pt prefers hemiwalker but does see benefit of easier mobility in congested areas with quad cane today. Min assist for quad cane placement, progressing towards min guard for safety. Gait velocity: slow Gait velocity interpretation: <1.31 ft/sec, indicative of household ambulator Stairs:  (did not attempt today. Pt has a level entry if she goes in through her deck)    Posture / Balance Balance Overall balance assessment: Needs assistance Sitting-balance support: No upper extremity supported Sitting balance-Leahy Scale: Good Standing balance support: Reliant on assistive device for balance, Single extremity supported Standing balance-Leahy Scale: Poor Standing balance comment: Min guard with single UE supported at sink and with quad cane.    Special needs/care consideration Special service needs none   Previous Home Environment (from acute therapy documentation) Living Arrangements: Children  Lives With: Daughter Available Help at Discharge: Family Type of Home: House Home Layout: Two level Alternate Level Stairs-Number of Steps: full flight Home Access:  Level entry, Stairs to enter [employer] of Steps: 3 Bathroom Shower/Tub: Associate Professor: Yes Home Care Services: No  Discharge Living Setting Plans for Discharge Living Setting: Patient's home Type of Home at Discharge: House Discharge Home Layout: Two level, Able to live on main level with bedroom/bathroom Alternate Level Stairs-Number of Steps: full flight Discharge Home Access: Stairs to enter Entrance Stairs-Rails: Right Entrance Stairs-Number of Steps: 3 Discharge Bathroom Shower/Tub: Tub/shower unit Discharge Bathroom Toilet: Standard Discharge Bathroom Accessibility: Yes How Accessible: Accessible via walker Does the patient have any problems obtaining your medications?: No  Social/Family/Support Systems Patient Roles: Other (Comment) Contact Information: [phone] Anticipated Caregiver: Hansel Starling (daughter) Anticipated Caregiver's Contact Information: [phone] Ability/Limitations of Caregiver: Daughter and other children to rotate to provide 24/7 support at d/c Caregiver Availability: 24/7 Discharge Plan Discussed with  Primary Caregiver: Yes Is Caregiver In Agreement with Plan?: Yes Does Caregiver/Family have Issues with Lodging/Transportation while Pt is in Rehab?: No  Goals Patient/Family Goal for Rehab: PT/OT/SLP Min A Expected length of stay: 7-10 days Pt/Family Agrees to Admission and willing to participate: Yes Program Orientation Provided & Reviewed with Pt/Caregiver Including Roles  & Responsibilities: Yes  Decrease burden of Care through IP rehab admission: not anticipated   Possible need for SNF placement upon discharge: not anticipated  Patient Condition: I have reviewed medical records from [facility], spoken with CM, and patient and daughter. I met with patient at the bedside for inpatient rehabilitation assessment.  Patient will benefit from ongoing PT and OT, can  actively participate in 3 hours of therapy a day 5 days of the week, and can make measurable gains during the admission.  Patient will also benefit from the coordinated team approach during an Inpatient Acute Rehabilitation admission.  The patient will receive intensive therapy as well as Rehabilitation physician, nursing, social worker, and care management interventions.  Due to safety, skin/wound care, disease management, medication administration, pain management, and patient education the patient requires 24 hour a day rehabilitation nursing.  The patient is currently Min A with mobility and basic ADLs.  Discharge setting and therapy post discharge at home with home health is anticipated.  Patient has agreed to participate in the Acute Inpatient Rehabilitation Program and will admit today.  Preadmission Screen Completed By:  Jeronimo Greaves, 03/27/2023 11:40 AM ______________________________________________________________________   Discussed status with Dr. Riley Kill on 03/28/23 at 930 and received approval for admission today.  Admission Coordinator:  Jeronimo Greaves, CCC-SLP, time 1010/Date 03/28/23   Assessment/Plan: Diagnosis: CVA Does the need for close, 24 hr/day Medical supervision in concert with the patient's rehab needs make it unreasonable for this patient to be served in a less intensive setting? Yes Co-Morbidities requiring supervision/potential complications: GERD, morbid obesity. Chronic pain/radic Due to bladder management, bowel management, safety, skin/wound care, disease management, medication administration, pain management, and patient education, does the patient require 24 hr/day rehab nursing? Yes Does the patient require coordinated care of a physician, rehab nurse, PT, OT, and SLP to address physical and functional deficits in the context of the above medical diagnosis(es)? Yes Addressing deficits in the following areas: balance, endurance, locomotion, strength, transferring,  bowel/bladder control, bathing, dressing, feeding, grooming, toileting, cognition, speech, swallowing, and psychosocial support Can the patient actively participate in an intensive therapy program of at least 3 hrs of therapy 5 days a week? Yes The potential for patient to make measurable gains while on inpatient rehab is excellent Anticipated functional outcomes upon discharge from inpatient rehab: min assist PT, min assist OT, min assist SLP Estimated rehab length of stay to reach the above functional goals is: 7-10 days Anticipated discharge destination: Home 10. Overall Rehab/Functional Prognosis: excellent   MD Signature: Ranelle Oyster, MD, [facility] Physical Medicine & Rehabilitation Medical Director Rehabilitation Services 03/28/2023

## 2023-03-28 ENCOUNTER — Other Ambulatory Visit: Payer: Self-pay

## 2023-03-28 ENCOUNTER — Encounter (HOSPITAL_COMMUNITY): Payer: Self-pay | Admitting: Physical Medicine & Rehabilitation

## 2023-03-28 ENCOUNTER — Inpatient Hospital Stay (HOSPITAL_COMMUNITY)
Admission: RE | Admit: 2023-03-28 | Discharge: 2023-04-05 | DRG: 057 | Disposition: A | Discharge status: Home / Self Care | Payer: Medicare Other | Source: Intra-hospital | Attending: Physical Medicine & Rehabilitation | Admitting: Physical Medicine & Rehabilitation

## 2023-03-28 DIAGNOSIS — I951 Orthostatic hypotension: Secondary | ICD-10-CM | POA: Diagnosis present

## 2023-03-28 DIAGNOSIS — R638 Other symptoms and signs concerning food and fluid intake: Secondary | ICD-10-CM | POA: Diagnosis not present

## 2023-03-28 DIAGNOSIS — R531 Weakness: Secondary | ICD-10-CM | POA: Diagnosis present

## 2023-03-28 DIAGNOSIS — R7989 Other specified abnormal findings of blood chemistry: Secondary | ICD-10-CM | POA: Diagnosis present

## 2023-03-28 DIAGNOSIS — Z79899 Other long term (current) drug therapy: Secondary | ICD-10-CM | POA: Diagnosis not present

## 2023-03-28 DIAGNOSIS — F32A Depression, unspecified: Secondary | ICD-10-CM | POA: Diagnosis not present

## 2023-03-28 DIAGNOSIS — I63412 Cerebral infarction due to embolism of left middle cerebral artery: Secondary | ICD-10-CM

## 2023-03-28 DIAGNOSIS — I10 Essential (primary) hypertension: Secondary | ICD-10-CM | POA: Diagnosis present

## 2023-03-28 DIAGNOSIS — Z87891 Personal history of nicotine dependence: Secondary | ICD-10-CM | POA: Diagnosis not present

## 2023-03-28 DIAGNOSIS — I639 Cerebral infarction, unspecified: Secondary | ICD-10-CM | POA: Diagnosis not present

## 2023-03-28 DIAGNOSIS — Z800 Family history of malignant neoplasm of digestive organs: Secondary | ICD-10-CM | POA: Diagnosis not present

## 2023-03-28 DIAGNOSIS — I69398 Other sequelae of cerebral infarction: Secondary | ICD-10-CM | POA: Diagnosis not present

## 2023-03-28 DIAGNOSIS — R7303 Prediabetes: Secondary | ICD-10-CM

## 2023-03-28 DIAGNOSIS — G8929 Other chronic pain: Secondary | ICD-10-CM | POA: Diagnosis present

## 2023-03-28 DIAGNOSIS — K219 Gastro-esophageal reflux disease without esophagitis: Secondary | ICD-10-CM | POA: Diagnosis present

## 2023-03-28 DIAGNOSIS — M1711 Unilateral primary osteoarthritis, right knee: Secondary | ICD-10-CM | POA: Diagnosis present

## 2023-03-28 DIAGNOSIS — I4891 Unspecified atrial fibrillation: Secondary | ICD-10-CM | POA: Diagnosis present

## 2023-03-28 DIAGNOSIS — K5900 Constipation, unspecified: Secondary | ICD-10-CM | POA: Diagnosis present

## 2023-03-28 DIAGNOSIS — E785 Hyperlipidemia, unspecified: Secondary | ICD-10-CM | POA: Diagnosis present

## 2023-03-28 DIAGNOSIS — J309 Allergic rhinitis, unspecified: Secondary | ICD-10-CM | POA: Diagnosis not present

## 2023-03-28 DIAGNOSIS — I6930 Unspecified sequelae of cerebral infarction: Secondary | ICD-10-CM | POA: Diagnosis not present

## 2023-03-28 HISTORY — DX: Cerebral infarction, unspecified: I63.9

## 2023-03-28 MED ORDER — CLOPIDOGREL BISULFATE 75 MG PO TABS: 75.0000 mg | ORAL_TABLET | Freq: Every day | ORAL | Status: DC

## 2023-03-28 MED ORDER — ASPIRIN 81 MG PO CHEW: 81.0000 mg | CHEWABLE_TABLET | Freq: Every day | ORAL | Status: DC

## 2023-03-28 MED ORDER — ROSUVASTATIN CALCIUM 20 MG PO TABS
20.0000 mg | ORAL_TABLET | Freq: Every day | ORAL | Status: DC
  Administered 2023-03-29 – 2023-04-05 (×8): 20 mg via ORAL
  Filled 2023-03-28 (×8): qty 1

## 2023-03-28 MED ORDER — ASPIRIN 81 MG PO CHEW
81.0000 mg | CHEWABLE_TABLET | Freq: Every day | ORAL | Status: DC
  Administered 2023-03-29 – 2023-04-05 (×8): 81 mg via ORAL
  Filled 2023-03-28 (×8): qty 1

## 2023-03-28 MED ORDER — SENNOSIDES-DOCUSATE SODIUM 8.6-50 MG PO TABS: 1.0000 | ORAL_TABLET | Freq: Every evening | ORAL | Status: DC | PRN

## 2023-03-28 MED ORDER — SORBITOL 70 % SOLN: 30.0000 mL | Freq: Every day | Status: DC | PRN

## 2023-03-28 MED ORDER — SERTRALINE HCL 25 MG PO TABS: 25.0000 mg | ORAL_TABLET | Freq: Every day | ORAL | Status: DC

## 2023-03-28 MED ORDER — POLYETHYLENE GLYCOL 3350 17 G PO PACK: 17.0000 g | PACK | Freq: Every day | ORAL | Status: DC | PRN

## 2023-03-28 MED ORDER — ONDANSETRON HCL 4 MG/2ML IJ SOLN: 4.0000 mg | Freq: Four times a day (QID) | INTRAMUSCULAR | Status: DC | PRN

## 2023-03-28 MED ORDER — CLOPIDOGREL BISULFATE 75 MG PO TABS
75.0000 mg | ORAL_TABLET | Freq: Every day | ORAL | Status: DC
  Administered 2023-03-29 – 2023-04-05 (×8): 75 mg via ORAL
  Filled 2023-03-28 (×8): qty 1

## 2023-03-28 MED ORDER — METHOCARBAMOL 500 MG PO TABS: 500.0000 mg | ORAL_TABLET | Freq: Four times a day (QID) | ORAL | Status: DC | PRN

## 2023-03-28 MED ORDER — ALUM & MAG HYDROXIDE-SIMETH 200-200-20 MG/5ML PO SUSP: 30.0000 mL | ORAL | Status: DC | PRN

## 2023-03-28 MED ORDER — PANTOPRAZOLE SODIUM 40 MG PO TBEC
40.0000 mg | DELAYED_RELEASE_TABLET | Freq: Every day | ORAL | Status: DC
  Administered 2023-03-29 – 2023-04-05 (×8): 40 mg via ORAL
  Filled 2023-03-28 (×8): qty 1

## 2023-03-28 MED ORDER — ROSUVASTATIN CALCIUM 20 MG PO TABS: 20.0000 mg | ORAL_TABLET | Freq: Every day | ORAL | Status: DC

## 2023-03-28 MED ORDER — GUAIFENESIN-DM 100-10 MG/5ML PO SYRP: 10.0000 mL | ORAL_SOLUTION | Freq: Four times a day (QID) | ORAL | Status: DC | PRN

## 2023-03-28 MED ORDER — DICLOFENAC SODIUM 1 % EX GEL
2.0000 g | Freq: Four times a day (QID) | CUTANEOUS | Status: DC | PRN
  Administered 2023-03-30 – 2023-04-03 (×2): 4 g via TOPICAL
  Filled 2023-03-28: qty 100

## 2023-03-28 MED ORDER — TRAZODONE HCL 50 MG PO TABS: 50.0000 mg | ORAL_TABLET | Freq: Every evening | ORAL | Status: DC | PRN

## 2023-03-28 MED ORDER — ENSURE ENLIVE PO LIQD
237.0000 mL | Freq: Two times a day (BID) | ORAL | Status: DC
  Administered 2023-03-31 – 2023-04-05 (×8): 237 mL via ORAL

## 2023-03-28 MED ORDER — FLEET ENEMA 7-19 GM/118ML RE ENEM: 1.0000 | ENEMA | Freq: Once | RECTAL | Status: DC | PRN

## 2023-03-28 MED ORDER — POLYVINYL ALCOHOL 1.4 % OP SOLN: 1.0000 [drp] | OPHTHALMIC | Status: DC | PRN

## 2023-03-28 MED ORDER — HEPARIN SODIUM (PORCINE) 5000 UNIT/ML IJ SOLN
5000.0000 [IU] | Freq: Three times a day (TID) | INTRAMUSCULAR | Status: DC
  Administered 2023-03-28 – 2023-04-05 (×23): 5000 [IU] via SUBCUTANEOUS
  Filled 2023-03-28 (×21): qty 1

## 2023-03-28 MED ORDER — VITAMIN D 25 MCG (1000 UNIT) PO TABS
1000.0000 [IU] | ORAL_TABLET | Freq: Every day | ORAL | Status: DC
  Administered 2023-03-29 – 2023-04-05 (×8): 1000 [IU] via ORAL
  Filled 2023-03-28 (×8): qty 1

## 2023-03-28 MED ORDER — ONDANSETRON HCL 4 MG PO TABS: 4.0000 mg | ORAL_TABLET | Freq: Four times a day (QID) | ORAL | Status: DC | PRN

## 2023-03-28 MED ORDER — LORATADINE 10 MG PO TABS: 10.0000 mg | ORAL_TABLET | Freq: Every day | ORAL | Status: DC | PRN

## 2023-03-28 MED ORDER — SERTRALINE HCL 50 MG PO TABS
25.0000 mg | ORAL_TABLET | Freq: Every day | ORAL | Status: DC
  Administered 2023-03-29 – 2023-03-31 (×3): 25 mg via ORAL
  Filled 2023-03-28 (×3): qty 1

## 2023-03-28 MED ORDER — ACETAMINOPHEN 325 MG PO TABS
325.0000 mg | ORAL_TABLET | ORAL | Status: DC | PRN
  Administered 2023-03-29 – 2023-04-01 (×3): 650 mg via ORAL
  Filled 2023-03-28 (×3): qty 2

## 2023-03-28 MED ORDER — HEPARIN SODIUM (PORCINE) 5000 UNIT/ML IJ SOLN
5000.0000 [IU] | Freq: Three times a day (TID) | INTRAMUSCULAR | Status: DC
  Filled 2023-03-28 (×2): qty 1

## 2023-03-28 NOTE — Progress Notes (Signed)
Inpatient Rehabilitation Admission Medication Review by a Pharmacist  A complete drug regimen review was completed for this patient to identify any potential clinically significant medication issues.  High Risk Drug Classes Is patient taking? Indication by Medication  Antipsychotic No   Anticoagulant Yes SQ heparin - VTE ppx  Antibiotic No   Opioid No   Antiplatelet Yes Aspirin, clopidogrel - stroke ppx  Hypoglycemics/insulin No   Vasoactive Medication No   Chemotherapy No   Other Yes Robitussin DM - PRN cough Methocarbamol - PRN muscle spasms Zofran - PRN nausea/vomiting Trazodone - PRN sleep Vit D - supplement Loratadine - PRN allergies Pantoprazole - GERD ppx Rosuvastatin - HLD Sertraline - mood     Type of Medication Issue Identified Description of Issue Recommendation(s)  Drug Interaction(s) (clinically significant)     Duplicate Therapy     Allergy     No Medication Administration End Date     Incorrect Dose     Additional Drug Therapy Needed     Significant med changes from prior encounter (inform family/care partners about these prior to discharge). Prednisone stopped at discharge Communicate medication changes with patient/family at discharge  Other       Clinically significant medication issues were identified that warrant physician communication and completion of prescribed/recommended actions by midnight of the next day:  No   Pharmacist comments:   Crestor 10 mg > 20 mg  Aspirin and Plavix for 3 months and then aspirin alone as per recommendation by neurology   Time spent performing this drug regimen review (minutes): 20   Thank you for allowing pharmacy to be a part of this patient's care.  Thelma Barge, PharmD Clinical Pharmacist

## 2023-03-28 NOTE — Progress Notes (Signed)
PROGRESS NOTE    [name]  WUJ:[id number] DOB: 1930-11-11 DOA: 03/24/2023 PCP: Sharon Seller, NP    Brief Narrative:  89 year old from home with history of hypertension and hyperlipidemia presented with right upper extremity weakness and found to have acute left MCA territory stroke.  Waiting for rehab.   Assessment & Plan:   Acute left MCA territory stroke: Clinical findings, sudden onset right upper extremity weakness CT head findings, left frontal lobe hypodense lesion. MRI of the brain, scattered patchy acute to early subacute ischemic infarct left frontal and parietal lobes.  No associated hemorrhage. CT angiogram of the head and neck, advanced atheromatous narrowing of the left M1 segment. 2D echocardiogram, normal ejection fraction.  No intracardiac thrombus. Antiplatelet therapy, none at home.  Aspirin and Plavix for 3 months and then aspirin alone as per recommendation by neurology. LDL 131, started on statin.  Hemoglobin A1c, 6.  Currently no indication to treatment. DVT prophylaxis, Lovenox. 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 Therapy recommendations, CIR.  Waiting.  Essential hypertension: Blood pressure at goal. Hyperlipidemia: On Crestor 10 mg.  Increased to 20 mg.  Medically stable.  Waiting for CIR.   DVT prophylaxis: heparin injection 5,000 Units Start: 03/24/23 2200   Code Status: Full code Family Communication: Daughter at the bedside Disposition Plan: Status is: Inpatient Remains inpatient appropriate because: Waiting for CIR     Consultants:  Neurology  Procedures:  None  Antimicrobials:  None   Subjective: Patient seen and examined.  No overnight events.  She was very happy to be able to walk with assist.  Right hand is still difficult to use.  Objective: Vitals:   03/27/23 1939 03/27/23 2356 03/28/23 0346 03/28/23 0723  BP: (!) 113/47 (!) 119/59 (!) 112/47 (!) 108/55  Pulse: 64 65 61 (!) 59  Resp:  16 16 16 16   Temp: 98.7 F (37.1 C) 98.3 F (36.8 C) 98 F (36.7 C) 98.3 F (36.8 C)  TempSrc: Oral Oral Oral Oral  SpO2: 100% 100% 97% 99%  Weight:      Height:        Intake/Output Summary (Last 24 hours) at 03/28/2023 0901 Last data filed at 03/27/2023 2035 Gross per 24 hour  Intake 714 ml  Output 350 ml  Net 364 ml   Filed Weights   03/24/23 1626  Weight: 65.3 kg    Examination:  General exam: Appears calm and comfortable, interactive. Respiratory system: No added sounds. Cardiovascular system: S1 & S2 heard, RRR.  Central nervous system: Alert and oriented.  No obvious cranial nerve deficits.   She does have weakness of the right upper extremity, 3/5.   Right lower extremity 4/5.  Left side is normal.   Data Reviewed: I have personally reviewed following labs and imaging studies  CBC: Recent Labs  Lab 03/24/23 1240 03/24/23 1753  WBC 7.5 7.0  NEUTROABS 5.1  --   HGB 13.9 13.1  HCT 41.1 39.1  MCV 97.2 96.3  PLT 216 231   Basic Metabolic Panel: Recent Labs  Lab 03/24/23 1240 03/24/23 1753  NA 136  --   K 3.9  --   CL 103  --   CO2 24  --   GLUCOSE 105*  --   BUN 22  --   CREATININE 0.88 0.87  CALCIUM 9.3  --    GFR: Estimated Creatinine Clearance: 38.1 mL/min (by C-G formula based on SCr of 0.87 mg/dL). Liver Function Tests: Recent Labs  Lab  03/24/23 1240  AST 25  ALT 30  ALKPHOS 55  BILITOT 0.8  PROT 7.3  ALBUMIN 4.0   No results for input(s): "LIPASE", "AMYLASE" in the last 168 hours. No results for input(s): "AMMONIA" in the last 168 hours. Coagulation Profile: Recent Labs  Lab 03/24/23 1240  INR 1.1   Cardiac Enzymes: No results for input(s): "CKTOTAL", "CKMB", "CKMBINDEX", "TROPONINI" in the last 168 hours. BNP (last 3 results) No results for input(s): "PROBNP" in the last 8760 hours. HbA1C: No results for input(s): "HGBA1C" in the last 72 hours.  CBG: No results for input(s): "GLUCAP" in the last 168 hours. Lipid  Profile: No results for input(s): "CHOL", "HDL", "LDLCALC", "TRIG", "CHOLHDL", "LDLDIRECT" in the last 72 hours.  Thyroid Function Tests: No results for input(s): "TSH", "T4TOTAL", "FREET4", "T3FREE", "THYROIDAB" in the last 72 hours. Anemia Panel: No results for input(s): "VITAMINB12", "FOLATE", "FERRITIN", "TIBC", "IRON", "RETICCTPCT" in the last 72 hours. Sepsis Labs: No results for input(s): "PROCALCITON", "LATICACIDVEN" in the last 168 hours.  No results found for this or any previous visit (from the past 240 hour(s)).       Radiology Studies: No results found.      Scheduled Meds:  aspirin  81 mg Oral Daily   cholecalciferol  1,000 Units Oral Daily   clopidogrel  75 mg Oral Daily   feeding supplement  237 mL Oral BID BM   heparin  5,000 Units Subcutaneous Q8H   pantoprazole  40 mg Oral Daily   rosuvastatin  20 mg Oral Daily   sertraline  25 mg Oral Daily   sodium chloride flush  3 mL Intravenous Once   Continuous Infusions:   LOS: 4 days    Time spent: 35 minutes    Dorcas Carrow, MD [facility]

## 2023-03-28 NOTE — Progress Notes (Signed)
PMR Admission Coordinator Pre-Admission Assessment   Patient: [name] is an 89 y.o., female MRN: [id number] DOB: 1931-02-08 Height: 5\' 2"  (157.5 cm) Weight: 65.3 kg   Insurance Information HMO: yes    PPO:      PCP:      IPA:      80/20:      OTHER:  PRIMARY: UHC Medicare       Policy#: [id number], Medicare: [id number]      Subscriber:  CM Name:       Phone#: [phone]     Fax#: [phone] Gwendolyn with [facility] caled and approved admit for 9/52-8/41 Pre-Cert#: [id number]      Employer:  Benefits:  Phone #:      Name:  Eff Date: 09/06/2022 - still active Deductible: $0 (does not have deductible) OOP Max: $1,500 ($145 met)  CIR: $200/day co-pay for days 1-8, $0/day co-pay for days 8+ SNF: $20 Copayment per day for days 1-20; $100 Copayment per day for days 21-100 for Medicare-covered care/maximum 100 days/benefit period  Outpatient: $35 copay/visit Home Health:  100% coverage, 0% co-insurance; limited by medical necessity  DME: 80% coverage; 20% co-insurance  Providers: in Horticulturist, commercial:       Phone#:    The Data processing manager" for patients in Inpatient Rehabilitation Facilities with attached "Privacy Act Statement-Health Care Records" was provided and verbally reviewed with: Patient and Family   Emergency Contact Information Contact Information       Name Relation Home Work Mobile    Middlebrook,Adrianene Daughter [phone]             Other Contacts       Name Relation Home Work Mobile    [place] Daughter [phone]   [phone]    Viva, Gallaher Daughter [phone]        Ringstad,Christopher Son [phone]               Current Medical History  Patient Admitting Diagnosis: CVA History of Present Illness:[name] is a 89 y.o. female with medical history significant of  GERD, HLD, morbid obesity ,  djd of the spine with chronic neck pain as well as radiculopathy of right upper extremity  for which she was evaluated by neurosurgery due to Spondylolisthesis of cervical spine. Patient of note has interim history of office visit 03/18/23 with complaints of dizziness and right upper extremity more so in right hand few days prior to visit. At that visit she was thought to have exacerbation of her know cervical radiculopathy and was prescribed steroid pack.  Patient it appears since then has had persistent symptoms which acutely got worse hours prior to presentation [facility]. Per family patient was noted to have episode of word finding difficulties two days ago which was transient and resolved on its own. However today hours PTA patient was noted to have persistent right arm weakness now associated with tingling and numbness of her right leg as well as questionable right side facial paresthesias. She was transferred to [facility] 03/25/23 for stroke work up.  In the ED, Pt. Was afebrile, bp 151/81 , hr 74, rr 18   sat 100%. UA neg  Etoh :neg, Na 136, K3.9 cl103, cr 0.88, UDS:neg. CTH with  Small focus of hypodensity within the left frontal lobe white matter, which may reflect small vessel ischemic disease or an acute white matter infarct. Consider a brain MRI for further evaluation and no acute intracranial hemorrhage  or acute demarcated cortical infarct. EKG showed sinus rhythm MRI of cervical spine with  multilevel cervical spondylosis with resultant mild to moderate spinal stenosis at C5-6 and C6-7 and multifactorial degenerative changes with resultant multilevel foraminal narrowing.  Notable findings include mild left C3 and right C5 foraminal stenosis, severe right worse than left C6 foraminal narrowing, with moderate left and mild to moderate right C7 foraminal stenosis. MRI of Pt.'s brain showed scattered patchy acute to early subacute ischemic infarcts involving the cortical and subcortical aspect of the left frontal and parietal lobes. No associated hemorrhage or  significant regional mass effect. Pt. Seen by PT/OT and they recommended CIR to assist return to PLOF.    Complete NIHSS TOTAL: 4   Patient's medical record from [facility] has been reviewed by the rehabilitation admission coordinator and physician.   Past Medical History      Past Medical History:  Diagnosis Date   Arthritis     Arthropathy, unspecified, site unspecified      Dr. Noel Gerold...right clavicular head swelling 2003   Colon polyp      colonoscopy 06-24-06 with no adenomatous change, hyperplasia  only   Environmental allergies     GERD (gastroesophageal reflux disease)     Healthcare maintenance      CPX 12-24-09, Td 1/05, pneumovax 2000 age 33, [name] ([place])   Hyperlipidemia      target less than 160 > try off muscle aches 03-18-10   Morbid obesity (HCC)      ideal = 142.  target wt = 158 for BMI < 30   Osteopenia      BMD 08-07-08 tspin - 1.2, left femur - .4, right femur +.2.  repeat 01-02-10  1.9    0.1   0.5   Rhinitis, chronic      Dr. Linward Foster...on immunotherapy since 5/08          Has the patient had major surgery during 100 days prior to admission? No   Family History   family history includes Colon cancer in her mother; Healthy in her daughter and son.   Current Medications  Current Medications    Current Facility-Administered Medications:    acetaminophen (TYLENOL) tablet 650 mg, 650 mg, Oral, Q4H PRN **OR** acetaminophen (TYLENOL) 160 MG/5ML solution 650 mg, 650 mg, Per Tube, Q4H PRN **OR** acetaminophen (TYLENOL) suppository 650 mg, 650 mg, Rectal, Q4H PRN, Lurline Del, MD   aspirin chewable tablet 81 mg, 81 mg, Oral, Daily, Skip Mayer A, MD, 81 mg at 03/27/23 1042   cholecalciferol (VITAMIN D3) 25 MCG (1000 UNIT) tablet 1,000 Units, 1,000 Units, Oral, Daily, Skip Mayer A, MD, 1,000 Units at 03/27/23 1042   clopidogrel (PLAVIX) tablet 75 mg, 75 mg, Oral, Daily, Skip Mayer A, MD, 75 mg at 03/27/23  1042   feeding supplement (ENSURE ENLIVE / ENSURE PLUS) liquid 237 mL, 237 mL, Oral, BID BM, Skip Mayer A, MD, 237 mL at 03/27/23 1042   heparin injection 5,000 Units, 5,000 Units, Subcutaneous, Q8H, Skip Mayer A, MD, 5,000 Units at 03/27/23 0521   loratadine (CLARITIN) tablet 10 mg, 10 mg, Oral, Daily PRN, Lurline Del, MD   pantoprazole (PROTONIX) EC tablet 40 mg, 40 mg, Oral, Daily, Skip Mayer A, MD, 40 mg at 03/27/23 1042   polyvinyl alcohol (LIQUIFILM TEARS) 1.4 % ophthalmic solution 1 drop, 1 drop, Both Eyes, PRN, Skip Mayer A, MD   rosuvastatin (CRESTOR) tablet 20 mg, 20 mg, Oral, Daily, Blake Divine, Vijaya,  MD, 20 mg at 03/27/23 1042   senna-docusate (Senokot-S) tablet 1 tablet, 1 tablet, Oral, QHS PRN, Lurline Del, MD   sertraline (ZOLOFT) tablet 25 mg, 25 mg, Oral, Daily, Palikh, Gaurang M, MD, 25 mg at 03/27/23 1042   sodium chloride flush (NS) 0.9 % injection 3 mL, 3 mL, Intravenous, Once, Curatolo, Adam, DO     Patients Current Diet:  Diet Order                  Diet Heart Room service appropriate? Yes; Fluid consistency: Thin  Diet effective now                         Precautions / Restrictions Precautions Precautions: Fall, Other (comment) Precaution Comments: sling for OOB activity as needed for RUE Restrictions Weight Bearing Restrictions: No    Has the patient had 2 or more falls or a fall with injury in the past year? No   Prior Activity Level Community (5-7x/wk): Pt. active in the community PTA   Prior Functional Level Self Care: Did the patient need help bathing, dressing, using the toilet or eating? Independent   Indoor Mobility: Did the patient need assistance with walking from room to room (with or without device)? Independent   Stairs: Did the patient need assistance with internal or external stairs (with or without device)? Independent   Functional Cognition: Did the patient need help planning regular tasks  such as shopping or remembering to take medications? Independent   Patient Information Are you of Hispanic, Latino/a,or Spanish origin?: A. No, not of Hispanic, Latino/a, or Spanish origin What is your race?: B. Black or African American Do you need or want an interpreter to communicate with a doctor or health care staff?: 0. No   Patient's Response To:  Health Literacy and Transportation Is the patient able to respond to health literacy and transportation needs?: Yes Health Literacy - How often do you need to have someone help you when you read instructions, pamphlets, or other written material from your doctor or pharmacy?: Never In the past 12 months, has lack of transportation kept you from medical appointments or from getting medications?: Yes In the past 12 months, has lack of transportation kept you from meetings, work, or from getting things needed for daily living?: No   Home Assistive Devices / Equipment Home Assistive Devices/Equipment: Medical laboratory scientific officer (specify quad or straight) Home Equipment: Cane - single point, Wheelchair - manual, Agricultural consultant (2 wheels), Toilet riser   Prior Device Use: Indicate devices/aids used by the patient prior to current illness, exacerbation or injury? None of the above   Current Functional Level Cognition   Arousal/Alertness: Awake/alert Overall Cognitive Status: Within Functional Limits for tasks assessed Orientation Level: Oriented X4 Attention: Selective Selective Attention: Appears intact Memory: Impaired Memory Impairment: Retrieval deficit Awareness: Appears intact Problem Solving: Impaired Problem Solving Impairment: Verbal basic    Extremity Assessment (includes Sensation/Coordination)   Upper Extremity Assessment: RUE deficits/detail, LUE deficits/detail RUE Deficits / Details: with gravity minimized, able to make light fist though not able to functionally hold items yet, minimal wrist extension/flexion, impaired elbow flexion/extension -  often turning into shoulder movement. shoulder abduction to 90*, shoulder flexion actively to 20* - often turning into abduction. arthritic changes to digits RUE Coordination: decreased fine motor, decreased gross motor LUE Deficits / Details: functional at all joints, some arthritic changes to hand  Lower Extremity Assessment: Defer to PT evaluation     ADLs  Overall ADL's : Needs assistance/impaired Eating/Feeding: Set up, Sitting Eating/Feeding Details (indicate cue type and reason): assist to cut up food, able to feed with LUE Grooming: Minimal assistance, Standing Upper Body Bathing: Sitting, Moderate assistance Lower Body Bathing: Moderate assistance, Sit to/from stand Upper Body Dressing : Moderate assistance, Sitting Lower Body Dressing: Moderate assistance, Sit to/from stand Toilet Transfer: Minimal assistance, Min guard, Ambulation Toilet Transfer Details (indicate cue type and reason): trial of hemiwalker with initial Min A and cues on placement of device for safe sequencing with progression to min guard. Min A to stand from regular toilet height Toileting- Clothing Manipulation and Hygiene: Minimal assistance, Sitting/lateral lean, Sit to/from stand Toileting - Clothing Manipulation Details (indicate cue type and reason): able to perform peri care seated with L UE Functional mobility during ADLs: Min guard, Minimal assistance General ADL Comments: Deficits in R dominant UE. Provided multiple handouts for fine motor tasks, AROM, self ROM and general instruction for caregiver ROM     Mobility   Overal bed mobility: Needs Assistance Bed Mobility: Sit to Supine Supine to sit: Min assist Sit to supine: Min assist General bed mobility comments: Min assist for RLE back into bed, cues for technique.     Transfers   Overall transfer level: Needs assistance Equipment used: Quad cane Transfers: Sit to/from Stand Sit to Stand: Min assist Bed to/from chair/wheelchair/BSC transfer  type:: Step pivot Step pivot transfers: Min assist General transfer comment: Min assist for boost to stand from recliner, cues for technique. Stable once upright with LUE on quad cane for support. Limited Rt knee flexion (hx of OA).     Ambulation / Gait / Stairs / Wheelchair Mobility   Ambulation/Gait Ambulation/Gait assistance: Editor, commissioning (Feet): 24 Feet Assistive device: Quad cane Gait Pattern/deviations: Decreased step length - left, Narrow base of support, Step-to pattern, Decreased stance time - right, Decreased stride length, Decreased dorsiflexion - right, Decreased weight shift to right, Antalgic General Gait Details: Educated on safe AD use with trial of wide base quad cane today. Required heavy cues for sequencing but did show good carry over towards end of distance. Rt knee valgus with antalgic pattern (some of these deficits likely present from OA previously) but does show issues with sequencing and weight shift. No overt buckling. Pt prefers hemiwalker but does see benefit of easier mobility in congested areas with quad cane today. Min assist for quad cane placement, progressing towards min guard for safety. Gait velocity: slow Gait velocity interpretation: <1.31 ft/sec, indicative of household ambulator Stairs:  (did not attempt today. Pt has a level entry if she goes in through her deck)     Posture / Balance Balance Overall balance assessment: Needs assistance Sitting-balance support: No upper extremity supported Sitting balance-Leahy Scale: Good Standing balance support: Reliant on assistive device for balance, Single extremity supported Standing balance-Leahy Scale: Poor Standing balance comment: Min guard with single UE supported at sink and with quad cane.     Special needs/care consideration Special service needs none    Previous Home Environment (from acute therapy documentation) Living Arrangements: Children  Lives With: Daughter Available Help at  Discharge: Family Type of Home: House Home Layout: Two level Alternate Level Stairs-Number of Steps: full flight Home Access: Level entry, Stairs to enter [employer] of Steps: 3 Bathroom Shower/Tub: Associate Professor: Yes Home Care Services: No   Discharge Living Setting Plans for Discharge Living Setting: Patient's home Type of Home at Discharge: [facility] Discharge  Home Layout: Two level, Able to live on main level with bedroom/bathroom Alternate Level Stairs-Number of Steps: full flight Discharge Home Access: Stairs to enter Entrance Stairs-Rails: Right Entrance Stairs-Number of Steps: 3 Discharge Bathroom Shower/Tub: Tub/shower unit Discharge Bathroom Toilet: Standard Discharge Bathroom Accessibility: Yes How Accessible: Accessible via walker Does the patient have any problems obtaining your medications?: No   Social/Family/Support Systems Patient Roles: Other (Comment) Contact Information: [phone] Anticipated Caregiver: Hansel Starling (daughter) Anticipated Caregiver's Contact Information: [phone] Ability/Limitations of Caregiver: Daughter and other children to rotate to provide 24/7 support at d/c Caregiver Availability: 24/7 Discharge Plan Discussed with Primary Caregiver: Yes Is Caregiver In Agreement with Plan?: Yes Does Caregiver/Family have Issues with Lodging/Transportation while Pt is in Rehab?: No   Goals Patient/Family Goal for Rehab: PT/OT/SLP Min A Expected length of stay: 7-10 days Pt/Family Agrees to Admission and willing to participate: Yes Program Orientation Provided & Reviewed with Pt/Caregiver Including Roles  & Responsibilities: Yes   Decrease burden of Care through IP rehab admission: not anticipated    Possible need for SNF placement upon discharge: not anticipated   Patient Condition: I have reviewed medical records from [facility], spoken with CM, and patient and  daughter. I met with patient at the bedside for inpatient rehabilitation assessment.  Patient will benefit from ongoing PT and OT, can actively participate in 3 hours of therapy a day 5 days of the week, and can make measurable gains during the admission.  Patient will also benefit from the coordinated team approach during an Inpatient Acute Rehabilitation admission.  The patient will receive intensive therapy as well as Rehabilitation physician, nursing, social worker, and care management interventions.  Due to safety, skin/wound care, disease management, medication administration, pain management, and patient education the patient requires 24 hour a day rehabilitation nursing.  The patient is currently Min A with mobility and basic ADLs.  Discharge setting and therapy post discharge at home with home health is anticipated.  Patient has agreed to participate in the Acute Inpatient Rehabilitation Program and will admit today.   Preadmission Screen Completed By:  Jeronimo Greaves, 03/27/2023 11:40 AM ______________________________________________________________________   Discussed status with Dr. Riley Kill on 03/28/23 at 930 and received approval for admission today.   Admission Coordinator:  Jeronimo Greaves, CCC-SLP, time 1010/Date 03/28/23    Assessment/Plan: Diagnosis: CVA Does the need for close, 24 hr/day Medical supervision in concert with the patient's rehab needs make it unreasonable for this patient to be served in a less intensive setting? Yes Co-Morbidities requiring supervision/potential complications: GERD, morbid obesity. Chronic pain/radic Due to bladder management, bowel management, safety, skin/wound care, disease management, medication administration, pain management, and patient education, does the patient require 24 hr/day rehab nursing? Yes Does the patient require coordinated care of a physician, rehab nurse, PT, OT, and SLP to address physical and functional deficits in the context of the  above medical diagnosis(es)? Yes Addressing deficits in the following areas: balance, endurance, locomotion, strength, transferring, bowel/bladder control, bathing, dressing, feeding, grooming, toileting, cognition, speech, swallowing, and psychosocial support Can the patient actively participate in an intensive therapy program of at least 3 hrs of therapy 5 days a week? Yes The potential for patient to make measurable gains while on inpatient rehab is excellent Anticipated functional outcomes upon discharge from inpatient rehab: min assist PT, min assist OT, min assist SLP Estimated rehab length of stay to reach the above functional goals is: 7-10 days Anticipated discharge destination: Home 10. Overall Rehab/Functional Prognosis: excellent  MD Signature: Ranelle Oyster, MD, [facility] Physical Medicine & Rehabilitation Medical Director Rehabilitation Services 03/28/2023

## 2023-03-28 NOTE — Plan of Care (Signed)
Problem: Education: Goal: Knowledge of General Education information will improve Description: Including pain rating scale, medication(s)/side effects and non-pharmacologic comfort measures 03/28/2023 0553 by Ethel Rana, RN Outcome: Progressing 03/28/2023 0553 by Ethel Rana, RN Outcome: Progressing   Problem: Health Behavior/Discharge Planning: Goal: Ability to manage health-related needs will improve 03/28/2023 0553 by Ethel Rana, RN Outcome: Progressing 03/28/2023 0553 by Ethel Rana, RN Outcome: Progressing   Problem: Clinical Measurements: Goal: Ability to maintain clinical measurements within normal limits will improve 03/28/2023 0553 by Ethel Rana, RN Outcome: Progressing 03/28/2023 0553 by Ethel Rana, RN Outcome: Progressing Goal: Will remain free from infection 03/28/2023 0553 by Ethel Rana, RN Outcome: Progressing 03/28/2023 0553 by Ethel Rana, RN Outcome: Progressing Goal: Diagnostic test results will improve 03/28/2023 0553 by Ethel Rana, RN Outcome: Progressing 03/28/2023 0553 by Ethel Rana, RN Outcome: Progressing Goal: Respiratory complications will improve 03/28/2023 0553 by Ethel Rana, RN Outcome: Progressing 03/28/2023 0553 by Ethel Rana, RN Outcome: Progressing Goal: Cardiovascular complication will be avoided 03/28/2023 0553 by Ethel Rana, RN Outcome: Progressing 03/28/2023 0553 by Ethel Rana, RN Outcome: Progressing   Problem: Activity: Goal: Risk for activity intolerance will decrease 03/28/2023 0553 by Ethel Rana, RN Outcome: Progressing 03/28/2023 0553 by Ethel Rana, RN Outcome: Progressing   Problem: Nutrition: Goal: Adequate nutrition will be maintained 03/28/2023 0553 by Ethel Rana, RN Outcome: Progressing 03/28/2023 0553 by Ethel Rana, RN Outcome: Progressing   Problem: Coping: Goal: Level of anxiety will decrease 03/28/2023 0553 by Ethel Rana, RN Outcome: Progressing 03/28/2023 0553 by Ethel Rana, RN Outcome: Progressing   Problem: Elimination: Goal: Will not experience complications related to bowel motility 03/28/2023 0553 by Ethel Rana, RN Outcome: Progressing 03/28/2023 0553 by Ethel Rana, RN Outcome: Progressing Goal: Will not experience complications related to urinary retention 03/28/2023 0553 by Ethel Rana, RN Outcome: Progressing 03/28/2023 0553 by Ethel Rana, RN Outcome: Progressing   Problem: Pain Managment: Goal: General experience of comfort will improve 03/28/2023 0553 by Ethel Rana, RN Outcome: Progressing 03/28/2023 0553 by Ethel Rana, RN Outcome: Progressing   Problem: Safety: Goal: Ability to remain free from injury will improve 03/28/2023 0553 by Ethel Rana, RN Outcome: Progressing 03/28/2023 0553 by Ethel Rana, RN Outcome: Progressing   Problem: Skin Integrity: Goal: Risk for impaired skin integrity will decrease 03/28/2023 0553 by Ethel Rana, RN Outcome: Progressing 03/28/2023 0553 by Ethel Rana, RN Outcome: Progressing   Problem: Education: Goal: Knowledge of disease or condition will improve 03/28/2023 0553 by Ethel Rana, RN Outcome: Progressing 03/28/2023 0553 by Ethel Rana, RN Outcome: Progressing Goal: Knowledge of secondary prevention will improve (MUST DOCUMENT ALL) 03/28/2023 0553 by Ethel Rana, RN Outcome: Progressing 03/28/2023 0553 by Ethel Rana, RN Outcome: Progressing Goal: Knowledge of patient specific risk factors will improve Loraine Leriche N/A or DELETE if not current risk factor) 03/28/2023 0553 by Ethel Rana, RN Outcome: Progressing 03/28/2023 0553 by Ethel Rana, RN Outcome: Progressing   Problem: Ischemic Stroke/TIA Tissue Perfusion: Goal: Complications of ischemic stroke/TIA will be minimized 03/28/2023 0553 by Ethel Rana, RN Outcome: Progressing 03/28/2023 0553 by  Ethel Rana, RN Outcome: Progressing   Problem: Coping: Goal: Will verbalize positive feelings about self 03/28/2023 0553 by Ethel Rana, RN Outcome: Progressing 03/28/2023 0553 by Ethel Rana, RN Outcome: Progressing Goal: Will identify appropriate support needs 03/28/2023  1610 by Ethel Rana, RN Outcome: Progressing 03/28/2023 0553 by Ethel Rana, RN Outcome: Progressing   Problem: Health Behavior/Discharge Planning: Goal: Ability to manage health-related needs will improve 03/28/2023 0553 by Ethel Rana, RN Outcome: Progressing 03/28/2023 0553 by Ethel Rana, RN Outcome: Progressing Goal: Goals will be collaboratively established with patient/family 03/28/2023 0553 by Ethel Rana, RN Outcome: Progressing 03/28/2023 0553 by Ethel Rana, RN Outcome: Progressing   Problem: Self-Care: Goal: Ability to participate in self-care as condition permits will improve 03/28/2023 0553 by Ethel Rana, RN Outcome: Progressing 03/28/2023 0553 by Ethel Rana, RN Outcome: Progressing Goal: Verbalization of feelings and concerns over difficulty with self-care will improve 03/28/2023 0553 by Ethel Rana, RN Outcome: Progressing 03/28/2023 0553 by Ethel Rana, RN Outcome: Progressing Goal: Ability to communicate needs accurately will improve 03/28/2023 0553 by Ethel Rana, RN Outcome: Progressing 03/28/2023 0553 by Ethel Rana, RN Outcome: Progressing   Problem: Nutrition: Goal: Risk of aspiration will decrease 03/28/2023 0553 by Ethel Rana, RN Outcome: Progressing 03/28/2023 0553 by Ethel Rana, RN Outcome: Progressing Goal: Dietary intake will improve 03/28/2023 0553 by Ethel Rana, RN Outcome: Progressing 03/28/2023 0553 by Ethel Rana, RN Outcome: Progressing

## 2023-03-28 NOTE — Progress Notes (Signed)
Physical Therapy Treatment Patient Details Name: [name] MRN: [id number] DOB: 06-Dec-1930 Today's Date: 03/28/2023   History of Present Illness Pt is 89 yo female presenting 7/18 with persistent RUE weakness with associated tingling and numbness of her RLE as well with possible R facial paresthesias. 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 PMH: GERD, HLD, DJD with radiculopathy.    PT Comments  Pt tolerated mobility well, emphasis on gait training with quad cane, transitions in tight spaces and use of the R UE/hand during transfers and hygiene tasks.     Assistance Recommended at Discharge Frequent or constant Supervision/Assistance  If plan is discharge home, recommend the following:  Can travel by private vehicle    A little help with walking and/or transfers;Assist for transportation;Help with stairs or ramp for entrance;Assistance with cooking/housework      Equipment Recommendations  Other (comment) (quad cane/cane)    Recommendations for Other Services Rehab consult     Precautions / Restrictions Precautions Precautions: Fall     Mobility  Bed Mobility Overal bed mobility: Needs Assistance Bed Mobility: Supine to Sit     Supine to sit: Min assist          Transfers Overall transfer level: Needs assistance   Transfers: Sit to/from Stand Sit to Stand: Min assist           General transfer comment: min assist to normalize movement and assist of R UE    Ambulation/Gait Ambulation/Gait assistance: Min assist Gait Distance (Feet): 15 Feet (then 45 feet with quad cane) Assistive device: Quad cane Gait Pattern/deviations: Decreased step length - left, Narrow base of support, Step-to pattern, Decreased stance time - right, Decreased stride length, Decreased dorsiflexion - right, Decreased weight shift to right, Antalgic Gait velocity: slow Gait velocity interpretation: <1.31 ft/sec, indicative of household ambulator   General Gait Details:  cuing for better use of the quad cane.  Good sequencing with step to pattern.  Short, weak heel/toe gait   Stairs             Wheelchair Mobility     Tilt Bed    Modified Rankin (Stroke Patients Only) Modified Rankin (Stroke Patients Only) Pre-Morbid Rankin Score: No symptoms Modified Rankin: Moderately severe disability     Balance Overall balance assessment: Needs assistance Sitting-balance support: No upper extremity supported Sitting balance-Leahy Scale: Good     Standing balance support: Reliant on assistive device for balance, Single extremity supported Standing balance-Leahy Scale: Poor Standing balance comment: Min guard with single UE supported at sink and with quad cane.                            Cognition Arousal/Alertness: Awake/alert Behavior During Therapy: WFL for tasks assessed/performed Overall Cognitive Status: Within Functional Limits for tasks assessed                                          Exercises      General Comments General comments (skin integrity, edema, etc.): pt's family present and supportive      Pertinent Vitals/Pain Pain Assessment Pain Assessment: Faces Faces Pain Scale: Hurts a little bit Pain Descriptors / Indicators: Heaviness, Sore, Shooting, Operative site guarding (arthritic kneees) Pain Intervention(s): Monitored during session    Home Living  Prior Function            PT Goals (current goals can now be found in the care plan section) Acute Rehab PT Goals PT Goal Formulation: With patient/family Time For Goal Achievement: 04/08/23 Potential to Achieve Goals: Good Progress towards PT goals: Progressing toward goals    Frequency    Min 4X/week      PT Plan Current plan remains appropriate    Co-evaluation              AM-PAC PT "6 Clicks" Mobility   Outcome Measure  Help needed turning from your back to your side while in a  flat bed without using bedrails?: A Little Help needed moving from lying on your back to sitting on the side of a flat bed without using bedrails?: A Little Help needed moving to and from a bed to a chair (including a wheelchair)?: A Little Help needed standing up from a chair using your arms (e.g., wheelchair or bedside chair)?: A Little Help needed to walk in hospital room?: A Little Help needed climbing 3-5 steps with a railing? : A Little 6 Click Score: 18    End of Session Equipment Utilized During Treatment: Gait belt Activity Tolerance: Patient tolerated treatment well Patient left: with call bell/phone within reach;with family/visitor present;in bed;with bed alarm set Nurse Communication: Mobility status PT Visit Diagnosis: Unsteadiness on feet (R26.81);Other abnormalities of gait and mobility (R26.89);Muscle weakness (generalized) (M62.81)     Time: 0272-5366 PT Time Calculation (min) (ACUTE ONLY): 30 min  Charges:    $Gait Training: 8-22 mins $Therapeutic Activity: 8-22 mins PT General Charges $$ ACUTE PT VISIT: 1 Visit                     03/28/2023  Jacinto Halim., PT Acute Rehabilitation Services [phone]  (office)   Eliseo Gum Jazzmon Prindle 03/28/2023, 1:07 PM

## 2023-03-28 NOTE — Progress Notes (Signed)
Speech Language Pathology Treatment: Cognitive-Linquistic  Patient Details Name: [name] MRN: [id number] DOB: March 01, 1931 Today's Date: 03/28/2023 Time: 4742-5956 SLP Time Calculation (min) (ACUTE ONLY): 12 min  Assessment / Plan / Recommendation Clinical Impression  Pt seen for treatment focused on deficits related to memory and problem solving using a pillbox activity. She accurately identified errors in all opportunities given Mod verbal and visual cues. Pt independently referenced instructions intermittently to ensure the activity was completed correctly. Suspect pt's memory may be more greatly affected in activities requiring complex problem solving or delayed recall. Her daughter, Mackenzie Land, confirms that pt is experiencing word-finding difficulties at the conversation level, although this was not observed during today's session. Will continue to f/u as able.    HPI HPI: Pt is a 89 yo female presenting to ED 7/18 with persistent R extremity weakness/numbness. Per H&P note, pt noted dizziness and RUE numbness during office visit 7/12. MRI Brain revealed scattered patchy acute to early subacute ischemic infarcts involving cortical and subcortical aspect of L frontal and parietal lobes as well as mild chronic microvascular ischemic disease. 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  Recommendations may be updated based on patient status, additional functional criteria and insurance authorization.    Recommendations                     Oral care BID   Frequent or constant Supervision/Assistance Cognitive communication deficit (L87.564)     Continue with current plan of care     Gwynneth Aliment, M.A., CF-SLP Speech Language Pathology,  Acute Rehabilitation Services  Secure Chat preferred [phone]   03/28/2023, 2:53 PM

## 2023-03-28 NOTE — Progress Notes (Signed)
Inpatient Rehab Admissions Coordinator:   I have a CIR bed for this Pt. Today. RN may call report to [phone].   Pt. To admit to CIR for an estimated 7-10 days with the plan to d/c home with support from her children.  Megan Salon, MS, CCC-SLP Rehab Admissions Coordinator  [phone] (celll) [phone] (office)

## 2023-03-28 NOTE — H&P (Signed)
Physical Medicine and Rehabilitation Admission H&P   CC: Functional deficits secondary to acute to subacute infarcts left frontal and parietal lobes.   HPI: [name] is a 89 year old female presented to [facility] health emergency department at [facility] on 03/24/2023 complaining of worsening right upper arm weakness.  She had been reporting the symptoms for approximately 2 months per family report. She was recently treated for cervical spondylolisthesis and radiculopathy as well as dizziness.  First son stated 11 hours prior to admission her right arm weakness worsened significantly.  Stroke was activated and she was seen in consultation by Dr. Selina Cooley.  She was out of the window for tenecteplase.  Aspirin and Plavix started.  CTA was not performed as part of her code stroke because exam was not consistent with LVO.  Brain revealed scattered acute to subacute patchy ischemic infarctions evolving cortical and subcortical aspect of the left frontal and parietal lobes.  No hemorrhage noted.  MRI of the C-spine without acute abnormality.  She underwent 2D echo with ejection fraction of approximately 60 to 65%.  LDL 131 and hemoglobin A1c 6.0%.  She was started on heparin subcutaneously for VTE prophylaxis.  Neurology recommends a 30-day heart monitor on discharge.  Her social history is significant for loss of her husband in January of this year after 60+ years of marriage and she is exhibiting significant depression.  Zoloft started.  Neurology plans to continue DAPT for 3 months./OT/SLP evaluations obtained.  He is tolerating regular texture diet.  Currently requiring min assist for bed mobility and transfers, using quite cane for ambulation. 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   Review of Systems  Constitutional: Negative.   HENT: Negative.    Eyes:  Negative.   Respiratory: Negative.    Cardiovascular: Negative.   Gastrointestinal: Negative.   Musculoskeletal:  Positive for joint pain.       Right knee pain  Skin: Negative.   Neurological:  Positive for sensory change, focal weakness and weakness.  Psychiatric/Behavioral: Negative.     Past Medical History:  Diagnosis Date   Arthritis    Arthropathy, unspecified, site unspecified    Dr. Noel Gerold...right clavicular head swelling 2003   Colon polyp    colonoscopy 06-24-06 with no adenomatous change, hyperplasia  only   Environmental allergies    GERD (gastroesophageal reflux disease)    Healthcare maintenance    CPX 12-24-09, Td 1/05, pneumovax 2000 age 89, [name] ([place])   Hyperlipidemia    target less than 160 > try off muscle aches 03-18-10   Morbid obesity (HCC)    ideal = 142.  target wt = 158 for BMI < 30   Osteopenia    BMD 08-07-08 tspin - 1.2, left femur - .4, right femur +.2.  repeat 01-02-10  1.9    0.1   0.5   Rhinitis, chronic    Dr. Linward Foster...on immunotherapy since 5/08   Past Surgical History:  Procedure Laterality Date   BUNIONECTOMY  1982   bilateral   CHOLECYSTECTOMY N/A 07/17/2020   Procedure: LAPAROSCOPIC CHOLECYSTECTOMY;  Surgeon: Axel Filler, MD;  Location: [facility] OR;  Service: General;  Laterality: N/A;   FOOT SURGERY  1970   bilateral 5th toes, bones removed   GANGLION CYST EXCISION  2000   3rd finger right hand   TUBAL LIGATION  1971   Family History  Problem  Relation Age of Onset   Colon cancer Mother        (in computer for recall colonoscopy 10/12)   Healthy Daughter        x3   Healthy Son    Social History:  reports that she quit smoking about 67 years ago. Her smoking use included cigarettes. She started smoking about 72 years ago. She has a 2.5 pack-year smoking history. She has never used smokeless tobacco. She reports that she does not drink alcohol and does not use drugs. Allergies: No Known Allergies Medications Prior  to Admission  Medication Sig Dispense Refill   acetaminophen (TYLENOL) 500 MG tablet Take 500 mg by mouth every 6 (six) hours as needed (pain).      Carboxymethylcellul-Glycerin (LUBRICATING EYE DROPS OP) Place 1 drop into both eyes daily as needed (dry eyes).     cetirizine (ZYRTEC) 10 MG tablet Take 10 mg by mouth at bedtime as needed for allergies (drippy nose, drainage, sneezing).     Cholecalciferol (VITAMIN D3) 25 MCG (1000 UT) capsule Take 1,000 Units by mouth daily.     Cyanocobalamin (B-12 PO) Take 1 tablet by mouth daily.     Ensure (ENSURE) Take 237 mLs by mouth 2 (two) times daily between meals.     fluticasone (FLONASE) 50 MCG/ACT nasal spray Place 1 spray into both nostrils daily as needed for allergies.     hydrocortisone 2.5 % cream Apply 1 application topically 2 (two) times daily as needed (itching).      predniSONE (STERAPRED UNI-PAK 21 TAB) 10 MG (21) TBPK tablet Use as directed (Patient taking differently: Take 10 mg by mouth See admin instructions. Use as directed on dose pack) 21 tablet 0   rosuvastatin (CRESTOR) 10 MG tablet TAKE 1 TABLET BY MOUTH EVERY DAY 90 tablet 0      Home: Home Living Family/patient expects to be discharged to:: Private residence Living Arrangements: Children Available Help at Discharge: Family Type of Home: House Home Access: Level entry, Stairs to enter Secretary/administrator of Steps: 3 Home Layout: Two level Alternate Level Stairs-Number of Steps: full flight Bathroom Shower/Tub: Engineer, manufacturing systems: Standard Bathroom Accessibility: Yes Home Equipment: Medical laboratory scientific officer - single point, Wheelchair - manual, Agricultural consultant (2 wheels), Government social research officer  Lives With: Daughter   Functional History: Prior Function Prior Level of Function : Independent/Modified Independent, Driving Mobility Comments: pt reports she was ambulatory without an AD prior to admission ADLs Comments: Pt was independent with all ADLs, IADLs, driving, going to church,  very active  Functional Status:  Mobility: Bed Mobility Overal bed mobility: Needs Assistance Bed Mobility: Sit to Supine Supine to sit: Min assist Sit to supine: Min assist General bed mobility comments: Min assist for RLE back into bed, cues for technique. Transfers Overall transfer level: Needs assistance Equipment used: Quad cane Transfers: Sit to/from Stand Sit to Stand: Min assist Bed to/from chair/wheelchair/BSC transfer type:: Step pivot Step pivot transfers: Min assist General transfer comment: Min assist for boost to stand from recliner, cues for technique. Stable once upright with LUE on quad cane for support. Limited Rt knee flexion (hx of OA). Ambulation/Gait Ambulation/Gait assistance: Min Chemical engineer (Feet): 24 Feet Assistive device: Quad cane Gait Pattern/deviations: Decreased step length - left, Narrow base of support, Step-to pattern, Decreased stance time - right, Decreased stride length, Decreased dorsiflexion - right, Decreased weight shift to right, Antalgic General Gait Details: Educated on safe AD use with trial of wide base quad cane today. Required  heavy cues for sequencing but did show good carry over towards end of distance. Rt knee valgus with antalgic pattern (some of these deficits likely present from OA previously) but does show issues with sequencing and weight shift. No overt buckling. Pt prefers hemiwalker but does see benefit of easier mobility in congested areas with quad cane today. Min assist for quad cane placement, progressing towards min guard for safety. Gait velocity: slow Gait velocity interpretation: <1.31 ft/sec, indicative of household ambulator Stairs:  (did not attempt today. Pt has a level entry if she goes in through her deck)    ADL: ADL Overall ADL's : Needs assistance/impaired Eating/Feeding: Set up, Sitting Eating/Feeding Details (indicate cue type and reason): assist to cut up food, able to feed with LUE Grooming:  Minimal assistance, Standing Upper Body Bathing: Sitting, Moderate assistance Lower Body Bathing: Moderate assistance, Sit to/from stand Upper Body Dressing : Moderate assistance, Sitting Lower Body Dressing: Moderate assistance, Sit to/from stand Toilet Transfer: Minimal assistance, Min guard, Ambulation Toilet Transfer Details (indicate cue type and reason): trial of hemiwalker with initial Min A and cues on placement of device for safe sequencing with progression to min guard. Min A to stand from regular toilet height Toileting- Clothing Manipulation and Hygiene: Minimal assistance, Sitting/lateral lean, Sit to/from stand Toileting - Clothing Manipulation Details (indicate cue type and reason): able to perform peri care seated with L UE Functional mobility during ADLs: Min guard, Minimal assistance General ADL Comments: Deficits in R dominant UE. Provided multiple handouts for fine motor tasks, AROM, self ROM and general instruction for caregiver ROM  Cognition: Cognition Overall Cognitive Status: Within Functional Limits for tasks assessed Arousal/Alertness: Awake/alert Orientation Level: Oriented X4 Attention: Selective Selective Attention: Appears intact Memory: Impaired Memory Impairment: Retrieval deficit Awareness: Appears intact Problem Solving: Impaired Problem Solving Impairment: Verbal basic Cognition Arousal/Alertness: Awake/alert Behavior During Therapy: WFL for tasks assessed/performed Overall Cognitive Status: Within Functional Limits for tasks assessed  Physical Exam: Blood pressure (!) 108/55, pulse (!) 59, temperature 98.3 F (36.8 C), temperature source Oral, resp. rate 16, height 5\' 2"  (1.575 m), weight 65.3 kg, SpO2 99%. Physical Exam Constitutional:      General: She is not in acute distress. HENT:     Head: Normocephalic and atraumatic.     Right Ear: External ear normal.     Left Ear: External ear normal.     Mouth/Throat:     Mouth: Mucous membranes  are moist.  Eyes:     Pupils: Pupils are equal, round, and reactive to light.  Cardiovascular:     Rate and Rhythm: Regular rhythm. Bradycardia present.     Heart sounds: No murmur heard.    No gallop.  Pulmonary:     Effort: Pulmonary effort is normal. No respiratory distress.     Breath sounds: No wheezing.  Abdominal:     General: Bowel sounds are normal. There is no distension.     Palpations: Abdomen is soft.  Musculoskeletal:        General: No swelling.     Cervical back: Normal range of motion.     Comments: Right shoulder tenderness with IR/ER. RA deformities of both hands/ulnar deviation  Neurological:     Mental Status: She is alert.     Motor: Weakness present.     Comments: Pt alert and oriented. Reasonable insight and awareness. Normal language and speech. Appears to have functional memory. Mild right central 7. MMT: RUE 3- to 3/5 prox to 2+ to 3-/5 distally.  RLE 4/5 prox to distal. LUE and LLE 4+ to 5/5. +PD RUE. Decreased FMC RUE with FTN. Sensation 1+/2 RUE and RLE. No abnl tone. DTR's tr to 1+.   Psychiatric:        Mood and Affect: Mood normal.        Behavior: Behavior normal.     No results found for this or any previous visit (from the past 48 hour(s)). No results found.    Blood pressure (!) 108/55, pulse (!) 59, temperature 98.3 F (36.8 C), temperature source Oral, resp. rate 16, height 5\' 2"  (1.575 m), weight 65.3 kg, SpO2 99%.  Medical Problem List and Plan: 1. Functional deficits secondary to left frontal and parietal lobe infarcts small vessel disease vs cardio-embolic due to A-fib.  -neurology recommends 30-day event monitor---set up with [facility] cardiology as outpt  -patient may  shower  -ELOS/Goals: 7-10 days, min assist to supervision goals  2.  Antithrombotics: -DVT/anticoagulation:  Pharmaceutical: Heparin  -antiplatelet therapy: Aspirin and Plavix for three months and then asa alone  3. Pain Management: Tylenol as needed, Voltaren gel to  right knee as needed (she asks for these before therapy sessions). Also wants k-pad.  4. 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 Neuropsych/cognition: This patient is capable of making decisions on her own behalf.  6. Skin/Wound Care: Routine skin care checks   7. Fluids/Electrolytes/Nutrition: Routine Is and Os and follow-up chemistries  8: Hypertension: monitor TID and prn  -no home meds/avoid hypotension  9: Prediabetes:  A1c = 6.0%; blood glucose in range currently  -carb modified diet  10: Hyperlipidemia: continue statin       Milinda Antis, PA-C 03/28/2023

## 2023-03-28 NOTE — H&P (Addendum)
Physical Medicine and Rehabilitation Admission H&P     CC: Functional deficits secondary to acute to subacute infarcts left frontal and parietal lobes.    HPI: [name] is a 89 year old female presented to [facility] health emergency department at [facility] on 03/24/2023 complaining of worsening right upper arm weakness.  She had been reporting the symptoms for approximately 2 months per family report. She was recently treated for cervical spondylolisthesis and radiculopathy as well as dizziness.  First son stated 11 hours prior to admission her right arm weakness worsened significantly.  Stroke was activated and she was seen in consultation by Dr. Selina Cooley.  She was out of the window for tenecteplase.  Aspirin and Plavix started.  CTA was not performed as part of her code stroke because exam was not consistent with LVO.  Brain revealed scattered acute to subacute patchy ischemic infarctions evolving cortical and subcortical aspect of the left frontal and parietal lobes.  No hemorrhage noted.  MRI of the C-spine without acute abnormality.  She underwent 2D echo with ejection fraction of approximately 60 to 65%.  LDL 131 and hemoglobin A1c 6.0%.  She was started on heparin subcutaneously for VTE prophylaxis.  Neurology recommends a 30-day heart monitor on discharge.  Her social history is significant for loss of her husband in January of this year after 60+ years of marriage and she is exhibiting significant depression.  Zoloft started.  Neurology plans to continue DAPT for 3 months./OT/SLP evaluations obtained.  He is tolerating regular texture diet.  Currently requiring min assist for bed mobility and transfers, using quite cane for ambulation. 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    Review of Systems  Constitutional: Negative.   HENT: Negative.    Eyes:  Negative.   Respiratory: Negative.    Cardiovascular: Negative.   Gastrointestinal: Negative.   Musculoskeletal:  Positive for joint pain.       Right knee pain  Skin: Negative.   Neurological:  Positive for sensory change, focal weakness and weakness.  Psychiatric/Behavioral: Negative.          Past Medical History:  Diagnosis Date   Arthritis     Arthropathy, unspecified, site unspecified      Dr. Noel Gerold...right clavicular head swelling 2003   Colon polyp      colonoscopy 06-24-06 with no adenomatous change, hyperplasia  only   Environmental allergies     GERD (gastroesophageal reflux disease)     Healthcare maintenance      CPX 12-24-09, Td 1/05, pneumovax 2000 age 35, [name] ([place])   Hyperlipidemia      target less than 160 > try off muscle aches 03-18-10   Morbid obesity (HCC)      ideal = 142.  target wt = 158 for BMI < 30   Osteopenia      BMD 08-07-08 tspin - 1.2, left femur - .4, right femur +.2.  repeat 01-02-10  1.9    0.1   0.5   Rhinitis, chronic      Dr. Linward Foster...on immunotherapy since 5/08             Past Surgical History:  Procedure Laterality Date   BUNIONECTOMY   1982    bilateral   CHOLECYSTECTOMY N/A 07/17/2020    Procedure: LAPAROSCOPIC CHOLECYSTECTOMY;  Surgeon: Axel Filler, MD;  Location: [facility] OR;  Service: General;  Laterality:  N/A;   FOOT SURGERY   1970    bilateral 5th toes, bones removed   GANGLION CYST EXCISION   2000    3rd finger right hand   TUBAL LIGATION   1971             Family History  Problem Relation Age of Onset   Colon cancer Mother          (in computer for recall colonoscopy 10/12)   Healthy Daughter          x3   Healthy Son          Social History:  reports that she quit smoking about 67 years ago. Her smoking use included cigarettes. She started smoking about 72 years ago. She has a 2.5 pack-year smoking history. She has never used smokeless tobacco. She reports that she does not drink alcohol and  does not use drugs. Allergies:  Allergies  No Known Allergies         Medications Prior to Admission  Medication Sig Dispense Refill   acetaminophen (TYLENOL) 500 MG tablet Take 500 mg by mouth every 6 (six) hours as needed (pain).        Carboxymethylcellul-Glycerin (LUBRICATING EYE DROPS OP) Place 1 drop into both eyes daily as needed (dry eyes).       cetirizine (ZYRTEC) 10 MG tablet Take 10 mg by mouth at bedtime as needed for allergies (drippy nose, drainage, sneezing).       Cholecalciferol (VITAMIN D3) 25 MCG (1000 UT) capsule Take 1,000 Units by mouth daily.       Cyanocobalamin (B-12 PO) Take 1 tablet by mouth daily.       Ensure (ENSURE) Take 237 mLs by mouth 2 (two) times daily between meals.       fluticasone (FLONASE) 50 MCG/ACT nasal spray Place 1 spray into both nostrils daily as needed for allergies.       hydrocortisone 2.5 % cream Apply 1 application topically 2 (two) times daily as needed (itching).        predniSONE (STERAPRED UNI-PAK 21 TAB) 10 MG (21) TBPK tablet Use as directed (Patient taking differently: Take 10 mg by mouth See admin instructions. Use as directed on dose pack) 21 tablet 0   rosuvastatin (CRESTOR) 10 MG tablet TAKE 1 TABLET BY MOUTH EVERY DAY 90 tablet 0              Home: Home Living Family/patient expects to be discharged to:: Private residence Living Arrangements: Children Available Help at Discharge: Family Type of Home: House Home Access: Level entry, Stairs to enter Secretary/administrator of Steps: 3 Home Layout: Two level Alternate Level Stairs-Number of Steps: full flight Bathroom Shower/Tub: Engineer, manufacturing systems: Standard Bathroom Accessibility: Yes Home Equipment: Medical laboratory scientific officer - single point, Wheelchair - manual, Agricultural consultant (2 wheels), Government social research officer  Lives With: Daughter   Functional History: Prior Function Prior Level of Function : Independent/Modified Independent, Driving Mobility Comments: pt reports she was  ambulatory without an AD prior to admission ADLs Comments: Pt was independent with all ADLs, IADLs, driving, going to church, very active   Functional Status:  Mobility: Bed Mobility Overal bed mobility: Needs Assistance Bed Mobility: Sit to Supine Supine to sit: Min assist Sit to supine: Min assist General bed mobility comments: Min assist for RLE back into bed, cues for technique. Transfers Overall transfer level: Needs assistance Equipment used: Quad cane Transfers: Sit to/from Stand Sit to Stand: Min assist Bed to/from chair/wheelchair/BSC transfer type::  Step pivot Step pivot transfers: Min assist General transfer comment: Min assist for boost to stand from recliner, cues for technique. Stable once upright with LUE on quad cane for support. Limited Rt knee flexion (hx of OA). Ambulation/Gait Ambulation/Gait assistance: Min Chemical engineer (Feet): 24 Feet Assistive device: Quad cane Gait Pattern/deviations: Decreased step length - left, Narrow base of support, Step-to pattern, Decreased stance time - right, Decreased stride length, Decreased dorsiflexion - right, Decreased weight shift to right, Antalgic General Gait Details: Educated on safe AD use with trial of wide base quad cane today. Required heavy cues for sequencing but did show good carry over towards end of distance. Rt knee valgus with antalgic pattern (some of these deficits likely present from OA previously) but does show issues with sequencing and weight shift. No overt buckling. Pt prefers hemiwalker but does see benefit of easier mobility in congested areas with quad cane today. Min assist for quad cane placement, progressing towards min guard for safety. Gait velocity: slow Gait velocity interpretation: <1.31 ft/sec, indicative of household ambulator Stairs:  (did not attempt today. Pt has a level entry if she goes in through her deck)   ADL: ADL Overall ADL's : Needs assistance/impaired Eating/Feeding: Set  up, Sitting Eating/Feeding Details (indicate cue type and reason): assist to cut up food, able to feed with LUE Grooming: Minimal assistance, Standing Upper Body Bathing: Sitting, Moderate assistance Lower Body Bathing: Moderate assistance, Sit to/from stand Upper Body Dressing : Moderate assistance, Sitting Lower Body Dressing: Moderate assistance, Sit to/from stand Toilet Transfer: Minimal assistance, Min guard, Ambulation Toilet Transfer Details (indicate cue type and reason): trial of hemiwalker with initial Min A and cues on placement of device for safe sequencing with progression to min guard. Min A to stand from regular toilet height Toileting- Clothing Manipulation and Hygiene: Minimal assistance, Sitting/lateral lean, Sit to/from stand Toileting - Clothing Manipulation Details (indicate cue type and reason): able to perform peri care seated with L UE Functional mobility during ADLs: Min guard, Minimal assistance General ADL Comments: Deficits in R dominant UE. Provided multiple handouts for fine motor tasks, AROM, self ROM and general instruction for caregiver ROM   Cognition: Cognition Overall Cognitive Status: Within Functional Limits for tasks assessed Arousal/Alertness: Awake/alert Orientation Level: Oriented X4 Attention: Selective Selective Attention: Appears intact Memory: Impaired Memory Impairment: Retrieval deficit Awareness: Appears intact Problem Solving: Impaired Problem Solving Impairment: Verbal basic Cognition Arousal/Alertness: Awake/alert Behavior During Therapy: WFL for tasks assessed/performed Overall Cognitive Status: Within Functional Limits for tasks assessed   Physical Exam: Blood pressure (!) 108/55, pulse (!) 59, temperature 98.3 F (36.8 C), temperature source Oral, resp. rate 16, height 5\' 2"  (1.575 m), weight 65.3 kg, SpO2 99%. Physical Exam Constitutional:      General: She is not in acute distress. HENT:     Head: Normocephalic and  atraumatic.     Right Ear: External ear normal.     Left Ear: External ear normal.     Mouth/Throat:     Mouth: Mucous membranes are moist.  Eyes:     Pupils: Pupils are equal, round, and reactive to light.  Cardiovascular:     Rate and Rhythm: Regular rhythm. Bradycardia present.     Heart sounds: No murmur heard.    No gallop.  Pulmonary:     Effort: Pulmonary effort is normal. No respiratory distress.     Breath sounds: No wheezing.  Abdominal:     General: Bowel sounds are normal. There is no distension.  Palpations: Abdomen is soft.  Musculoskeletal:        General: No swelling.     Cervical back: Normal range of motion.     Comments: Right shoulder tenderness with IR/ER. RA deformities of both hands/ulnar deviation  Neurological:     Mental Status: She is alert.     Motor: Weakness present.     Comments: Pt alert and oriented. Reasonable insight and awareness. Normal language and speech. Appears to have functional memory. Mild right central 7. MMT: RUE 3- to 3/5 prox to 2+ to 3-/5 distally. RLE 4/5 prox to distal. LUE and LLE 4+ to 5/5. +PD RUE. Decreased FMC RUE with FTN. Sensation 1+/2 RUE and RLE. No abnl tone. DTR's tr to 1+.   Psychiatric:        Mood and Affect: Mood normal.        Behavior: Behavior normal.        Lab Results Last 48 Hours  No results found for this or any previous visit (from the past 48 hour(s)).   Imaging Results (Last 48 hours)  No results found.         Blood pressure (!) 108/55, pulse (!) 59, temperature 98.3 F (36.8 C), temperature source Oral, resp. rate 16, height 5\' 2"  (1.575 m), weight 65.3 kg, SpO2 99%.   Medical Problem List and Plan: 1. Functional deficits secondary to left frontal and parietal lobe infarcts small vessel disease vs ?cardio-embolic due to A-fib.             -neurology recommends 30-day event monitor---set up with [facility] cardiology as outpt             -patient may  shower             -ELOS/Goals: 7-10 days,  min assist to supervision goals   2.  Antithrombotics: -DVT/anticoagulation:  Pharmaceutical: Heparin             -antiplatelet therapy: Aspirin and Plavix for three months and then asa alone   3. Pain Management: Tylenol as needed, Voltaren gel to right knee as needed (she asks for these before therapy sessions). Also wants k-pad.   4. 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 Neuropsych/cognition: This patient is capable of making decisions on her own behalf.   6. Skin/Wound Care: Routine skin care checks   7. Fluids/Electrolytes/Nutrition: Routine Is and Os and follow-up chemistries   8: Hypertension: monitor TID and prn             -no home meds/avoid hypotension   9: Prediabetes:  A1c = 6.0%; blood glucose in range currently             -carb modified diet   10: Hyperlipidemia: continue statin        Milinda Antis, PA-C 03/28/2023  I have personally performed a face to face diagnostic evaluation of this patient and formulated the key components of the plan.  Additionally, I have personally reviewed laboratory data, imaging studies, as well as relevant notes and concur with the physician assistant's documentation above.  The patient's status has not changed from the original H&P.  Any changes in documentation from the acute care chart have been noted above.  Ranelle Oyster, MD, [place]

## 2023-03-28 NOTE — Discharge Instructions (Addendum)
Inpatient Rehab Discharge Instructions  [name] Discharge date and time:  04/05/2023  Activities/Precautions/ Functional Status: Activity: no lifting, driving, or strenuous exercise until cleared by MD Diet: diabetic diet Wound Care: none needed Functional status:  ___ No restrictions     ___ Walk up steps independently _x__ 24/7 supervision/assistance   ___ Walk up steps with assistance ___ Intermittent supervision/assistance  ___ Bathe/dress independently ___ Walk with walker     ___ Bathe/dress with assistance ___ Walk Independently    ___ Shower independently ___ Walk with assistance    __x_ Shower with assistance _x__ No alcohol     ___ Return to work/school ________  Special Instructions: No driving, alcohol consumption or tobacco use.    COMMUNITY REFERRALS UPON DISCHARGE:     Outpatient: PT  & OT             Agency:[facility] Phone: [phone]             Appointment Date/Time:WILL CALL DAUGHTER TO SET UP FOLLOW UP APPOINTMENTS  Medical Equipment/Items Ordered:ROLLATOR  & 3 IN 1                                                 Agency/Supplier: [facility]  [phone]    STROKE/TIA DISCHARGE INSTRUCTIONS SMOKING Cigarette smoking nearly doubles your risk of having a stroke & is the single most alterable risk factor  If you smoke or have smoked in the last 12 months, you are advised to quit smoking for your health. Most of the excess cardiovascular risk related to smoking disappears within a year of stopping. Ask you doctor about anti-smoking medications [place] Quit Line: 1-800-QUIT NOW Free Smoking Cessation Classes [phone]  CHOLESTEROL Know your levels; limit fat & cholesterol in your diet  Lipid Panel     Component Value Date/Time   CHOL 225 (H) 03/25/2023 0309   TRIG 86 03/25/2023 0309   TRIG 76 06/22/2006 1144   HDL 77 03/25/2023 0309   CHOLHDL 2.9 03/25/2023 0309   VLDL 17 03/25/2023 0309    LDLCALC 131 (H) 03/25/2023 0309   LDLCALC 101 (H) 11/22/2022 1334     Many patients benefit from treatment even if their cholesterol is at goal. Goal: Total Cholesterol (CHOL) less than 160 Goal:  Triglycerides (TRIG) less than 150 Goal:  HDL greater than 40 Goal:  LDL (LDLCALC) less than 100   BLOOD PRESSURE American Stroke Association blood pressure target is less that 120/80 mm/Hg  Your discharge blood pressure is:  BP: (!) 114/56 Monitor your blood pressure Limit your salt and alcohol intake Many individuals will require more than one medication for high blood pressure  DIABETES (A1c is a blood sugar average for last 3 months) Goal HGBA1c is under 7% (HBGA1c is blood sugar average for last 3 months)  Diabetes: Diagnosis of diabetes:  Your A1c:6.0 %    Lab Results  Component Value Date   HGBA1C 6.0 (H) 03/24/2023    Your HGBA1c can be lowered with medications, healthy diet, and exercise. Check your blood sugar as directed by your physician Call your physician if you experience unexplained or low blood sugars.  PHYSICAL ACTIVITY/REHABILITATION Goal is 30 minutes at least 4 days per week  Activity: Increase activity slowly, Therapies: Physical Therapy: Outpatient and Occupational Therapy: Outpatient Return  to work: n/a Activity decreases your risk of heart attack and stroke and makes your heart stronger.  It helps control your weight and blood pressure; helps you relax and can improve your mood. Participate in a regular exercise program. Talk with your doctor about the best form of exercise for you (dancing, walking, swimming, cycling).  DIET/WEIGHT Goal is to maintain a healthy weight  Your discharge diet is:  Diet Order             Diet regular Room service appropriate? Yes; Fluid consistency: Thin  Diet effective now                  thin liquids Your height is:  Height: 5\' 2"  (157.5 cm) Your current weight is: Weight: 61.5 kg Your Body Mass Index (BMI) is:  BMI  (Calculated): 24.79 Following the type of diet specifically designed for you will help prevent another stroke. Your goal weight range is:   Your goal Body Mass Index (BMI) is 19-24. Healthy food habits can help reduce 3 risk factors for stroke:  High cholesterol, hypertension, and excess weight.  RESOURCES Stroke/Support Group:  Call [phone]   STROKE EDUCATION PROVIDED/REVIEWED AND GIVEN TO PATIENT Stroke warning signs and symptoms How to activate emergency medical system (call 911). Medications prescribed at discharge. Need for follow-up after discharge. Personal risk factors for stroke. Pneumonia vaccine given: No Flu vaccine given: No My questions have been answered, the writing is legible, and I understand these instructions.  I will adhere to these goals & educational materials that have been provided to me after my discharge from the hospital.     My questions have been answered and I understand these instructions. I will adhere to these goals and the provided educational materials after my discharge from the hospital.  Patient/Caregiver Signature _______________________________ Date __________  Clinician Signature _______________________________________ Date __________  Please bring this form and your medication list with you to all your follow-up doctor's appointments.

## 2023-03-28 NOTE — Discharge Summary (Signed)
Physician Discharge Summary  Patient ID: [name] MRN: [id number] DOB/AGE: 1930/10/22 89 y.o.  Admit date: 03/28/2023 Discharge date: 04/05/2023  Discharge Diagnoses:  Principal Problem:   CVA (cerebral vascular accident) [facility]) Active problems: Functional deficits secondary to CVA Orthostatic hypotension Prediabetes Hyperlipidemia Right knee OA Azotemia Constipation Allergic rhinitis  Discharged Condition: {condition:18240}  Significant Diagnostic Studies:  Labs:  Basic Metabolic Panel: Recent Labs  Lab 03/24/23 1240 03/24/23 1753  NA 136  --   K 3.9  --   CL 103  --   CO2 24  --   GLUCOSE 105*  --   BUN 22  --   CREATININE 0.88 0.87  CALCIUM 9.3  --     CBC: Recent Labs  Lab 03/24/23 1240 03/24/23 1753  WBC 7.5 7.0  NEUTROABS 5.1  --   HGB 13.9 13.1  HCT 41.1 39.1  MCV 97.2 96.3  PLT 216 231     Brief HPI:   [name] is a 89 y.o. female presented to [facility] health emergency department at [facility] on 03/24/2023 complaining of worsening right upper arm weakness. She had been reporting the symptoms for approximately 2 months per family report. She was recently treated for cervical spondylolisthesis and radiculopathy as well as dizziness. First son stated 11 hours prior to admission her right arm weakness worsened significantly. Stroke was activated and she was seen in consultation by Dr. Selina Cooley. She was out of the window for tenecteplase. Aspirin and Plavix started. CTA was not performed as part of her code stroke because exam was not consistent with LVO. Brain revealed scattered acute to subacute patchy ischemic infarctions evolving cortical and subcortical aspect of the left frontal and parietal lobes. No hemorrhage noted. MRI of the C-spine without acute abnormality. She underwent 2D echo with ejection fraction of approximately 60 to 65%. LDL 131 and hemoglobin A1c 6.0%. She was started on heparin subcutaneously for VTE prophylaxis.  Neurology recommends a 30-day heart monitor on discharge. Her social history is significant for loss of her husband in January of this year after 60+ years of marriage and she is exhibiting significant depression. Zoloft started. Neurology plans to continue DAPT for 3 months./OT/SLP evaluations obtained. He is tolerating regular texture diet. Currently requiring min assist for bed mobility and transfers, using quite cane for ambulation.    Hospital Course: [name] was admitted to rehab 03/28/2023 for inpatient therapies to consist of PT, ST and OT at least three hours five days a week. Past admission physiatrist, therapy team and rehab RN have worked together to provide customized collaborative inpatient rehab. Wrist cock-up splint applied to right. Patient requested to have Zoloft decreased to 12.5 mg as she was concerned this was causing dizziness. Zyrtec given for allergic rhinitis with improvement. BUN has normalized and serum creatinine is in the range of 1.01-1.06. Serum glucose range is 96-149.    Blood pressures were monitored on TID basis and found to have orthostatic hypotension. TED hose and abdominal binder placed. Encouraged PO fluids.  Rehab course: During patient's stay in rehab weekly team conferences were held to monitor patient's progress, set goals and discuss barriers to discharge. At admission, patient required min with mobility, min A with basic self-care skills.  She has had improvement in activity tolerance, balance, postural control as well as ability to compensate for deficits. She has had improvement in functional use RUE/LUE  and RLE/LLE as well as improvement in awareness    Disposition:  There  are no questions and answers to display.       Diet: carb modified, heart healthy  Special Instructions: No driving, alcohol consumption or tobacco use.  Aspirin and Plavix for three months and then asa alone (started DAPT on 03/24/2023)  Neurology recommends  30-day event monitor---set up with [facility] cardiology as outpt. Request placed on 04/04/2023 via [employer].  Allergies as of 03/28/2023   No Known Allergies   Med Rec must be completed prior to using this [facility]***       Follow-up Information     Sharon Seller, NP Follow up.   Specialty: Geriatric Medicine Why: Call the office in 1-2 days to make arrangements for hospital follow-up appointment. Contact information: [address] [place] [address] [phone]         Fanny Dance, MD Follow up.   Specialty: Physical Medicine and Rehabilitation Why: office will call you to arrange your appt (sent) Contact information: [address] [place] [address] [phone]         [facility] Follow up.   Why: Call the office in 1-2 days to make arrangements for hospital follow-up appointment. Contact information: [address]     [address] [place] [address] [phone]                Signed: Milinda Antis 03/28/2023, 4:29 PM

## 2023-03-28 NOTE — Discharge Summary (Signed)
Physician Discharge Summary  [name] ZOX:[id number] DOB: 11/24/1930 DOA: 03/24/2023  PCP: Sharon Seller, NP  Admit date: 03/24/2023 Discharge date: 03/28/2023  Admitted From: Home Disposition: Acute inpatient rehab  Recommendations for Outpatient Follow-up:  Follow up with PCP in 1-2 weeks Neurology to schedule follow-up  Discharge Condition: Stable CODE STATUS: Full code Diet recommendation: Low-salt diet  Discharge summary: 89 year old from home with history of hypertension and hyperlipidemia presented with right upper extremity weakness and found to have acute left MCA territory stroke.  Clinically stabilizing.  Going to rehab today.    # Acute left MCA territory stroke: Clinical findings, sudden onset right upper extremity weakness CT head findings, left frontal lobe hypodense lesion. MRI of the brain, scattered patchy acute to early subacute ischemic infarct left frontal and parietal lobes.  No associated hemorrhage. CT angiogram of the head and neck, advanced atheromatous narrowing of the left M1 segment. 2D echocardiogram, normal ejection fraction.  No intracardiac thrombus. Antiplatelet therapy, none at home.  Aspirin and Plavix for 3 months and then aspirin alone as per recommendation by neurology. LDL 131, increasing dose of Crestor. Hemoglobin A1c, 6.  Currently no indication to treatment.  Low-carb diet. 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 Therapy recommendations, CIR.   Neurology recommended 30-day event monitoring, staff message sent to [facility] cardiology to schedule for ambulatory event monitoring.   Essential hypertension: Blood pressure at goal.  Hyperlipidemia: On Crestor 10 mg.  Increased to 20 mg.  Medically stable to discharge to rehab.   Discharge Diagnoses:  Principal Problem:   Acute CVA (cerebrovascular accident) [facility]) Active Problems:   Hyperlipidemia   Essential hypertension    Discharge  Instructions  Discharge Instructions     Diet - low sodium heart healthy   Complete by: As directed    Increase activity slowly   Complete by: As directed       Allergies as of 03/28/2023   No Known Allergies      Medication List     STOP taking these medications    hydrocortisone 2.5 % cream   predniSONE 10 MG (21) Tbpk tablet Commonly known as: STERAPRED UNI-PAK 21 TAB       TAKE these medications    acetaminophen 500 MG tablet Commonly known as: TYLENOL Take 500 mg by mouth every 6 (six) hours as needed (pain).   aspirin 81 MG chewable tablet Chew 1 tablet (81 mg total) by mouth daily. Start taking on: March 29, 2023   B-12 PO Take 1 tablet by mouth daily.   cetirizine 10 MG tablet Commonly known as: ZYRTEC Take 10 mg by mouth at bedtime as needed for allergies (drippy nose, drainage, sneezing).   clopidogrel 75 MG tablet Commonly known as: PLAVIX Take 1 tablet (75 mg total) by mouth daily. Start taking on: March 29, 2023   Ensure Take 237 mLs by mouth 2 (two) times daily between meals.   fluticasone 50 MCG/ACT nasal spray Commonly known as: FLONASE Place 1 spray into both nostrils daily as needed for allergies.   LUBRICATING EYE DROPS OP Place 1 drop into both eyes daily as needed (dry eyes).   pantoprazole 40 MG tablet Commonly known as: PROTONIX Take 1 tablet (40 mg total) by mouth daily.   rosuvastatin 20 MG tablet Commonly known as: CRESTOR Take 1 tablet (20 mg total) by mouth daily. Start taking on: March 29, 2023 What changed:  medication strength how much to take   sertraline 25 MG  tablet Commonly known as: ZOLOFT Take 1 tablet (25 mg total) by mouth daily. Start taking on: March 29, 2023   Vitamin D3 1000 units Caps Take 1,000 Units by mouth daily.        No Known Allergies  Consultations: Neurology   Procedures/Studies: CT ANGIO HEAD NECK W WO CM  Result Date: 03/26/2023 CLINICAL DATA:  Stroke workup.  Extremity  weakness EXAM: CT ANGIOGRAPHY HEAD AND NECK WITH AND WITHOUT CONTRAST TECHNIQUE: Multidetector CT imaging of the head and neck was performed using the standard protocol during bolus administration of intravenous contrast. Multiplanar CT image reconstructions and MIPs were obtained to evaluate the vascular anatomy. Carotid stenosis measurements (when applicable) are obtained utilizing NASCET criteria, using the distal internal carotid diameter as the denominator. RADIATION DOSE REDUCTION: This exam was performed according to the departmental dose-optimization program which includes automated exposure control, adjustment of the mA and/or kV according to patient size and/or use of iterative reconstruction technique. CONTRAST:  50mL OMNIPAQUE IOHEXOL 350 MG/ML SOLN COMPARISON:  Brain and cervical MRI from 2 days ago FINDINGS: CT HEAD FINDINGS Brain: Acute infarcts in the left cerebral hemisphere are subtle compared to prior brain MRI. No evidence of progression or hemorrhage. Age normal brain volume Vascular: No hyperdense vessel or unexpected calcification. Skull: Normal. Negative for fracture or focal lesion. Sinuses/Orbits: No acute finding. Review of the MIP images confirms the above findings CTA NECK FINDINGS Aortic arch: Atheromatous plaque with 2 vessel branching. Right carotid system: Limited atheromatous changes. Vessels are smoothly contoured and widely patent. Left carotid system: Limited atheromatous change for age. Vessels are smoothly contoured and widely patent Vertebral arteries: No proximal subclavian or vertebral occlusion. Calcified plaque at the left vertebral origin with high-grade stenosis and non visible lumen on coronal reformats. Skeleton: No acute finding Other neck: No acute finding Upper chest: Clear apical lungs. Review of the MIP images confirms the above findings CTA HEAD FINDINGS Anterior circulation: Atheromatous calcification of the carotid siphons with up to 30-40% narrowing at the  left anterior genu. Atheromatous irregularity of intracranial branches including severe left M1 stenosis. No major branch occlusion. Posterior circulation: The vertebral and basilar arteries are diffusely patent. Hypoplastic left P1 segment. No branch occlusion, beading, or aneurysm Venous sinuses: Unremarkable for arterial timing Anatomic variants: None significant Review of the MIP images confirms the above findings IMPRESSION: 1. Acute left cerebral infarcts are associated with advanced atheromatous narrowing of the left M1 segment. 2. Mild for age atherosclerosis in the neck although there is high-grade narrowing at the left vertebral origin. Electronically Signed   By: Tiburcio Pea M.D.   On: 03/26/2023 08:01   ECHOCARDIOGRAM COMPLETE  Result Date: 03/25/2023    ECHOCARDIOGRAM REPORT   Patient Name:   CHAKIRA JACHIM Date of Exam: 03/25/2023 Medical Rec #:  [id number]        Height:       62.0 in Accession #:    [id number]       Weight:       144.0 lb Date of Birth:  Apr 05, 1931        BSA:          1.662 m Patient Age:    89 years         BP:           139/61 mmHg Patient Gender: F                HR:  62 bpm. Exam Location:  Inpatient Procedure: 2D Echo, Color Doppler and Cardiac Doppler Indications:    stroke  History:        Patient has no prior history of Echocardiogram examinations.                 Risk Factors:Hypertension and Dyslipidemia.  Sonographer:    Delcie Roch RDCS Referring Phys: [id number] SARA-MAIZ A THOMAS IMPRESSIONS  1. Left ventricular ejection fraction, by estimation, is 60 to 65%. The left ventricle has normal function. The left ventricle has no regional wall motion abnormalities. There is mild asymmetric left ventricular hypertrophy of the basal-septal segment. Left ventricular diastolic parameters are consistent with Grade I diastolic dysfunction (impaired relaxation).  2. Right ventricular systolic function is normal. The right ventricular size is normal. There is  normal pulmonary artery systolic pressure.  3. Left atrial size was mild to moderately dilated.  4. The mitral valve is normal in structure. Trivial mitral valve regurgitation. No evidence of mitral stenosis.  5. The aortic valve is tricuspid. There is mild calcification of the aortic valve. Aortic valve regurgitation is mild. Aortic valve sclerosis/calcification is present, without any evidence of aortic stenosis.  6. The inferior vena cava is normal in size with greater than 50% respiratory variability, suggesting right atrial pressure of 3 mmHg. FINDINGS  Left Ventricle: Left ventricular ejection fraction, by estimation, is 60 to 65%. The left ventricle has normal function. The left ventricle has no regional wall motion abnormalities. The left ventricular internal cavity size was normal in size. There is  mild asymmetric left ventricular hypertrophy of the basal-septal segment. Left ventricular diastolic parameters are consistent with Grade I diastolic dysfunction (impaired relaxation). Right Ventricle: The right ventricular size is normal. No increase in right ventricular wall thickness. Right ventricular systolic function is normal. There is normal pulmonary artery systolic pressure. The tricuspid regurgitant velocity is 2.45 m/s, and  with an assumed right atrial pressure of 3 mmHg, the estimated right ventricular systolic pressure is 27.0 mmHg. Left Atrium: Left atrial size was mild to moderately dilated. Right Atrium: Right atrial size was normal in size. Pericardium: There is no evidence of pericardial effusion. Mitral Valve: The mitral valve is normal in structure. Trivial mitral valve regurgitation. No evidence of mitral valve stenosis. Tricuspid Valve: The tricuspid valve is normal in structure. Tricuspid valve regurgitation is mild . No evidence of tricuspid stenosis. Aortic Valve: The aortic valve is tricuspid. There is mild calcification of the aortic valve. Aortic valve regurgitation is mild. Aortic  valve sclerosis/calcification is present, without any evidence of aortic stenosis. Pulmonic Valve: The pulmonic valve was normal in structure. Pulmonic valve regurgitation is trivial. No evidence of pulmonic stenosis. Aorta: The aortic root is normal in size and structure. Venous: The inferior vena cava is normal in size with greater than 50% respiratory variability, suggesting right atrial pressure of 3 mmHg. IAS/Shunts: No atrial level shunt detected by color flow Doppler.  LEFT VENTRICLE PLAX 2D LVIDd:         3.50 cm   Diastology LVIDs:         2.00 cm   LV e' medial:    6.42 cm/s LV PW:         0.70 cm   LV E/e' medial:  9.8 LV IVS:        0.90 cm   LV e' lateral:   8.70 cm/s LVOT diam:     1.80 cm   LV E/e' lateral: 7.2 LV SV:  69 LV SV Index:   41 LVOT Area:     2.54 cm  RIGHT VENTRICLE             IVC RV Basal diam:  3.10 cm     IVC diam: 1.20 cm RV S prime:     10.10 cm/s TAPSE (M-mode): 1.5 cm LEFT ATRIUM             Index        RIGHT ATRIUM           Index LA diam:        3.00 cm 1.80 cm/m   RA Area:     10.50 cm LA Vol (A2C):   33.5 ml 20.15 ml/m  RA Volume:   21.50 ml  12.93 ml/m LA Vol (A4C):   35.0 ml 21.05 ml/m LA Biplane Vol: 35.1 ml 21.11 ml/m  AORTIC VALVE             PULMONIC VALVE LVOT Vmax:   125.00 cm/s PR End Diast Vel: 2.93 msec LVOT Vmean:  79.700 cm/s LVOT VTI:    0.271 m  AORTA Ao Root diam: 2.70 cm Ao Asc diam:  3.10 cm MITRAL VALVE                TRICUSPID VALVE MV Area (PHT): 3.53 cm     TR Peak grad:   24.0 mmHg MV Decel Time: 215 msec     TR Vmax:        245.00 cm/s MV E velocity: 62.90 cm/s MV A velocity: 107.00 cm/s  SHUNTS MV E/A ratio:  0.59         Systemic VTI:  0.27 m                             Systemic Diam: 1.80 cm Arvilla Meres MD Electronically signed by Arvilla Meres MD Signature Date/Time: 03/25/2023/11:21:31 AM    Final    MR Cervical Spine Wo Contrast  Result Date: 03/24/2023 CLINICAL DATA:  Initial evaluation for acute myelopathy. EXAM: MRI  CERVICAL SPINE WITHOUT CONTRAST TECHNIQUE: Multiplanar, multisequence MR imaging of the cervical spine was performed. No intravenous contrast was administered. COMPARISON:  None Available. FINDINGS: Alignment: Reversal of the normal cervical lordosis. Trace degenerative anterolisthesis of C4 on C5 and C7 on T1, with trace retrolisthesis of C5 on C6 and C6 on C7. Findings likely chronic and facet mediated. Vertebrae: Vertebral body height maintained without acute or chronic fracture. Bone marrow signal intensity within normal limits. Few scattered benign hemangiomata noted. No worrisome osseous lesions. No abnormal marrow edema. Cord: Normal signal and morphology. Posterior Fossa, vertebral arteries, paraspinal tissues: Unremarkable. Disc levels: C2-C3: Mild disc bulge with uncovertebral spurring. Moderate left with mild right facet arthrosis. No spinal stenosis. Mild left C3 foraminal narrowing. Right neural foramina remains patent. C3-C4: Disc desiccation with minimal annular disc bulge. Moderate left with mild right facet arthrosis. No canal or foraminal stenosis. C4-C5: Disc desiccation without significant disc bulge. Right greater than left uncovertebral spurring. Right worse than left facet degeneration with associated ankylosis. No spinal stenosis. Mild right C5 foraminal narrowing. Left neural foramina remains patent. C5-C6: Mild degenerative vertebral disc space narrowing. Broad-based right eccentric disc osteophyte complex flattens and partially effaces the ventral thecal sac. Mild cord flattening without cord signal changes. Moderate spinal stenosis. Severe right worse than left C6 foraminal narrowing. C6-C7: Degenerative intervertebral disc space narrowing with diffuse disc osteophyte complex. Broad posterior  component flattens and partially faces the ventral thecal sac. Mild spinal stenosis. Moderate left with mild to moderate right C7 foraminal narrowing. C7-T1: Minimal annular disc bulge. Mild facet  hypertrophy. No significant canal or foraminal stenosis. T1-2: Diffuse disc bulge. Facet and ligament flavum hypertrophy. No significant spinal stenosis. Moderate bilateral foraminal narrowing. IMPRESSION: 1. No acute abnormality within the cervical spine or spinal cord. 2. Multilevel cervical spondylosis with resultant mild to moderate spinal stenosis at C5-6 and C6-7. 3. Multifactorial degenerative changes with resultant multilevel foraminal narrowing as above. Notable findings include mild left C3 and right C5 foraminal stenosis, severe right worse than left C6 foraminal narrowing, with moderate left and mild to moderate right C7 foraminal stenosis. Electronically Signed   By: Rise Mu M.D.   On: 03/24/2023 20:17   MR BRAIN WO CONTRAST  Result Date: 03/24/2023 CLINICAL DATA:  Initial evaluation for neuro deficit, stroke suspected. EXAM: MRI HEAD WITHOUT CONTRAST TECHNIQUE: Multiplanar, multiecho pulse sequences of the brain and surrounding structures were obtained without intravenous contrast. COMPARISON:  Prior CT from earlier the same day. FINDINGS: Brain: Cerebral volume within normal limits. Mild patchy T2/FLAIR hyperintensity noted involving the periventricular and deep white matter both cerebral hemispheres, most characteristic of chronic microvascular ischemic disease, mild for age. Scattered patchy foci of restricted diffusion are seen involving the cortical and subcortical aspect of the left frontal and parietal lobes, consistent with acute to early subacute ischemic infarcts (series 5, image 81). No associated hemorrhage or significant regional mass effect. No other evidence for acute or subacute ischemia. No acute intracranial hemorrhage. Single chronic microhemorrhage noted at the right frontal corona radiata, likely small vessel related. No mass lesion or midline shift. No hydrocephalus or extra-axial fluid collection. Pituitary gland and suprasellar region within normal limits.  Vascular: Major intracranial vascular flow voids are maintained. Skull and upper cervical spine: Craniocervical junction within normal limits. Bone marrow signal intensity normal. No scalp soft tissue abnormality. Sinuses/Orbits: Globes and orbital soft tissues within normal limits. Paranasal sinuses and mastoid air cells are largely clear. Other: None. IMPRESSION: 1. Scattered patchy acute to early subacute ischemic infarcts involving the cortical and subcortical aspect of the left frontal and parietal lobes. No associated hemorrhage or significant regional mass effect. 2. Underlying mild chronic microvascular ischemic disease. Electronically Signed   By: Rise Mu M.D.   On: 03/24/2023 19:51   CT HEAD CODE STROKE WO CONTRAST  Result Date: 03/24/2023 CLINICAL DATA:  Code stroke. Neuro deficit, acute, stroke suspected. Right hand and arm weakness/numbness. EXAM: CT HEAD WITHOUT CONTRAST TECHNIQUE: Contiguous axial images were obtained from the base of the skull through the vertex without intravenous contrast. RADIATION DOSE REDUCTION: This exam was performed according to the departmental dose-optimization program which includes automated exposure control, adjustment of the mA and/or kV according to patient size and/or use of iterative reconstruction technique. COMPARISON:  None. FINDINGS: Brain: No age advanced or lobar predominant parenchymal atrophy. There is a small focus hypodensity within the left frontal lobe centrum semiovale (for instance as seen on series 5, image 33). There is no acute intracranial hemorrhage. No demarcated cortical infarct. No extra-axial fluid collection. No evidence of an intracranial mass. No midline shift. Vascular: No hyperdense vessel.  Atherosclerotic calcifications. Skull: No calvarial fracture or aggressive osseous lesion. Sinuses/Orbits: No mass or acute finding within the imaged orbits. No significant paranasal sinus disease. ASPECTS [facility] Stroke Program Early  CT Score) - Ganglionic level infarction (caudate, lentiform nuclei, internal capsule, insula, M1-M3 cortex): 7 -  Supraganglionic infarction (M4-M6 cortex): 3 Total score (0-10 with 10 being normal): 10 Impression #1 called by telephone at the time of interpretation on 03/24/2023 at 12:38 pm to provider ADAM CURATOLO , who verbally acknowledged these results. IMPRESSION: 1. Small focus of hypodensity within the left frontal lobe white matter, which may reflect small vessel ischemic disease or an acute white matter infarct. Consider a brain MRI for further evaluation. 2. No acute intracranial hemorrhage or acute demarcated cortical infarct. Electronically Signed   By: Jackey Loge D.O.   On: 03/24/2023 12:39   (Echo, Carotid, EGD, Colonoscopy, ERCP)    Subjective: Seen in morning rounds.  No overnight events.  Attempting to use the right hand.  Daughter at the bedside.   Discharge Exam: Vitals:   03/28/23 0346 03/28/23 0723  BP: (!) 112/47 (!) 108/55  Pulse: 61 (!) 59  Resp: 16 16  Temp: 98 F (36.7 C) 98.3 F (36.8 C)  SpO2: 97% 99%   Vitals:   03/27/23 1939 03/27/23 2356 03/28/23 0346 03/28/23 0723  BP: (!) 113/47 (!) 119/59 (!) 112/47 (!) 108/55  Pulse: 64 65 61 (!) 59  Resp: 16 16 16 16   Temp: 98.7 F (37.1 C) 98.3 F (36.8 C) 98 F (36.7 C) 98.3 F (36.8 C)  TempSrc: Oral Oral Oral Oral  SpO2: 100% 100% 97% 99%  Weight:      Height:        General: Pt is alert, awake, not in acute distress Pleasant and interactive.  Eating breakfast. No cranial nerve deficits on gross examination. Right upper extremity power 3/5, weaker on the distal muscle group. Cardiovascular: RRR, S1/S2 +, no rubs, no gallops Respiratory: CTA bilaterally, no wheezing, no rhonchi Abdominal: Soft, NT, ND, bowel sounds + Extremities: no edema, no cyanosis    The results of significant diagnostics from this hospitalization (including imaging, microbiology, ancillary and laboratory) are listed below for  reference.     Microbiology: No results found for this or any previous visit (from the past 240 hour(s)).   Labs: BNP (last 3 results) No results for input(s): "BNP" in the last 8760 hours. Basic Metabolic Panel: Recent Labs  Lab 03/24/23 1240 03/24/23 1753  NA 136  --   K 3.9  --   CL 103  --   CO2 24  --   GLUCOSE 105*  --   BUN 22  --   CREATININE 0.88 0.87  CALCIUM 9.3  --    Liver Function Tests: Recent Labs  Lab 03/24/23 1240  AST 25  ALT 30  ALKPHOS 55  BILITOT 0.8  PROT 7.3  ALBUMIN 4.0   No results for input(s): "LIPASE", "AMYLASE" in the last 168 hours. No results for input(s): "AMMONIA" in the last 168 hours. CBC: Recent Labs  Lab 03/24/23 1240 03/24/23 1753  WBC 7.5 7.0  NEUTROABS 5.1  --   HGB 13.9 13.1  HCT 41.1 39.1  MCV 97.2 96.3  PLT 216 231   Cardiac Enzymes: No results for input(s): "CKTOTAL", "CKMB", "CKMBINDEX", "TROPONINI" in the last 168 hours. BNP: Invalid input(s): "POCBNP" CBG: No results for input(s): "GLUCAP" in the last 168 hours. D-Dimer No results for input(s): "DDIMER" in the last 72 hours. Hgb A1c No results for input(s): "HGBA1C" in the last 72 hours. Lipid Profile No results for input(s): "CHOL", "HDL", "LDLCALC", "TRIG", "CHOLHDL", "LDLDIRECT" in the last 72 hours. Thyroid function studies No results for input(s): "TSH", "T4TOTAL", "T3FREE", "THYROIDAB" in the last 72 hours.  Invalid  input(s): "FREET3" Anemia work up No results for input(s): "VITAMINB12", "FOLATE", "FERRITIN", "TIBC", "IRON", "RETICCTPCT" in the last 72 hours. Urinalysis    Component Value Date/Time   COLORURINE YELLOW 03/24/2023 1202   APPEARANCEUR CLEAR 03/24/2023 1202   LABSPEC 1.015 03/24/2023 1202   PHURINE 7.0 03/24/2023 1202   GLUCOSEU NEGATIVE 03/24/2023 1202   GLUCOSEU NEGATIVE 03/27/2013 1034   HGBUR NEGATIVE 03/24/2023 1202   BILIRUBINUR NEGATIVE 03/24/2023 1202   KETONESUR NEGATIVE 03/24/2023 1202   PROTEINUR NEGATIVE  03/24/2023 1202   UROBILINOGEN 0.2 03/27/2013 1034   NITRITE NEGATIVE 03/24/2023 1202   LEUKOCYTESUR NEGATIVE 03/24/2023 1202   Sepsis Labs Recent Labs  Lab 03/24/23 1240 03/24/23 1753  WBC 7.5 7.0   Microbiology No results found for this or any previous visit (from the past 240 hour(s)).   Time coordinating discharge: 35 minutes  SIGNED:   Dorcas Carrow, MD  [facility] 03/28/2023, 9:59 AM

## 2023-03-28 NOTE — Progress Notes (Signed)
Occupational Therapy Treatment Patient Details Name: [name] MRN: [id number] DOB: 1931/04/05 Today's Date: 03/28/2023   History of present illness Pt is 89 yo female presenting 7/18 with persistent RUE weakness with associated tingling and numbness of her RLE as well with possible R facial paresthesias. 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 PMH: GERD, HLD, DJD with radiculopathy.   OT comments  Pt progressing well towards OT goals and remains eager to participate. Focus on R UE function including activities to address wrist extension, digit opposition/coordination and elbow flexion. Pt continues to require hands on assist for LB ADL completion w/ emphasis on incorporating R dominant UE to assist with task. Also addressed pt valued goals of being able to place contact lenses in and work on handwriting (trialed built up grip on pen though further work needed). Continue to feel pt will make excellent progress with intensive rehab services.    Recommendations for follow up therapy are one component of a multi-disciplinary discharge planning process, led by the attending physician.  Recommendations may be updated based on patient status, additional functional criteria and insurance authorization.    Assistance Recommended at Discharge Intermittent Supervision/Assistance  Patient can return home with the following  A little help with walking and/or transfers;A lot of help with bathing/dressing/bathroom;Assistance with cooking/housework;Assist for transportation;Help with stairs or ramp for entrance   Equipment Recommendations  Other (comment) (TBD)    Recommendations for Other Services Rehab consult    Precautions / Restrictions Precautions Precautions: Fall Restrictions Weight Bearing Restrictions: No       Mobility Bed Mobility               General bed mobility comments: in chair    Transfers Overall transfer level: Needs assistance Equipment used: Quad  cane Transfers: Sit to/from Stand Sit to Stand: Min guard                 Balance Overall balance assessment: Needs assistance Sitting-balance support: No upper extremity supported Sitting balance-Leahy Scale: Good     Standing balance support: Reliant on assistive device for balance, Single extremity supported Standing balance-Leahy Scale: Fair                             ADL either performed or assessed with clinical judgement   ADL Overall ADL's : Needs assistance/impaired Eating/Feeding: Set up;Sitting Eating/Feeding Details (indicate cue type and reason): assist to cut up food, able to feed with LUE   Grooming Details (indicate cue type and reason): simulated ADLs standing at sink w/ emphasis on WB through RUE standing at sink, as well as simulated strategies for washing hands. pt reporting difficulty managing contact lenses - simulated and discussed the UE motions to improve in order to complete             Lower Body Dressing: Moderate assistance;Sitting/lateral leans;Sit to/from stand Lower Body Dressing Details (indicate cue type and reason): focus on using R UE to assist LUE in donning socks. able to doff without assist though required cues/assist to use pincer grasp to hold sock with RUE and assist around toes             Functional mobility during ADLs: Min guard;Cane General ADL Comments: Trialed handwriting with built up grip and manual assist from OT to stabilize wrist (unable to find small R wrist support for session)    Extremity/Trunk Assessment Upper Extremity Assessment Upper Extremity Assessment: RUE deficits/detail RUE  Deficits / Details: grasp improving though still weak; able to hold built up foam grip on pen though not yet able to write. some improvement in active wrist flexion and able to minimally extend to neutral position with gravity minimized. able to flex elbow when held at 45* w/ gravity minimized. shoulder abduction to 90*,  shoulder flexion actively to 45* AAROM- often turning into abduction. arthritic changes to digits RUE Coordination: decreased fine motor;decreased gross motor LUE Deficits / Details: functional at all joints, some arthritic changes to hand   Lower Extremity Assessment Lower Extremity Assessment: Defer to PT evaluation        Vision   Vision Assessment?: No apparent visual deficits   Perception     Praxis      Cognition Arousal/Alertness: Awake/alert Behavior During Therapy: WFL for tasks assessed/performed Overall Cognitive Status: Within Functional Limits for tasks assessed                                          Exercises Exercises: Other exercises Other Exercises Other Exercises: wrist flexion/extension (gravity minimized and resting UE on armrest) Other Exercises: digit flexion/extension Other Exercises: opposition Other Exercises: elbow flexion reaching to face    Shoulder Instructions       General Comments Daughter present and supportive; inquiring about CMC joint brace/support - educated this may be more helpful if arthritic pain is a hinderance though weakness appears to be limiting grasp at this time. Discussed measuring lowest used chair height at home to simulate standing from this surface vs modifications to raise surface height    Pertinent Vitals/ Pain       Pain Assessment Pain Assessment: No/denies pain  Home Living                                          Prior Functioning/Environment              Frequency  Min 2X/week        Progress Toward Goals  OT Goals(current goals can now be found in the care plan section)  Progress towards OT goals: Progressing toward goals  Acute Rehab OT Goals Patient Stated Goal: be able to write, put contacts in OT Goal Formulation: With patient/family Time For Goal Achievement: 04/08/23 Potential to Achieve Goals: Good ADL Goals Pt Will Perform Grooming: with  modified independence;standing Pt Will Perform Lower Body Bathing: with modified independence;sit to/from stand;sitting/lateral leans Pt Will Perform Lower Body Dressing: with modified independence;sitting/lateral leans;sit to/from stand Pt Will Transfer to Toilet: with modified independence;ambulating Pt/caregiver will Perform Home Exercise Program: Increased strength;Right Upper extremity;Independently;With written HEP provided  Plan Discharge plan remains appropriate    Co-evaluation                 AM-PAC OT "6 Clicks" Daily Activity     Outcome Measure   Help from another person eating meals?: A Little Help from another person taking care of personal grooming?: A Little Help from another person toileting, which includes using toliet, bedpan, or urinal?: A Little Help from another person bathing (including washing, rinsing, drying)?: A Lot Help from another person to put on and taking off regular upper body clothing?: A Lot Help from another person to put on and taking off regular lower body clothing?: A Lot  6 Click Score: 15    End of Session Equipment Utilized During Treatment: Other (comment) (cane)  OT Visit Diagnosis: Unsteadiness on feet (R26.81);Other abnormalities of gait and mobility (R26.89);Muscle weakness (generalized) (M62.81)   Activity Tolerance Patient tolerated treatment well   Patient Left in chair;with call bell/phone within reach;with family/visitor present   Nurse Communication          Time: 1610-9604 OT Time Calculation (min): 41 min  Charges: OT General Charges $OT Visit: 1 Visit OT Treatments $Self Care/Home Management : 8-22 mins $Therapeutic Activity: 8-22 mins $Therapeutic Exercise: 8-22 mins  Bradd Canary, OTR/L Acute Rehab Services Office: [phone]   Lorre Munroe 03/28/2023, 2:16 PM

## 2023-03-28 NOTE — Plan of Care (Signed)

## 2023-03-28 NOTE — Progress Notes (Signed)
INPATIENT REHABILITATION ADMISSION NOTE   Arrival Method: WC     Mental Orientation: Oriented X4   Assessment: done   Skin: assessed   IV'S: present on right arm   Pain: intermittent knee pain   Tubes and Drains: none   Safety Measures: reviewed with pt and family   Vital Signs: done    Height and Weight: done   Rehab Orientation: done   Family: at bedside.    Notes: done  Marylu Lund, RN

## 2023-03-29 DIAGNOSIS — M1711 Unilateral primary osteoarthritis, right knee: Secondary | ICD-10-CM

## 2023-03-29 DIAGNOSIS — I10 Essential (primary) hypertension: Secondary | ICD-10-CM

## 2023-03-29 DIAGNOSIS — I951 Orthostatic hypotension: Secondary | ICD-10-CM

## 2023-03-29 DIAGNOSIS — R7989 Other specified abnormal findings of blood chemistry: Secondary | ICD-10-CM | POA: Diagnosis not present

## 2023-03-29 DIAGNOSIS — I63412 Cerebral infarction due to embolism of left middle cerebral artery: Secondary | ICD-10-CM | POA: Diagnosis not present

## 2023-03-29 LAB — CBC WITH DIFFERENTIAL/PLATELET
Abs Immature Granulocytes: 0.05 10*3/uL (ref 0.00–0.07)
Basophils Absolute: 0 10*3/uL (ref 0.0–0.1)
Basophils Relative: 1 %
Eosinophils Absolute: 0.4 10*3/uL (ref 0.0–0.5)
Eosinophils Relative: 7 %
HCT: 38.4 % (ref 36.0–46.0)
Hemoglobin: 12.7 g/dL (ref 12.0–15.0)
Immature Granulocytes: 1 %
Lymphocytes Relative: 33 %
Lymphs Abs: 1.8 10*3/uL (ref 0.7–4.0)
MCH: 32.2 pg (ref 26.0–34.0)
MCHC: 33.1 g/dL (ref 30.0–36.0)
MCV: 97.5 fL (ref 80.0–100.0)
Monocytes Absolute: 0.5 10*3/uL (ref 0.1–1.0)
Monocytes Relative: 10 %
Neutro Abs: 2.7 10*3/uL (ref 1.7–7.7)
Neutrophils Relative %: 48 %
Platelets: 216 10*3/uL (ref 150–400)
RBC: 3.94 MIL/uL (ref 3.87–5.11)
RDW: 13.5 % (ref 11.5–15.5)
WBC: 5.5 10*3/uL (ref 4.0–10.5)
nRBC: 0 % (ref 0.0–0.2)

## 2023-03-29 LAB — COMPREHENSIVE METABOLIC PANEL
ALT: 21 U/L (ref 0–44)
AST: 18 U/L (ref 15–41)
Albumin: 3.4 g/dL — ABNORMAL LOW (ref 3.5–5.0)
Alkaline Phosphatase: 49 U/L (ref 38–126)
Anion gap: 9 (ref 5–15)
BUN: 19 mg/dL (ref 8–23)
CO2: 21 mmol/L — ABNORMAL LOW (ref 22–32)
Calcium: 9 mg/dL (ref 8.9–10.3)
Chloride: 105 mmol/L (ref 98–111)
Creatinine, Ser: 1.01 mg/dL — ABNORMAL HIGH (ref 0.44–1.00)
GFR, Estimated: 53 mL/min — ABNORMAL LOW (ref >60)
Glucose, Bld: 113 mg/dL — ABNORMAL HIGH (ref 70–99)
Potassium: 4.1 mmol/L (ref 3.5–5.1)
Sodium: 135 mmol/L (ref 135–145)
Total Bilirubin: 0.7 mg/dL (ref 0.3–1.2)
Total Protein: 6.3 g/dL — ABNORMAL LOW (ref 6.5–8.1)

## 2023-03-29 NOTE — Evaluation (Signed)
Occupational Therapy Assessment and Plan  Patient Details  Name: [name] MRN: [id number] Date of Birth: August 23, 1931  OT Diagnosis: hemiplegia affecting dominant side, muscle weakness (generalized), pain in joint, and decreased activity tolerance Rehab Potential: Rehab Potential (ACUTE ONLY): Excellent ELOS: 7-10 days   Today's Date: 03/29/2023 OT Individual Time: 4098-1191 OT Individual Time Calculation (min): 77 min     Hospital Problem: Principal Problem:   CVA (cerebral vascular accident) [facility])   Past Medical History:  Past Medical History:  Diagnosis Date   Arthritis    Arthropathy, unspecified, site unspecified    Dr. Noel Gerold...right clavicular head swelling 2003   Colon polyp    colonoscopy 06-24-06 with no adenomatous change, hyperplasia  only   Environmental allergies    GERD (gastroesophageal reflux disease)    Healthcare maintenance    CPX 12-24-09, Td 1/05, pneumovax 2000 age 43, G[facility] [name] ([place])   Hyperlipidemia    target less than 160 > try off muscle aches 03-18-10   Morbid obesity (HCC)    ideal = 142.  target wt = 158 for BMI < 30   Osteopenia    BMD 08-07-08 tspin - 1.2, left femur - .4, right femur +.2.  repeat 01-02-10  1.9    0.1   0.5   Rhinitis, chronic    Dr. Linward Foster...on immunotherapy since 5/08   Past Surgical History:  Past Surgical History:  Procedure Laterality Date   BUNIONECTOMY  1982   bilateral   CHOLECYSTECTOMY N/A 07/17/2020   Procedure: LAPAROSCOPIC CHOLECYSTECTOMY;  Surgeon: Axel Filler, MD;  Location: [facility] OR;  Service: General;  Laterality: N/A;   FOOT SURGERY  1970   bilateral 5th toes, bones removed   GANGLION CYST EXCISION  2000   3rd finger right hand   TUBAL LIGATION  1971    Assessment & Plan Clinical Impression: Patient is a 89 year old female presented to [facility] health emergency department at [facility] on 03/24/2023 complaining of worsening right upper arm weakness. She had been reporting the  symptoms for approximately 2 months per family report. She was recently treated for cervical spondylolisthesis and radiculopathy as well as dizziness. First son stated 11 hours prior to admission her right arm weakness worsened significantly. Stroke was activated and she was seen in consultation by Dr. Selina Cooley. She was out of the window for tenecteplase. Aspirin and Plavix started. CTA was not performed as part of her code stroke because exam was not consistent with LVO. Brain revealed scattered acute to subacute patchy ischemic infarctions evolving cortical and subcortical aspect of the left frontal and parietal lobes. No hemorrhage noted. MRI of the C-spine without acute abnormality. She underwent 2D echo with ejection fraction of approximately 60 to 65%. LDL 131 and hemoglobin A1c 6.0%. She was started on heparin subcutaneously for VTE prophylaxis. Neurology recommends a 30-day heart monitor on discharge. Her social history is significant for loss of her husband in January of this year after 60+ years of marriage and she is exhibiting significant depression. Zoloft started. Neurology plans to continue DAPT for 3 months./OT/SLP evaluations obtained. He is tolerating regular texture diet. Currently requiring min assist for bed mobility and transfers, using quite cane for ambulation. 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 Patient transferred to CIR on 03/28/2023 .    Patient currently requires min A with basic self-care skills secondary to muscle weakness, decreased  cardiorespiratoy endurance, impaired timing and sequencing, unbalanced muscle activation, and decreased coordination, decreased attention to right, decreased memory, and decreased standing balance, hemiplegia, and decreased balance strategies.  Prior to hospitalization, patient could complete ADLs/IADLs  independently.   Patient will benefit from skilled intervention to increase independence with basic self-care skills and increase level of independence with iADL prior to discharge home with care partner.  Anticipate patient will require 24 hour supervision and follow up outpatient.  OT - End of Session Activity Tolerance: Tolerates 30+ min activity with multiple rests Endurance Deficit: Yes OT Assessment Rehab Potential (ACUTE ONLY): Excellent OT Barriers to Discharge: Incontinence OT Patient demonstrates impairments in the following area(s): Balance;Endurance;Pain OT Basic ADL's Functional Problem(s): Bathing;Dressing;Toileting;Eating;Grooming OT Advanced ADL's Functional Problem(s): Simple Meal Preparation;Light Housekeeping OT Transfers Functional Problem(s): Toilet;Tub/Shower OT Additional Impairment(s): Fuctional Use of Upper Extremity OT Plan OT Intensity: Minimum of 1-2 x/day, 45 to 90 minutes OT Frequency: 5 out of 7 days OT Duration/Estimated Length of Stay: 7-10 days OT Treatment/Interventions: Balance/vestibular training;Cognitive remediation/compensation;Community reintegration;Discharge planning;Disease mangement/prevention;DME/adaptive equipment instruction;Functional electrical stimulation;Functional mobility training;Neuromuscular re-education;Pain management;Patient/family education;Psychosocial support;Self Care/advanced ADL retraining;Skin care/wound managment;Splinting/orthotics;Therapeutic Activities;Therapeutic Exercise;UE/LE Strength taining/ROM;UE/LE Coordination activities;Visual/perceptual remediation/compensation;Wheelchair propulsion/positioning OT Self Feeding Anticipated Outcome(s): Mod I OT Basic Self-Care Anticipated Outcome(s): Supervision OT Toileting Anticipated Outcome(s): Supervision OT Bathroom Transfers Anticipated Outcome(s): Supervision OT Recommendation Recommendations for Other Services: Neuropsych consult;Therapeutic Recreation  consult Therapeutic Recreation Interventions: Stress management;Outing/community reintergration Patient destination: Home Follow Up Recommendations: Outpatient OT Equipment Recommended: To be determined   OT Evaluation Precautions/Restrictions  Precautions Precautions: Fall Precaution Comments: R-hemipleiga (UE>LE) Restrictions Weight Bearing Restrictions: No General Chart Reviewed: Yes Family/Caregiver Present: Yes (Adrianne, daughter.) Pain Pain Assessment Pain Scale: 0-10 Pain Score: 5  Pain Type: Chronic pain Pain Location: Knee Pain Orientation: Right Pain Intervention(s): Medication (See eMAR);Rest Multiple Pain Sites: Yes 2nd Pain Site Pain Score: 6 Pain Type: Acute pain Pain Location: Neck Pain Orientation: Right Home Living/Prior Functioning Home Living Family/patient expects to be discharged to:: Private residence Living Arrangements: Children Available Help at Discharge: Family, Available 24 hours/day Type of Home: House Home Access: Stairs to enter [employer] of Steps: 3 Entrance Stairs-Rails: Can reach both Home Layout: One level Bathroom Shower/Tub: Tub/shower unit Public relations account executive, grab bar at entrance, and long-handled shower head.) Bathroom Toilet: Handicapped height (Has BSC on top.) Bathroom Accessibility: Yes  Lives With: Daughter IADL History Homemaking Responsibilities: Yes Meal Prep Responsibility: Secondary Cleaning Responsibility: Secondary Current License: Yes Mode of Transportation: Set designer Occupation: Retired Leisure and Hobbies: Sow, Financial risk analyst, Scientist, physiological, Catering manager, reading Prior Function Level of Independence: Independent with basic ADLs, Independent with homemaking with ambulation, Needs assistance with gait (Intermediate use of cane during the last year, during times of increased R-knee pain/stiffness.) Driving: Yes Vocation: Retired Administrator, sports Baseline Vision/History: 1 Wears glasses Patient Visual Report: No change from  baseline Vision Assessment?: No apparent visual deficits Perception  Perception: Within Functional Limits Praxis Praxis: Intact Cognition Cognition Overall Cognitive Status: Within Functional Limits for tasks assessed Arousal/Alertness: Awake/alert Memory: Appears intact Problem Solving: Appears intact Safety/Judgment: Appears intact Brief Interview for Mental Status (BIMS) Repetition of Three Words (First Attempt): 3 Temporal Orientation: Year: Correct Temporal Orientation: Month: Accurate within 5 days Temporal Orientation: Day: Correct Recall: "Sock": No, could not recall Recall: "Blue": Yes, no cue required Recall: "Bed": No, could not recall BIMS Summary Score: 11 Sensation Sensation Light Touch: Impaired by gross assessment Hot/Cold: Appears Intact Proprioception: Impaired by gross assessment (Some RUE neglect noted.) Coordination Gross Motor Movements are Fluid and Coordinated:  No Fine Motor Movements are Fluid and Coordinated: No Coordination and Movement Description: Decreased due to R-hemiplegia. Motor  Motor Motor: Hemiplegia Motor - Skilled Clinical Observations: R-hemi (UE>LE)  Trunk/Postural Assessment  Cervical Assessment Cervical Assessment: Within Functional Limits Thoracic Assessment Thoracic Assessment: Within Functional Limits Lumbar Assessment Lumbar Assessment: Within Functional Limits Postural Control Postural Control: Within Functional Limits  Balance Balance Balance Assessed: Yes Static Sitting Balance Static Sitting - Balance Support: Feet supported Static Sitting - Level of Assistance: 5: Stand by assistance Dynamic Sitting Balance Dynamic Sitting - Balance Support: During functional activity Dynamic Sitting - Level of Assistance: 5: Stand by assistance Dynamic Sitting - Balance Activities: Lateral lean/weight shifting;Forward lean/weight shifting Static Standing Balance Static Standing - Balance Support: During functional  activity;Bilateral upper extremity supported Static Standing - Level of Assistance: 5: Stand by assistance (CGA) Dynamic Standing Balance Dynamic Standing - Balance Support: During functional activity Dynamic Standing - Level of Assistance: 5: Stand by assistance (CGA) Dynamic Standing - Balance Activities: Lateral lean/weight shifting;Forward lean/weight shifting Extremity/Trunk Assessment RUE Assessment RUE Assessment: Exceptions to [facility] RUE Body System: Neuro Brunstrum levels for arm and hand: Arm;Hand LUE Assessment LUE Assessment: Within Functional Limits  Care Tool Care Tool Self Care Eating   Eating Assist Level: Set up assist    Oral Care    Oral Care Assist Level: Set up assist    Bathing   Body parts bathed by patient: Right arm;Left arm;Chest;Abdomen;Front perineal area;Buttocks;Right upper leg;Left upper leg;Right lower leg;Left lower leg;Face     Assist Level: Contact Guard/Touching assist    Upper Body Dressing(including orthotics)   What is the patient wearing?: Pull over shirt   Assist Level: Minimal Assistance - Patient > 75%    Lower Body Dressing (excluding footwear)   What is the patient wearing?: Underwear/pull up;Pants Assist for lower body dressing: Minimal Assistance - Patient > 75%    Putting on/Taking off footwear   What is the patient wearing?: Non-skid slipper socks Assist for footwear: Maximal Assistance - Patient 25 - 49%       Care Tool Toileting Toileting activity Toileting Activity did not occur (Clothing management and hygiene only): N/A (no void or bm)       Care Tool Bed Mobility Roll left and right activity    Defer to PT Eval    Sit to lying activity    Defer to PT Eval    Lying to sitting on side of bed activity    Defer to PT Eval     Care Tool Transfers Sit to stand transfer    Defer to PT Eval    Chair/bed transfer    Defer to PT Eval     Toilet transfer   Assist Level: Contact Guard/Touching assist     Care  Tool Cognition  Expression of Ideas and Wants Expression of Ideas and Wants: 4. Without difficulty (complex and basic) - expresses complex messages without difficulty and with speech that is clear and easy to understand  Understanding Verbal and Non-Verbal Content Understanding Verbal and Non-Verbal Content: 4. Understands (complex and basic) - clear comprehension without cues or repetitions   Memory/Recall Ability Memory/Recall Ability : Staff names and faces;That he or she is in a hospital/hospital unit;Current season   Refer to Care Plan for Long Term Goals  SHORT TERM GOAL WEEK 1 OT Short Term Goal 1 (Week 1): STGs=LTGs due to patient's length of stay.  Recommendations for other services: Neuropsych and Therapeutic Recreation  Stress management and Outing/community  reintegration   Skilled Therapeutic Intervention Pt received resting in bed for skilled OT session with focus on comprehensive OT evaluation. Pt agreeable to interventions, demonstrating overall pleasant mood. Pt multiple pain sites, details noted above. OT offering intermediate rest breaks and positioning suggestions throughout session to address pain/fatigue and maximize participation/safety in session.  Session began with introduction to OT role, OT POC, and general orientation to rehab unit/schedule. Pt then completes full body bathing/dressing with assistance levels noted below.   Pt remained sitting in [facility] with all immediate needs met at end of session. Pt continues to be appropriate for skilled OT intervention to promote further functional independence.   ADL ADL Eating: Set up Where Assessed-Eating: Wheelchair Grooming: Setup Where Assessed-Grooming: Edge of bed Upper Body Bathing: Setup;Supervision/safety Where Assessed-Upper Body Bathing: Shower Lower Body Bathing: Contact guard Where Assessed-Lower Body Bathing: Shower Upper Body Dressing: Minimal assistance Where Assessed-Upper Body Dressing: Edge of  bed Lower Body Dressing: Minimal assistance Where Assessed-Lower Body Dressing: Edge of bed Toileting: Minimal assistance Where Assessed-Toileting: Toilet;Bedside Commode Toilet Transfer: Furniture conservator/restorer Method: Proofreader: Bedside commode;Grab bars Film/video editor: Administrator, arts Method: Designer, industrial/product: Scientist, research (medical) Sit to Stand: Contact Guard/Touching assist Stand to Sit: Contact Guard/Touching assist   Discharge Criteria: Patient will be discharged from OT if patient refuses treatment 3 consecutive times without medical reason, if treatment goals not met, if there is a change in medical status, if patient makes no progress towards goals or if patient is discharged from hospital.  The above assessment, treatment plan, treatment alternatives and goals were discussed and mutually agreed upon: by patient  Lou Cal, OTR/L, MSOT  03/29/2023, 10:08 AM

## 2023-03-29 NOTE — Plan of Care (Signed)
°  Problem: RH SAFETY Goal: RH STG ADHERE TO SAFETY PRECAUTIONS W/ASSISTANCE/DEVICE Description: STG Adhere to Safety Precautions With cues Assistance/Device. Outcome: Progressing   

## 2023-03-29 NOTE — Plan of Care (Signed)
  Problem: RH Balance Goal: LTG Patient will maintain dynamic sitting balance (PT) Description: LTG:  Patient will maintain dynamic sitting balance with assistance during mobility activities (PT) Flowsheets (Taken 03/29/2023 1527) LTG: Pt will maintain dynamic sitting balance during mobility activities with:: Independent Goal: LTG Patient will maintain dynamic standing balance (PT) Description: LTG:  Patient will maintain dynamic standing balance with assistance during mobility activities (PT) Flowsheets (Taken 03/29/2023 1527) LTG: Pt will maintain dynamic standing balance during mobility activities with:: Independent with assistive device    Problem: Sit to Stand Goal: LTG:  Patient will perform sit to stand with assistance level (PT) Description: LTG:  Patient will perform sit to stand with assistance level (PT) Flowsheets (Taken 03/29/2023 1527) LTG: PT will perform sit to stand in preparation for functional mobility with assistance level: Independent with assistive device   Problem: RH Bed Mobility Goal: LTG Patient will perform bed mobility with assist (PT) Description: LTG: Patient will perform bed mobility with assistance, with/without cues (PT). Flowsheets (Taken 03/29/2023 1527) LTG: Pt will perform bed mobility with assistance level of: Independent   Problem: RH Bed to Chair Transfers Goal: LTG Patient will perform bed/chair transfers w/assist (PT) Description: LTG: Patient will perform bed to chair transfers with assistance (PT). Flowsheets (Taken 03/29/2023 1527) LTG: Pt will perform Bed to Chair Transfers with assistance level: Independent with assistive device    Problem: RH Car Transfers Goal: LTG Patient will perform car transfers with assist (PT) Description: LTG: Patient will perform car transfers with assistance (PT). Flowsheets (Taken 03/29/2023 1527) LTG: Pt will perform car transfers with assist:: Set up assist    Problem: RH Furniture Transfers Goal: LTG Patient  will perform furniture transfers w/assist (OT/PT) Description: LTG: Patient will perform furniture transfers  with assistance (OT/PT). Flowsheets (Taken 03/29/2023 1527) LTG: Pt will perform furniture transfers with assist:: Minimal Assistance - Patient > 75%   Problem: RH Ambulation Goal: LTG Patient will ambulate in controlled environment (PT) Description: LTG: Patient will ambulate in a controlled environment, # of feet with assistance (PT). Flowsheets (Taken 03/29/2023 1527) LTG: Pt will ambulate in controlled environ  assist needed:: Independent with assistive device LTG: Ambulation distance in controlled environment: 150 Goal: LTG Patient will ambulate in home environment (PT) Description: LTG: Patient will ambulate in home environment, # of feet with assistance (PT). Flowsheets (Taken 03/29/2023 1527) LTG: Pt will ambulate in home environ  assist needed:: Supervision/Verbal cueing LTG: Ambulation distance in home environment: 100   Problem: RH Stairs Goal: LTG Patient will ambulate up and down stairs w/assist (PT) Description: LTG: Patient will ambulate up and down # of stairs with assistance (PT) Flowsheets (Taken 03/29/2023 1529) LTG: Pt will ambulate up/down stairs assist needed:: Contact Guard/Touching assist LTG: Pt will  ambulate up and down number of stairs: 12 steps   Problem: RH Ambulation Goal: LTG Patient will ambulate in community environment (PT) Description: LTG: Patient will ambulate in community environment, # of feet with assistance (PT). Flowsheets (Taken 03/29/2023 1529) LTG: Pt will ambulate in community environ  assist needed:: Minimal Assistance - Patient > 75% LTG: Ambulation distance in community environment: 25 feet on grass for home entry

## 2023-03-29 NOTE — Progress Notes (Signed)
Inpatient Rehabilitation Care Coordinator Assessment and Plan Patient Details  Name: [name] MRN: [id number] Date of Birth: [birth year]  Today's Date: 03/29/2023  Hospital Problems: Principal Problem:   CVA (cerebral vascular accident) [facility])  Past Medical History:  Past Medical History:  Diagnosis Date   Arthritis    Arthropathy, unspecified, site unspecified    Dr. Noel Gerold...right clavicular head swelling 2003   Colon polyp    colonoscopy 06-24-06 with no adenomatous change, hyperplasia  only   Environmental allergies    GERD (gastroesophageal reflux disease)    Healthcare maintenance    CPX 12-24-09, Td 1/05, pneumovax 2000 age 16, [name] ([place])   Hyperlipidemia    target less than 160 > try off muscle aches 03-18-10   Morbid obesity (HCC)    ideal = 142.  target wt = 158 for BMI < 30   Osteopenia    BMD 08-07-08 tspin - 1.2, left femur - .4, right femur +.2.  repeat 01-02-10  1.9    0.1   0.5   Rhinitis, chronic    Dr. Linward Foster...on immunotherapy since 5/08   Past Surgical History:  Past Surgical History:  Procedure Laterality Date   BUNIONECTOMY  1982   bilateral   CHOLECYSTECTOMY N/A 07/17/2020   Procedure: LAPAROSCOPIC CHOLECYSTECTOMY;  Surgeon: Axel Filler, MD;  Location: [facility] OR;  Service: General;  Laterality: N/A;   FOOT SURGERY  1970   bilateral 5th toes, bones removed   GANGLION CYST EXCISION  2000   3rd finger right hand   TUBAL LIGATION  1971   Social History:  reports that she quit smoking about 67 years ago. Her smoking use included cigarettes. She started smoking about 72 years ago. She has a 2.5 pack-year smoking history. She has never used smokeless tobacco. She reports that she does not drink alcohol and does not use drugs.  Family / Support Systems Marital Status: Widow/Widower Patient Roles: Parent, Other (Comment) (grandmother) Children: Adrianene-daughter [phone]  Angela-daughter [phone]  Christopher-son [phone]   Andrea-daughter [phone] Other Supports: grandchildren and extended family Anticipated Caregiver: Andrianene Ability/Limitations of Caregiver: Between children and grandchildren they will provide 24/7 care if needed. Caregiver Availability: 24/7 Family Dynamics: Close family unti who will pull together and provide the care pt needs. 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 There will always be a family member here with her for support   Abuse/Neglect Abuse/Neglect Assessment Can Be Completed: Yes Physical Abuse: Denies Verbal Abuse: Denies Sexual Abuse: Denies Exploitation of patient/patient's resources: Denies Self-Neglect: Denies  Patient response to: Social Isolation - How often do you feel lonely or isolated from those around you?: Never  Emotional Status Pt's affect, behavior and adjustment status: Pt is motivated to do well and recover from her stroke, she is one who was independent and did what she could do for herself. She remains active and likes being with family members. Recent Psychosocial Issues: other health issues were managed prior to admission Psychiatric History: No history seems to be optimistic and positive in her recovery. She has made progress  already which is encouraging to her. Substance Abuse History: No issues  Patient / Family Perceptions, Expectations & Goals Pt/Family understanding of illness & functional limitations: Pt and daughter can explain her stroke and  deficits as a result of this and does talk with the MD's involved. Both are hopeful she will do well here. Premorbid pt/family roles/activities: Mom, grandmother, retiree, church member, neighbor, Catering manager Anticipated changes in roles/activities/participation: resume Pt/family expectations/goals: Pt states: " I hope to be able to take care of myself when I leave here."  Daughter states: " We hope for the best and know she will work hard while here."  [employer]: None Premorbid Home Care/DME Agencies: Other (Comment) (had OP in the past) Transportation available at discharge: family Is the patient able to respond to transportation needs?: Yes In the past 12 months, has lack of transportation kept you from medical appointments or from getting medications?: No In the past 12 months, has lack of transportation kept you from meetings, work, or from getting things needed for daily living?: No  Discharge Planning Living Arrangements: Children Support Systems: Children, Other relatives, Friends/neighbors Type of Residence: Private residence Insurance Resources: Media planner (specify) Investment banker, operational) Financial Resources: Restaurant manager, fast food Screen Referred: No Living Expenses: Lives with family Money Management: Family Does the patient have any problems obtaining your medications?: No Home Management: both she and family Patient/Family Preliminary Plans: Return home with daughter and other family members rotating her care if needed. her family was very involved prior to this admission. Aware being evaluated today and goals being set. Care Coordinator Anticipated Follow Up Needs: HH/OP  Clinical Impression Pleasant female who looks younger than her stared age. She was active prior to admission and hopes to recover from this stroke. Her daughter is present and very supportive they will be available 24/7 at discharge. Await therapy evaluations today.  Lucy Chris 03/29/2023, 10:29 AM

## 2023-03-29 NOTE — Progress Notes (Signed)
Inpatient Rehabilitation  Patient information reviewed and entered into eRehab system by Melissa M. Bowie, M.A., CCC/SLP, PPS Coordinator.  Information including medical coding, functional ability and quality indicators will be reviewed and updated through discharge.    

## 2023-03-29 NOTE — Progress Notes (Signed)
Inpatient Rehabilitation Center Individual Statement of Services  Patient Name:  [name]  Date:  03/29/2023  Welcome to the Inpatient Rehabilitation Center.  Our goal is to provide you with an individualized program based on your diagnosis and situation, designed to meet your specific needs.  With this comprehensive rehabilitation program, you will be expected to participate in at least 3 hours of rehabilitation therapies Monday-Friday, with modified therapy programming on the weekends.  Your rehabilitation program will include the following services:  Physical Therapy (PT), Occupational Therapy (OT), Speech Therapy (ST), 24 hour per day rehabilitation nursing, Therapeutic Recreaction (TR), Care Coordinator, Rehabilitation Medicine, Nutrition Services, and Pharmacy Services  Weekly team conferences will be held on Wednesday to discuss your progress.  Your Inpatient Rehabilitation Care Coordinator will talk with you frequently to get your input and to update you on team discussions.  Team conferences with you and your family in attendance may also be held.  Expected length of stay: 7-10 days  Overall anticipated outcome:mod/I-supervision level   Depending on your progress and recovery, your program may change. Your Inpatient Rehabilitation Care Coordinator will coordinate services and will keep you informed of any changes. Your Inpatient Rehabilitation Care Coordinator's name and contact numbers are listed  below.  The following services may also be recommended but are not provided by the Inpatient Rehabilitation Center:   Home Health Rehabiltiation Services Outpatient Rehabilitation Services    Arrangements will be made to provide these services after discharge if needed.  Arrangements include referral to agencies that provide these services.  Your insurance has been verified to be:  [employer]-Medicare Your primary doctor is:  Abbey Chatters  Pertinent information will be shared with  your doctor and your insurance company.  Inpatient Rehabilitation Care Coordinator:  Dossie Der, [place] [phone] or [phone]  Information discussed with and copy given to patient by: Lucy Chris, 03/29/2023, 10:30 AM

## 2023-03-29 NOTE — Plan of Care (Signed)
  Problem: RH Balance Goal: LTG Patient will maintain dynamic standing with ADLs (OT) Description: LTG:  Patient will maintain dynamic standing balance with assist during activities of daily living (OT)  Flowsheets (Taken 03/29/2023 2029) LTG: Pt will maintain dynamic standing balance during ADLs with: Supervision/Verbal cueing   Problem: RH Eating Goal: LTG Patient will perform eating w/assist, cues/equip (OT) Description: LTG: Patient will perform eating with assist, with/without cues using equipment (OT) Flowsheets (Taken 03/29/2023 2029) LTG: Pt will perform eating with assistance level of: Independent with assistive device    Problem: RH Bathing Goal: LTG Patient will bathe all body parts with assist levels (OT) Description: LTG: Patient will bathe all body parts with assist levels (OT) Flowsheets (Taken 03/29/2023 2029) LTG: Pt will perform bathing with assistance level/cueing: Supervision/Verbal cueing   Problem: RH Dressing Goal: LTG Patient will perform upper body dressing (OT) Description: LTG Patient will perform upper body dressing with assist, with/without cues (OT). Flowsheets (Taken 03/29/2023 2029) LTG: Pt will perform upper body dressing with assistance level of: Supervision/Verbal cueing Goal: LTG Patient will perform lower body dressing w/assist (OT) Description: LTG: Patient will perform lower body dressing with assist, with/without cues in positioning using equipment (OT) Flowsheets (Taken 03/29/2023 2029) LTG: Pt will perform lower body dressing with assistance level of: Supervision/Verbal cueing   Problem: RH Toileting Goal: LTG Patient will perform toileting task (3/3 steps) with assistance level (OT) Description: LTG: Patient will perform toileting task (3/3 steps) with assistance level (OT)  Flowsheets (Taken 03/29/2023 2029) LTG: Pt will perform toileting task (3/3 steps) with assistance level: Supervision/Verbal cueing   Problem: RH Functional Use of Upper  Extremity Goal: LTG Patient will use RT/LT upper extremity as a (OT) Description: LTG: Patient will use right/left upper extremity as a stabilizer/gross assist/diminished/nondominant/dominant level with assist, with/without cues during functional activity (OT) Flowsheets (Taken 03/29/2023 2029) LTG: Use of upper extremity in functional activities: RUE as gross assist level LTG: Pt will use upper extremity in functional activity with assistance level of: Independent with assistive device   Problem: RH Simple Meal Prep Goal: LTG Patient will perform simple meal prep w/assist (OT) Description: LTG: Patient will perform simple meal prep with assistance, with/without cues (OT). Flowsheets (Taken 03/29/2023 2029) LTG: Pt will perform simple meal prep with assistance level of: Supervision/Verbal cueing   Problem: RH Light Housekeeping Goal: LTG Patient will perform light housekeeping w/assist (OT) Description: LTG: Patient will perform light housekeeping with assistance, with/without cues (OT). Flowsheets (Taken 03/29/2023 2029) LTG: Pt will perform light housekeeping with assistance level of: Supervision/Verbal cueing   Problem: RH Toilet Transfers Goal: LTG Patient will perform toilet transfers w/assist (OT) Description: LTG: Patient will perform toilet transfers with assist, with/without cues using equipment (OT) Flowsheets (Taken 03/29/2023 2029) LTG: Pt will perform toilet transfers with assistance level of: Supervision/Verbal cueing   Problem: RH Tub/Shower Transfers Goal: LTG Patient will perform tub/shower transfers w/assist (OT) Description: LTG: Patient will perform tub/shower transfers with assist, with/without cues using equipment (OT) Flowsheets (Taken 03/29/2023 2029) LTG: Pt will perform tub/shower stall transfers with assistance level of: Supervision/Verbal cueing

## 2023-03-29 NOTE — Evaluation (Signed)
Physical Therapy Assessment and Plan  Patient Details  Name: [name] MRN: [id number] Date of Birth: March 21, 1931  PT Diagnosis: Abnormality of gait, BPPV, Cognitive deficits, Difficulty walking, Hemiparesis dominant, Impaired cognition, Impaired sensation, and Muscle weakness Rehab Potential: Good ELOS: 7-10 days   Today's Date: 03/29/2023 PT Individual Time: 1047-1201 PT Individual Time Calculation (min): 74 min    Hospital Problem: Principal Problem:   [name]   Past Medical History:  Past Medical History:  Diagnosis Date   Arthritis    Arthropathy, unspecified, site unspecified    Dr. Noel Gerold...right clavicular head swelling 2003   Colon polyp    colonoscopy 06-24-06 with no adenomatous change, hyperplasia  only   Environmental allergies    GERD (gastroesophageal reflux disease)    Healthcare maintenance    CPX 12-24-09, Td 1/05, pneumovax 2000 age 41, G[facility] [name] ([place])   Hyperlipidemia    target less than 160 > try off muscle aches 03-18-10   Morbid obesity (HCC)    ideal = 142.  target wt = 158 for BMI < 30   Osteopenia    BMD 08-07-08 tspin - 1.2, left femur - .4, right femur +.2.  repeat 01-02-10  1.9    0.1   0.5   Rhinitis, chronic    Dr. Linward Foster...on immunotherapy since 5/08   Past Surgical History:  Past Surgical History:  Procedure Laterality Date   BUNIONECTOMY  1982   bilateral   CHOLECYSTECTOMY N/A 07/17/2020   Procedure: LAPAROSCOPIC CHOLECYSTECTOMY;  Surgeon: Axel Filler, MD;  Location: [facility] OR;  Service: General;  Laterality: N/A;   FOOT SURGERY  1970   bilateral 5th toes, bones removed   GANGLION CYST EXCISION  2000   3rd finger right hand   TUBAL LIGATION  1971    Assessment & Plan Clinical Impression: Patient is a 89 y.o. year old female   Patient currently requires min with mobility secondary to muscle weakness, decreased cardiorespiratoy endurance, impaired timing and sequencing, unbalanced  muscle activation, and decreased coordination, decreased attention to right, decreased memory, and decreased standing balance, hemiplegia, and decreased balance strategies.  Prior to hospitalization, patient was independent  with mobility and lived with Daughter Mackenzie Land) in a House home.  Home access is 3Stairs to enter, Level entry (level entry patio entrance if pt walks through the grass, paved entrance 3 step to entry with 2 handrails, can not reach simultaneously).  Patient will benefit from skilled PT intervention to maximize safe functional mobility, minimize fall risk, and decrease caregiver burden for planned discharge home with 24 hour assist.  Anticipate patient will benefit from follow up OP at discharge.  PT - End of Session Activity Tolerance: Tolerates 30+ min activity with multiple rests Endurance Deficit: Yes PT Assessment Rehab Potential (ACUTE/IP ONLY): Good PT Barriers to Discharge: Decreased caregiver support;Home environment access/layout PT Barriers to Discharge Comments: R hemi PT Patient demonstrates impairments in the following area(s): Balance;Behavior;Endurance;Motor;Pain;Safety;Sensory PT Transfers Functional Problem(s): Bed Mobility;Bed to Chair;Car PT Locomotion Functional Problem(s): Ambulation;Wheelchair Mobility;Stairs PT Plan PT Intensity: Minimum of 1-2 x/day ,45 to 90 minutes PT Frequency: 5 out of 7 days PT Duration Estimated Length of Stay: 7-10 days PT Treatment/Interventions: Ambulation/gait training;Discharge planning;Functional mobility training;Psychosocial support;Therapeutic Activities;Visual/perceptual remediation/compensation;Balance/vestibular training;Disease management/prevention;Neuromuscular re-education;Skin care/wound management;Therapeutic Exercise;Wheelchair propulsion/positioning;DME/adaptive equipment instruction;Cognitive remediation/compensation;Pain management;Splinting/orthotics;UE/LE Strength taining/ROM;Community  reintegration;Functional electrical stimulation;Patient/family education;Stair training;UE/LE Coordination activities PT Transfers Anticipated Outcome(s): mod I PT Locomotion Anticipated Outcome(s): supervision PT Recommendation Follow Up Recommendations: Outpatient PT Patient destination: Home Equipment  Recommended: Standard walker;To be determined;Wheelchair (measurements);Wheelchair cushion (measurements) Equipment Details: TBD   PT Evaluation Precautions/Restrictions Precautions Precautions: Fall Precaution Comments: R-hemipleiga (UE>LE) Restrictions Weight Bearing Restrictions: No Pain Interference Pain Interference Pain Effect on Sleep: 1. Rarely or not at all Pain Interference with Therapy Activities: 1. Rarely or not at all Pain Interference with Day-to-Day Activities: 1. Rarely or not at all Home Living/Prior Functioning Home Living Living Arrangements: Children Available Help at Discharge: Family;Available 24 hours/day Type of Home: House Home Access: Stairs to enter;Level entry (level entry patio entrance if pt walks through the grass, paved entrance 3 step to entry with 2 handrails, can not reach simultaneously) Entrance Stairs-Number of Steps: 3 Entrance Stairs-Rails: Right;Left (unable to reach both simultaneously) Home Layout: One level Bathroom Shower/Tub: Engineer, manufacturing systems: Standard (pt used to have BSC but no longer does, it was donated) Bathroom Accessibility: Yes Additional Comments: pt has equipment from husband but has not received any on her own.  Lives With: Daughter Mackenzie Land) Prior Function Level of Independence: Independent with basic ADLs;Independent with homemaking with ambulation;Needs assistance with gait  Able to Take Stairs?: Yes Driving: Yes Vocation: Retired Vision/Perception  Vision - History Ability to See in Adequate Light: 0 Adequate Perception Perception: Impaired Inattention/Neglect: Does not attend to right side of  body;Impaired-to be further tested in functional context Praxis Praxis: Intact  Cognition Overall Cognitive Status: Within Functional Limits for tasks assessed Arousal/Alertness: Awake/alert Orientation Level: Oriented X4 Memory: Impaired Memory Impairment: Retrieval deficit Awareness: Appears intact Problem Solving: Appears intact Safety/Judgment: Appears intact Sensation Sensation Light Touch: Impaired by gross assessment Hot/Cold: Appears Intact Proprioception: Appears Intact Additional Comments: Pt able to detect light touch on all dermatomes in LE but reports diminished sensation on R LE compared to L Coordination Gross Motor Movements are Fluid and Coordinated: No Fine Motor Movements are Fluid and Coordinated: No Coordination and Movement Description: Decreased due to R-hemiplegia. Motor  Motor Motor: Hemiplegia Motor - Skilled Clinical Observations: R-hemi (UE>LE)   Trunk/Postural Assessment  Cervical Assessment Cervical Assessment: Within Functional Limits Thoracic Assessment Thoracic Assessment: Within Functional Limits Lumbar Assessment Lumbar Assessment: Within Functional Limits Postural Control Postural Control: Deficits on evaluation Righting Reactions: delayed  Balance Balance Balance Assessed: Yes Static Sitting Balance Static Sitting - Balance Support: Feet supported Static Sitting - Level of Assistance: 5: Stand by assistance (supervision) Dynamic Sitting Balance Dynamic Sitting - Balance Support: During functional activity Dynamic Sitting - Level of Assistance: 5: Stand by assistance Dynamic Sitting - Balance Activities: Lateral lean/weight shifting;Forward lean/weight shifting Static Standing Balance Static Standing - Balance Support: During functional activity;No upper extremity supported Static Standing - Level of Assistance: 5: Stand by assistance (CGA) Dynamic Standing Balance Dynamic Standing - Balance Support: Right upper extremity  supported Dynamic Standing - Level of Assistance: 4: Min assist Dynamic Standing - Balance Activities: Lateral lean/weight shifting;Forward lean/weight shifting Dynamic Standing - Comments: stand pivot transfer and gait Extremity Assessment  RUE Assessment RUE Assessment: Exceptions to [facility] RUE Body System: Neuro Brunstrum levels for arm and hand: Arm;Hand LUE Assessment LUE Assessment: Within Functional Limits RLE Assessment RLE Assessment: Exceptions to [facility] Passive Range of Motion (PROM) Comments: Pt R knee flexion limited 2/2 arthristis Active Range of Motion (AROM) Comments: Pt R knee flexion limited 2/2 arthritis, dorsiflexion limited to neutral General Strength Comments: grossly 3+/5 LLE Assessment LLE Assessment: Within Functional Limits  Care Tool Care Tool Bed Mobility Roll left and right activity   Roll left and right assist level: Supervision/Verbal cueing  Sit to lying activity   Sit to lying assist level: Supervision/Verbal cueing    Lying to sitting on side of bed activity   Lying to sitting on side of bed assist level: the ability to move from lying on the back to sitting on the side of the bed with no back support.: Supervision/Verbal cueing     Care Tool Transfers Sit to stand transfer   Sit to stand assist level: Minimal Assistance - Patient > 75%    Chair/bed transfer   Chair/bed transfer assist level: Minimal Assistance - Patient > 75%     Toilet transfer   Assist Level: Minimal Assistance - Patient > 75%    Car transfer   Car transfer assist level: Minimal Assistance - Patient > 75%      Care Tool Locomotion Ambulation   Assist level: Minimal Assistance - Patient > 75% Assistive device: No Device    Walk 10 feet activity   Assist level: Minimal Assistance - Patient > 75% Assistive device: No Device   Walk 50 feet with 2 turns activity   Assist level: Minimal Assistance - Patient > 75% Assistive device: No Device  Walk 150 feet activity    Assist level: Minimal Assistance - Patient > 75% Assistive device: No Device  Walk 10 feet on uneven surfaces activity   Assist level: Minimal Assistance - Patient > 75% Assistive device: Other (comment) (no device)  Stairs   Assist level: Minimal Assistance - Patient > 75% Stairs assistive device: 2 hand rails Max number of stairs: 4  Walk up/down 1 step activity   Walk up/down 1 step (curb) assist level: Minimal Assistance - Patient > 75% Walk up/down 1 step or curb assistive device: 2 hand rails  Walk up/down 4 steps activity   Walk up/down 4 steps assist level: Minimal Assistance - Patient > 75% Walk up/down 4 steps assistive device: 2 hand rails  Walk up/down 12 steps activity Walk up/down 12 steps activity did not occur: Safety/medical concerns      Pick up small objects from floor   Pick up small object from the floor assist level: Moderate Assistance - Patient 50 - 74% Pick up small object from the floor assistive device: no AD  Wheelchair Is the patient using a wheelchair?: Yes Type of Wheelchair: Manual   Wheelchair assist level: Total Assistance - Patient < 25%    Wheel 50 feet with 2 turns activity   Assist Level: Total Assistance - Patient < 25%  Wheel 150 feet activity   Assist Level: Total Assistance - Patient < 25%    Refer to Care Plan for Long Term Goals  SHORT TERM GOAL WEEK 1 PT Short Term Goal 1 (Week 1): STG=LTG 2/2 ELOS  Recommendations for other services: None   Skilled Therapeutic Intervention Mobility Bed Mobility Bed Mobility: Rolling Right;Rolling Left;Supine to Sit;Sit to Supine Rolling Right: Supervision/verbal cueing Rolling Left: Supervision/Verbal cueing Supine to Sit: Supervision/Verbal cueing Sit to Supine: Supervision/Verbal cueing Transfers Transfers: Sit to Stand;Stand to Sit;Stand Pivot Transfers Sit to Stand: Minimal Assistance - Patient > 75% Stand to Sit: Minimal Assistance - Patient > 75% Stand Pivot Transfers: Minimal  Assistance - Patient > 75% Transfer (Assistive device): None Locomotion  Gait Ambulation: Yes Gait Assistance: Minimal Assistance - Patient > 75% Gait Distance (Feet): 150 Feet Assistive device: None Gait Assistance Details: Verbal cues for gait pattern;Tactile cues for weight shifting;Verbal cues for technique Gait Gait: Yes Gait Pattern: Impaired Gait Pattern: Decreased stance time - right;Trendelenburg;Decreased  dorsiflexion - right;Step-to pattern;Decreased stride length;Decreased weight shift to right;Decreased step length - right;Decreased step length - left;Poor foot clearance - right Gait velocity: slow Stairs / Additional Locomotion Stairs: Yes Stairs Assistance: Minimal Assistance - Patient > 75% Stair Management Technique: Two rails Number of Stairs: 4 Wheelchair Mobility Wheelchair Mobility: Yes Wheelchair Assistance: Total Assistance - Patient <25% Wheelchair Propulsion: Left upper extremity Wheelchair Parts Management: Needs assistance   Discharge Criteria: Patient will be discharged from PT if patient refuses treatment 3 consecutive times without medical reason, if treatment goals not met, if there is a change in medical status, if patient makes no progress towards goals or if patient is discharged from hospital.  The above assessment, treatment plan, treatment alternatives and goals were discussed and mutually agreed upon: by patient and by family  Todays Interventions  Pt seated in [facility] upon arrival with daughter in room. Pt agreeable to therapy. Pt denies any pain. Evaluation completed (see details above and below) with education on PT POC and goals and individual treatment initiated with focus on attention to R UE with transfers and functional mobility. Education on PT evaluation, CIR policies and therapy schedule.  Discussed home equipment, pt reports all equipment is from late husband, not dispensed to her, and majority of it has been donated prior to pt stroke.    Pt demos R UE inattention while seated in WC and with transfers and locomotion, requiring hand over hand assist and verbal and tactile cues to improve attention to R UE. Pt requires min A for stability with transfers and locomotion with no AD. Plan to perform BERG balance assessment next session.   Pt reports whooziness with stair navigation and rolling in bed, seated BP 126/74 HR 74, standing BP 107/69 HR 82. Plan to donn ted hose next session.   Pt seated in WC at end of session with all needs within reach and seatbelt alarm on with daughter in room.    [facility] [place], [place], DPT  03/29/2023, 12:54 PM

## 2023-03-29 NOTE — Evaluation (Signed)
Speech Language Pathology Assessment and Plan  Patient Details  Name: [name] MRN: [id number] Date of Birth: 12-20-1930  SLP Diagnosis: Cognitive Impairments  Rehab Potential: Excellent ELOS: 7-10 days   Today's Date: 03/29/2023 SLP Individual Time: 1300-1400 SLP Individual Time Calculation (min): 60 min   Hospital Problem: Principal Problem:   CVA (cerebral vascular accident) [facility])  Past Medical History:  Past Medical History:  Diagnosis Date   Arthritis    Arthropathy, unspecified, site unspecified    Dr. Noel Gerold...right clavicular head swelling 2003   Colon polyp    colonoscopy 06-24-06 with no adenomatous change, hyperplasia  only   Environmental allergies    GERD (gastroesophageal reflux disease)    Healthcare maintenance    CPX 12-24-09, Td 1/05, pneumovax 2000 age 84, GYN [name] ([place])   Hyperlipidemia    target less than 160 > try off muscle aches 03-18-10   Morbid obesity (HCC)    ideal = 142.  target wt = 158 for BMI < 30   Osteopenia    BMD 08-07-08 tspin - 1.2, left femur - .4, right femur +.2.  repeat 01-02-10  1.9    0.1   0.5   Rhinitis, chronic    Dr. Linward Foster...on immunotherapy since 5/08   Past Surgical History:  Past Surgical History:  Procedure Laterality Date   BUNIONECTOMY  1982   bilateral   CHOLECYSTECTOMY N/A 07/17/2020   Procedure: LAPAROSCOPIC CHOLECYSTECTOMY;  Surgeon: Axel Filler, MD;  Location: [facility] OR;  Service: General;  Laterality: N/A;   FOOT SURGERY  1970   bilateral 5th toes, bones removed   GANGLION CYST EXCISION  2000   3rd finger right hand   TUBAL LIGATION  1971    Assessment / Plan / Recommendation Clinical Impression HPI: [name] is a 89 year old female presented to [facility] health emergency department at [facility] on 03/24/2023 complaining of worsening right upper arm weakness. She had been reporting the symptoms for approximately 2 months per family report. She was recently treated for  cervical spondylolisthesis and radiculopathy as well as dizziness. First son stated 11 hours prior to admission her right arm weakness worsened significantly. Brain revealed scattered acute to subacute patchy ischemic infarctions evolving cortical and subcortical aspect of the left frontal and parietal lobes. No hemorrhage noted. She is tolerating regular texture diet.   Clinical Impression: Patient was administered the Cognistat to evaluate cognitive-linguistic function. Patient reports she independently managed finances, medications and bills prior to admission and feels as if her cognition has declined since diagnosis. Patient presents with [facility] orientation, attention, registration, comprehension, naming, memory, calculation, and reasoning/executive functioning this date. She demonstrates a moderate impairment in visual construction tasks though reports feeling "nervous" because she was being timed during this portion of the evaluation. Patient 100% intelligible at the conversational level throughout the session. Recommend skilled ST intervention to further assess high level problem solving abilities such as medication and financial management since patient reports independence as PLOF and cognitive changes since admission. Anticipate patient will require supervision from family/friends upon d/c home and f/u OP SLP services to continue to address functional cognitive skills.    Skilled Therapeutic Interventions          Patient was administered a standardized assessment to assess cognitive-linguistic function. See above for details.   SLP Assessment  Patient will need skilled Speech Lanaguage Pathology Services during CIR admission    Recommendations  Patient destination: Home Follow up Recommendations: Outpatient SLP Equipment Recommended:  None recommended by SLP    SLP Frequency 1 to 3 out of 7 days   SLP Duration  SLP Intensity  SLP Treatment/Interventions 7-10 days  Minumum of 1-2 x/day, 30  to 90 minutes  Cognitive remediation/compensation;Internal/external aids;Cueing hierarchy;Therapeutic Activities;Functional tasks;Patient/family education;Therapeutic Exercise    Pain Pain Assessment Pain Scale: Faces Faces Pain Scale: No hurt  Prior Functioning Type of Home: House  Lives With: Daughter Mackenzie Land) Available Help at Discharge: Family;Available 24 hours/day Vocation: Retired  Architectural technologist Overall Cognitive Status: Impaired/Different from baseline Arousal/Alertness: Awake/alert Orientation Level: Oriented X4 Year: 2024 Month: July Day of Week: Correct Selective Attention: Appears intact Memory: Impaired Memory Impairment: Decreased short term memory Decreased Short Term Memory: Verbal complex;Functional complex Awareness: Appears intact Problem Solving: Impaired Problem Solving Impairment: Verbal complex;Functional complex Safety/Judgment: Appears intact  Comprehension Auditory Comprehension Overall Auditory Comprehension: Appears within functional limits for tasks assessed Expression Expression Primary Mode of Expression: Verbal Verbal Expression Overall Verbal Expression: Appears within functional limits for tasks assessed Oral Motor Oral Motor/Sensory Function Overall Oral Motor/Sensory Function: Within functional limits Motor Speech Overall Motor Speech: Appears within functional limits for tasks assessed Respiration: Within functional limits Phonation: Normal Resonance: Within functional limits Articulation: Within functional limitis Intelligibility: Intelligible Motor Planning: Witnin functional limits Motor Speech Errors: Not applicable  Care Tool Care Tool Cognition Ability to hear (with hearing aid or hearing appliances if normally used Ability to hear (with hearing aid or hearing appliances if normally used): 0. Adequate - no difficulty in normal conservation, social interaction, listening to TV   Expression of Ideas and Wants  Expression of Ideas and Wants: 4. Without difficulty (complex and basic) - expresses complex messages without difficulty and with speech that is clear and easy to understand   Understanding Verbal and Non-Verbal Content Understanding Verbal and Non-Verbal Content: 4. Understands (complex and basic) - clear comprehension without cues or repetitions  Memory/Recall Ability Memory/Recall Ability : Location of own room;Staff names and faces;That he or she is in a hospital/hospital unit;Current season   Short Term Goals: Week 1: SLP Short Term Goal 1 (Week 1): STG = LTG due to ELOS  Refer to Care Plan for Long Term Goals  Recommendations for other services: None   Discharge Criteria: Patient will be discharged from SLP if patient refuses treatment 3 consecutive times without medical reason, if treatment goals not met, if there is a change in medical status, if patient makes no progress towards goals or if patient is discharged from hospital.  The above assessment, treatment plan, treatment alternatives and goals were discussed and mutually agreed upon: by patient and by family  Arriel Victor M.A., CF-SLP 03/29/2023, 2:24 PM

## 2023-03-29 NOTE — Progress Notes (Signed)
Patient ID: [name], female   DOB: 1931-06-28, 89 y.o.   MRN: [id number] Met with the patient and daughter to review current situation, rehab process, team conference and plan of care.  Discussed secondary risk management including HTN, HLD (LDL 131/Trig 86) on Crestor, and prediabetes (A1C 6.0) with dioetary modification recommendations. Reviewed medications including DAPT x 3 months then ASA solo. Given information on stenosis (pred taper) and Vit D with Vit B 12. Continue to follow along to address educational needs to facilitate preparation for discharge. Patient notes if appropriate, would like to attend OP therapy in [facility]; previous patient there. Pamelia Hoit

## 2023-03-29 NOTE — Progress Notes (Signed)
PROGRESS NOTE   Subjective/Complaints: Patient working with therapy this morning.  Her daughter is in the room.  Patient does report some chronic knee pain on the right.  She has declined steroid injection because she does not like needles.  She did recently have oral steroids completed.  ROS: Patient denies fever, rash, sore throat, blurred vision, dizziness, nausea, vomiting, diarrhea, cough, shortness of breath or chest pain, back/neck pain, headache, or mood change. + R knee pain   Objective:   No results found. No results for input(s): "WBC", "HGB", "HCT", "PLT" in the last 72 hours. No results for input(s): "NA", "K", "CL", "CO2", "GLUCOSE", "BUN", "CREATININE", "CALCIUM" in the last 72 hours.  Intake/Output Summary (Last 24 hours) at 03/29/2023 0820 Last data filed at 03/28/2023 1629 Gross per 24 hour  Intake --  Output 200 ml  Net -200 ml        Physical Exam: Vital Signs Blood pressure 125/60, pulse 72, temperature 98.6 F (37 C), resp. rate 16, height 5\' 2"  (1.575 m), weight 61.5 kg, SpO2 97%.  Physical Exam   General: No apparent distress HEENT: Head is normocephalic, atraumatic, MMM Neck: Supple without JVD or lymphadenopathy Heart: Reg rate and rhythm. Chest: CTA bilaterally without wheezes, rales, or rhonchi; no distress Abdomen: Soft, non-tender, non-distended, bowel sounds positive. Extremities: No clubbing, cyanosis, or edema. Pulses are 2+ Psych: Pt's affect is appropriate. Pt is cooperative and very pleasant Skin: Clean and intact without signs of breakdown Neuro:     Mental Status: She is alert and awake    Comments: Pt alert and oriented. Reasonable insight and awareness. Normal language and speech. Functional memory. Mild right central 7. MMT: RUE 3- to 3/5 prox to 2+ to 3-/5 distally. RLE 4/5 prox to distal. LUE and LLE 4+ to 5/5. +PD RUE.  No abnormal resting tone noted Musculoskeletal:   Right shoulder tenderness with IR/ER. RA deformities of both hands/ulnar deviation  R Knee tenderness to palpation     Assessment/Plan: 1. Functional deficits which require 3+ hours per day of interdisciplinary therapy in a comprehensive inpatient rehab setting. Physiatrist is providing close team supervision and 24 hour management of active medical problems listed below. Physiatrist and rehab team continue to assess barriers to discharge/monitor patient progress toward functional and medical goals  Care Tool:  Bathing              Bathing assist       Upper Body Dressing/Undressing Upper body dressing        Upper body assist      Lower Body Dressing/Undressing Lower body dressing            Lower body assist       Toileting Toileting    Toileting assist       Transfers Chair/bed transfer  Transfers assist           Locomotion Ambulation   Ambulation assist              Walk 10 feet activity   Assist           Walk 50 feet activity   Assist  Walk 150 feet activity   Assist           Walk 10 feet on uneven surface  activity   Assist           Wheelchair     Assist               Wheelchair 50 feet with 2 turns activity    Assist            Wheelchair 150 feet activity     Assist          Blood pressure 125/60, pulse 72, temperature 98.6 F (37 C), resp. rate 16, height 5\' 2"  (1.575 m), weight 61.5 kg, SpO2 97%.  Medical Problem List and Plan: 1. Functional deficits secondary to left frontal and parietal lobe infarcts small vessel disease vs cardio-embolic due to A-fib.             -neurology recommends 30-day event monitor---set up with [facility] cardiology as outpt             -patient may  shower             -ELOS/Goals: 7-10 days, min assist to supervision goals   -Continue CIR, team conference tomorrow  -Resting hand splint ordered  2.   Antithrombotics: -DVT/anticoagulation:  Pharmaceutical: Heparin             -antiplatelet therapy: Aspirin and Plavix for three months and then asa alone   3. Pain Management: Tylenol as needed, Voltaren gel to right knee as needed (she asks for these before therapy sessions). Also wants k-pad.   4. 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 Neuropsych/cognition: This patient is capable of making decisions on her own behalf.   6. Skin/Wound Care: Routine skin care checks   7. Fluids/Electrolytes/Nutrition: Routine Is and Os and follow-up chemistries   8: Hypertension: monitor TID and prn             -no home meds/avoid hypotension     03/29/2023    6:48 AM 03/28/2023   10:12 PM 03/28/2023    3:21 PM  Vitals with BMI  Systolic 125 110 295  Diastolic 60 55 69  Pulse 72 63 72      9: Prediabetes:  A1c = 6.0%; blood glucose in range currently             -carb modified diet   10: Hyperlipidemia: continue statin    11.  Orthostatic hypotension  -Start TED hose  12.  Right knee OA  -7/23 patient declines knee injection today, continue Voltaren.  She reports that she has a knee brace that helps  13.  Azotemia  -Creatinine slightly above her usual at 1.01 today, encourage oral fluid intake  LOS: 1 days A FACE TO FACE EVALUATION WAS PERFORMED  Fanny Dance 03/29/2023, 8:20 AM

## 2023-03-29 NOTE — Progress Notes (Signed)
Orthopedic Tech Progress Note Patient Details:  [name] 1931/01/03 [id number]  Called In order to [employer] [facility] [employer]    Patient ID: [name], female   DOB: 1930-12-16, 89 y.o.   MRN: [id number]  [name] 03/29/2023, 8:31 AM

## 2023-03-29 NOTE — Plan of Care (Signed)
  Problem: RH Problem Solving Goal: LTG Patient will demonstrate problem solving for (SLP) Description: LTG:  Patient will demonstrate problem solving for basic/complex daily situations with cues  (SLP) Flowsheets (Taken 03/29/2023 1409) LTG: Patient will demonstrate problem solving for (SLP): Complex daily situations LTG Patient will demonstrate problem solving for: Modified Independent

## 2023-03-30 DIAGNOSIS — K5900 Constipation, unspecified: Secondary | ICD-10-CM

## 2023-03-30 MED ORDER — POLYETHYLENE GLYCOL 3350 17 G PO PACK
17.0000 g | PACK | Freq: Every day | ORAL | Status: DC
  Administered 2023-03-30 – 2023-04-05 (×6): 17 g via ORAL
  Filled 2023-03-30 (×6): qty 1

## 2023-03-30 MED ORDER — TRAZODONE HCL 50 MG PO TABS: 25.0000 mg | ORAL_TABLET | Freq: Every evening | ORAL | Status: DC | PRN

## 2023-03-30 NOTE — Progress Notes (Signed)
Speech Language Pathology Daily Session Note  Patient Details  Name: [name] MRN: [id number] Date of Birth: [birth year]  Today's Date: 03/30/2023 SLP Individual Time: 0800-0900 SLP Individual Time Calculation (min): 60 min  Short Term Goals: Week 1: SLP Short Term Goal 1 (Week 1): STG = LTG due to ELOS  Skilled Therapeutic Interventions: Skilled therapy session focused on cognitive re-training. SLP facilitated session by reviewing WRAP (writing, repeat, associate, picture) memory strategies and utilizing them to recall care team names with supervision A. SLP addressed problem solving goals through completion of calendar task. Patient utilized supervision A to complete calendar with bills/events and answered comprehension questions with 100% accuracy and mod I assist. SLP wrote on calendar due to physical limitations of patient, however patient able to instruct SLP on what to write and where. Patient left in [facility] with alarm set and call bell in reach. Continue POC.   Pain Pain Assessment Pain Scale: 0-10 Pain Score: 0-No pain  Therapy/Group: Individual Therapy  [name] M.A., CF-SLP 03/30/2023, 11:56 AM

## 2023-03-30 NOTE — Patient Care Conference (Signed)
Inpatient RehabilitationTeam Conference and Plan of Care Update Date: 03/30/2023   Time: 12:02 PM    Patient Name: [name]      Medical Record Number: [id number]  Date of Birth: 1930-12-12 Sex: Female         Room/Bed: 4M12C/4M12C-01 Payor Info: Payor: [employer] MEDICARE / Plan: [facility] MEDICARE / Product Type: *No Product type* /    Admit Date/Time:  03/28/2023  3:15 PM  Primary Diagnosis:  CVA (cerebral vascular accident) [facility])  Hospital Problems: Principal Problem:   CVA (cerebral vascular accident) [facility])    Expected Discharge Date: Expected Discharge Date: 04/05/23  Team Members Present: Physician leading conference: Dr. Fanny Dance Social Worker Present: Dossie Der, LCSW Nurse Present: Chana Bode, RN PT Present: Ambrose Finland, PT OT Present: Lou Cal, OT SLP Present: Everardo Pacific, SLP PPS Coordinator present : Fae Pippin, SLP     Current Status/Progress Goal Weekly Team Focus  Bowel/Bladder   Pt is continent of bowel/bladder   Pt will remain continent of bowel/bladder   Will assess qshift and PRN    Swallow/Nutrition/ Hydration               ADL's   CGA-Min A overall   Mod I for self-feeding, Supervision for all other ADLs and transfers   RUE NMR and R inattention    Mobility   superivsion bed mobility, sit to stand CGA with RW, stand pivot transfer CGA with RW, gait x150 feet with RW and CGA, stair navigation x4 steps with B HR and min A for R UE inattention. Orthostatic hypotension, complaints of dizziness   mod I/sup  decreasing R inattention, standing balance, safety with RW    Communication                Safety/Cognition/ Behavioral Observations  supervision A   mod I   problem solving in complex daily situations (meds, bills, appointments)    Pain   Pt denies pain   Pt will continue to deny pain   Will assess qshift and PRN    Skin   Pt's skin is intact   Pt's skin will remain intact  Will  assess qshift and PRN      Discharge Planning:    Home with daughter  Team Discussion: Patient post CVA with right inattention, problem solving deficits, and orthostasis. Chronic knee and hand pain.  Patient on target to meet rehab goals: yes, currently needs min assist for ADLs. Completes sit - stand transfers using a RW with CGA and able to ambulate up to 150' using a RW and manage steps with bilateral rails.  Goals for discharge set for mod I - supervision overall.  *See Care Plan and progress notes for long and short-term goals.   Revisions to Treatment Plan:  Rollator trial Wrist splint   Teaching Needs: Safety, medications, dietary modification, transfers, toileting, etc.   Current Barriers to Discharge: Decreased caregiver support and Home enviroment access/layout  Possible Resolutions to Barriers: Family education Follow up with PCP in 1-2 weeks  Neurology to schedule follow-up  OP follow up services DME; W/C and RW      Medical Summary Current Status: CVA, orthosatic hypotension, knee pain, constipation  Barriers to Discharge: Medical stability  Barriers to Discharge Comments: CVA, orthosatic hypotension, knee pain, constipation Possible Resolutions to [employer] Focus: abdominal binder, encouarge fluids, monitor labs, monitor BP   Continued Need for Acute Rehabilitation Level of Care: The patient requires daily medical management by a physician  with specialized training in physical medicine and rehabilitation for the following reasons: Direction of a multidisciplinary physical rehabilitation program to maximize functional independence : Yes Medical management of patient stability for increased activity during participation in an intensive rehabilitation regime.: Yes Analysis of laboratory values and/or radiology reports with any subsequent need for medication adjustment and/or medical intervention. : Yes   I attest that I was present, lead the team  conference, and concur with the assessment and plan of the team.   Chana Bode B 03/30/2023, 3:49 PM

## 2023-03-30 NOTE — Progress Notes (Signed)
Orthopedic Tech Progress Note Patient Details:  [name] 11-26-1930 [id number]  RN/LPN called requesting an ABDOMINAL BINDER, patient was working with PT/OT at the time I came to room. Daughter at bedside   Ortho Devices Type of Ortho Device: Abdominal binder Ortho Device/Splint Location: STOMACH Ortho Device/Splint Interventions: Ordered   Post Interventions Patient Tolerated: Well Instructions Provided: Care of device  [name] 03/30/2023, 11:26 AM

## 2023-03-30 NOTE — Progress Notes (Signed)
Occupational Therapy Session Note  Patient Details  Name: [name] MRN: [id number] Date of Birth: Feb 27, 1931  Today's Date: 03/30/2023 OT Individual Time: 0865-7846 OT Individual Time Calculation (min): 42 min    Short Term Goals: Week 1:  OT Short Term Goal 1 (Week 1): STGs=LTGs due to patient's length of stay.  Skilled Therapeutic Interventions/Progress Updates:  Pt received from direct PT handoff for skilled OT session with focus on BADL participation. Pt agreeable to interventions, demonstrating overall pleasant mood. Pt with no reports of pain throughout session. OT offering intermediate rest breaks and positioning suggestions throughout session to address potential pain/fatigue and maximize participation/safety in session.   Pt's RW adapted with saddle splint for improved RUE grip. Pt performs all functional transfers with CGA + RW, ambulating into/out of bathroom with same level of assistance. Pt performs bathing/dressing tasks with overall Min A for integration of RUE and compensation for decreased R-hand coordination. VSS stable post-activity.   Pt remained resting in bed with all immediate needs met at end of session. Pt continues to be appropriate for skilled OT intervention to promote further functional independence.   Therapy Documentation Precautions:  Precautions Precautions: Fall Precaution Comments: R-hemipleiga (UE>LE) Restrictions Weight Bearing Restrictions: No   Therapy/Group: Individual Therapy  Lou Cal, OTR/L, MSOT  03/30/2023, 6:02 AM

## 2023-03-30 NOTE — Progress Notes (Signed)
PROGRESS NOTE   Subjective/Complaints: [name] reports pain in her hands and knees doing a lot better today.  She has had orthostatic hypotension while working with therapy with some dizziness reported.  ROS: Patient denies fever, rash, sore throat, blurred vision, nausea, vomiting, diarrhea, cough, shortness of breath or chest pain, back/neck pain, headache, or mood change. + R knee pain + Dizziness + constipation  Objective:   No results found. Recent Labs    03/29/23 0732  WBC 5.5  HGB 12.7  HCT 38.4  PLT 216   Recent Labs    03/29/23 0732  NA 135  K 4.1  CL 105  CO2 21*  GLUCOSE 113*  BUN 19  CREATININE 1.01*  CALCIUM 9.0    Intake/Output Summary (Last 24 hours) at 03/30/2023 0841 Last data filed at 03/30/2023 0804 Gross per 24 hour  Intake 480 ml  Output --  Net 480 ml        Physical Exam: Vital Signs Blood pressure (!) 114/58, pulse 73, temperature 98.4 F (36.9 C), temperature source Oral, resp. rate 16, height 5\' 2"  (1.575 m), weight 61.5 kg, SpO2 100%.  Physical Exam   General: No apparent distress, sitting in chair HEENT: Head is normocephalic, atraumatic, MMM Neck: Supple without JVD or lymphadenopathy Heart: Reg rate and rhythm. Chest: CTA bilaterally without wheezes, rales, or rhonchi; no distress Abdomen: Soft, non-tender, non-distended, bowel sounds positive. Extremities: No clubbing, cyanosis, or edema. Pulses are 2+ Psych: Pt's affect is appropriate. Pt is cooperative and very pleasant Skin: Clean and intact without signs of breakdown Neuro:     Mental Status: She is alert and awake    Comments: Pt alert and oriented. Reasonable insight and awareness. Normal language and speech. Functional memory. Mild right central 7. MMT: RUE 3- to 3/5 prox to 2+ to 3-/5 distally. RLE 4/5 prox to distal. LUE and LLE 4+ to 5/5. +PD RUE.  No abnormal resting tone noted Musculoskeletal:  Right  shoulder tenderness with IR/ER. RA deformities of both hands/ulnar deviation  R Knee tenderness to palpation -improved    Assessment/Plan: 1. Functional deficits which require 3+ hours per day of interdisciplinary therapy in a comprehensive inpatient rehab setting. Physiatrist is providing close team supervision and 24 hour management of active medical problems listed below. Physiatrist and rehab team continue to assess barriers to discharge/monitor patient progress toward functional and medical goals  Care Tool:  Bathing    Body parts bathed by patient: Right arm, Left arm, Chest, Abdomen, Front perineal area, Buttocks, Right upper leg, Left upper leg, Right lower leg, Left lower leg, Face         Bathing assist Assist Level: Contact Guard/Touching assist     Upper Body Dressing/Undressing Upper body dressing   What is the patient wearing?: Pull over shirt    Upper body assist Assist Level: Minimal Assistance - Patient > 75%    Lower Body Dressing/Undressing Lower body dressing      What is the patient wearing?: Underwear/pull up, Pants     Lower body assist Assist for lower body dressing: Minimal Assistance - Patient > 75%     Toileting Toileting Toileting Activity did not  occur Press photographer and hygiene only): N/A (no void or bm)  Toileting assist       Transfers Chair/bed transfer  Transfers assist     Chair/bed transfer assist level: Minimal Assistance - Patient > 75%     Locomotion Ambulation   Ambulation assist      Assist level: Minimal Assistance - Patient > 75% Assistive device: No Device     Walk 10 feet activity   Assist     Assist level: Minimal Assistance - Patient > 75% Assistive device: No Device   Walk 50 feet activity   Assist    Assist level: Minimal Assistance - Patient > 75% Assistive device: No Device    Walk 150 feet activity   Assist    Assist level: Minimal Assistance - Patient > 75% Assistive  device: No Device    Walk 10 feet on uneven surface  activity   Assist     Assist level: Minimal Assistance - Patient > 75% Assistive device: Other (comment) (no device)   Wheelchair     Assist Is the patient using a wheelchair?: Yes Type of Wheelchair: Manual    Wheelchair assist level: Total Assistance - Patient < 25%      Wheelchair 50 feet with 2 turns activity    Assist        Assist Level: Total Assistance - Patient < 25%   Wheelchair 150 feet activity     Assist      Assist Level: Total Assistance - Patient < 25%   Blood pressure (!) 114/58, pulse 73, temperature 98.4 F (36.9 C), temperature source Oral, resp. rate 16, height 5\' 2"  (1.575 m), weight 61.5 kg, SpO2 100%.  Medical Problem List and Plan: 1. Functional deficits secondary to left frontal and parietal lobe infarcts small vessel disease vs cardio-embolic due to A-fib.             -neurology recommends 30-day event monitor---set up with [facility] cardiology as outpt             -patient may  shower             -ELOS/Goals: 7-10 days, min assist to supervision goals  -Resting hand splint ordered  Team conference today please see physician documentation under team conference tab, met with team  to discuss problems,progress, and goals. Formulized individual treatment plan based on medical history, underlying problem and comorbidities.    2.  Antithrombotics: -DVT/anticoagulation:  Pharmaceutical: Heparin             -antiplatelet therapy: Aspirin and Plavix for three months and then asa alone   3. Pain Management: Tylenol as needed, Voltaren gel to right knee as needed (she asks for these before therapy sessions). Also wants k-pad.   4. 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 Neuropsych/cognition: This patient is capable of making decisions on  her own behalf.   6. Skin/Wound Care: Routine skin care checks   7. Fluids/Electrolytes/Nutrition: Routine Is and Os and follow-up chemistries   8: Hypertension: monitor TID and prn             -no home meds/avoid hypotension  -7/24 resting blood pressure appears stable, continue current regimen     03/30/2023    4:57 AM 03/29/2023  8:10 PM 03/29/2023    2:50 PM  Vitals with BMI  Systolic 114 140 578  Diastolic 58 67 63  Pulse 73 67 66      9: Prediabetes:  A1c = 6.0%; blood glucose in range currently             -carb modified diet   10: Hyperlipidemia: continue statin    11.  Orthostatic hypotension  -Start TED hose  -7/24 abdominal binder ordered, encourage oral fluid intake  12.  Right knee OA  -7/23 patient declines knee injection today, continue Voltaren.  She reports that she has a knee brace that helps  -7/24 pain doing better today, continue current regimen  13.  Azotemia  -Creatinine slightly above her usual at 1.01 today, encourage oral fluid intake  -7/24 continue to encourage fluid intake, recheck BMP tomorrow  14.  Constipation  -7/24 last BM recorded yesterday-volume not documented.  Scheduled MiraLAX, continue Senokot   LOS: 2 days A FACE TO FACE EVALUATION WAS PERFORMED  [name] 03/30/2023, 8:41 AM

## 2023-03-30 NOTE — Progress Notes (Signed)
Occupational Therapy Session Note  Patient Details  Name: [name] MRN: [id number] Date of Birth: [birth year]  Today's Date: 03/30/2023 OT Individual Time: 1300-1355 OT Individual Time Calculation (min): 55 min    Short Term Goals: Week 1:  OT Short Term Goal 1 (Week 1): STGs=LTGs due to patient's length of stay.  Skilled Therapeutic Interventions/Progress Updates:   Pt seen for skilled OT session with focus on activity tolerance, standing balance and VSR management for orthostatic response prevention. Pt's family present for session. New R resting hand splint delivered and training initiated. Posted written info and added nursing order for nightly application. Baseline BP EOB level 117/58, standing 4 min RW level supported with TED hose and abdominal binder applied 106/54, then 2nd stand 80/54 after marching and reaching for horse shoes briefly.  Returned to supine with final vitals 118/56. OT reinforced foam cube squeeze, ankle pumps and increased hydration. Left pt with bed alarm engaged, needs and nurse call button in reach.    Pain: mild R neck/UE pain 3/10 with Voltaren gel added to regimen with relief    Therapy Documentation Precautions:  Precautions Precautions: Fall Precaution Comments: R-hemipleiga (UE>LE) Restrictions Weight Bearing Restrictions: No  Therapy/Group: Individual Therapy  Vicenta Dunning 03/30/2023, 7:52 AM

## 2023-03-30 NOTE — Progress Notes (Addendum)
Physical Therapy Session Note  Patient Details  Name: [name] MRN: [id number] Date of Birth: May 06, 1931  Today's Date: 03/30/2023 PT Individual Time: 4010-2725 PT Individual Time Calculation (min): 62 min   Short Term Goals: Week 1:  PT Short Term Goal 1 (Week 1): STG=LTG 2/2 ELOS  Skilled Therapeutic Interventions/Progress Updates:      Pt seated in WC upon arrival. Pt agreeable to therapy. Pt denies any pain.   Pt Donned R LE knee brace pt has from home with mod A and ted hose with total A.   Vitals Assessed: BP in seated 126/55 HR 70, standing B 106/67 HR 69.   Ambulation with RW CGA, verbal cues provided for UE placement on RW-R LE cirumduction-verbal cues provided for hip and knee flexion for R LE advancement  Toe taps 2x10 B on 3 inch step with B UE support on RW, with empahsis on hip and knee flexion versus circumduction  Amb with no AD and min A 5x20 feet on mat to simulate grass entry for home entrance.   Pt performed the following balance activities to increase ankle strategy stood with feet apart 2x1 min and feet together 2x1 min on airex pad with intermittent CGA, min A for placement of feet together.   Discussed trialing Rollator tomorrow.   Pt seated in [facility] upon arrival with all needs within seatbelt alarm on.   Therapy Documentation Precautions:  Precautions Precautions: Fall Precaution Comments: R-hemipleiga (UE>LE) Restrictions Weight Bearing Restrictions: No  Therapy/Group: Individual Therapy  [facility] Ambrose Finland, [place], DPT  03/30/2023, 10:33 AM

## 2023-03-30 NOTE — Progress Notes (Signed)
Patient ID: [name], female   DOB: 11/12/1930, 89 y.o.   MRN: [id number]  Met with pt, son and daughter were present to give team conference update regarding goals of supervision-mod/I level and target discharge date of 7/30. All very pleased with her progress and she is trying to drink more fluids for her BP stabilization. Discussed OP and she prefers to go to [facility] where she has gone before. Will send the order and they will reach otu to schedule appointments with pt or daughter. Discussed awaiting equipment recommendations due to trying a rollator and will await PT recommendation. Will continue to work on discharge needs. Family to provide 24/7 supervision upon discharge to make sure doing well with transition home

## 2023-03-31 LAB — BASIC METABOLIC PANEL
Anion gap: 9 (ref 5–15)
BUN: 20 mg/dL (ref 8–23)
CO2: 23 mmol/L (ref 22–32)
Calcium: 9.6 mg/dL (ref 8.9–10.3)
Creatinine, Ser: 1.06 mg/dL — ABNORMAL HIGH (ref 0.44–1.00)
Glucose, Bld: 149 mg/dL — ABNORMAL HIGH (ref 70–99)
Potassium: 4.4 mmol/L (ref 3.5–5.1)
Sodium: 136 mmol/L (ref 135–145)

## 2023-03-31 MED ORDER — SERTRALINE HCL 25 MG PO TABS
12.5000 mg | ORAL_TABLET | Freq: Every day | ORAL | Status: DC
  Administered 2023-04-01 – 2023-04-05 (×5): 12.5 mg via ORAL
  Filled 2023-03-31 (×6): qty 0.5

## 2023-03-31 NOTE — Progress Notes (Signed)
Orthopedic Tech Progress Note Patient Details:  [name] 1930-10-08 [id number]  [facility] is aware of the routine order for a right AFO consult.  Patient ID: [name], female   DOB: 08/27/1931, 89 y.o.   MRN: [id number]  Docia Furl 03/31/2023, 2:40 PM

## 2023-03-31 NOTE — IPOC Note (Signed)
Overall Plan of Care [facility]) Patient Details Name: [name] MRN: [id number] DOB: 1930/12/22  Admitting Diagnosis: CVA (cerebral vascular accident) [facility])  Hospital Problems: Principal Problem:   CVA (cerebral vascular accident) [facility])     Functional Problem List: Nursing Bowel, Endurance, Medication Management, Safety  PT Balance, Behavior, Endurance, Motor, Pain, Safety, Sensory  OT Balance, Endurance, Pain  SLP Cognition  TR         Basic ADL's: OT Bathing, Dressing, Toileting, Eating, Grooming     Advanced  ADL's: OT Simple Meal Preparation, Light Housekeeping     Transfers: PT Bed Mobility, Bed to Chair, Customer service manager, Tub/Shower     Locomotion: PT Ambulation, Psychologist, prison and probation services, Stairs     Additional Impairments: OT Fuctional Use of Upper Extremity  SLP Social Cognition   Problem Solving  TR      Anticipated Outcomes Item Anticipated Outcome  Self Feeding Mod I  Swallowing      Basic self-care  Marketing executive Transfers Supervision  Bowel/Bladder  manage bowel w mod I assist  Transfers  mod I  Locomotion  supervision  Communication     Cognition  mod I  Pain  n/a  Safety/Judgment  manage w cues   Therapy Plan: PT Intensity: Minimum of 1-2 x/day ,45 to 90 minutes PT Frequency: 5 out of 7 days PT Duration Estimated Length of Stay: 7-10 days OT Intensity: Minimum of 1-2 x/day, 45 to 90 minutes OT Frequency: 5 out of 7 days OT Duration/Estimated Length of Stay: 7-10 days SLP Intensity: Minumum of 1-2 x/day, 30 to 90 minutes SLP Frequency: 1 to 3 out of 7 days SLP Duration/Estimated Length of Stay: 8   Team Interventions: Nursing Interventions Disease Management/Prevention, Medication Management, Discharge Planning, Bowel Management, Patient/Family Education  PT interventions Ambulation/gait training, Discharge planning, Functional mobility training, Psychosocial support, Therapeutic Activities,  Visual/perceptual remediation/compensation, Balance/vestibular training, Disease management/prevention, Neuromuscular re-education, Skin care/wound management, Therapeutic Exercise, Wheelchair propulsion/positioning, DME/adaptive equipment instruction, Cognitive remediation/compensation, Pain management, Splinting/orthotics, UE/LE Strength taining/ROM, Community reintegration, Development worker, international aid stimulation, Patient/family education, Museum/gallery curator, UE/LE Coordination activities  OT Interventions Warden/ranger, Cognitive remediation/compensation, Firefighter, Discharge planning, Disease mangement/prevention, Fish farm manager, Functional electrical stimulation, Functional mobility training, Neuromuscular re-education, Pain management, Patient/family education, Psychosocial support, Self Care/advanced ADL retraining, Skin care/wound managment, Splinting/orthotics, Therapeutic Activities, Therapeutic Exercise, UE/LE Strength taining/ROM, UE/LE Coordination activities, Visual/perceptual remediation/compensation, Wheelchair propulsion/positioning  SLP Interventions Cognitive remediation/compensation, Internal/external aids, Cueing hierarchy, Therapeutic Activities, Functional tasks, Patient/family education, Therapeutic Exercise  TR Interventions    SW/CM Interventions Discharge Planning, Psychosocial Support, Patient/Family Education   Barriers to Discharge MD  Medical stability  Nursing Decreased caregiver support, Home environment access/layout 2 level 3 ste main B+B w dtr; family will arrange 24/7 care  PT Decreased caregiver support, Home environment access/layout R hemi  OT Incontinence    SLP      SW       Team Discharge Planning: Destination: PT-Home ,OT- Home , SLP-Home Projected Follow-up: PT-Outpatient PT, OT-  Outpatient OT, SLP-Outpatient SLP Projected Equipment Needs: PT-Standard walker, To be determined, Wheelchair (measurements),  Wheelchair cushion (measurements), OT- To be determined, SLP-None recommended by SLP Equipment Details: PT-TBD, OT-  Patient/family involved in discharge planning: PT- Patient, Family Adult nurse,  OT-Patient, SLP-Patient, Family member/caregiver  MD ELOS: 7-10 Medical Rehab Prognosis:  Excellent Assessment: The patient has been admitted for CIR therapies with the diagnosis of left frontal and parietal lobe infarcts small vessel disease vs cardio-embolic due  to A-fib . The team will be addressing functional mobility, strength, stamina, balance, safety, adaptive techniques and equipment, self-care, bowel and bladder mgt, patient and caregiver education. Goals have been set at min A to sup. Anticipated discharge destination is home.     See Team Conference Notes for weekly updates to the plan of care

## 2023-03-31 NOTE — Progress Notes (Signed)
Speech Language Pathology Daily Session Note  Patient Details  Name: [name] MRN: [id number] Date of Birth: 08-21-1931  Today's Date: 03/31/2023 SLP Individual Time: 0800-0900 SLP Individual Time Calculation (min): 60 min  Short Term Goals: Week 1: SLP Short Term Goal 1 (Week 1): STG = LTG due to ELOS  Skilled Therapeutic Interventions: Skilled therapy session addressed cognitive goals. SLP facilitated session by providing mod I cues during medication management tasks. Patient used problem solving skills to review medication list and fill in 1x a day weekly pill box. Patient aware of mistakes and corrected independently. Patient able to orient to time by using a calendar as an external aid. Patient progressing towards functional written STG's. Patient left in [facility] with family present and call bell in reach. Continue POC.   Pain Pain Assessment Pain Scale: 0-10 Pain Score: 0-No pain  Therapy/Group: Individual Therapy  Lakera Viall M.A., CF-SLP 03/31/2023, 12:07 PM

## 2023-03-31 NOTE — Plan of Care (Signed)
  Problem: Health Behavior/Discharge Planning: Goal: Ability to manage health-related needs will improve Outcome: Progressing   

## 2023-03-31 NOTE — Progress Notes (Signed)
Patient ID: BEAUTY PLESS, female   DOB: [birth year], 89 y.o.   MRN: [id number]  Outpatient referral faxed to [facility] in [place]. Pt prefers this facility due to has been there before and close to her home. They will reach out and call daughter to set up follow up appointments.

## 2023-03-31 NOTE — Progress Notes (Signed)
Occupational Therapy Session Note  Patient Details  Name: [name] MRN: [id number] Date of Birth: November 05, 1930  Today's Date: 03/31/2023 OT Individual Time: 4098-1191 OT Individual Time Calculation (min): 39 min    Short Term Goals: Week 1:  OT Short Term Goal 1 (Week 1): STGs=LTGs due to patient's length of stay.  Skilled Therapeutic Interventions/Progress Updates:     Pt received sitting up in wc with DTR present in room presenting to be in good spirits receptive to skilled OT session reporting 0/10 pain- OT offering intermittent rest breaks, repositioning, and therapeutic support to optimize participation in therapy session. Focus this session BADL retraining with emphasis on increasing R UE functional use and incorporating compensatory strategies to increase Pt's independence. Pt completed functional mobility to bathroom using RW and transferred to toilet CGA using RW. Pt provided increase time on toilet d/t need for BM- continent  B&B documented in flowsheets. Pt able to perform 3/3 toileting tasks with CGA using RW for balance. Pt transferred into walk-in shower using RW and grab bars onto TTB with CGA and min verbal cues required for technique. Pt able to perform UB bathing with close supervision and LB bathing min A to wash bottom. Pt utilized R UE during functional activity ~25% of time to stabilize items. Following shower, Pt complete dressing tasks seated EOB with education provided on hemi-dressing technique and Pt verbalizing understanding. Pt able to don bra and shirt with min A for positioning over trunk. OT donned knee high TEDs total A for time management. Pt able to weave B LEs into pants using modified technique and stand to bring them to waist CGA for balance using RW. Donned socks and shoes total A for time management and energy conservation. Pt completed stand step EOB>wc using RW CGA. Education provided to Pt on orthostatic hypotension symptoms and management with both  verbalizing understanding. Pt was left resting in wc with call bell in reach, DTR present in room, and all needs met.    Therapy Documentation Precautions:  Precautions Precautions: Fall Precaution Comments: R-hemipleiga (UE>LE) Restrictions Weight Bearing Restrictions: No   Therapy/Group: Individual Therapy  Army Fossa 03/31/2023, 7:52 AM

## 2023-03-31 NOTE — Progress Notes (Addendum)
PROGRESS NOTE   Subjective/Complaints: [name] reports that her hand and knee pain is still doing better.  She would like to decrease her Zoloft to 12.5 mg due to concerns this could be contributing to dizziness.  ROS: Patient denies fever, rash, sore throat, blurred vision, nausea, vomiting, diarrhea, cough, shortness of breath or chest pain, back/neck pain, headache, or mood change. + R knee pain + Dizziness + constipation  Objective:   No results found. Recent Labs    03/29/23 0732  WBC 5.5  HGB 12.7  HCT 38.4  PLT 216   Recent Labs    03/29/23 0732 03/31/23 0820  NA 135 136  K 4.1 4.4  CL 105 104  CO2 21* 23  GLUCOSE 113* 149*  BUN 19 20  CREATININE 1.01* 1.06*  CALCIUM 9.0 9.6    Intake/Output Summary (Last 24 hours) at 03/31/2023 1302 Last data filed at 03/31/2023 7829 Gross per 24 hour  Intake 600 ml  Output --  Net 600 ml        Physical Exam: Vital Signs Blood pressure 127/60, pulse 70, temperature 98 F (36.7 C), resp. rate 16, height 5\' 2"  (1.575 m), weight 61.5 kg, SpO2 100%.  Physical Exam   General: No apparent distress, working with therapy  HEENT: Head is normocephalic, atraumatic, mucous membranes a little dry Neck: Supple without JVD or lymphadenopathy Heart: Reg rate and rhythm. Chest: CTA bilaterally without wheezes, rales, or rhonchi; no distress Abdomen: Soft, non-tender, non-distended, bowel sounds positive. Extremities: No clubbing, cyanosis, or edema. Pulses are 2+ Psych: Pt's affect is appropriate. Pt is cooperative and very pleasant Skin: Clean and intact without signs of breakdown Neuro:     Mental Status: She is alert and awake    Comments: Pt alert and oriented. Reasonable insight and awareness. Normal language and speech. Functional memory. Mild right central 7. MMT: RUE 3- to 3/5 prox to 2+ to 3-/5 distally. RLE 4/5 prox to distal. LUE and LLE 4+ to 5/5. +PD RUE.   No abnormal resting tone noted Musculoskeletal:  Right shoulder tenderness with IR/ER. RA deformities of both hands/ulnar deviation  R Knee tenderness to palpation -improved    Assessment/Plan: 1. Functional deficits which require 3+ hours per day of interdisciplinary therapy in a comprehensive inpatient rehab setting. Physiatrist is providing close team supervision and 24 hour management of active medical problems listed below. Physiatrist and rehab team continue to assess barriers to discharge/monitor patient progress toward functional and medical goals  Care Tool:  Bathing    Body parts bathed by patient: Right arm, Left arm, Chest, Abdomen, Front perineal area, Buttocks, Right upper leg, Left upper leg, Right lower leg, Left lower leg, Face         Bathing assist Assist Level: Contact Guard/Touching assist     Upper Body Dressing/Undressing Upper body dressing   What is the patient wearing?: Pull over shirt    Upper body assist Assist Level: Minimal Assistance - Patient > 75%    Lower Body Dressing/Undressing Lower body dressing      What is the patient wearing?: Underwear/pull up, Pants     Lower body assist Assist for lower body dressing:  Minimal Assistance - Patient > 75%     Toileting Toileting Toileting Activity did not occur Press photographer and hygiene only): N/A (no void or bm)  Toileting assist Assist for toileting: Minimal Assistance - Patient > 75%     Transfers Chair/bed transfer  Transfers assist     Chair/bed transfer assist level: Contact Guard/Touching assist     Locomotion Ambulation   Ambulation assist      Assist level: Contact Guard/Touching assist Assistive device: Walker-rolling Max distance: 150   Walk 10 feet activity   Assist     Assist level: Contact Guard/Touching assist Assistive device: Walker-rolling   Walk 50 feet activity   Assist    Assist level: Contact Guard/Touching assist Assistive device:  Walker-rolling    Walk 150 feet activity   Assist    Assist level: Contact Guard/Touching assist Assistive device: Walker-rolling    Walk 10 feet on uneven surface  activity   Assist     Assist level: Minimal Assistance - Patient > 75% Assistive device:  (no device)   Wheelchair     Assist Is the patient using a wheelchair?: Yes Type of Wheelchair: Manual    Wheelchair assist level: Total Assistance - Patient < 25%      Wheelchair 50 feet with 2 turns activity    Assist        Assist Level: Total Assistance - Patient < 25%   Wheelchair 150 feet activity     Assist      Assist Level: Total Assistance - Patient < 25%   Blood pressure 127/60, pulse 70, temperature 98 F (36.7 C), resp. rate 16, height 5\' 2"  (1.575 m), weight 61.5 kg, SpO2 100%.  Medical Problem List and Plan: 1. Functional deficits secondary to left frontal and parietal lobe infarcts small vessel disease vs cardio-embolic due to A-fib.             -neurology recommends 30-day event monitor---set up with [facility] cardiology as outpt             -patient may  shower             -ELOS/Goals: 7-10 days, min assist to supervision goals  -Resting hand splint ordered  -Estimated discharge 7/30   2.  Antithrombotics: -DVT/anticoagulation:  Pharmaceutical: Heparin             -antiplatelet therapy: Aspirin and Plavix for three months and then asa alone   3. Pain Management: Tylenol as needed, Voltaren gel to right knee as needed (she asks for these before therapy sessions). Also wants k-pad.   4. 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 Neuropsych/cognition: This patient is capable of making decisions on her own behalf.   6. Skin/Wound Care: Routine skin care checks   7.  Fluids/Electrolytes/Nutrition: Routine Is and Os and follow-up chemistries   8: Hypertension: monitor TID and prn             -no home meds/avoid hypotension  -7/24-5 resting blood pressure appears stable, continue current regimen     03/31/2023    4:38 AM 03/30/2023    8:28 PM 03/30/2023    4:57 AM  Vitals with BMI  Systolic 127 150 098  Diastolic 60 68 58  Pulse 70 65 73      9: Prediabetes:  A1c = 6.0%; blood glucose in range currently             -carb modified diet   10: Hyperlipidemia: continue statin    11.  Orthostatic hypotension  -Start TED hose  -7/24 abdominal binder ordered, encourage oral fluid intake  -7/25 continue to encourage fluid intake, continue abdominal binder and TED hose when up  12.  Right knee OA  -7/23 patient declines knee injection today, continue Voltaren.  She reports that she has a knee brace that helps  -7/25 she reports pain continues to be improved  13.  Azotemia  -Creatinine slightly above her usual at 1.01 today, encourage oral fluid intake  -7/25 BUNs/CR slightly increased to 20/1.06, continue to encourage fluid intake  14.  Constipation  -7/24 last BM recorded yesterday-volume not documented.  Scheduled MiraLAX, continue Senokot  -7/25 last BM today improved, she still feels a little constipated but does not want any scheduled medication at this time, she will try diet modification   LOS: 3 days A FACE TO FACE EVALUATION WAS PERFORMED  Fanny Dance 03/31/2023, 1:02 PM

## 2023-03-31 NOTE — Group Note (Signed)
Patient Details Name: AMRITA RADU MRN: [id number] DOB: [birth year] Today's Date: 03/31/2023  Time Calculation: OT Group Time Calculation OT Group Start Time: 1430 OT Group Stop Time: 1530 OT Group Time Calculation (min): 60 min      Group Description: Dance Group: Pt participated in dance group with an emphasis on social interaction, motor planning, increasing overall activity tolerance and bimanual tasks. All songs were selected by group members. Dance moves included AROM of BUE/BLE gross motor movements with an emphasis on building functional endurance.    Individual level documentation: Patient completed group from sitting level. Patientt needed supervision to complete various dance moves with cues for body alignment.  Patient created her own modifications during group.  Pain:  0/10  Precautions:  Falls, low BP, abdominal binder  Army Fossa 03/31/2023, 3:51 PM

## 2023-03-31 NOTE — Progress Notes (Signed)
Patient ID: [name], female   DOB: [birth year], 89 y.o.   MRN: [id number]  Have placed order for rollator via [employer] for discharge.

## 2023-03-31 NOTE — Progress Notes (Signed)
Physical Therapy Session Note  Patient Details  Name: [name] MRN: [id number] Date of Birth: [birth year]  Today's Date: 03/31/2023 PT Individual Time: 4098-1191 PT Individual Time Calculation (min): 48 min   Short Term Goals: Week 1:  PT Short Term Goal 1 (Week 1): STG=LTG 2/2 ELOS  Skilled Therapeutic Interventions/Progress Updates:      Pt seated in [facility] with all needs within reach with daughter in room. Pt agreeable to therapy. Pt denies any pain.   Trialed gait with rollator, pt ambulated 150+ feet with rollator and CGA with verbal cues provided for positioning of R UE. Education provided for positioning wheelchair against wall for rest breaks. Pt turned and performed stand<>sit, sit<>stand with CGA and verbal cues provided for technique. Verbal cues provided for heel strike as pt ambulating with R LE foot flat. Therapist assessed DF, and pt active DF slightly less than neutral. Therapist attempted to donn AFO into pt two pair of sketchers present in room, but unable. Discussed pt family bringing tennis shoes with tongue from home, pt plans to bring tomorrow. Scheduled AFO consult for 7/26 @1 :00.   Pt reports preference for rollator versus RW. Notified Child psychotherapist.   Pt seated in [facility] with all needs within reach and seatbelt alarm on.   Therapy Documentation Precautions:  Precautions Precautions: Fall Precaution Comments: R-hemipleiga (UE>LE) Restrictions Weight Bearing Restrictions: No  Therapy/Group: Individual Therapy  [facility] Ambrose Finland, Makena, DPT  03/31/2023, 3:56 PM

## 2023-04-01 NOTE — Progress Notes (Signed)
Physical Therapy Session Note  Patient Details  Name: [name] MRN: [id number] Date of Birth: 1931/02/20  Today's Date: 04/01/2023 PT Individual Time: 0852-0920 PT Individual Time Calculation (min): 28 min   Short Term Goals: Week 1:  PT Short Term Goal 1 (Week 1): STG=LTG 2/2 ELOS  Skilled Therapeutic Interventions/Progress Updates:    Chart reviewed and pt agreeable to therapy visit this date. Pt received seated EOB with daughter present. Patient requesting to change into fresh top and don bra, set up assist provided and pt taking extra time to sequence removing upper body clothing. Min assist required to correct bra alignment and find Rt UE sleeve in shirt. Pt and daughter noted injection sites for heparin continue to ooze blood and dressing are saturated. MD arrived to room to as he was planning to assess injection sites. No concerns from MD and fresh dressing applied to site. Cues for safe locking of rollator brake prior to stand and CGA for sit>stand with Rollator. Pt amb ~60' and reported some fatigue. Cues for sequencing to place rollator against wall and lock brakes for seated rest. Pt recovered after ~2 minutes and ambulated ~60' back to room.  EOS pt agreeable to sit up in [facility], Alarm on and call bell within reach.   Therapy Documentation Precautions:  Precautions Precautions: Fall Precaution Comments: R-hemipleiga (UE>LE) Restrictions Weight Bearing Restrictions: No  Pain:  No c/o pain   Therapy/Group: Individual Therapy  Wynn Maudlin, DPT Acute Rehabilitation Services Office [phone]  04/01/23 7:42 AM

## 2023-04-01 NOTE — Progress Notes (Signed)
Speech Language Pathology Daily Session Note  Patient Details  Name: [name] MRN: [id number] Date of Birth: 05-17-1931  Today's Date: 04/01/2023 SLP Individual Time: 1401-1501 SLP Individual Time Calculation (min): 60 min  Short Term Goals: Week 1: SLP Short Term Goal 1 (Week 1): STG = LTG due to ELOS  Skilled Therapeutic Interventions: Skilled therapy session focused on cognitive goals. SLP facilitated session by providing supervision A during functional mathematical calculations. Patient utilized L hand to compute items on paper. Patient able to independently compute addition tasks, however required increased cueing during subtraction/division tasks. Family requested recommendations regarding activities to complete to aid in cognition outside of therapy. SLP recommended puzzles, complex games such as chess/checkers, and word searches. Patient continues to demonstrate functional progress towards LTG's. Patient left in [facility] with alarm set and call bell in reach. Continue POC   Pain Denies Pain  Therapy/Group: Individual Therapy  Dirk Vanaman M.A., CF-SLP 04/01/2023, 3:16 PM

## 2023-04-01 NOTE — Progress Notes (Signed)
PROGRESS NOTE   Subjective/Complaints: No acute events overnight noted.  She has had some small amount of bleeding on the bandages from Lovenox shots from yesterday, injections from today not bleeding at this time.  Right wrist cock-up splint ordered.  Last BM 7/25  ROS: Patient denies fever, rash, sore throat, blurred vision, nausea, vomiting, diarrhea, cough, shortness of breath or chest pain, back/neck pain, headache, or mood change. + R knee pain-improved + Dizziness + constipation-improved  Objective:   No results found. No results for input(s): "WBC", "HGB", "HCT", "PLT" in the last 72 hours.  Recent Labs    03/31/23 0820  NA 136  K 4.4  CL 104  CO2 23  GLUCOSE 149*  BUN 20  CREATININE 1.06*  CALCIUM 9.6    Intake/Output Summary (Last 24 hours) at 04/01/2023 1610 Last data filed at 04/01/2023 0738 Gross per 24 hour  Intake 580 ml  Output --  Net 580 ml        Physical Exam: Vital Signs Blood pressure (!) 109/54, pulse 76, temperature 98.2 F (36.8 C), temperature source Oral, resp. rate 17, height 5\' 2"  (1.575 m), weight 61.5 kg, SpO2 100%.  Physical Exam   General: No apparent distress, working with therapy in her room HEENT: Head is normocephalic, atraumatic, mucous membranes moist Neck: Supple without JVD or lymphadenopathy Heart: Reg rate and rhythm. Chest: CTA bilaterally without wheezes, rales, or rhonchi; no distress Abdomen: Soft, non-tender, non-distended, bowel sounds positive. Extremities: No clubbing, cyanosis, or edema. Pulses are 2+ Psych: Pt's affect is appropriate. Pt is cooperative and very pleasant Skin: Small amount of blood on dressings from prior Lovenox injections on her right abdomen.  Dry dressings in place over Lovenox injections on left abdomen. Neuro:     Mental Status: She is alert and awake    Comments: Pt alert and oriented. Reasonable insight and awareness. Normal  language and speech. Functional memory. Mild right central 7. MMT: RUE 3- to 3/5 prox to 2+ to 3-/5 distally. RLE 4/5 prox to distal. LUE and LLE 4+ to 5/5. +PD RUE.  No abnormal resting tone noted Musculoskeletal:  Right shoulder tenderness with IR/ER. RA deformities of both hands/ulnar deviation  R Knee tenderness to palpation -improved    Assessment/Plan: 1. Functional deficits which require 3+ hours per day of interdisciplinary therapy in a comprehensive inpatient rehab setting. Physiatrist is providing close team supervision and 24 hour management of active medical problems listed below. Physiatrist and rehab team continue to assess barriers to discharge/monitor patient progress toward functional and medical goals  Care Tool:  Bathing    Body parts bathed by patient: Right arm, Left arm, Chest, Abdomen, Front perineal area, Buttocks, Right upper leg, Left upper leg, Right lower leg, Left lower leg, Face         Bathing assist Assist Level: Contact Guard/Touching assist     Upper Body Dressing/Undressing Upper body dressing   What is the patient wearing?: Pull over shirt    Upper body assist Assist Level: Minimal Assistance - Patient > 75%    Lower Body Dressing/Undressing Lower body dressing      What is the patient wearing?: Underwear/pull up, Pants  Lower body assist Assist for lower body dressing: Minimal Assistance - Patient > 75%     Toileting Toileting Toileting Activity did not occur Press photographer and hygiene only): N/A (no void or bm)  Toileting assist Assist for toileting: Minimal Assistance - Patient > 75%     Transfers Chair/bed transfer  Transfers assist     Chair/bed transfer assist level: Contact Guard/Touching assist     Locomotion Ambulation   Ambulation assist      Assist level: Contact Guard/Touching assist Assistive device: Walker-rolling Max distance: 150   Walk 10 feet activity   Assist     Assist level:  Contact Guard/Touching assist Assistive device: Walker-rolling   Walk 50 feet activity   Assist    Assist level: Contact Guard/Touching assist Assistive device: Walker-rolling    Walk 150 feet activity   Assist    Assist level: Contact Guard/Touching assist Assistive device: Walker-rolling    Walk 10 feet on uneven surface  activity   Assist     Assist level: Minimal Assistance - Patient > 75% Assistive device:  (no device)   Wheelchair     Assist Is the patient using a wheelchair?: Yes Type of Wheelchair: Manual    Wheelchair assist level: Total Assistance - Patient < 25%      Wheelchair 50 feet with 2 turns activity    Assist        Assist Level: Total Assistance - Patient < 25%   Wheelchair 150 feet activity     Assist      Assist Level: Total Assistance - Patient < 25%   Blood pressure (!) 109/54, pulse 76, temperature 98.2 F (36.8 C), temperature source Oral, resp. rate 17, height 5\' 2"  (1.575 m), weight 61.5 kg, SpO2 100%.  Medical Problem List and Plan: 1. Functional deficits secondary to left frontal and parietal lobe infarcts small vessel disease vs cardio-embolic due to A-fib.             -neurology recommends 30-day event monitor---set up with [facility] cardiology as outpt             -patient may  shower             -ELOS/Goals: 7-10 days, min assist to supervision goals  -Resting hand splint ordered  -Estimated discharge 7/30  -Continue CIR PT OT  -Wrist cock-up splint ordered   2.  Antithrombotics: -DVT/anticoagulation:  Pharmaceutical: Heparin             -antiplatelet therapy: Aspirin and Plavix for three months and then asa alone   3. Pain Management: Tylenol as needed, Voltaren gel to right knee as needed (she asks for these before therapy sessions). Also wants k-pad.   4. 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 Neuropsych/cognition: This patient is capable of making decisions on her own behalf.   6. Skin/Wound Care: Routine skin care checks   7. Fluids/Electrolytes/Nutrition: Routine Is and Os and follow-up chemistries   8: Hypertension: monitor TID and prn             -no home meds/avoid hypotension  -7/24-6 resting blood pressure appears stable, continue current regimen     04/01/2023    5:41 AM  03/31/2023    6:40 PM 03/31/2023    3:51 PM  Vitals with BMI  Systolic 109 108 409  Diastolic 54 56 65  Pulse 76 67 70      9: Prediabetes:  A1c = 6.0%; blood glucose in range currently             -carb modified diet   10: Hyperlipidemia: continue statin    11.  Orthostatic hypotension  -Start TED hose  -7/24 abdominal binder ordered, encourage oral fluid intake  -7/25 continue to encourage fluid intake, continue abdominal binder and TED hose when up  -7/25 continue to encourage fluid intake  12.  Right knee OA  -7/23 patient declines knee injection today, continue Voltaren.  She reports that she has a knee brace that helps  -7/25 she reports pain continues to be improved  13.  Azotemia  -Creatinine slightly above her usual at 1.01 today, encourage oral fluid intake  -7/25 BUNs/CR slightly increased to 20/1.06, continue to encourage fluid intake  Recheck Monday BMP  14.  Constipation  -7/24 last BM recorded yesterday-volume not documented.  Scheduled MiraLAX, continue Senokot  -7/25 last BM today improved, she still feels a little constipated but does not want any scheduled medication at this time, she will try diet modification  -7/26 improved with bowel movement yesterday   LOS: 4 days A FACE TO FACE EVALUATION WAS PERFORMED  Fanny Dance 04/01/2023, 8:22 AM

## 2023-04-01 NOTE — Progress Notes (Signed)
Orthopedic Tech Progress Note Patient Details:  [name] 26-Mar-1931 [id number]  Patient ID: [name], female   DOB: 03/03/1931, 89 y.o.   MRN: [id number] [facility] contacted for rehab combo. Darleen Crocker 04/01/2023, 5:06 PM

## 2023-04-01 NOTE — Group Note (Addendum)
Patient Details Name: [name] MRN: [id number] DOB: 12/22/1930 Today's Date: 04/01/2023  Time Calculation: OT Group Time Calculation OT Group Start Time: 1105 OT Group Stop Time: 1205 OT Group Time Calculation (min): 60 min      Group Description: Fine motor: Pt participated in group session with a focus on R FMC, functional reach with RUE, problem solving, activity tolerance, visual attention, RUE AROM, and social interaction. Pt actively participated in group game of Uno game  where pts were instructed on rules of game. Pt using RUE to engage in activity, MIN cues needed on technique for using RUE as gross assist/stabilizer. Pt opted to sit during tasks. Pt needed MIN cues for sequencing rules of game. Pt able to hold cards with RUE and actively reach for cards with RUE.    Pain:  No pain reported during session       Barron Schmid 04/01/2023, 12:37 PM

## 2023-04-01 NOTE — Progress Notes (Signed)
Occupational Therapy Session Note  Patient Details  Name: [name] MRN: [id number] Date of Birth: 09-Dec-1930  Today's Date: 04/01/2023 OT Individual Time: 1015-1105 OT Individual Time Calculation (min): 50 min    Short Term Goals: Week 1:  OT Short Term Goal 1 (Week 1): STGs=LTGs due to patient's length of stay.     Skilled Therapeutic Interventions/Progress Updates:    Pt received in w/c with daughter present.  Her daughter had mentioned that someone on therapy team had said pt was supposed to get a wrist support splint as pt has limited active wrist extension.  No order or note seen from ortho services. Contacted PA to order a short arm wrist cock up splint for R hand.    Pt agreed to try to toilet prior to going to therapy and then to group.  Cues to push to stand using B hands and  standing fully before reaching hands to rollater handles.  She had difficulty maintaining R grasp on handle, noted handles to be set high so elbows close to a 90 degree angle.  Adjusted handles lower so she could have her hand in more of a weight bearing position and this helped her to achieve a stronger grasp.  Pt ambulated to toilet, toileted, cleansed in standing all with supervision. Min A to adjust briefs and then pt was able to pull up her pants.  Supervision to ambulate to sink to wash hands and then to w/c.    Pt taken to gym to address R wrist and hand weakness. Forearm placed on yoga block with arm on table.  Asked pt to lift her wrist and she was unable to do so. Explained estim to pt and she was agreeable to trying it. RN arrived to remove her IV from forearm so estim pads could be applied.  Used Empi estim unit on small muscle atrophy setting for only 8 minutes.  Pt able to tolerate an intensity of 18.  Post estim, pt was able to activate wrist extensors to lift hand to close to neutral from a flexed position.  Showed pt how to do AROM of wrist with gravity eliminated with forearm on towel  and thumb pointing up.  Pt then taken to day room to participate in a group.     Therapy Documentation Precautions:  Precautions Precautions: Fall Precaution Comments: R-hemipleiga (UE>LE) Restrictions Weight Bearing Restrictions: No Pain:  No c/o pain    Therapy/Group: Individual Therapy  Mick Tanguma 04/01/2023, 10:01 AM

## 2023-04-02 DIAGNOSIS — G8929 Other chronic pain: Secondary | ICD-10-CM

## 2023-04-02 MED ORDER — CETIRIZINE HCL 10 MG PO TABS
10.0000 mg | ORAL_TABLET | Freq: Every evening | ORAL | Status: DC | PRN
  Administered 2023-04-03: 10 mg via ORAL
  Filled 2023-04-02 (×2): qty 1

## 2023-04-02 NOTE — Plan of Care (Signed)
  Problem: Consults Goal: RH STROKE PATIENT EDUCATION Description: See Patient Education module for education specifics  Outcome: Progressing   Problem: RH BOWEL ELIMINATION Goal: RH STG MANAGE BOWEL WITH ASSISTANCE Description: STG Manage Bowel with toileting Assistance. Outcome: Progressing Goal: RH STG MANAGE BOWEL W/MEDICATION W/ASSISTANCE Description: STG Manage Bowel with Medication with mod I Assistance. Outcome: Progressing   Problem: RH SAFETY Goal: RH STG ADHERE TO SAFETY PRECAUTIONS W/ASSISTANCE/DEVICE Description: STG Adhere to Safety Precautions With cues Assistance/Device. Outcome: Progressing   Problem: RH KNOWLEDGE DEFICIT Goal: RH STG INCREASE KNOWLEDGE OF HYPERTENSION Description: Patient and family will be able to manage HTN with medications and dietary modifications using educational resources independently Outcome: Progressing Goal: RH STG INCREASE KNOWLEDGE OF STROKE PROPHYLAXIS Description: Patient and family will be able to manage secondary risks with medications and dietary modifications using educational resources independently Outcome: Progressing

## 2023-04-02 NOTE — Progress Notes (Signed)
PROGRESS NOTE   Subjective/Complaints: Patient reports bleeding from Lovenox shots is improved today.  She reports she used B12 and vitamin C over-the-counter pills at home, she would like to hold off on restarting these until she is discharged.  She reports her pain is overall well-controlled with Voltaren gel.  Last BM 7/25  ROS: Patient denies fever, chills sore throat, blurred vision, nausea, vomiting, diarrhea, cough, shortness of breath or chest pain, back/neck pain, headache, or mood change.    + R knee pain-improved + Dizziness + constipation   Objective:   No results found. No results for input(s): "WBC", "HGB", "HCT", "PLT" in the last 72 hours.  Recent Labs    03/31/23 0820  NA 136  K 4.4  CL 104  CO2 23  GLUCOSE 149*  BUN 20  CREATININE 1.06*  CALCIUM 9.6   No intake or output data in the 24 hours ending 04/02/23 1053       Physical Exam: Vital Signs Blood pressure (!) 111/52, pulse 77, temperature 98.1 F (36.7 C), resp. rate 18, height 5\' 2"  (1.575 m), weight 61.5 kg, SpO2 100%.  Physical Exam   General: No apparent distress, lying in bed HEENT: Head is normocephalic, atraumatic, mucous membranes moist Neck: Supple without JVD or lymphadenopathy Heart: Reg rate and rhythm. Chest: CTA bilaterally without wheezes, rales, or rhonchi; no distress Abdomen: Soft, non-tender, non-distended, bowel sounds positive. Extremities: No clubbing, cyanosis, or edema. Pulses are 2+ Psych: Pt's affect is appropriate. Pt is cooperative and very pleasant Skin: Small amount of blood on dressings from prior Lovenox injections- improved from yesterday Neuro:     Mental Status: She is alert and awake    Comments: Pt alert and oriented. Reasonable insight and awareness. Normal language and speech. Functional memory. Mild right central 7. MMT: RUE 3- to 3/5 prox to 2+ to 3-/5 distally. RLE 4/5 prox to distal. LUE and  LLE 4+ to 5/5. +PD RUE.  No abnormal resting tone noted Musculoskeletal:  Right shoulder tenderness with IR/ER. RA deformities of both hands/ulnar deviation  R Knee tenderness to palpation -improved    Assessment/Plan: 1. Functional deficits which require 3+ hours per day of interdisciplinary therapy in a comprehensive inpatient rehab setting. Physiatrist is providing close team supervision and 24 hour management of active medical problems listed below. Physiatrist and rehab team continue to assess barriers to discharge/monitor patient progress toward functional and medical goals  Care Tool:  Bathing    Body parts bathed by patient: Right arm, Left arm, Chest, Abdomen, Front perineal area, Buttocks, Right upper leg, Left upper leg, Right lower leg, Left lower leg, Face         Bathing assist Assist Level: Contact Guard/Touching assist     Upper Body Dressing/Undressing Upper body dressing   What is the patient wearing?: Pull over shirt    Upper body assist Assist Level: Minimal Assistance - Patient > 75%    Lower Body Dressing/Undressing Lower body dressing      What is the patient wearing?: Underwear/pull up, Pants     Lower body assist Assist for lower body dressing: Minimal Assistance - Patient > 75%     Toileting  Toileting Toileting Activity did not occur Press photographer and hygiene only): N/A (no void or bm)  Toileting assist Assist for toileting: Minimal Assistance - Patient > 75%     Transfers Chair/bed transfer  Transfers assist     Chair/bed transfer assist level: Contact Guard/Touching assist     Locomotion Ambulation   Ambulation assist      Assist level: Contact Guard/Touching assist Assistive device: Walker-rolling Max distance: 150   Walk 10 feet activity   Assist     Assist level: Contact Guard/Touching assist Assistive device: Walker-rolling   Walk 50 feet activity   Assist    Assist level: Contact Guard/Touching  assist Assistive device: Walker-rolling    Walk 150 feet activity   Assist    Assist level: Contact Guard/Touching assist Assistive device: Walker-rolling    Walk 10 feet on uneven surface  activity   Assist     Assist level: Minimal Assistance - Patient > 75% Assistive device:  (no device)   Wheelchair     Assist Is the patient using a wheelchair?: Yes Type of Wheelchair: Manual    Wheelchair assist level: Total Assistance - Patient < 25%      Wheelchair 50 feet with 2 turns activity    Assist        Assist Level: Total Assistance - Patient < 25%   Wheelchair 150 feet activity     Assist      Assist Level: Total Assistance - Patient < 25%   Blood pressure (!) 111/52, pulse 77, temperature 98.1 F (36.7 C), resp. rate 18, height 5\' 2"  (1.575 m), weight 61.5 kg, SpO2 100%.  Medical Problem List and Plan: 1. Functional deficits secondary to left frontal and parietal lobe infarcts small vessel disease vs cardio-embolic due to A-fib.             -neurology recommends 30-day event monitor---set up with [facility] cardiology as outpt             -patient may  shower             -ELOS/Goals: 7-10 days, min assist to supervision goals  -Resting hand splint ordered  -Estimated discharge 7/30  -Continue CIR PT OT  -Wrist cock-up splint ordered- pending   2.  Antithrombotics: -DVT/anticoagulation:  Pharmaceutical: Heparin             -antiplatelet therapy: Aspirin and Plavix for three months and then asa alone   3. Pain Management: Tylenol as needed, Voltaren gel to right knee as needed (she asks for these before therapy sessions). Also wants k-pad.   4. 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 Neuropsych/cognition: This patient is capable of making  decisions on her own behalf.   6. Skin/Wound Care: Routine skin care checks   7. Fluids/Electrolytes/Nutrition: Routine Is and Os and follow-up chemistries   8: Hypertension: monitor TID and prn             -no home meds/avoid hypotension  -7/24-7 resting blood pressure appears stable, continue current regimen     04/02/2023    6:17 AM 04/01/2023    7:30 PM 04/01/2023    3:25 PM  Vitals with BMI  Systolic 111 116 161  Diastolic 52 65 62  Pulse 77 71 70      9: Prediabetes:  A1c = 6.0%; blood glucose in range currently             -carb modified diet   10: Hyperlipidemia: continue statin    11.  Orthostatic hypotension  -Start TED hose  -7/24 abdominal binder ordered, encourage oral fluid intake  -7/25 continue to encourage fluid intake, continue abdominal binder and TED hose when up  -7/26 continue to encourage fluid intake  12.  Right knee OA  -7/23 patient declines knee injection today, continue Voltaren.  She reports that she has a knee brace that helps  -7/27 pain improved voltaren gel, continue  13.  Azotemia  -Creatinine slightly above her usual at 1.01 today, encourage oral fluid intake  -7/25 BUNs/CR slightly increased to 20/1.06, continue to encourage fluid intake  Recheck Monday BMP  -7/27 continue to encourage fluid intake  14.  Constipation  -7/24 last BM recorded yesterday-volume not documented.  Scheduled MiraLAX, continue Senokot  -7/25 last BM today improved, she still feels a little constipated but does not want any scheduled medication at this time, she will try diet modification  -7/27 she declined MiraLAX yesterday but took it today, advised to try sorbitol PRN if no BM by tonight   LOS: 5 days A FACE TO FACE EVALUATION WAS PERFORMED  Fanny Dance 04/02/2023, 10:49 AM

## 2023-04-03 DIAGNOSIS — J309 Allergic rhinitis, unspecified: Secondary | ICD-10-CM

## 2023-04-03 NOTE — Progress Notes (Signed)
PROGRESS NOTE   Subjective/Complaints: Patient reports Zyrtec is helping with her seasonal allergy symptoms.  Patient reports she is having trouble eating the food due to restrictions, low-salt and asked for diet to be liberalized.  No BM in a couple days however she declines additional scheduled laxatives, she is trying to modify diet.  ROS: Patient denies fever, chills sore throat, blurred vision, nausea, vomiting, diarrhea, cough, shortness of breath or chest pain, back/neck pain, headache, or mood change.    + R knee pain-improved + Dizziness + constipation   Objective:   No results found. No results for input(s): "WBC", "HGB", "HCT", "PLT" in the last 72 hours.  No results for input(s): "NA", "K", "CL", "CO2", "GLUCOSE", "BUN", "CREATININE", "CALCIUM" in the last 72 hours.   Intake/Output Summary (Last 24 hours) at 04/03/2023 1146 Last data filed at 04/03/2023 0700 Gross per 24 hour  Intake 436 ml  Output --  Net 436 ml         Physical Exam: Vital Signs Blood pressure 113/62, pulse 69, temperature 98.6 F (37 C), temperature source Oral, resp. rate 18, height 5\' 2"  (1.575 m), weight 61.5 kg, SpO2 100%.  Physical Exam   General: No apparent distress, lying in bed HEENT: Head is normocephalic, atraumatic, mucous membranes moist Neck: Supple without JVD or lymphadenopathy Heart: Reg rate and rhythm. Chest: CTA bilaterally without wheezes, rales, or rhonchi; no distress Abdomen: Soft, non-tender, non-distended, bowel sounds positive. Extremities: No clubbing, cyanosis, or edema. Pulses are 2+ Psych: Pt's affect is appropriate. Pt is cooperative and very pleasant Skin: Warm and dry Neuro:     Mental Status: She is alert and awake    Comments: Pt alert and oriented. Reasonable insight and awareness. Normal language and speech. Functional memory. Mild right central 7. MMT: RUE 3- to 3/5 prox to 2+ to 3-/5  distally. RLE 4/5 prox to distal. LUE and LLE 4+ to 5/5. +PD RUE.  No abnormal resting tone noted Musculoskeletal:  Right shoulder tenderness with IR/ER. RA deformities of both hands/ulnar deviation  R Knee tenderness to palpation -improved  Wrist cock-up splint at bedside  Assessment/Plan: 1. Functional deficits which require 3+ hours per day of interdisciplinary therapy in a comprehensive inpatient rehab setting. Physiatrist is providing close team supervision and 24 hour management of active medical problems listed below. Physiatrist and rehab team continue to assess barriers to discharge/monitor patient progress toward functional and medical goals  Care Tool:  Bathing    Body parts bathed by patient: Right arm, Left arm, Chest, Abdomen, Front perineal area, Buttocks, Right upper leg, Left upper leg, Right lower leg, Left lower leg, Face         Bathing assist Assist Level: Contact Guard/Touching assist     Upper Body Dressing/Undressing Upper body dressing   What is the patient wearing?: Pull over shirt    Upper body assist Assist Level: Minimal Assistance - Patient > 75%    Lower Body Dressing/Undressing Lower body dressing      What is the patient wearing?: Underwear/pull up, Pants     Lower body assist Assist for lower body dressing: Minimal Assistance - Patient > 75%  Toileting Toileting Toileting Activity did not occur Press photographer and hygiene only): N/A (no void or bm)  Toileting assist Assist for toileting: Minimal Assistance - Patient > 75%     Transfers Chair/bed transfer  Transfers assist     Chair/bed transfer assist level: Contact Guard/Touching assist     Locomotion Ambulation   Ambulation assist      Assist level: Contact Guard/Touching assist Assistive device: Walker-rolling Max distance: 150   Walk 10 feet activity   Assist     Assist level: Contact Guard/Touching assist Assistive device: Walker-rolling   Walk  50 feet activity   Assist    Assist level: Contact Guard/Touching assist Assistive device: Walker-rolling    Walk 150 feet activity   Assist    Assist level: Contact Guard/Touching assist Assistive device: Walker-rolling    Walk 10 feet on uneven surface  activity   Assist     Assist level: Minimal Assistance - Patient > 75% Assistive device:  (no device)   Wheelchair     Assist Is the patient using a wheelchair?: Yes Type of Wheelchair: Manual    Wheelchair assist level: Total Assistance - Patient < 25%      Wheelchair 50 feet with 2 turns activity    Assist        Assist Level: Total Assistance - Patient < 25%   Wheelchair 150 feet activity     Assist      Assist Level: Total Assistance - Patient < 25%   Blood pressure 113/62, pulse 69, temperature 98.6 F (37 C), temperature source Oral, resp. rate 18, height 5\' 2"  (1.575 m), weight 61.5 kg, SpO2 100%.  Medical Problem List and Plan: 1. Functional deficits secondary to left frontal and parietal lobe infarcts small vessel disease vs cardio-embolic due to A-fib.             -neurology recommends 30-day event monitor---set up with [facility] cardiology as outpt             -patient may  shower             -ELOS/Goals: 7-10 days, min assist to supervision goals  -Resting hand splint ordered  -Estimated discharge 7/30  -Continue CIR PT OT  -Wrist cock-up splint ordered-this has arrived   2.  Antithrombotics: -DVT/anticoagulation:  Pharmaceutical: Heparin             -antiplatelet therapy: Aspirin and Plavix for three months and then asa alone   3. Pain Management: Tylenol as needed, Voltaren gel to right knee as needed (she asks for these before therapy sessions). Also wants k-pad.   4. 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 Neuropsych/cognition: This patient is capable of making decisions on her own behalf.   6. Skin/Wound Care: Routine skin care checks   7. Fluids/Electrolytes/Nutrition: Routine Is and Os and follow-up chemistries   -Diet liberalized per patient request  8: Hypertension: monitor TID and prn             -no home meds/avoid hypotension  -7/24-8 resting blood pressure appears stable, continue current regimen     04/03/2023    4:34 AM 04/02/2023    7:44 PM 04/02/2023    2:11  PM  Vitals with BMI  Systolic 113 121 409  Diastolic 62 70 54  Pulse 69 66 74      9: Prediabetes:  A1c = 6.0%; blood glucose in range currently             -carb modified diet   10: Hyperlipidemia: continue statin    11.  Orthostatic hypotension  -Start TED hose  -7/24 abdominal binder ordered, encourage oral fluid intake  -7/25 continue to encourage fluid intake, continue abdominal binder and TED hose when up  -7/26 continue to encourage fluid intake  12.  Right knee OA  -7/23 patient declines knee injection today, continue Voltaren.  She reports that she has a knee brace that helps  -7/27 pain improved voltaren gel, continue  13.  Azotemia  -Creatinine slightly above her usual at 1.01 today, encourage oral fluid intake  -7/25 BUNs/CR slightly increased to 20/1.06, continue to encourage fluid intake  Recheck Monday BMP  -7/27-8 continue to encourage fluid intake  14.  Constipation  -7/24 last BM recorded yesterday-volume not documented.  Scheduled MiraLAX, continue Senokot  -7/25 last BM today improved, she still feels a little constipated but does not want any scheduled medication at this time, she will try diet modification  -7/28 declines additional scheduled medicine, discussed risk of constipation.  Discussed trying sorbitol as needed if no bowel movement tonight.  Encourage fluid intake  15.  Allergic rhinitis  -Zyrtec has been started with  improvement   LOS: 6 days A FACE TO FACE EVALUATION WAS PERFORMED  Fanny Dance 04/03/2023, 11:46 AM

## 2023-04-03 NOTE — Progress Notes (Signed)
Occupational Therapy Session Note  Patient Details  Name: [name] MRN: [id number] Date of Birth: 1930-11-28  Today's Date: 04/03/2023 OT Individual Time: 4098-1191 OT Individual Time Calculation (min): 70 min   Today's Date: 04/03/2023 OT Individual Time: 4782-9562 OT Individual Time Calculation (min): 28 min   Short Term Goals: Week 1:  OT Short Term Goal 1 (Week 1): STGs=LTGs due to patient's length of stay.  Skilled Therapeutic Interventions/Progress Updates:   Session 1: Pt received resting in bed for skilled OT session with focus on BADL participation and unofficial caregiver education. Pt agreeable to interventions, demonstrating overall pleasant mood. Pt with no reports of pain. OT offering intermediate rest breaks and positioning suggestions throughout session to address potential pain/fatigue and maximize participation/safety in session.   Pt performs all functional transfers this session with CGA-SUP + rollator. Pt politely requesting to take a shower, bathing full-body with SUP. Pt manages LB garments with Min A for R-side of body, and setup/supervision for UB garments with increased time. Pt continent of bladder void, supervision for 3/3 toileting activities.   Time dedicated to caregiver education with daughter [name]), pt and daughter cleared for room-level transfers. Nursing staff and safety plan updated.   BP monitored throughout, noted below: Before shower: 129/62 After shower: 140/74 After dressing: 114/65  Pt remained sitting in recliner with all immediate needs met at end of session. Pt continues to be appropriate for skilled OT intervention to promote further functional independence.   Session 2: Pt received resting in bed for skilled OT session with focus on functional transfers. Pt agreeable to interventions, demonstrating overall pleasant mood. Pt with no reports of pain. OT offering intermediate rest breaks and positioning suggestions throughout  session to address potential pain/fatigue and maximize participation/safety in session.   Pt ambulates from room <> tub room with supervision + rollator, cuing required for placement of R-hand (w/cock up splint) on rollator handle. Plan to further problem solve. In tub room, pt performs TTB transfer with supervision + cuing. Pt's daughter requesting information on swivel TTB for ease of transfer with R-knee pain. Plan to provide information on this AD and suction-cup grab bars.   Extensive time dedicated to education patient and daughter on purpose/importance of using R-wrist cock-up splint. Both receptive to education.   Pt remained resting EOB with NT, all immediate needs met at end of session. Pt continues to be appropriate for skilled OT intervention to promote further functional independence.   Therapy Documentation Precautions:  Precautions Precautions: Fall Precaution Comments: R-hemipleiga (UE>LE) Restrictions Weight Bearing Restrictions: No   Therapy/Group: Individual Therapy  Lou Cal, OTR/L, MSOT  04/03/2023, 5:28 AM

## 2023-04-03 NOTE — Progress Notes (Addendum)
Physical Therapy Session Note  Patient Details  Name: [name] MRN: [id number] Date of Birth: 11/15/1930  Today's Date: 04/03/2023 PT Individual Time: 4098-1191, 4782-9562 PT Individual Time Calculation (min): 45 min, 39 min  Missed Time: 6 minutes   Short Term Goals: Week 1:  PT Short Term Goal 1 (Week 1): STG=LTG 2/2 ELOS  Skilled Therapeutic Interventions/Progress Updates:     Treatment Session 1  Pt supine in bed upon arrival. Pt agreeable to therapy. Pt denies any pain.   Pt performed supine to sit with mod I. Pt donned ted hose and abdominal binder with total A for time/energy conservation.   Donned UE day splint, pt reports improved grip with brace.   Therapist followed up with [employer], rescheduled AFO consult for 1:30 on 7/29. Followed up with pt regarding shoes, pt family did not bring tennis shoes with tongue, but plan to for tomorrow for consult.   Pt ambulated from room to main gym, main gym to day room, and day room to room, with rollator and supervision, and performed seated rest breaks on rollator with supervision,  verbal cues provided for UE positioning on rollator and gripping with R thumb, intermittent verbal cues provided for locking/unlocking brakes for seated brake.   Pt ascended/descended 8 steps with L UE support only and CGA, verbal cues provided for technique.   Pt supine in bed at end of session with all needs within reach and seatbelt alarm on.   Treatment Session 2   Pt supine in bed upon arrival. Pt agreeable to therapy. Pt denies any pain iniitally but reports mild knee pain at end of session, therpaist donned ice to R knee and notified nursing as pt requesting voltaren gel.    Vitals assessed: seated systolic 130 , standing systolic BP 109 , ted hose on from previous session, therapist donned abdominal binder. Therapist denies any whooziness/dizziness throughout session.   Treatment Session focused on community integration, and attention to R  UE with dual tasking.   Pt ambulated from room, over threshold, in and out of elevator and throughout gift shop, scanning for different items identified by therapist and navigating narrow spaces to reach identified spots, intermittent verbal cues provided for attention to R UE and positioning on Rollator. Pt did not require any rest breaks, pt denies feeling winded or whoozy. At start of session, therapist encouraged pt to initiate rest breaks with rollator as needed.   Pt ambulated over uneven mat with rollator to simulate grass for backdoor entry with no stairs, pt required min A to lift rollator onto mat but otherwise supervision. Plan to demonstrate stair navigation and gait on grass during family training to determine which entry way pt and family feel most comfortable.   Discussed AFO consult rescheduled to 1:30 on 7/29, asked family to bring tennis shoes with tongue. Pt and family verbalized understanding. Discussed pt daughter adrienne participating in family training.   Pt supine in bed at end of session with all needs within reach and bed alarm on.   Therapy Documentation Precautions:  Precautions Precautions: Fall Precaution Comments: R-hemipleiga (UE>LE) Restrictions Weight Bearing Restrictions: No  Therapy/Group: Individual Therapy  [facility] Ambrose Finland, [place], DPT  04/03/2023, 7:31 AM

## 2023-04-04 ENCOUNTER — Other Ambulatory Visit: Payer: Self-pay | Admitting: Cardiology

## 2023-04-04 DIAGNOSIS — R638 Other symptoms and signs concerning food and fluid intake: Secondary | ICD-10-CM

## 2023-04-04 DIAGNOSIS — I639 Cerebral infarction, unspecified: Secondary | ICD-10-CM

## 2023-04-04 NOTE — Progress Notes (Signed)
Physical Therapy Discharge Summary  Patient Details  Name: [name] MRN: [id number] Date of Birth: 09/18/1930  Date of Discharge from PT service:April 04, 2023   Patient has met 11 of 11 long term goals due to improved activity tolerance, improved balance, increased strength, ability to compensate for deficits, improved attention, improved awareness, and improved coordination.  Patient to discharge at an ambulatory level Supervision.   Patient's care partner is independent to provide the necessary physical assistance at discharge.  Recommendation:  Patient will benefit from ongoing skilled PT services in outpatient setting to continue to advance safe functional mobility, address ongoing impairments in strength, balance, endurance, gait, and minimize fall risk.  Equipment: Rollator, reacher  Reasons for discharge: treatment goals met and discharge from hospital  Patient/family agrees with progress made and goals achieved: Yes  PT Discharge Precautions/Restrictions Precautions Precautions: Fall Precaution Comments: R-hemipleiga (UE>LE) & R-inattention Required Braces or Orthoses: Other Brace Other Brace: R LE uplift brace, shoe with toe guard, R LE PRAFO at night Restrictions Weight Bearing Restrictions: No Pain Interference Pain Interference Pain Effect on Sleep: 1. Rarely or not at all Pain Interference with Therapy Activities: 1. Rarely or not at all Pain Interference with Day-to-Day Activities: 1. Rarely or not at all Vision/Perception  Vision - History Ability to See in Adequate Light: 0 Adequate Perception Perception: Impaired Inattention/Neglect: Does not attend to right side of body Praxis Praxis: Intact  Cognition Overall Cognitive Status: Impaired/Different from baseline Arousal/Alertness: Awake/alert Orientation Level: Oriented X4 Selective Attention: Appears intact Memory: Appears intact Awareness: Appears intact Problem Solving: Impaired Problem  Solving Impairment: Verbal complex;Functional complex Safety/Judgment: Appears intact Sensation Sensation Light Touch: Impaired by gross assessment Additional Comments: Pt able to detect light touch on all dermatomes in LE but reports diminished sensation on R LE compared to L Coordination Gross Motor Movements are Fluid and Coordinated: No Fine Motor Movements are Fluid and Coordinated: No Coordination and Movement Description: Decreased due to R-hemiplegia/hemiparesis. Motor  Motor Motor: Hemiplegia Motor - Skilled Clinical Observations: R-hemi (UE>LE)  Mobility Bed Mobility Bed Mobility: Rolling Right;Rolling Left;Supine to Sit;Sit to Supine Rolling Right: Independent with assistive device Rolling Left: Independent with assistive device Supine to Sit: Independent with assistive device Sit to Supine: Independent with assistive device Transfers Transfers: Sit to Stand;Stand to Sit Sit to Stand: Independent with assistive device Stand to Sit: Independent with assistive device Stand Pivot Transfers: Independent with assistive device Transfer (Assistive device): Rollator Locomotion  Gait Ambulation: Yes Gait Assistance: Supervision/Verbal cueing Gait Distance (Feet): 150 Feet Assistive device: Rollator Gait Assistance Details: Verbal cues for gait pattern;Tactile cues for weight shifting;Verbal cues for technique Gait Assistance Details: verbal cues provided for R UE inattention Gait Gait: Yes Gait Pattern: Impaired Gait Pattern: Decreased stance time - right;Decreased dorsiflexion - right;Decreased stride length;Decreased weight shift to right;Decreased step length - right;Decreased step length - left;Poor foot clearance - right Gait velocity: slow Stairs / Additional Locomotion Stairs: Yes Stairs Assistance: Contact Guard/Touching assist Stair Management Technique: One rail Left Number of Stairs: 12 Height of Stairs: 6 Ramp: Contact Guard/touching assist Pick up small  object from the floor assist level: Independent with assistive device Pick up small object from the floor assistive device: rollator and Engineer, manufacturing Wheelchair Mobility: No  Trunk/Postural Assessment  Cervical Assessment Cervical Assessment: Within Functional Limits Thoracic Assessment Thoracic Assessment: Within Functional Limits Lumbar Assessment Lumbar Assessment: Within Functional Limits Postural Control Postural Control: Within Functional Limits  Balance Balance Balance Assessed: Yes Static Sitting Balance  Static Sitting - Balance Support: Feet supported Static Sitting - Level of Assistance: 6: Modified independent (Device/Increase time) Dynamic Sitting Balance Dynamic Sitting - Balance Support: During functional activity Dynamic Sitting - Level of Assistance: 6: Modified independent (Device/Increase time) Static Standing Balance Static Standing - Balance Support: During functional activity;No upper extremity supported Static Standing - Level of Assistance: 6: Modified independent (Device/Increase time) (supervision) Dynamic Standing Balance Dynamic Standing - Balance Support: During functional activity;Left upper extremity supported;Bilateral upper extremity supported Dynamic Standing - Level of Assistance: 5: Stand by assistance (supervision) Dynamic Standing - Balance Activities: Lateral lean/weight shifting;Forward lean/weight shifting Dynamic Standing - Comments: stand pivot transfer and gait Extremity Assessment  RLE Assessment RLE Assessment: Exceptions to [facility] Passive Range of Motion (PROM) Comments: Pt R knee flexion limited 2/2 arthristis Active Range of Motion (AROM) Comments: Pt R knee flexion limited 2/2 arthritis, dorsiflexion limited to slightly less than neutral General Strength Comments: grossly 3+/5 LLE Assessment LLE Assessment: Within Functional Limits   [facility] Ambrose Finland, PT, DPT  04/04/2023, 4:47 PM

## 2023-04-04 NOTE — Progress Notes (Signed)
Speech Language Pathology Discharge Summary  Patient Details  Name: [name] MRN: [id number] Date of Birth: [birth year]  Date of Discharge from SLP service:April 04, 2023  Today's Date: 04/04/2023 SLP Individual Time: 1101-1201 SLP Individual Time Calculation (min): 60 min   Skilled Therapeutic Interventions:   Skilled therapy session focused on cognitive goals. SLP facilitated session by providing mod I assist to answer functional problem solving questions utilizing a TV guide. Patient able to answer functional and mathematical questions with 80% accuracy and mod I verbal assist.  SLP reviewed patients current level of cognitive function and provided family with activities to complete at home to aid facilitate cognition. Patient left in chair with call bell and family present. Continue POC.  Patient has met 1 of 1 long term goals.  Patient to discharge at overall Modified Independent level.   Clinical Impression/Discharge Summary:  Patient has made excellent gains and has met 1 of 1 LTG's this admission due to improved cognitive skills. Patient is an overall  mod I assist for complex problem solving skills as she continues to benefit from external aids such as calendars, BID pill box and utilization of iPhone for financial management tasks. Pt/family education complete and patient will discharge home with supervision from family. No f/u services recommended at this time.   Care Partner:  Caregiver Able to Provide Assistance: Yes  Type of Caregiver Assistance: Cognitive;Physical  Recommendation:  None     Equipment: n/a   Reasons for discharge: Discharged from hospital;Treatment goals met   Patient/Family Agrees with Progress Made and Goals Achieved: Yes    Karla Pavone M.A., CF-SLP 04/04/2023, 12:39 PM

## 2023-04-04 NOTE — Plan of Care (Signed)
  Problem: RH Balance Goal: LTG Patient will maintain dynamic sitting balance (PT) Description: LTG:  Patient will maintain dynamic sitting balance with assistance during mobility activities (PT) Outcome: Completed/Met Goal: LTG Patient will maintain dynamic standing balance (PT) Description: LTG:  Patient will maintain dynamic standing balance with assistance during mobility activities (PT) Outcome: Completed/Met   Problem: Sit to Stand Goal: LTG:  Patient will perform sit to stand with assistance level (PT) Description: LTG:  Patient will perform sit to stand with assistance level (PT) Outcome: Completed/Met   Problem: RH Bed Mobility Goal: LTG Patient will perform bed mobility with assist (PT) Description: LTG: Patient will perform bed mobility with assistance, with/without cues (PT). Outcome: Completed/Met   Problem: RH Bed to Chair Transfers Goal: LTG Patient will perform bed/chair transfers w/assist (PT) Description: LTG: Patient will perform bed to chair transfers with assistance (PT). Outcome: Completed/Met   Problem: RH Car Transfers Goal: LTG Patient will perform car transfers with assist (PT) Description: LTG: Patient will perform car transfers with assistance (PT). Outcome: Completed/Met   Problem: RH Furniture Transfers Goal: LTG Patient will perform furniture transfers w/assist (OT/PT) Description: LTG: Patient will perform furniture transfers  with assistance (OT/PT). Outcome: Completed/Met   Problem: RH Ambulation Goal: LTG Patient will ambulate in controlled environment (PT) Description: LTG: Patient will ambulate in a controlled environment, # of feet with assistance (PT). Outcome: Completed/Met Goal: LTG Patient will ambulate in home environment (PT) Description: LTG: Patient will ambulate in home environment, # of feet with assistance (PT). Outcome: Completed/Met   Problem: RH Stairs Goal: LTG Patient will ambulate up and down stairs w/assist  (PT) Description: LTG: Patient will ambulate up and down # of stairs with assistance (PT) Outcome: Completed/Met   Problem: RH Ambulation Goal: LTG Patient will ambulate in community environment (PT) Description: LTG: Patient will ambulate in community environment, # of feet with assistance (PT). Outcome: Completed/Met

## 2023-04-04 NOTE — Progress Notes (Unsigned)
   Event monitor s/p CVA. Results to Dr. Jacques Navy with follow up appt arranged.  Perlie Gold, PA-C

## 2023-04-04 NOTE — Progress Notes (Signed)
PROGRESS NOTE   Subjective/Complaints: Pt in good spirits. Happy that she's going home tomorrow!  Doesn't like heart healthy diet and hasn't been eating much as a result.   ROS: Patient denies fever, rash, sore throat, blurred vision, dizziness, nausea, vomiting, diarrhea, cough, shortness of breath or chest pain, joint or back/neck pain, headache, or mood change.    Objective:   No results found. Recent Labs    04/04/23 0704  WBC 5.5  HGB 11.7*  HCT 35.1*  PLT 212    Recent Labs    04/04/23 0704  NA 134*  K 4.1  CL 105  CO2 21*  GLUCOSE 96  BUN 20  CREATININE 1.03*  CALCIUM 9.4    No intake or output data in the 24 hours ending 04/04/23 1043        Physical Exam: Vital Signs Blood pressure (!) 127/59, pulse 65, temperature 98 F (36.7 C), resp. rate 18, height 5\' 2"  (1.575 m), weight 61.5 kg, SpO2 99%.  Physical Exam   Constitutional: No distress . Vital signs reviewed. HEENT: NCAT, EOMI, oral membranes moist Neck: supple Cardiovascular: RRR without murmur. No JVD    Respiratory/Chest: CTA Bilaterally without wheezes or rales. Normal effort    GI/Abdomen: BS +, non-tender, non-distended Ext: no clubbing, cyanosis, or edema Psych: pleasant and cooperative  Skin: Warm and dry Neuro:     Mental Status: She is alert and awake    Comments: Pt alert and oriented. Reasonable insight and awareness. Normal language and speech. Functional memory. Mild right central 7. MMT: RUE 3- to 3/5 prox to 2+ to 3-/5 distally. RLE 4/5 prox to distal. LUE and LLE 4+ to 5/5. +PD RUE.  No abnormal resting tone noted. Good sitting balance EOB. Musculoskeletal:  Right shoulder tenderness with IR/ER. RA deformities of both hands/ulnar deviation  R Knee tenderness to palpation -improved     Assessment/Plan: 1. Functional deficits which require 3+ hours per day of interdisciplinary therapy in a comprehensive inpatient  rehab setting. Physiatrist is providing close team supervision and 24 hour management of active medical problems listed below. Physiatrist and rehab team continue to assess barriers to discharge/monitor patient progress toward functional and medical goals  Care Tool:  Bathing    Body parts bathed by patient: Right arm, Left arm, Chest, Abdomen, Front perineal area, Buttocks, Right upper leg, Left upper leg, Right lower leg, Left lower leg, Face         Bathing assist Assist Level: Contact Guard/Touching assist     Upper Body Dressing/Undressing Upper body dressing   What is the patient wearing?: Pull over shirt    Upper body assist Assist Level: Minimal Assistance - Patient > 75%    Lower Body Dressing/Undressing Lower body dressing      What is the patient wearing?: Underwear/pull up, Pants     Lower body assist Assist for lower body dressing: Minimal Assistance - Patient > 75%     Toileting Toileting Toileting Activity did not occur (Clothing management and hygiene only): N/A (no void or bm)  Toileting assist Assist for toileting: Minimal Assistance - Patient > 75%     Transfers Chair/bed transfer  Transfers assist  Chair/bed transfer assist level: Supervision/Verbal cueing     Locomotion Ambulation   Ambulation assist      Assist level: Supervision/Verbal cueing Assistive device: Rollator Max distance: 150   Walk 10 feet activity   Assist     Assist level: Supervision/Verbal cueing Assistive device: Rollator   Walk 50 feet activity   Assist    Assist level: Supervision/Verbal cueing Assistive device: Rollator    Walk 150 feet activity   Assist    Assist level: Supervision/Verbal cueing Assistive device: Rollator    Walk 10 feet on uneven surface  activity   Assist     Assist level: Minimal Assistance - Patient > 75% Assistive device:  (no device)   Wheelchair     Assist Is the patient using a wheelchair?:  No Type of Wheelchair: Manual    Wheelchair assist level: Total Assistance - Patient < 25%      Wheelchair 50 feet with 2 turns activity    Assist        Assist Level: Total Assistance - Patient < 25%   Wheelchair 150 feet activity     Assist      Assist Level: Total Assistance - Patient < 25%   Blood pressure (!) 127/59, pulse 65, temperature 98 F (36.7 C), resp. rate 18, height 5\' 2"  (1.575 m), weight 61.5 kg, SpO2 99%.  Medical Problem List and Plan: 1. Functional deficits secondary to left frontal and parietal lobe infarcts small vessel disease vs cardio-embolic due to A-fib.             -neurology recommends 30-day event monitor---set up with [facility] cardiology as outpt             -patient may  shower              -Resting hand splint ordered  -Estimated discharge 7/30  -finalize dc planning   2.  Antithrombotics: -DVT/anticoagulation:  Pharmaceutical: Heparin             -antiplatelet therapy: Aspirin and Plavix for three months and then asa alone   3. Pain Management: Tylenol as needed, Voltaren gel to right knee as needed (she asks for these before therapy sessions). Also wants k-pad.   4. 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 Neuropsych/cognition: This patient is capable of making decisions on her own behalf.   6. Skin/Wound Care: Routine skin care checks   7. Fluids/Electrolytes/Nutrition: Routine Is and Os and follow-up chemistries   -Diet liberalized per patient request  7/29 diet already modified yesterday as noted above --consulted RD to provide some information on reduced sodium diet. We talked about some options in room today.  8: Hypertension: monitor TID and prn             -no home meds/avoid hypotension  -7/24-9 resting blood  pressure appears stable, continue current regimen     04/04/2023    4:16 AM 04/03/2023    7:05 PM 04/03/2023    1:37 PM  Vitals with BMI  Systolic 127 115 97  Diastolic 59 56 67  Pulse 65 65 82      9: Prediabetes:  A1c = 6.0%; blood glucose in range currently             -  carb modified diet   10: Hyperlipidemia: continue statin    11.  Orthostatic hypotension  -Start TED hose  -7/24 abdominal binder ordered, encourage oral fluid intake  -7/25 continue to encourage fluid intake, continue abdominal binder and TED hose when up  -7/26 continue to encourage fluid intake  12.  Right knee OA  -7/23 patient declines knee injection today, continue Voltaren.  She reports that she has a knee brace that helps  -7/27 pain improved voltaren gel, continue  13.  Azotemia  -Creatinine slightly above her usual at 1.01 today, encourage oral fluid intake  -7/25 BUNs/CR slightly increased to 20/1.06, continue to encourage fluid intake  Recheck Monday BMP  -7/29 labs essentially within normal limits today  14.  Constipation  -7/24 last BM recorded yesterday-volume not documented.  Scheduled MiraLAX, continue Senokot  -7/25 last BM today improved, she still feels a little constipated but does not want any scheduled medication at this time, she will try diet modification  -7/28 declines additional scheduled medicine, discussed risk of constipation.  Discussed trying sorbitol as needed if no bowel movement tonight.   -Encourage fluid intake  15.  Allergic rhinitis  -Zyrtec has been started with improvement   LOS: 7 days A FACE TO FACE EVALUATION WAS PERFORMED  Ranelle Oyster 04/04/2023, 10:43 AM

## 2023-04-04 NOTE — Plan of Care (Signed)
  Problem: RH Problem Solving Goal: LTG Patient will demonstrate problem solving for (SLP) Description: LTG:  Patient will demonstrate problem solving for basic/complex daily situations with cues  (SLP) Outcome: Completed/Met

## 2023-04-04 NOTE — Progress Notes (Addendum)
Physical Therapy Session Note  Patient Details  Name: [name] MRN: [id number] Date of Birth: 09/14/1930  Today's Date: 04/04/2023 PT Individual Time: 1016-1100, 1330-1419 PT Individual Time Calculation (min): 44 min, 49 min   Short Term Goals: Week 1:  PT Short Term Goal 1 (Week 1): STG=LTG 2/2 ELOS  Skilled Therapeutic Interventions/Progress Updates:     Treatment Session 1  Pt seated EOB upon arrival. Pt agreeable to therapy. Pt denies any pain.   Pt daughter present for family training. Education and demonstration provided for stair navigation with L UE support on handrail and CGA and step to gait , pt daughter returned demonstration, with verbal cues from therapist for technique.   Pt performed stand pivot transfer to car simulator, with Rollator and supervision. Verbal cues provided for technique. Pt daughter returned demonstration.  Pt ambulated with rollator  supervision, and took seated rest breaks on rollator with supervision, intermittent verbal cues provided for locking brakes and attention to R UE. Pt daughter returned demonstration.   Pt picked up object from floor with rollator and reacher with mod I with L UE.   Pt ambulated on uneven surface with rollator with min A to lift rollator onto mat, but CGA to walk across mat. Pt daughter returned demonstration.    Pt seated in WC at end of session with all needs within reach with family in room cleared to provide assistance as needed.   Treatment Session 2   Pt seated in WC at start of session, pt has shoes with laces. Pt met with orthotist from [facility] for AFO consult. Trialed toe up brace, and pt demos improved heel contact on heel strike but continues to demo toe drag with initial swing, discussed getting toe guard on R shoe. Pt and family agreeable. Verbal cues provided for hip flexion with R LE advancement.   Reviewed and performed the following HEP with B UE support on counter:  1x10 standing marching  1x10  standing hip abduction  1x10 standing hip extension Sit to stand with no UE support with R knee bend as much as possible to pt tolerance.   Handout provided for HEP. Therapist reached out to [facility] for [facility] for night to reduce risk of R LE plantar flexion contracture.   Pt and family deny any questions/concerns.   Pt seated in bed at end of session with family present cleared to provide assistance.    Therapy Documentation Precautions:  Precautions Precautions: Fall Precaution Comments: R-hemipleiga (UE>LE) & R-inattention Required Braces or Orthoses: Other Brace Other Brace: R LE uplift brace, shoe with toe guard, R LE PRAFO at night Restrictions Weight Bearing Restrictions: No Therapy/Group: Individual Therapy  [facility], [place], DPT  04/04/2023, 4:24 PM

## 2023-04-04 NOTE — Plan of Care (Signed)
  Problem: RH Balance Goal: LTG Patient will maintain dynamic standing with ADLs (OT) Description: LTG:  Patient will maintain dynamic standing balance with assist during activities of daily living (OT)  Outcome: Completed/Met   Problem: RH Eating Goal: LTG Patient will perform eating w/assist, cues/equip (OT) Description: LTG: Patient will perform eating with assist, with/without cues using equipment (OT) Outcome: Not Met (add Reason) Flowsheets (Taken 04/04/2023 1235) LTG: Pt will perform eating with assistance level of: Set up assist  Note: Pt continues to require intermediate A for setup of meals, including container management.    Problem: RH Bathing Goal: LTG Patient will bathe all body parts with assist levels (OT) Description: LTG: Patient will bathe all body parts with assist levels (OT) Outcome: Completed/Met   Problem: RH Dressing Goal: LTG Patient will perform upper body dressing (OT) Description: LTG Patient will perform upper body dressing with assist, with/without cues (OT). Outcome: Completed/Met Goal: LTG Patient will perform lower body dressing w/assist (OT) Description: LTG: Patient will perform lower body dressing with assist, with/without cues in positioning using equipment (OT) Outcome: Completed/Met   Problem: RH Toileting Goal: LTG Patient will perform toileting task (3/3 steps) with assistance level (OT) Description: LTG: Patient will perform toileting task (3/3 steps) with assistance level (OT)  Outcome: Completed/Met   Problem: RH Functional Use of Upper Extremity Goal: LTG Patient will use RT/LT upper extremity as a (OT) Description: LTG: Patient will use right/left upper extremity as a stabilizer/gross assist/diminished/nondominant/dominant level with assist, with/without cues during functional activity (OT) Outcome: Completed/Met   Problem: RH Simple Meal Prep Goal: LTG Patient will perform simple meal prep w/assist (OT) Description: LTG: Patient  will perform simple meal prep with assistance, with/without cues (OT). Outcome: Adequate for Discharge   Problem: RH Light Housekeeping Goal: LTG Patient will perform light housekeeping w/assist (OT) Description: LTG: Patient will perform light housekeeping with assistance, with/without cues (OT). Outcome: Adequate for Discharge   Problem: RH Toilet Transfers Goal: LTG Patient will perform toilet transfers w/assist (OT) Description: LTG: Patient will perform toilet transfers with assist, with/without cues using equipment (OT) Outcome: Completed/Met   Problem: RH Tub/Shower Transfers Goal: LTG Patient will perform tub/shower transfers w/assist (OT) Description: LTG: Patient will perform tub/shower transfers with assist, with/without cues using equipment (OT) Outcome: Completed/Met

## 2023-04-04 NOTE — Progress Notes (Signed)
Inpatient Rehabilitation Care Coordinator Discharge Note   Patient Details  Name: [name] MRN: [id number] Date of Birth: Jul 04, 1931   Discharge location: HOME WITH FAMILY MEMBERS PROVIDING 24/7 SUPERVISION  Length of Stay: 8 DAYS  Discharge activity level: SUPERVISION LEVEL  Home/community participation: ACTIVE  Patient response BJ:YNWGNF Literacy - How often do you need to have someone help you when you read instructions, pamphlets, or other written material from your doctor or pharmacy?: Never  Patient response AO:ZHYQMV Isolation - How often do you feel lonely or isolated from those around you?: Never  Services provided included: MD, RD, PT, OT, SLP, RN, CM, Pharmacy, SW  Financial Services:  Financial Services Utilized: Private Insurance [employer]  Choices offered to/list presented to: PT  Follow-up services arranged:  Outpatient, DME, Patient/Family has no preference for HH/DME agencies    Outpatient Servicies: [facility]  & OT WILL CALL TO SET UP FOLLOW UP APPOINTMENTS HAS BEEN THERE BEFORE AND PREFERS THEM DME : [facility] AND 3 IN 1    Patient response to transportation need: Is the patient able to respond to transportation needs?: Yes In the past 12 months, has lack of transportation kept you from medical appointments or from getting medications?: No In the past 12 months, has lack of transportation kept you from meetings, work, or from getting things needed for daily living?: No   Patient/Family verbalized understanding of follow-up arrangements:  Yes  Individual responsible for coordination of the follow-up plan: ANGELA-DAUGHTER [phone]  Confirmed correct DME delivered: Lucy Chris 04/04/2023    Comments (or additional information):FAMILY MEMBERS STAYED HERE WITH PT AND PROVIDED 24/7 CARE. AWARE OF MOM'S CARE NEEDS.     Lucy Chris

## 2023-04-04 NOTE — Progress Notes (Signed)
Occupational Therapy Discharge Summary  Patient Details  Name: [name] MRN: [id number] Date of Birth: December 21, 1930  Date of Discharge from OT service:April 04, 2023  Today's Date: 04/04/2023 OT Individual Time: 4098-1191 OT Individual Time Calculation (min): 60 min    Patient has met 10 of 11 long term goals due to improved activity tolerance, improved balance, postural control, ability to compensate for deficits, functional use of  RIGHT upper and RIGHT lower extremity, and improved attention.  Patient to discharge at overall Supervision level.  Patient's care partner is independent to provide the necessary physical and cognitive assistance at discharge.    Reasons goals not met: Pt did not meet self feeding goal due to continuing to require intermediate setup A for container management.  Recommendation:  Patient will benefit from ongoing skilled OT services in outpatient setting to continue to advance functional skills in the area of BADL, iADL, and Reduce care partner burden.  Equipment: TTB  Reasons for discharge: treatment goals met and discharge from hospital  Patient/family agrees with progress made and goals achieved: Yes  OT Discharge Precautions/Restrictions  Precautions Precautions: Fall Precaution Comments: R-hemipleiga (UE>LE) & R-inattention Restrictions Weight Bearing Restrictions: No Pain Pain Assessment Pain Scale: 0-10 Pain Score: 0-No pain ADL ADL Eating: Set up Where Assessed-Eating: Edge of bed Grooming: Supervision/safety Where Assessed-Grooming: Edge of bed Upper Body Bathing: Supervision/safety Where Assessed-Upper Body Bathing: Shower Lower Body Bathing: Supervision/safety Where Assessed-Lower Body Bathing: Shower Upper Body Dressing: Supervision/safety Where Assessed-Upper Body Dressing: Edge of bed Lower Body Dressing: Supervision/safety Where Assessed-Lower Body Dressing: Edge of bed Toileting: Supervision/safety Where  Assessed-Toileting: Toilet, Psychiatrist Transfer: Close supervision Toilet Transfer Method: Proofreader: Gaffer: Close supervison Web designer Method: Ship broker: Insurance underwriter: Close supervision Film/video editor Method: Designer, industrial/product: Sales promotion account executive Baseline Vision/History: 1 Wears glasses Patient Visual Report: No change from baseline Vision Assessment?: No apparent visual deficits Perception  Perception: Impaired Inattention/Neglect: Does not attend to right side of body Praxis Praxis: Intact Cognition Cognition Overall Cognitive Status: Impaired/Different from baseline Arousal/Alertness: Awake/alert Memory: Impaired Memory Impairment: Decreased short term memory Selective Attention: Appears intact Awareness: Appears intact Problem Solving: Impaired Safety/Judgment: Appears intact Brief Interview for Mental Status (BIMS) Repetition of Three Words (First Attempt): 3 Temporal Orientation: Year: Correct Temporal Orientation: Month: Accurate within 5 days Temporal Orientation: Day: Correct Recall: "Sock": No, could not recall Recall: "Blue": Yes, no cue required Recall: "Bed": No, could not recall BIMS Summary Score: 11 Sensation Sensation Light Touch: Impaired by gross assessment Hot/Cold: Appears Intact Proprioception: Impaired by gross assessment (RUE inattention.) Stereognosis: Not tested Additional Comments: Pt able to detect light touch on all dermatomes in LE but reports diminished sensation on R LE compared to L Coordination Gross Motor Movements are Fluid and Coordinated: No Fine Motor Movements are Fluid and Coordinated: No Coordination and Movement Description: Decreased due to R-hemiplegia/hemiparesis. Motor  Motor Motor: Hemiplegia Motor - Skilled Clinical Observations: R-hemi (UE>LE) Mobility   Transfers Sit to Stand: Independent with assistive device Stand to Sit: Independent with assistive device  Trunk/Postural Assessment  Cervical Assessment Cervical Assessment: Within Functional Limits Thoracic Assessment Thoracic Assessment: Within Functional Limits Lumbar Assessment Lumbar Assessment: Within Functional Limits Postural Control Postural Control: Deficits on evaluation Righting Reactions: Delayed Protective Responses: Delayed  Balance Balance Balance Assessed: Yes Static Sitting Balance Static Sitting - Balance Support: Feet supported Static Sitting - Level of Assistance: 6: Modified independent (Device/Increase  time) Dynamic Sitting Balance Dynamic Sitting - Balance Support: During functional activity Dynamic Sitting - Level of Assistance: 5: Stand by assistance (Supervision) Extremity/Trunk Assessment RUE Assessment RUE Assessment: Exceptions to [facility] RUE Body System: Neuro Brunstrum levels for arm and hand: Arm;Hand Brunstrum level for arm: Stage III Synergy is performed voluntarily Brunstrum level for hand: Stage V Independence from basic synergies RUE AROM (degrees) Overall AROM Right Upper Extremity: Deficits LUE Assessment LUE Assessment: Within Functional Limits  Skilled OT Intervention: Pt received in bathroom with daughter providing supervision. Skilled OT session with focus on BADL participation and discharge planning. Pt agreeable to interventions, demonstrating overall pleasant mood. Pt with no reports of pain. OT offering intermediate rest breaks and positioning suggestions throughout session to address potential pain/fatigue and maximize participation/safety in session.   Pt completes full shower and dressing with levels of assistance provided noted above. Pt and daughter provided with handout for TTB, further education on wrist cock up splint and transition of care to OP setting completed. Rollator adapted with Coban to increase functional grasp  with R-hand. All immediate questions/concerns answered.  Pt remained sitting in recliner with all immediate needs met at end of session. Pt continues to be appropriate for skilled OT intervention to promote further functional independence.    Lou Cal, OTR/L, MSOT  04/04/2023, 12:06 PM

## 2023-04-05 ENCOUNTER — Other Ambulatory Visit (HOSPITAL_COMMUNITY): Payer: Self-pay

## 2023-04-05 MED ORDER — ASPIRIN 81 MG PO CHEW
81.0000 mg | CHEWABLE_TABLET | Freq: Every day | ORAL | 0 refills | Status: AC
  Filled 2023-04-05: qty 90, 90d supply, fill #0

## 2023-04-05 MED ORDER — SERTRALINE HCL 25 MG PO TABS
12.5000 mg | ORAL_TABLET | Freq: Every day | ORAL | 0 refills | Status: DC
  Filled 2023-04-05: qty 15, 30d supply, fill #0

## 2023-04-05 MED ORDER — CLOPIDOGREL BISULFATE 75 MG PO TABS
75.0000 mg | ORAL_TABLET | Freq: Every day | ORAL | 0 refills | Status: DC
  Filled 2023-04-05: qty 30, 30d supply, fill #0

## 2023-04-05 MED ORDER — POLYETHYLENE GLYCOL 3350 17 G PO PACK: 17.0000 g | PACK | Freq: Every day | ORAL | Status: DC

## 2023-04-05 MED ORDER — CLOPIDOGREL BISULFATE 75 MG PO TABS
75.0000 mg | ORAL_TABLET | Freq: Every day | ORAL | 0 refills | Status: DC
Start: 2023-04-05 — End: 2023-04-28
  Filled 2023-04-05: qty 30, 30d supply, fill #0

## 2023-04-05 MED ORDER — DICLOFENAC SODIUM 1 % EX GEL: 2.0000 g | Freq: Four times a day (QID) | CUTANEOUS | Status: AC | PRN

## 2023-04-05 MED ORDER — ROSUVASTATIN CALCIUM 20 MG PO TABS
20.0000 mg | ORAL_TABLET | Freq: Every day | ORAL | 0 refills | Status: DC
  Filled 2023-04-05: qty 30, 30d supply, fill #0

## 2023-04-05 MED ORDER — ACETAMINOPHEN 325 MG PO TABS: 325.0000 mg | ORAL_TABLET | ORAL | Status: AC | PRN

## 2023-04-05 NOTE — Progress Notes (Signed)
Inpatient Rehabilitation Discharge Medication Review by a Pharmacist  A complete drug regimen review was completed for this patient to identify any potential clinically significant medication issues.  High Risk Drug Classes Is patient taking? Indication by Medication  Antipsychotic No   Anticoagulant No   Antibiotic No   Opioid No   Antiplatelet Yes Aspirin and Plavix x 3 months (06/22/23), then aspirin alone  Hypoglycemics/insulin No   Vasoactive Medication No   Chemotherapy No   Other Yes Tylenol, Voltaren gel: pain Crestor: hyperlipidemia Sertraline: depression Protonix: reflux Miralax: constipation VitD, Vit B-12: vitamins/supplements Zyrtec, Flonase: allergic rhinitis      Type of Medication Issue Identified Description of Issue Recommendation(s)  Drug Interaction(s) (clinically significant)     Duplicate Therapy     Allergy     No Medication Administration End Date     Incorrect Dose     Additional Drug Therapy Needed     Significant med changes from prior encounter (inform family/care partners about these prior to discharge).  Communicate medication changes with patient/family at discharge  Other       Clinically significant medication issues were identified that warrant physician communication and completion of prescribed/recommended actions by midnight of the next day:  No   Time spent performing this drug regimen review (minutes): 30   Thank you for allowing pharmacy to be a part of this patient's care.   Signe Colt, PharmD 04/05/2023 8:19 AM   **Pharmacist phone directory can be found on [email or web address] listed under [facility]**

## 2023-04-05 NOTE — Progress Notes (Signed)
INPATIENT REHABILITATION DISCHARGE NOTE   Discharge instructions by: Risa Grill, PA  Verbalized understanding: yes  Skin care/Wound care healing? none  Pain: none  IV's: none  Tubes/Drains: none  O2: none  Safety instructions: reviewed with pt and family  Patient belongings: sent with pt  Discharged to: home  Discharged via: family transport  Notes: done  Gerald Stabs, Therapist, sports

## 2023-04-05 NOTE — Progress Notes (Addendum)
PROGRESS NOTE   Subjective/Complaints: Patient going home today.  No new concerns.  Reports has not had a bowel movement in couple days but does not want any stronger medicines at the moment.  ROS: Patient denies fever, rash, sore throat, blurred vision, dizziness, nausea, vomiting, diarrhea, cough, shortness of breath or chest pain, joint or back/neck pain, headache, or mood change.    Objective:   No results found. Recent Labs    04/04/23 0704  WBC 5.5  HGB 11.7*  HCT 35.1*  PLT 212    Recent Labs    04/04/23 0704  NA 134*  K 4.1  CL 105  CO2 21*  GLUCOSE 96  BUN 20  CREATININE 1.03*  CALCIUM 9.4     Intake/Output Summary (Last 24 hours) at 04/05/2023 0826 Last data filed at 04/05/2023 0200 Gross per 24 hour  Intake 676 ml  Output 220 ml  Net 456 ml          Physical Exam: Vital Signs Blood pressure (!) 114/56, pulse 69, temperature 98.4 F (36.9 C), temperature source Oral, resp. rate 18, height 5\' 2"  (1.575 m), weight 61.5 kg, SpO2 100%.  Physical Exam   Constitutional: No distress . Vital signs reviewed. HEENT: NCAT, EOMI, oral membranes moist Neck: supple Cardiovascular: RRR without murmur. No JVD    Respiratory/Chest: CTA Bilaterally without wheezes or rales. Normal effort    GI/Abdomen: BS +, non-tender, non-distended Ext: no clubbing, cyanosis, or edema Psych: Very pleasant Skin: Warm and dry Neuro:     Mental Status: She is alert and awake    Comments: Pt alert and oriented. Reasonable insight and awareness. Normal language and speech. Functional memory. Mild right central 7. MMT: Moving all 4 extremities to gravity No abnormal resting tone noted. Good sitting balance EOB. Musculoskeletal:  Right shoulder tenderness with IR/ER. RA deformities of both hands/ulnar deviation  R Knee tenderness to palpation -improved     Assessment/Plan: 1. Functional deficits which require 3+ hours  per day of interdisciplinary therapy in a comprehensive inpatient rehab setting. Physiatrist is providing close team supervision and 24 hour management of active medical problems listed below. Physiatrist and rehab team continue to assess barriers to discharge/monitor patient progress toward functional and medical goals  Care Tool:  Bathing    Body parts bathed by patient: Right arm, Left arm, Chest, Abdomen, Front perineal area, Buttocks, Right upper leg, Left upper leg, Right lower leg, Left lower leg, Face         Bathing assist Assist Level: Supervision/Verbal cueing     Upper Body Dressing/Undressing Upper body dressing   What is the patient wearing?: Pull over shirt    Upper body assist Assist Level: Supervision/Verbal cueing    Lower Body Dressing/Undressing Lower body dressing      What is the patient wearing?: Underwear/pull up, Pants     Lower body assist Assist for lower body dressing: Supervision/Verbal cueing     Toileting Toileting Toileting Activity did not occur (Clothing management and hygiene only): N/A (no void or bm)  Toileting assist Assist for toileting: Supervision/Verbal cueing     Transfers Chair/bed transfer  Transfers assist     Chair/bed  transfer assist level: Independent with assistive device Chair/bed transfer assistive device:  (rollator)   Locomotion Ambulation   Ambulation assist      Assist level: Supervision/Verbal cueing Assistive device: Rollator Max distance: 150   Walk 10 feet activity   Assist     Assist level: Supervision/Verbal cueing Assistive device: Rollator   Walk 50 feet activity   Assist    Assist level: Supervision/Verbal cueing Assistive device: Rollator    Walk 150 feet activity   Assist    Assist level: Supervision/Verbal cueing Assistive device: Rollator    Walk 10 feet on uneven surface  activity   Assist     Assist level: Contact Guard/Touching assist Assistive device:  Rollator   Wheelchair     Assist Is the patient using a wheelchair?: No Type of Wheelchair: Manual    Wheelchair assist level: Total Assistance - Patient < 25%      Wheelchair 50 feet with 2 turns activity    Assist        Assist Level: Total Assistance - Patient < 25%   Wheelchair 150 feet activity     Assist      Assist Level: Total Assistance - Patient < 25%   Blood pressure (!) 114/56, pulse 69, temperature 98.4 F (36.9 C), temperature source Oral, resp. rate 18, height 5\' 2"  (1.575 m), weight 61.5 kg, SpO2 100%.  Medical Problem List and Plan: 1. Functional deficits secondary to left frontal and parietal lobe infarcts small vessel disease vs cardio-embolic due to A-fib.             -neurology recommends 30-day event monitor---set up with [facility] cardiology as outpt             -patient may  shower              -Resting hand splint ordered  -Estimated discharge 7/30  -DC home today   2.  Antithrombotics: -DVT/anticoagulation:  Pharmaceutical: Heparin             -antiplatelet therapy: Aspirin and Plavix for three months and then asa alone   3. Pain Management: Tylenol as needed, Voltaren gel to right knee as needed (she asks for these before therapy sessions). Also wants k-pad.   4. 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 Neuropsych/cognition: This patient is capable of making decisions on her own behalf.   6. Skin/Wound Care: Routine skin care checks   7. Fluids/Electrolytes/Nutrition: Routine Is and Os and follow-up chemistries   -Diet liberalized per patient request  7/29 diet already modified yesterday as noted above --consulted RD to provide some information on reduced sodium diet. We talked about some options in room today.  8:  Hypertension: monitor TID and prn             -no home meds/avoid hypotension  -7/ 30 controlled overall continue current regimen and follow-up with PCP     04/05/2023    5:15 AM 04/04/2023    9:02 PM 04/04/2023    1:27 PM  Vitals with BMI  Systolic 114 110 528  Diastolic 56 52 59  Pulse 69 71 90      9: Prediabetes:  A1c = 6.0%; blood glucose in  range currently             -carb modified diet   10: Hyperlipidemia: continue statin    11.  Orthostatic hypotension  -Start TED hose  -7/24 abdominal binder ordered, encourage oral fluid intake  -7/25 continue to encourage fluid intake, continue abdominal binder and TED hose when up  -7/26 continue to encourage fluid intake  12.  Right knee OA  -7/23 patient declines knee injection today, continue Voltaren.  She reports that she has a knee brace that helps  -7/30 she reports pain is controlled, continue current regimen  13.  Azotemia  -Creatinine slightly above her usual at 1.01 today, encourage oral fluid intake  -7/25 BUNs/CR slightly increased to 20/1.06, continue to encourage fluid intake  Recheck Monday BMP  -7/29 labs essentially within normal limits today  -Continue to encourage oral fluid intake  14.  Constipation  -7/24 last BM recorded yesterday-volume not documented.  Scheduled MiraLAX, continue Senokot  -7/25 last BM today improved, she still feels a little constipated but does not want any scheduled medication at this time, she will try diet modification  -7/28 declines additional scheduled medicine, discussed risk of constipation.  Discussed trying sorbitol as needed if no bowel movement tonight.   -Discussed she can use MiraLAX 2 times a day if needed to promote bowel movement  15.  Allergic rhinitis  -Zyrtec has been started with improvement  -Continues to be improved, continue Zyrtec  LOS: 8 days A FACE TO FACE EVALUATION WAS PERFORMED  Fanny Dance 04/05/2023, 8:26 AM

## 2023-04-07 ENCOUNTER — Encounter: Payer: Self-pay | Admitting: Nurse Practitioner

## 2023-04-08 ENCOUNTER — Ambulatory Visit: Payer: Medicare Other | Admitting: Nurse Practitioner

## 2023-04-08 ENCOUNTER — Telehealth: Payer: Self-pay

## 2023-04-08 ENCOUNTER — Encounter: Payer: Self-pay | Admitting: Nurse Practitioner

## 2023-04-08 VITALS — BP 122/74 | HR 85 | Temp 97.9°F | Ht 62.0 in | Wt 135.0 lb

## 2023-04-08 DIAGNOSIS — Z66 Do not resuscitate: Secondary | ICD-10-CM | POA: Diagnosis not present

## 2023-04-08 DIAGNOSIS — E7800 Pure hypercholesterolemia, unspecified: Secondary | ICD-10-CM

## 2023-04-08 DIAGNOSIS — F419 Anxiety disorder, unspecified: Secondary | ICD-10-CM | POA: Diagnosis not present

## 2023-04-08 DIAGNOSIS — M1711 Unilateral primary osteoarthritis, right knee: Secondary | ICD-10-CM | POA: Diagnosis not present

## 2023-04-08 DIAGNOSIS — I6930 Unspecified sequelae of cerebral infarction: Secondary | ICD-10-CM

## 2023-04-08 NOTE — Progress Notes (Signed)
Careteam: Patient Care Team: Sharon Seller, NP as PCP - General (Geriatric Medicine)  PLACE OF SERVICE:  [facility]  Advanced Directive information Does Patient Have a Medical Advance Directive?: Yes, Type of Advance Directive: Healthcare Power of [place];Out of facility DNR (pink MOST or yellow form), Pre-existing out of facility DNR order (yellow form or pink MOST form): Yellow form placed in chart (order not valid for inpatient use), Does patient want to make changes to medical advance directive?: No - Patient declined  No Known Allergies  Chief Complaint  Patient presents with   Hospitalization Follow-up    Follow-up from hospital stay 03/28/23-04/05/2023     HPI: Patient is a 89 y.o. female for hospital follow up  She went to ED after worsening right upper arm weakness and was noted to have acute CVA and placed on asa and plavix.  Neurology recommended 30 day heart monitoring.  Crestor was increased to 20 mg by mouth daily   Neurologist suggested her take zoloft 12.5 mg by mouth daily for post op stroke. She will be seeing neurologist later this month.  Daughter felt like this medication is helping.   She went to in patient rehab after hospitalization. She did very well in rehab She went home 4 days ago.  Home health therapy was not ordered  She is on a 2 weeks waiting period for outpatient therapy.   Pain in neck and knee has improved. Continues to use voltaren to knee and using tylenol to help with the pain.  Having an altered gait due to her OA of right knee.  Right arm with speech was only thing effected by stroke.   She has a lot of support from her family.    In the hospital she was on a heart healthy diet, neurologist told her the things that contributed to stroke- cholesterol and diet.  She consumes a lot of processed foods and take out.  Request nutritionist.     Review of Systems:  Review of Systems  Constitutional:  Negative for chills, fever  and weight loss.  HENT:  Negative for tinnitus.   Respiratory:  Negative for cough, sputum production and shortness of breath.   Cardiovascular:  Negative for chest pain, palpitations and leg swelling.  Gastrointestinal:  Negative for abdominal pain, constipation, diarrhea and heartburn.  Genitourinary:  Negative for dysuria, frequency and urgency.  Musculoskeletal:  Positive for joint pain. Negative for back pain, falls and myalgias.  Skin: Negative.   Neurological:  Positive for weakness. Negative for dizziness and headaches.  Psychiatric/Behavioral:  Negative for depression and memory loss. The patient does not have insomnia.     Past Medical History:  Diagnosis Date   Arthritis    Arthropathy, unspecified, site unspecified    Dr. Noel Gerold...right clavicular head swelling 2003   Colon polyp    colonoscopy 06-24-06 with no adenomatous change, hyperplasia  only   Environmental allergies    GERD (gastroesophageal reflux disease)    Healthcare maintenance    CPX 12-24-09, Td 1/05, pneumovax 2000 age 30, G[facility] [name] ([place])   Hyperlipidemia    target less than 160 > try off muscle aches 03-18-10   Morbid obesity (HCC)    ideal = 142.  target wt = 158 for BMI < 30   Osteopenia    BMD 08-07-08 tspin - 1.2, left femur - .4, right femur +.2.  repeat 01-02-10  1.9    0.1   0.5   Rhinitis, chronic  Dr. Linward Foster...on immunotherapy since 5/08   Past Surgical History:  Procedure Laterality Date   BUNIONECTOMY  1982   bilateral   CHOLECYSTECTOMY N/A 07/17/2020   Procedure: LAPAROSCOPIC CHOLECYSTECTOMY;  Surgeon: Axel Filler, MD;  Location: [facility] OR;  Service: General;  Laterality: N/A;   FOOT SURGERY  1970   bilateral 5th toes, bones removed   GANGLION CYST EXCISION  2000   3rd finger right hand   TUBAL LIGATION  1971   Social History:   reports that she quit smoking about 67 years ago. Her smoking use included cigarettes. She started smoking about 72 years ago. She has a 2.5  pack-year smoking history. She has never used smokeless tobacco. She reports that she does not drink alcohol and does not use drugs.  Family History  Problem Relation Age of Onset   Colon cancer Mother        (in computer for recall colonoscopy 10/12)   Healthy Daughter        x3   Healthy Son     Medications: Patient's Medications  New Prescriptions   No medications on file  Previous Medications   ACETAMINOPHEN (TYLENOL) 325 MG TABLET    Take 1-2 tablets (325-650 mg total) by mouth every 4 (four) hours as needed for mild pain.   ASPIRIN 81 MG CHEWABLE TABLET    Chew 1 tablet (81 mg total) by mouth daily.   CARBOXYMETHYLCELLUL-GLYCERIN (LUBRICATING EYE DROPS OP)    Place 1 drop into both eyes daily as needed (dry eyes).   CETIRIZINE (ZYRTEC) 10 MG TABLET    Take 10 mg by mouth at bedtime as needed for allergies (drippy nose, drainage, sneezing).   CHOLECALCIFEROL (VITAMIN D3) 25 MCG (1000 UT) CAPSULE    Take 1,000 Units by mouth daily.   CLOPIDOGREL (PLAVIX) 75 MG TABLET    Take 1 tablet (75 mg total) by mouth daily.   CYANOCOBALAMIN (B-12 PO)    Take 1 tablet by mouth daily.   DICLOFENAC SODIUM (VOLTAREN) 1 % GEL    Apply 2-4 g topically 4 (four) times daily as needed (joint pain).   FLUTICASONE (FLONASE) 50 MCG/ACT NASAL SPRAY    Place 1 spray into both nostrils daily as needed for allergies.   PANTOPRAZOLE (PROTONIX) 40 MG TABLET    TAKE 1 TABLET BY MOUTH EVERY DAY   POLYETHYLENE GLYCOL (MIRALAX / GLYCOLAX) 17 G PACKET    Take 17 g by mouth as needed.   ROSUVASTATIN (CRESTOR) 20 MG TABLET    Take 1 tablet (20 mg total) by mouth daily.   SERTRALINE (ZOLOFT) 25 MG TABLET    Take 0.5 tablets (12.5 mg total) by mouth daily.  Modified Medications   No medications on file  Discontinued Medications   POLYETHYLENE GLYCOL (MIRALAX / GLYCOLAX) 17 G PACKET    Take 17 g by mouth daily.    Physical Exam:  Vitals:   04/07/23 1603  BP: 122/74  Pulse: 85  Temp: 97.9 F (36.6 C)   TempSrc: Temporal  SpO2: 99%  Weight: 135 lb (61.2 kg)  Height: 5\' 2"  (1.575 m)   Body mass index is 24.8 kg/m. Wt Readings from Last 3 Encounters:  03/28/23 135 lb 9.3 oz (61.5 kg)  03/24/23 143 lb 15.4 oz (65.3 kg)  03/18/23 144 lb (65.3 kg)    Physical Exam Constitutional:      General: She is not in acute distress.    Appearance: She is well-developed. She is not diaphoretic.  HENT:  Head: Normocephalic and atraumatic.     Mouth/Throat:     Pharynx: No oropharyngeal exudate.  Eyes:     Conjunctiva/sclera: Conjunctivae normal.     Pupils: Pupils are equal, round, and reactive to light.  Cardiovascular:     Rate and Rhythm: Normal rate and regular rhythm.     Heart sounds: Normal heart sounds.  Pulmonary:     Effort: Pulmonary effort is normal.     Breath sounds: Normal breath sounds.  Abdominal:     General: Bowel sounds are normal.     Palpations: Abdomen is soft.  Musculoskeletal:     Cervical back: Normal range of motion and neck supple.     Right lower leg: No edema.     Left lower leg: No edema.  Skin:    General: Skin is warm and dry.  Neurological:     Mental Status: She is alert.     Motor: Weakness (to right hand, decrease ROM to right arm) present.  Psychiatric:        Mood and Affect: Mood normal.     Labs reviewed: Basic Metabolic Panel: Recent Labs    04/29/22 1412 08/20/22 1352 03/18/23 1354 03/24/23 1240 03/29/23 0732 03/31/23 0820 04/04/23 0704  NA 138   < > 141   < > 135 136 134*  K 3.7   < > 4.1   < > 4.1 4.4 4.1  CL 104   < > 106   < > 105 104 105  CO2 27   < > 27   < > 21* 23 21*  GLUCOSE 87   < > 80   < > 113* 149* 96  BUN 18   < > 15   < > 19 20 20   CREATININE 0.84   < > 0.74   < > 1.01* 1.06* 1.03*  CALCIUM 9.7   < > 9.6   < > 9.0 9.6 9.4  TSH 1.34  --  1.43  --   --   --   --    < > = values in this interval not displayed.   Liver Function Tests: Recent Labs    04/29/22 1414 08/20/22 1352 03/18/23 1354  03/24/23 1240 03/29/23 0732  AST 16   < > 19 25 18   ALT 13   < > 17 30 21   ALKPHOS 63  --   --  55 49  BILITOT 0.9   < > 0.8 0.8 0.7  PROT 7.3   < > 6.4 7.3 6.3*  ALBUMIN 4.4  --   --  4.0 3.4*   < > = values in this interval not displayed.   No results for input(s): "LIPASE", "AMYLASE" in the last 8760 hours. No results for input(s): "AMMONIA" in the last 8760 hours. CBC: Recent Labs    03/18/23 1354 03/24/23 1240 03/24/23 1753 03/29/23 0732 04/04/23 0704  WBC 3.9 7.5 7.0 5.5 5.5  NEUTROABS 1,833 5.1  --  2.7  --   HGB 11.8 13.9 13.1 12.7 11.7*  HCT 35.2 41.1 39.1 38.4 35.1*  MCV 96.7 97.2 96.3 97.5 96.7  PLT 225 216 231 216 212   Lipid Panel: Recent Labs    08/20/22 1352 11/22/22 1334 03/25/23 0309  CHOL 280* 206* 225*  HDL 77 90 77  LDLCALC 185* 101* 131*  TRIG 74 61 86  CHOLHDL 3.6 2.3 2.9   TSH: Recent Labs    04/29/22 1412 03/18/23 1354  TSH 1.34 1.43  A1C: Lab Results  Component Value Date   HGBA1C 6.0 (H) 03/24/2023     Assessment/Plan 1. Late effect of cerebrovascular accident (CVA) -strength and mobility has improved. She continues with right arm weakness.  Continues with outpatient PT.  -on plavix and asa for 3 months.  Has follow up with neurology and physical medicine scheduled - Amb ref to Medical Nutrition Therapy-MNT- for help on food choices   2. Do not resuscitate - Do not attempt resuscitation (DNR)  3. Primary osteoarthritis of right knee Continues with therapy, tylenol and voltaren gel.   4. Anxiety -doing well on zoloft, will continue at this time.   5. Pure hypercholesterolemia Has increase crestor to 20 mg daily, continue at this time, goal LDL <70.   07/15/2023 as scheduled for follow up Mckinsley Koelzer K. Biagio Borg [facility] [phone]

## 2023-04-08 NOTE — Transitions of Care (Post Inpatient/ED Visit) (Signed)
   04/08/2023  Name: [name] MRN: [id number] DOB: Jun 19, 1931  Today's TOC FU Call Status: Today's TOC FU Call Status:: Successful TOC FU Call Completed TOC FU Call Complete Date: 04/07/23  Transition Care Management Follow-up Telephone Call Date of Discharge: 04/04/21 Discharge Facility: [facility]) Type of Discharge: Inpatient Admission Primary Inpatient Discharge Diagnosis:: CVA How have you been since you were released from the hospital?: Better Any questions or concerns?: No  Items Reviewed: Did you receive and understand the discharge instructions provided?: Yes Medications obtained,verified, and reconciled?: Yes (Medications Reviewed) Any new allergies since your discharge?: No Dietary orders reviewed?: NA Do you have support at home?: Yes People in Home: child(ren), adult  Medications Reviewed Today: Medications Reviewed Today   Medications were not reviewed in this encounter     Home Care and Equipment/Supplies: Were Home Health Services Ordered?: NA Any new equipment or medical supplies ordered?: NA  Functional Questionnaire: Do you need assistance with bathing/showering or dressing?: No Do you need assistance with meal preparation?: No Do you need assistance with eating?: No Do you have difficulty maintaining continence: No Do you need assistance with getting out of bed/getting out of a chair/moving?: No Do you have difficulty managing or taking your medications?: No  Follow up appointments reviewed: PCP Follow-up appointment confirmed?: Yes Date of PCP follow-up appointment?: 04/08/23 Follow-up Provider: Wyatt Mage [facility] Follow-up appointment confirmed?: NA Do you need transportation to your follow-up appointment?: No Do you understand care options if your condition(s) worsen?: Yes-patient verbalized understanding    SIGNATURE: [facility]

## 2023-04-09 DIAGNOSIS — I639 Cerebral infarction, unspecified: Secondary | ICD-10-CM

## 2023-04-18 ENCOUNTER — Encounter: Payer: Self-pay | Admitting: Registered Nurse

## 2023-04-18 ENCOUNTER — Encounter: Payer: Medicare Other | Attending: Registered Nurse | Admitting: Registered Nurse

## 2023-04-18 VITALS — BP 123/66 | HR 73 | Ht 62.0 in | Wt 134.6 lb

## 2023-04-18 DIAGNOSIS — I639 Cerebral infarction, unspecified: Secondary | ICD-10-CM

## 2023-04-18 DIAGNOSIS — E7849 Other hyperlipidemia: Secondary | ICD-10-CM | POA: Diagnosis not present

## 2023-04-18 NOTE — Progress Notes (Unsigned)
Subjective:    Patient ID: [name], female    DOB: 22-Nov-1930, 89 y.o.   MRN: [id number]  HPI: [name] is a 89 y.o. female who is here for HFU appointment for her CVA and hyperlipidemia.  She presented to ED on 03/24/2023 with complaints of worsening of right upper arm weakness.   DR Maisie Fus H&P 03/24/2023 HPI: [name] is a 89 y.o. female with medical history significant of  GERD, HLD, morbid obesity ,  djd of the spine with chronic neck pain as well as radiculopathy of right upper extremity for which she was evaluated by neurosurgery due to Spondylolisthesis of cervical spine. Patient of note has interim history of office visit 03/18/23 with complaints of dizziness and right upper extremity more so in right hand few days prior to visit. At that visit she was thought to have exacerbation of her know cervical radiculopathy and was prescribed steroid pack.  Patient it appears since then has had persistent symptoms which acutely got worse hours prior to presentation to [facility]. Per family patient was noted to have episode of word finding difficulties two days ago which was transient and resolved on its own. However today hours PTA patient was noted to have persistent right arm weakness now associated with tingling and numbness of her right leg as well as questionable right side facial paresthesias.  CT Head: WO Contrast:  IMPRESSION: 1. Small focus of hypodensity within the left frontal lobe white matter, which may reflect small vessel ischemic disease or an acute white matter infarct. Consider a brain MRI for further evaluation. 2. No acute intracranial hemorrhage or acute demarcated cortical infarct.  MR Brain: WO Contrast IMPRESSION: 1. Scattered patchy acute to early subacute ischemic infarcts involving the cortical and subcortical aspect of the left frontal and parietal lobes. No associated hemorrhage or significant regional mass effect. 2.  Underlying mild chronic microvascular ischemic disease.   CTA: Head and Neck:  IMPRESSION: 1. Acute left cerebral infarcts are associated with advanced atheromatous narrowing of the left M1 segment. 2. Mild for age atherosclerosis in the neck although there is high-grade narrowing at the left vertebral origin.   MR Cervical Spine:  IMPRESSION: 1. No acute abnormality within the cervical spine or spinal cord. 2. Multilevel cervical spondylosis with resultant mild to moderate spinal stenosis at C5-6 and C6-7. 3. Multifactorial degenerative changes with resultant multilevel foraminal narrowing as above. Notable findings include mild left C3 and right C5 foraminal stenosis, severe right worse than left C6 foraminal narrowing, with moderate left and mild to moderate right C7 foraminal stenosis.  Neurology Consulted: She is wearing 30 day heart monitor ( Zio Patch). Continue DAPT for 3 months.   [name] was admitted to inpatient Rehabilitation on 03/28/2023 and discharged home on 04/05/2023.She is scheduled for outptient therapy at [facility] in [place]. She denies any pain at this time. She rated her pain 5 on Health and History Form.   Also reports her appetite is poor, she was encouraged to eat small frequent meals as tolerated, she verbalizes understanding. She will F/U with her PCP.   Daughter in room all questions answered.   Pain Inventory Average Pain 8 Pain Right Now 5 My pain is intermittent, burning, tingling, aching, and knee and ankle  LOCATION OF PAIN  hand fingers knee and ankle  BOWEL Number of stools per week: 7 Oral laxative use Yes  Type of laxative Miralax if needed  BLADDER  Pads  Mobility walk with assistance use a walker how many minutes can you walk? 3 ability to climb steps?  no do you drive?  no  Function retired I need assistance with the following:  dressing, bathing, toileting, meal prep, household duties, and  shopping  Neuro/Psych weakness numbness tingling dizziness anxiety loss of taste or smell  Prior Studies Any changes since last visit?  no  Physicians involved in your care Any changes since last visit?  no   Family History  Problem Relation Age of Onset   Colon cancer Mother        (in computer for recall colonoscopy 10/12)   Healthy Daughter        x3   Healthy Son    Social History   Socioeconomic History   Marital status: Widowed    Spouse name: Not on file   Number of children: 4   Years of education: Not on file   Highest education level: Some college, no degree  Occupational History   Not on file  Tobacco Use   Smoking status: Former    Current packs/day: 0.00    Average packs/day: 0.5 packs/day for 5.0 years (2.5 ttl pk-yrs)    Types: Cigarettes    Start date: 09/06/1950    Quit date: 09/07/1955    Years since quitting: 67.6   Smokeless tobacco: Never  Vaping Use   Vaping status: Never Used  Substance and Sexual Activity   Alcohol use: No    Alcohol/week: 0.0 standard drinks of alcohol   Drug use: No   Sexual activity: Not Currently  Other Topics Concern   Not on file  Social History Narrative   Exercise - 2 times weekly   Caffeine - 2 cups daily   Negative history of passive tobacco smoke exposure   Lives with husband of 59 years in one story home.   Has 4 children.   Retired from [employer].    Social Determinants of Health   Financial Resource Strain: Low Risk  (03/07/2023)   Overall Financial Resource Strain (CARDIA)    Difficulty of Paying Living Expenses: Not hard at all  Food Insecurity: No Food Insecurity (03/24/2023)   Hunger Vital Sign    Worried About Running Out of Food in the Last Year: Never true    Ran Out of Food in the Last Year: Never true  Transportation Needs: No Transportation Needs (03/24/2023)   PRAPARE - Administrator, Civil Service (Medical): No    Lack of Transportation (Non-Medical): No  Physical  Activity: Unknown (03/07/2023)   Exercise Vital Sign    Days of Exercise per Week: 0 days    Minutes of Exercise per Session: Not on file  Stress: Stress Concern Present (03/07/2023)   [employer] of Occupational Health - Occupational Stress Questionnaire    Feeling of Stress : Very much  Social Connections: Moderately Integrated (03/07/2023)   Social Connection and Isolation Panel [NHANES]    Frequency of Communication with Friends and Family: More than three times a week    Frequency of Social Gatherings with Friends and Family: More than three times a week    Attends Religious Services: More than 4 times per year    Active Member of [employer] or Organizations: Yes    Attends Banker Meetings: More than 4 times per year    Marital Status: Widowed   Past Surgical History:  Procedure Laterality Date   BUNIONECTOMY  1982   bilateral  CHOLECYSTECTOMY N/A 07/17/2020   Procedure: LAPAROSCOPIC CHOLECYSTECTOMY;  Surgeon: Axel Filler, MD;  Location: [facility] OR;  Service: General;  Laterality: N/A;   FOOT SURGERY  1970   bilateral 5th toes, bones removed   GANGLION CYST EXCISION  2000   3rd finger right hand   TUBAL LIGATION  1971   Past Medical History:  Diagnosis Date   Arthritis    Arthropathy, unspecified, site unspecified    Dr. Noel Gerold...right clavicular head swelling 2003   Colon polyp    colonoscopy 06-24-06 with no adenomatous change, hyperplasia  only   Environmental allergies    GERD (gastroesophageal reflux disease)    Healthcare maintenance    CPX 12-24-09, Td 1/05, pneumovax 2000 age 70, GYN [name] ([place])   Hyperlipidemia    target less than 160 > try off muscle aches 03-18-10   Morbid obesity (HCC)    ideal = 142.  target wt = 158 for BMI < 30   Osteopenia    BMD 08-07-08 tspin - 1.2, left femur - .4, right femur +.2.  repeat 01-02-10  1.9    0.1   0.5   Rhinitis, chronic    Dr. Linward Foster...on immunotherapy since 5/08   BP 129/77   Pulse 73   Ht  5\' 2"  (1.575 m)   Wt 134 lb 9.6 oz (61.1 kg)   SpO2 98%   BMI 24.62 kg/m   Opioid Risk Score:   Fall Risk Score:  `1  Depression screen [facility] 2/9     04/18/2023   11:30 AM 12/06/2022    8:40 AM 08/20/2022    1:09 PM  Depression screen PHQ 2/9  Decreased Interest 0 0 0  Down, Depressed, Hopeless 0 0 0  PHQ - 2 Score 0 0 0  Altered sleeping 0    Tired, decreased energy 1    Change in appetite 1    Feeling bad or failure about yourself  0    Trouble concentrating 0    Moving slowly or fidgety/restless 1    Suicidal thoughts 0    PHQ-9 Score 3    Difficult doing work/chores Somewhat difficult       Review of Systems  Constitutional:  Positive for appetite change.       Poor appetite and wt loss  HENT: Negative.    Eyes: Negative.   Respiratory: Negative.    Cardiovascular: Negative.   Gastrointestinal:  Positive for abdominal pain.  Endocrine: Negative.   Genitourinary:  Positive for difficulty urinating.  Musculoskeletal:  Positive for arthralgias and gait problem.  Skin: Negative.   Allergic/Immunologic: Negative.   Neurological:  Positive for dizziness, weakness and numbness.       Tingling  Hematological:  Bruises/bleeds easily.       Plavix  Psychiatric/Behavioral:  The patient is nervous/anxious.   All other systems reviewed and are negative.      Objective:   Physical Exam Vitals and nursing note reviewed.  Constitutional:      Appearance: Normal appearance.  Cardiovascular:     Rate and Rhythm: Normal rate and regular rhythm.     Pulses: Normal pulses.     Heart sounds: Normal heart sounds.  Musculoskeletal:     Cervical back: Normal range of motion and neck supple.     Comments: Normal Muscle Bulk and Muscle Testing Reveals:  Upper Extremities: Right: Decreased ROM  90 Degrees and Muscle Strength 4/5 Left Upper Extremity: Full ROM and Muscle Strength 5/5  Lower Extremities: Right:  Decreased ROM and Muscle Strength 5/5  Right Lower Extremity Flexion  Produces Pain into her Right Patella Left Lower extremity: Full ROM and Muscle Strength 5/5 Arises from Table slowly using walker for support Narrow Based  Gait     Skin:    General: Skin is warm and dry.  Neurological:     Mental Status: She is alert and oriented to person, place, and time.  Psychiatric:        Mood and Affect: Mood normal.        Behavior: Behavior normal.         Assessment & Plan:  Cerebral Vascular Accident: Schedule for Outpatient Therapy at [facility]. She has a scheduled appointment with Dr Terrace Arabia. Continue current medication regimen. Continue to monitor.  Hyperlipidemia: Continue current medication regimen. Continue to Monitor.   F/U with Dr. Natale Lay in 4- 6 weeks

## 2023-04-28 ENCOUNTER — Encounter: Payer: Self-pay | Admitting: Nurse Practitioner

## 2023-04-28 ENCOUNTER — Other Ambulatory Visit: Payer: Self-pay

## 2023-04-28 MED ORDER — CLOPIDOGREL BISULFATE 75 MG PO TABS: 75.0000 mg | ORAL_TABLET | Freq: Every day | ORAL | 1 refills | Status: DC

## 2023-04-28 MED ORDER — SERTRALINE HCL 25 MG PO TABS: 12.5000 mg | ORAL_TABLET | Freq: Every day | ORAL | 1 refills | Status: DC

## 2023-05-12 ENCOUNTER — Ambulatory Visit: Payer: Medicare Other | Attending: Internal Medicine

## 2023-05-12 DIAGNOSIS — I639 Cerebral infarction, unspecified: Secondary | ICD-10-CM

## 2023-05-27 ENCOUNTER — Telehealth: Payer: Self-pay

## 2023-05-27 NOTE — Telephone Encounter (Signed)
Called Angela and no answer. Will notify her when back in office.

## 2023-05-27 NOTE — Telephone Encounter (Signed)
Called Angela and no answer. Voicemail was left with office call back number.

## 2023-05-27 NOTE — Telephone Encounter (Signed)
Would recommend OV to discuss and evaluate.

## 2023-05-27 NOTE — Telephone Encounter (Signed)
[name] the occupational therapist called and states that patient reported recent spasms in right hand which was the side she had stroke on. She reports recently having spasms with mouth as well. Therapist wanted to know if this is normal and what should she do? Message routed to PCP Janyth Contes, [name], NP.

## 2023-05-30 ENCOUNTER — Encounter: Payer: Medicare Other | Attending: Registered Nurse | Admitting: Physical Medicine & Rehabilitation

## 2023-05-30 ENCOUNTER — Encounter: Payer: Self-pay | Admitting: Physical Medicine & Rehabilitation

## 2023-05-30 VITALS — BP 127/75 | HR 71 | Ht 62.0 in | Wt 135.8 lb

## 2023-05-30 DIAGNOSIS — I63412 Cerebral infarction due to embolism of left middle cerebral artery: Secondary | ICD-10-CM | POA: Diagnosis present

## 2023-05-30 DIAGNOSIS — I10 Essential (primary) hypertension: Secondary | ICD-10-CM | POA: Diagnosis present

## 2023-05-30 DIAGNOSIS — M79601 Pain in right arm: Secondary | ICD-10-CM | POA: Insufficient documentation

## 2023-05-30 DIAGNOSIS — F39 Unspecified mood [affective] disorder: Secondary | ICD-10-CM | POA: Diagnosis present

## 2023-05-30 DIAGNOSIS — M1711 Unilateral primary osteoarthritis, right knee: Secondary | ICD-10-CM | POA: Insufficient documentation

## 2023-05-30 NOTE — Progress Notes (Signed)
Subjective:    Patient ID: [name], female    DOB: 09-29-1930, 89 y.o.   MRN: [id number]  HPI   Hospital Discharge summary 04/05/23   Brief HPI:   [name] is a 89 y.o. female presented to [facility] health emergency department at [facility] on 03/24/2023 complaining of worsening right upper arm weakness. She had been reporting the symptoms for approximately 2 months per family report. She was recently treated for cervical spondylolisthesis and radiculopathy as well as dizziness. First son stated 11 hours prior to admission her right arm weakness worsened significantly. Stroke was activated and she was seen in consultation by Dr. Selina Cooley. She was out of the window for tenecteplase. Aspirin and Plavix started. CTA was not performed as part of her code stroke because exam was not consistent with LVO. Brain revealed scattered acute to subacute patchy ischemic infarctions evolving cortical and subcortical aspect of the left frontal and parietal lobes. No hemorrhage noted. MRI of the C-spine without acute abnormality. She underwent 2D echo with ejection fraction of approximately 60 to 65%. LDL 131 and hemoglobin A1c 6.0%. She was started on heparin subcutaneously for VTE prophylaxis. Neurology recommends a 30-day heart monitor on discharge. Her social history is significant for loss of her husband in January of this year after 60+ years of marriage and she is exhibiting significant depression. Zoloft started. Neurology plans to continue DAPT for 3 months./OT/SLP evaluations obtained. He is tolerating regular texture diet. Currently requiring min assist for bed mobility and transfers, using quite cane for ambulation.      Hospital Course: [name] was admitted to rehab 03/28/2023 for inpatient therapies to consist of PT, ST and OT at least three hours five days a week. Past admission physiatrist, therapy team and rehab RN have worked together to provide customized collaborative  inpatient rehab. Wrist cock-up splint applied to right. Patient requested to have Zoloft decreased to 12.5 mg as she was concerned this was causing dizziness. Zyrtec given for allergic rhinitis with improvement. BUN has normalized and serum creatinine is in the range of 1.01-1.06. Serum glucose range is 96-149.    Blood pressures were monitored on TID basis and found to have orthostatic hypotension. TED hose and abdominal binder placed. Encouraged PO fluids.   Rehab course: During patient's stay in rehab weekly team conferences were held to monitor patient's progress, set goals and discuss barriers to discharge. At admission, patient required min with mobility, min A with basic self-care skills.   She has had improvement in activity tolerance, balance, postural control as well as ability to compensate for deficits. She has had improvement in functional use RUE/LUE  and RLE/LLE as well as improvement in awareness. Patient has met 11 of 11 long term goals due to improved activity tolerance, improved balance, increased strength, ability to compensate for deficits, improved attention, improved awareness, and improved coordination.  Patient to discharge at an ambulatory level Supervision.   Patient's care partner is independent to provide the necessary physical assistance at discharge   Interval History 05/30/23 [name] is here for follow-up after CVA and completion of treatment at [facility].  She is here with her daughter and and they are both very pleasant.  [name] had been working with outpatient physical and Occupational Therapy.  She reports she is over all making progress with her function and is doing well overall since her stroke.  She does continue to have some pain in her right arm since the  stroke.  She wears a wrist brace on her right upper extremity.  She is working with occupational therapy and different techniques to help relieve this.  She is ambulating with a rollator.  She has stopped using  her Zoloft, patient feels that she is not depressed.  Patient's daughter thinks that her mood may continue to be decreased from her prior baseline.  Patient reports that her greatest pain currently is her right knee.  She has used IcyHot, Aspercreme, Biofreeze for her knee pain.  She has also had oral steroids for this issue.  She has previously declined steroid injections.    Pain Inventory Average Pain 5 Pain Right Now 5 My pain is sharp, burning, and stabbing  In the last 24 hours, has pain interfered with the following? General activity 4 Relation with others 4 Enjoyment of life 1 What TIME of day is your pain at its worst? night Sleep (in general) Fair  Pain is worse with: bending, inactivity, standing, and some activites Pain improves with: heat/ice, therapy/exercise, medication, and injections Relief from Meds: 7  Family History  Problem Relation Age of Onset   Colon cancer Mother        (in computer for recall colonoscopy 10/12)   Healthy Daughter        x3   Healthy Son    Social History   Socioeconomic History   Marital status: Widowed    Spouse name: Not on file   Number of children: 4   Years of education: Not on file   Highest education level: Some college, no degree  Occupational History   Not on file  Tobacco Use   Smoking status: Former    Current packs/day: 0.00    Average packs/day: 0.5 packs/day for 5.0 years (2.5 ttl pk-yrs)    Types: Cigarettes    Start date: 09/06/1950    Quit date: 09/07/1955    Years since quitting: 67.7   Smokeless tobacco: Never  Vaping Use   Vaping status: Never Used  Substance and Sexual Activity   Alcohol use: No    Alcohol/week: 0.0 standard drinks of alcohol   Drug use: No   Sexual activity: Not Currently  Other Topics Concern   Not on file  Social History Narrative   Exercise - 2 times weekly   Caffeine - 2 cups daily   Negative history of passive tobacco smoke exposure   Lives with husband of 59 years in one  story home.   Has 4 children.   Retired from [employer].    Social Determinants of Health   Financial Resource Strain: Low Risk  (03/07/2023)   Overall Financial Resource Strain (CARDIA)    Difficulty of Paying Living Expenses: Not hard at all  Food Insecurity: No Food Insecurity (03/24/2023)   Hunger Vital Sign    Worried About Running Out of Food in the Last Year: Never true    Ran Out of Food in the Last Year: Never true  Transportation Needs: No Transportation Needs (03/24/2023)   PRAPARE - Administrator, Civil Service (Medical): No    Lack of Transportation (Non-Medical): No  Physical Activity: Unknown (03/07/2023)   Exercise Vital Sign    Days of Exercise per Week: 0 days    Minutes of Exercise per Session: Not on file  Stress: Stress Concern Present (03/07/2023)   [employer] of Occupational Health - Occupational Stress Questionnaire    Feeling of Stress : Very much  Social Connections: Moderately Integrated (  03/07/2023)   Social Connection and Isolation Panel [NHANES]    Frequency of Communication with Friends and Family: More than three times a week    Frequency of Social Gatherings with Friends and Family: More than three times a week    Attends Religious Services: More than 4 times per year    Active Member of [employer] or Organizations: Yes    Attends Banker Meetings: More than 4 times per year    Marital Status: Widowed   Past Surgical History:  Procedure Laterality Date   BUNIONECTOMY  1982   bilateral   CHOLECYSTECTOMY N/A 07/17/2020   Procedure: LAPAROSCOPIC CHOLECYSTECTOMY;  Surgeon: Axel Filler, MD;  Location: [facility] OR;  Service: General;  Laterality: N/A;   FOOT SURGERY  1970   bilateral 5th toes, bones removed   GANGLION CYST EXCISION  2000   3rd finger right hand   TUBAL LIGATION  1971   Past Surgical History:  Procedure Laterality Date   BUNIONECTOMY  1982   bilateral   CHOLECYSTECTOMY N/A 07/17/2020   Procedure:  LAPAROSCOPIC CHOLECYSTECTOMY;  Surgeon: Axel Filler, MD;  Location: [facility] OR;  Service: General;  Laterality: N/A;   FOOT SURGERY  1970   bilateral 5th toes, bones removed   GANGLION CYST EXCISION  2000   3rd finger right hand   TUBAL LIGATION  1971   Past Medical History:  Diagnosis Date   Arthritis    Arthropathy, unspecified, site unspecified    Dr. Noel Gerold...right clavicular head swelling 2003   Colon polyp    colonoscopy 06-24-06 with no adenomatous change, hyperplasia  only   Environmental allergies    GERD (gastroesophageal reflux disease)    Healthcare maintenance    CPX 12-24-09, Td 1/05, pneumovax 2000 age 40, GYN [name] ([place])   Hyperlipidemia    target less than 160 > try off muscle aches 03-18-10   Morbid obesity (HCC)    ideal = 142.  target wt = 158 for BMI < 30   Osteopenia    BMD 08-07-08 tspin - 1.2, left femur - .4, right femur +.2.  repeat 01-02-10  1.9    0.1   0.5   Rhinitis, chronic    Dr. Linward Foster...on immunotherapy since 5/08   BP 127/75   Pulse 71   Ht 5\' 2"  (1.575 m)   Wt 135 lb 12.8 oz (61.6 kg)   SpO2 100%   BMI 24.84 kg/m   Opioid Risk Score:   Fall Risk Score:  `1  Depression screen [facility] 2/9     04/18/2023   11:30 AM 12/06/2022    8:40 AM 08/20/2022    1:09 PM  Depression screen PHQ 2/9  Decreased Interest 0 0 0  Down, Depressed, Hopeless 0 0 0  PHQ - 2 Score 0 0 0  Altered sleeping 0    Tired, decreased energy 1    Change in appetite 1    Feeling bad or failure about yourself  0    Trouble concentrating 0    Moving slowly or fidgety/restless 1    Suicidal thoughts 0    PHQ-9 Score 3    Difficult doing work/chores Somewhat difficult        Review of Systems  Musculoskeletal:        Right shoulder pain Right knee pain  All other systems reviewed and are negative.     Objective:   Physical Exam    05/30/2023   10:32 AM 04/18/2023   12:04 PM  04/18/2023   11:29 AM  Vitals with BMI  Height 5\' 2"   5\' 2"   Weight 135  lbs 13 oz  134 lbs 10 oz  BMI 24.83  24.61  Systolic 127 123 865  Diastolic 75 66 77  Pulse 71  73    Gen: no distress, normal appearing HEENT: oral mucosa pink and moist, NCAT Chest: normal effort, normal rate of breathing Abd: soft, non-distended Ext: no edema Psych: pleasant, normal affect, very pleasant  Skin: intact Neuro: Alert and oriented, normal speech and language.  Fair insight and awareness.  No dysarthria or aphasia noted. Strength 5 out of 5 left upper extremity Strength 4 out of 5 right upper extremity Strength bilateral lower extremities 4+ out of 5 Sensation intact to light touch in all 4 extremities. Musculoskeletal:  Walks with a walker Right knee tender to palpation anteriorly Arthritic changes noted in bilateral hands     Assessment & Plan:    1) Left frontal and parietal lobe infarcts small vessel disease vs cardio-embolic due to A-fib.   -Continue outpatient PT/OT  -Continue follow-up with cardiology for event monitor and neurology outpatient  2)HTN  -Well-controlled continue to follow-up with PCP  3) Right knee pain likely due to OA/right upper extremity pain  -Continue Voltaren gel  -Discussed trying cortisone knee injection.,  Discussed risks and benefits.  Will schedule at next visit however patient says she will think about this further  4) Mood disorder  -Patient orts she is not depressed, however daughter feels that mood is not at baseline.  I think it be reasonable to continue low-dose sertraline 12.5 mg.  Continue follow-up with PCP.

## 2023-05-30 NOTE — Telephone Encounter (Signed)
Can one of you try calling patient to see if she will answer the phone and come into office for visit?

## 2023-05-31 NOTE — Telephone Encounter (Signed)
Thank you :)

## 2023-06-02 ENCOUNTER — Encounter: Payer: Self-pay | Admitting: Nurse Practitioner

## 2023-06-03 ENCOUNTER — Encounter: Payer: Self-pay | Admitting: Nurse Practitioner

## 2023-06-03 ENCOUNTER — Ambulatory Visit: Payer: Medicare Other | Admitting: Nurse Practitioner

## 2023-06-03 VITALS — BP 122/78 | HR 76 | Temp 97.2°F | Ht 62.0 in | Wt 136.4 lb

## 2023-06-03 DIAGNOSIS — E7800 Pure hypercholesterolemia, unspecified: Secondary | ICD-10-CM | POA: Diagnosis not present

## 2023-06-03 DIAGNOSIS — I63412 Cerebral infarction due to embolism of left middle cerebral artery: Secondary | ICD-10-CM | POA: Diagnosis not present

## 2023-06-03 DIAGNOSIS — N1831 Chronic kidney disease, stage 3a: Secondary | ICD-10-CM

## 2023-06-03 DIAGNOSIS — K219 Gastro-esophageal reflux disease without esophagitis: Secondary | ICD-10-CM | POA: Diagnosis not present

## 2023-06-03 DIAGNOSIS — F32A Depression, unspecified: Secondary | ICD-10-CM | POA: Diagnosis not present

## 2023-06-03 DIAGNOSIS — E871 Hypo-osmolality and hyponatremia: Secondary | ICD-10-CM | POA: Insufficient documentation

## 2023-06-03 DIAGNOSIS — N183 Chronic kidney disease, stage 3 unspecified: Secondary | ICD-10-CM | POA: Insufficient documentation

## 2023-06-03 MED ORDER — METHOCARBAMOL 500 MG PO TABS: 500.0000 mg | ORAL_TABLET | Freq: Three times a day (TID) | ORAL | 0 refills | Status: DC | PRN

## 2023-06-03 NOTE — Assessment & Plan Note (Addendum)
c/o R hand spasm, may try Robaxin prn, update CMP/eGFR, Mg  CVA, working with therapy, followed by Neurology, on ASA, Plavix, Statin

## 2023-06-03 NOTE — Assessment & Plan Note (Signed)
Bun/creat 20/1.03 04/04/23

## 2023-06-03 NOTE — Assessment & Plan Note (Signed)
mild Na 134 04/04/23

## 2023-06-03 NOTE — Assessment & Plan Note (Signed)
Stable,  taking Pantoprazole, Hgb 11.7 04/04/23

## 2023-06-03 NOTE — Assessment & Plan Note (Signed)
Stable,  taking Sertraline. TSH 1.43 03/18/23

## 2023-06-03 NOTE — Assessment & Plan Note (Signed)
on Rosuvastatin, LDL 131 03/25/23

## 2023-06-03 NOTE — Progress Notes (Signed)
Location:   Clinic [facility]   Place of Service:    Provider: Chipper Oman NP  Code Status: DNR Goals of Care:     06/02/2023    3:53 PM  Advanced Directives  Does Patient Have a Medical Advance Directive? Yes  Type of Estate agent of [place];Out of facility DNR (pink MOST or yellow form)  Does patient want to make changes to medical advance directive? No - Patient declined  Copy of Healthcare Power of Attorney in Chart? Yes - validated most recent copy scanned in chart (See row information)  Pre-existing out of facility DNR order (yellow form or pink MOST form) Yellow form placed in chart (order not valid for inpatient use)     Chief Complaint  Patient presents with   Acute Visit    Patient c/o spasms in right hand, right knee pain, and mouth twitching. Patient d/c'ed zoloft (stopped on her own)     HPI: Patient is a 89 y.o. female seen today for an acute visit for c/o R hand spasm  CVA, working with therapy, followed by Neurology, on ASA, Plavix, Statin, R sided weakness with muscle strength 5/5, R ankle brace.   HLD, on Rosuvastatin, LDL 131 03/25/23  GERD, taking Pantoprazole, Hgb 11.7 04/04/23  Depression, taking Sertraline. TSH 1.43 03/18/23  Hyponatremia, mild Na 134 04/04/23  CKD Bun/creat 20/1.03 04/04/23    Past Medical History:  Diagnosis Date   Arthritis    Arthropathy, unspecified, site unspecified    Dr. Noel Gerold...right clavicular head swelling 2003   Colon polyp    colonoscopy 06-24-06 with no adenomatous change, hyperplasia  only   Environmental allergies    GERD (gastroesophageal reflux disease)    Healthcare maintenance    CPX 12-24-09, Td 1/05, pneumovax 2000 age 60, G[facility] [name] ([place])   Hyperlipidemia    target less than 160 > try off muscle aches 03-18-10   Morbid obesity (HCC)    ideal = 142.  target wt = 158 for BMI < 30   Osteopenia    BMD 08-07-08 tspin - 1.2, left femur - .4, right femur +.2.  repeat 01-02-10  1.9    0.1   0.5    Rhinitis, chronic    Dr. Linward Foster...on immunotherapy since 5/08    Past Surgical History:  Procedure Laterality Date   BUNIONECTOMY  1982   bilateral   CHOLECYSTECTOMY N/A 07/17/2020   Procedure: LAPAROSCOPIC CHOLECYSTECTOMY;  Surgeon: Axel Filler, MD;  Location: [facility] OR;  Service: General;  Laterality: N/A;   FOOT SURGERY  1970   bilateral 5th toes, bones removed   GANGLION CYST EXCISION  2000   3rd finger right hand   TUBAL LIGATION  1971    No Known Allergies  Allergies as of 06/03/2023   No Known Allergies      Medication List        Accurate as of June 03, 2023  4:42 PM. If you have any questions, ask your nurse or doctor.          acetaminophen 325 MG tablet Commonly known as: TYLENOL Take 1-2 tablets (325-650 mg total) by mouth every 4 (four) hours as needed for mild pain.   Aspirin Low Dose 81 MG chewable tablet Generic drug: aspirin Chew 1 tablet (81 mg total) by mouth daily.   B-12 PO Take 1 tablet by mouth daily.   cetirizine 10 MG tablet Commonly known as: ZYRTEC Take 10 mg by mouth at bedtime as needed for  allergies (drippy nose, drainage, sneezing).   clopidogrel 75 MG tablet Commonly known as: PLAVIX Take 1 tablet (75 mg total) by mouth daily.   diclofenac Sodium 1 % Gel Commonly known as: VOLTAREN Apply 2-4 g topically 4 (four) times daily as needed (joint pain).   fluticasone 50 MCG/ACT nasal spray Commonly known as: FLONASE Place 1 spray into both nostrils daily as needed for allergies.   LUBRICATING EYE DROPS OP Place 1 drop into both eyes daily as needed (dry eyes).   methocarbamol 500 MG tablet Commonly known as: ROBAXIN Take 1 tablet (500 mg total) by mouth every 8 (eight) hours as needed for muscle spasms. Started by: Rajesh Wyss X Jeyden Coffelt   pantoprazole 40 MG tablet Commonly known as: PROTONIX TAKE 1 TABLET BY MOUTH EVERY DAY   polyethylene glycol 17 g packet Commonly known as: MIRALAX / GLYCOLAX Take 17 g by mouth as  needed.   rosuvastatin 20 MG tablet Commonly known as: CRESTOR Take 1 tablet (20 mg total) by mouth daily.   sertraline 25 MG tablet Commonly known as: ZOLOFT Take 0.5 tablets (12.5 mg total) by mouth daily.   Vitamin D3 1000 units Caps Take 1,000 Units by mouth daily.        Review of Systems:  Review of Systems  Constitutional:  Negative for appetite change, fatigue and fever.  HENT:  Negative for congestion and trouble swallowing.   Eyes:  Negative for visual disturbance.  Respiratory:  Negative for cough and wheezing.   Gastrointestinal:  Negative for abdominal pain and constipation.  Genitourinary:  Negative for dysuria and urgency.  Musculoskeletal:  Positive for arthralgias and gait problem.       R shoulder, R knee pain, ambulates with walker.   Neurological:  Positive for weakness. Negative for dizziness, speech difficulty and headaches.  Psychiatric/Behavioral:  Negative for confusion and sleep disturbance. The patient is not nervous/anxious.     Health Maintenance  Topic Date Due   DTaP/Tdap/Td (3 - Td or Tdap) 03/28/2023   INFLUENZA VACCINE  04/07/2023   COVID-19 Vaccine (7 - 2023-24 season) 05/08/2023   Medicare Annual Wellness (AWV)  08/21/2023   Pneumonia Vaccine 73+ Years old  Completed   DEXA SCAN  Completed   Zoster Vaccines- Shingrix  Completed   HPV VACCINES  Aged Out    Physical Exam: Vitals:   06/02/23 1552  BP: 122/78  Pulse: 76  Temp: (!) 97.2 F (36.2 C)  TempSrc: Temporal  SpO2: 99%  Weight: 136 lb 6.4 oz (61.9 kg)  Height: 5\' 2"  (1.575 m)   Body mass index is 24.95 kg/m. Physical Exam Vitals and nursing note reviewed.  Constitutional:      Appearance: Normal appearance.  HENT:     Head: Normocephalic and atraumatic.     Nose: Nose normal.     Mouth/Throat:     Mouth: Mucous membranes are moist.  Eyes:     Extraocular Movements: Extraocular movements intact.     Conjunctiva/sclera: Conjunctivae normal.     Pupils: Pupils  are equal, round, and reactive to light.  Cardiovascular:     Rate and Rhythm: Normal rate and regular rhythm.  Pulmonary:     Effort: Pulmonary effort is normal.     Breath sounds: No wheezing, rhonchi or rales.  Abdominal:     General: Bowel sounds are normal.     Palpations: Abdomen is soft.     Tenderness: There is no abdominal tenderness.  Musculoskeletal:     Cervical back:  Normal range of motion and neck supple.     Right lower leg: No edema.     Left lower leg: No edema.  Skin:    General: Skin is warm and dry.  Neurological:     General: No focal deficit present.     Mental Status: She is alert and oriented to person, place, and time. Mental status is at baseline.     Motor: Weakness present.     Coordination: Coordination abnormal.     Gait: Gait abnormal.     Comments: Right arm and leg weakness, muscle strength 5/5, limited overhead ROM of the right shoulder.   Psychiatric:        Mood and Affect: Mood normal.        Behavior: Behavior normal.        Thought Content: Thought content normal.     Labs reviewed: Basic Metabolic Panel: Recent Labs    03/18/23 1354 03/24/23 1240 03/29/23 0732 03/31/23 0820 04/04/23 0704  NA 141   < > 135 136 134*  K 4.1   < > 4.1 4.4 4.1  CL 106   < > 105 104 105  CO2 27   < > 21* 23 21*  GLUCOSE 80   < > 113* 149* 96  BUN 15   < > 19 20 20   CREATININE 0.74   < > 1.01* 1.06* 1.03*  CALCIUM 9.6   < > 9.0 9.6 9.4  TSH 1.43  --   --   --   --    < > = values in this interval not displayed.   Liver Function Tests: Recent Labs    03/18/23 1354 03/24/23 1240 03/29/23 0732  AST 19 25 18   ALT 17 30 21   ALKPHOS  --  55 49  BILITOT 0.8 0.8 0.7  PROT 6.4 7.3 6.3*  ALBUMIN  --  4.0 3.4*   No results for input(s): "LIPASE", "AMYLASE" in the last 8760 hours. No results for input(s): "AMMONIA" in the last 8760 hours. CBC: Recent Labs    03/18/23 1354 03/24/23 1240 03/24/23 1753 03/29/23 0732 04/04/23 0704  WBC 3.9 7.5  7.0 5.5 5.5  NEUTROABS 1,833 5.1  --  2.7  --   HGB 11.8 13.9 13.1 12.7 11.7*  HCT 35.2 41.1 39.1 38.4 35.1*  MCV 96.7 97.2 96.3 97.5 96.7  PLT 225 216 231 216 212   Lipid Panel: Recent Labs    08/20/22 1352 11/22/22 1334 03/25/23 0309  CHOL 280* 206* 225*  HDL 77 90 77  LDLCALC 185* 101* 131*  TRIG 74 61 86  CHOLHDL 3.6 2.3 2.9   Lab Results  Component Value Date   HGBA1C 6.0 (H) 03/24/2023    Procedures since last visit: No results found.  Assessment/Plan CVA (cerebral vascular accident) [facility]) c/o R hand spasm, may try Robaxin prn, update CMP/eGFR, Mg  CVA, working with therapy, followed by Neurology, on ASA, Plavix, Statin  Hyperlipidemia on Rosuvastatin, LDL 131 03/25/23  GERD (gastroesophageal reflux disease) Stable,  taking Pantoprazole, Hgb 11.7 04/04/23  Depression Stable,  taking Sertraline. TSH 1.43 03/18/23  Hyponatremia mild Na 134 04/04/23  CKD (chronic kidney disease) stage 3, GFR 30-59 ml/min (HCC) Bun/creat 20/1.03 04/04/23      Labs/tests ordered:  CMP/eGFR, Mg' Next appt:  07/15/2023

## 2023-06-04 LAB — MAGNESIUM: Magnesium: 2.3 mg/dL (ref 1.5–2.5)

## 2023-06-09 ENCOUNTER — Encounter: Payer: Self-pay | Admitting: Neurology

## 2023-06-09 ENCOUNTER — Ambulatory Visit: Payer: Medicare Other | Admitting: Neurology

## 2023-06-09 VITALS — BP 145/77 | HR 71 | Ht 62.0 in | Wt 136.5 lb

## 2023-06-09 DIAGNOSIS — I639 Cerebral infarction, unspecified: Secondary | ICD-10-CM | POA: Diagnosis not present

## 2023-06-09 MED ORDER — MIRTAZAPINE 15 MG PO TABS: 7.5000 mg | ORAL_TABLET | Freq: Every day | ORAL | 11 refills | Status: DC

## 2023-06-09 NOTE — Progress Notes (Signed)
Chief Complaint  Patient presents with   Hospitalization Follow-up    Rm15, son ([name]), NP/internal hospital referral for CVA: pt stated that she feels like she gaining her strength back but gets fatigued easier. She finished pt, still doing ot. Stopped zoloft but son thinks it should still be taking to help nerves. When she stopped taking she started having right hand tremor and mouth tremor. Has not resumed zoloft       [name] is a 89 y.o. female   Stroke involving left cortical, subcortical L frontal and parietal region,  Vascular risk factor of hyperlipidemia, aging,  On aspirin 81+ Plavix 75 mg daily, will stop Plavix after 3 months, aspirin alone,  Emphasized importance of moderate exercise, water intake,  Add on Remeron low-dose 15 mg half tablet every night for appetite, sleep  Follow-up with primary care only return to clinic for new issues  DIAGNOSTIC DATA (LABS, IMAGING, TESTING) - I reviewed patient records, labs, notes, testing and imaging myself where available.   MEDICAL HISTORY:  [name], is a 89 year old female, accompanied by her son [name], seen in request by her primary care PA   [name] to follow-up on stroke, initial evaluation June 09, 2023  History is obtained from the patient and review of electronic medical records. I personally reviewed pertinent available imaging films in PACS.   PMHx of  Stroke Chronic right knee pain. 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, she did suffer multiple joints pain, osteoarthritis,   On March 06, 2023, she noticed right hand numbness, followed by weakness, initially thought it was due to arthritis  By March 24, 2023, her hand was getting weaker, there was no significant worsening of her joint pain, she noticed gait abnormality, right arm and leg is  weaker, mild dysarthria  Was brought to hospital, MRI of the brain showed scattered patchy acute to early subacute ischemic infarction involving the cortical and subcortical of the left frontal parietal lobe,  MRI of cervical spine showed multilevel degenerative changes with mild to moderate stenosis C5-6 C6-7, with variable degree of foraminal narrowing CT angiogram of head and neck July 2024 showed evidence of intracranial atherosclerotic disease, high-grade narrowing at left vertebral origin, no significant carotid artery stenosis  Lipid panel, LDL 131 total cholesterol 086, normal TSH, A1c 6.0,  Echocardiogram 60 to 65%,  Seen by [name] at the hospital, recommend 30 days cardiac monitoring, was not on antiplatelet agent, aspirin 81+ Plavix 75 mg for 3 weeks then aspirin alone  30 days cardiac monitoring showed no atrial fibrillation, brief episode of asymptomatic NSVT and first-degree AV block,  Son reported that she thinks she lost her husband and recent illness, she has anxiety difficulty sleeping lack of appetite, was given Zoloft, could not tolerated   PHYSICAL EXAM:   Vitals:   06/09/23 1335 06/09/23 1340  BP: (!) 144/74 (!) 145/77  Pulse: 71 71  Weight: 136 lb 8 oz (61.9 kg)   Height: 5\' 2"  (1.575 m)    Not recorded     Body mass index is 24.97 kg/m.  PHYSICAL EXAMNIATION:  Gen: NAD, conversant, well nourised, well groomed                     Cardiovascular: Regular rate rhythm, no peripheral edema, warm, nontender. Eyes: Conjunctivae  clear without exudates or hemorrhage Neck: Supple, no carotid bruits. Pulmonary: Clear to auscultation bilaterally   NEUROLOGICAL EXAM:  MENTAL STATUS: Speech/cognition: Awake, alert, oriented to history taking and casual conversation CRANIAL NERVES: CN II: Visual fields are full to confrontation. Pupils are round equal and briskly reactive to light. CN III, IV, VI: extraocular movement are normal. No ptosis. CN V: Facial  sensation is intact to light touch CN VII: Face is symmetric with normal eye closure  CN VIII: Hearing is normal to causal conversation. CN IX, X: Phonation is normal. CN XI: Head turning and shoulder shrug are intact  MOTOR: Right wrist splint, right ankle brace, mild fixation of right arm on rapid rotating movement, slight right leg drift,  REFLEXES: Mild hyperreflexia on the right side  SENSORY: Intact to light touch, pinprick and vibratory sensation are intact in fingers and toes.  COORDINATION: There is no trunk or limb dysmetria noted.  GAIT/STANCE: Push-up from seated position, cautious, dragging right leg, holding right arm with elbow flexion,  REVIEW OF SYSTEMS:  Full 14 system review of systems performed and notable only for as above All other review of systems were negative.   ALLERGIES: No Known Allergies  HOME MEDICATIONS: Current Outpatient Medications  Medication Sig Dispense Refill   acetaminophen (TYLENOL) 325 MG tablet Take 1-2 tablets (325-650 mg total) by mouth every 4 (four) hours as needed for mild pain.     aspirin 81 MG chewable tablet Chew 1 tablet (81 mg total) by mouth daily. 90 tablet 0   Carboxymethylcellul-Glycerin (LUBRICATING EYE DROPS OP) Place 1 drop into both eyes daily as needed (dry eyes).     cetirizine (ZYRTEC) 10 MG tablet Take 10 mg by mouth at bedtime as needed for allergies (drippy nose, drainage, sneezing).     Cholecalciferol (VITAMIN D3) 25 MCG (1000 UT) capsule Take 1,000 Units by mouth daily.     clopidogrel (PLAVIX) 75 MG tablet Take 1 tablet (75 mg total) by mouth daily. 90 tablet 1   Cyanocobalamin (B-12 PO) Take 1 tablet by mouth daily.     diclofenac Sodium (VOLTAREN) 1 % GEL Apply 2-4 g topically 4 (four) times daily as needed (joint pain).     fluticasone (FLONASE) 50 MCG/ACT nasal spray Place 1 spray into both nostrils daily as needed for allergies.     methocarbamol (ROBAXIN) 500 MG tablet Take 1 tablet (500 mg total) by  mouth every 8 (eight) hours as needed for muscle spasms. 30 tablet 0   pantoprazole (PROTONIX) 40 MG tablet TAKE 1 TABLET BY MOUTH EVERY DAY 90 tablet 1   polyethylene glycol (MIRALAX / GLYCOLAX) 17 g packet Take 17 g by mouth as needed.     rosuvastatin (CRESTOR) 20 MG tablet Take 1 tablet (20 mg total) by mouth daily. 30 tablet 0   No current facility-administered medications for this visit.    PAST MEDICAL HISTORY: Past Medical History:  Diagnosis Date   Arthritis    Arthropathy, unspecified, site unspecified    Dr. Noel Gerold...right clavicular head swelling 2003   Colon polyp    colonoscopy 06-24-06 with no adenomatous change, hyperplasia  only   Environmental allergies    GERD (gastroesophageal reflux disease)    Healthcare maintenance    CPX 12-24-09, Td 1/05, pneumovax 2000 age 35, GYN [name] ([place])   Hyperlipidemia    target less than 160 > try off muscle aches 03-18-10   Morbid obesity (HCC)    ideal = 142.  target wt =  158 for BMI < 30   Osteopenia    BMD 08-07-08 tspin - 1.2, left femur - .4, right femur +.2.  repeat 01-02-10  1.9    0.1   0.5   Rhinitis, chronic    Dr. Linward Foster...on immunotherapy since 5/08    PAST SURGICAL HISTORY: Past Surgical History:  Procedure Laterality Date   BUNIONECTOMY  1982   bilateral   CHOLECYSTECTOMY N/A 07/17/2020   Procedure: LAPAROSCOPIC CHOLECYSTECTOMY;  Surgeon: Axel Filler, MD;  Location: [facility] OR;  Service: General;  Laterality: N/A;   FOOT SURGERY  1970   bilateral 5th toes, bones removed   GANGLION CYST EXCISION  2000   3rd finger right hand   TUBAL LIGATION  1971    FAMILY HISTORY: Family History  Problem Relation Age of Onset   Colon cancer Mother        (in computer for recall colonoscopy 10/12)   Healthy Daughter        x3   Healthy Son     SOCIAL HISTORY: Social History   Socioeconomic History   Marital status: Widowed    Spouse name: Not on file   Number of children: 4   Years of education: Not  on file   Highest education level: Some college, no degree  Occupational History   Not on file  Tobacco Use   Smoking status: Former    Current packs/day: 0.00    Average packs/day: 0.5 packs/day for 5.0 years (2.5 ttl pk-yrs)    Types: Cigarettes    Start date: 09/06/1950    Quit date: 09/07/1955    Years since quitting: 67.8   Smokeless tobacco: Never  Vaping Use   Vaping status: Never Used  Substance and Sexual Activity   Alcohol use: No    Alcohol/week: 0.0 standard drinks of alcohol   Drug use: No   Sexual activity: Not Currently  Other Topics Concern   Not on file  Social History Narrative   Exercise - 2 times weekly   Caffeine - 2 cups daily   Negative history of passive tobacco smoke exposure   Lives with husband of 59 years in one story home.   Has 4 children.   Retired from [employer].    Social Determinants of Health   Financial Resource Strain: Low Risk  (03/07/2023)   Overall Financial Resource Strain (CARDIA)    Difficulty of Paying Living Expenses: Not hard at all  Food Insecurity: No Food Insecurity (03/24/2023)   Hunger Vital Sign    Worried About Running Out of Food in the Last Year: Never true    Ran Out of Food in the Last Year: Never true  Transportation Needs: No Transportation Needs (03/24/2023)   PRAPARE - Administrator, Civil Service (Medical): No    Lack of Transportation (Non-Medical): No  Physical Activity: Unknown (03/07/2023)   Exercise Vital Sign    Days of Exercise per Week: 0 days    Minutes of Exercise per Session: Not on file  Stress: Stress Concern Present (03/07/2023)   [employer] of Occupational Health - Occupational Stress Questionnaire    Feeling of Stress : Very much  Social Connections: Moderately Integrated (03/07/2023)   Social Connection and Isolation Panel [NHANES]    Frequency of Communication with Friends and Family: More than three times a week    Frequency of Social Gatherings with Friends and Family: More  than three times a week    Attends Religious Services: More than 4  times per year    Active Member of Clubs or Organizations: Yes    Attends Banker Meetings: More than 4 times per year    Marital Status: Widowed  Intimate Partner Violence: Not At Risk (03/24/2023)   Humiliation, Afraid, Rape, and Kick questionnaire    Fear of Current or Ex-Partner: No    Emotionally Abused: No    Physically Abused: No    Sexually Abused: No      Levert Feinstein, M.D. Ph.D.  [facility] [address] [place], [place] [address] Ph: [phone] Fax: [phone]  CC:  Milinda Antis, PA-C [address] [place],  [place] [address]  Sharon Seller, NP

## 2023-06-10 ENCOUNTER — Encounter: Payer: Self-pay | Admitting: Nurse Practitioner

## 2023-06-10 ENCOUNTER — Other Ambulatory Visit: Payer: Self-pay | Admitting: Nurse Practitioner

## 2023-06-10 ENCOUNTER — Encounter: Payer: Self-pay | Admitting: Internal Medicine

## 2023-06-10 ENCOUNTER — Ambulatory Visit: Payer: Medicare Other | Attending: Internal Medicine | Admitting: Internal Medicine

## 2023-06-10 VITALS — BP 128/60 | HR 62 | Ht 62.0 in | Wt 135.2 lb

## 2023-06-10 DIAGNOSIS — I10 Essential (primary) hypertension: Secondary | ICD-10-CM | POA: Diagnosis not present

## 2023-06-10 DIAGNOSIS — I639 Cerebral infarction, unspecified: Secondary | ICD-10-CM | POA: Diagnosis not present

## 2023-06-10 NOTE — Progress Notes (Unsigned)
  Cardiology Office Note:  .   Date:  06/10/2023  ID:  MAZELLA DEEN, DOB 1931-02-17, MRN [id number] PCP: Sharon Seller, NP  [facility] Providers Cardiologist:  None    History of Present Illness: .   [name] is a 89 y.o. female.  Discussed the use of AI scribe software for clinical note transcription with the patient, who gave verbal consent to proceed.  History of Present Illness            ROS: negative except per HPI above.  Studies Reviewed: .        Results         Risk Assessment/Calculations:   {Does this patient have ATRIAL FIBRILLATION?:[phone]}         Physical Exam:   VS:  BP 128/60   Pulse 62   Ht 5\' 2"  (1.575 m)   Wt 135 lb 3.2 oz (61.3 kg)   SpO2 100%   BMI 24.73 kg/m    Wt Readings from Last 3 Encounters:  06/10/23 135 lb 3.2 oz (61.3 kg)  06/09/23 136 lb 8 oz (61.9 kg)  06/02/23 136 lb 6.4 oz (61.9 kg)     Physical Exam         GEN: Well nourished, well developed in no acute distress NECK: No JVD; No carotid bruits CARDIAC: ***RRR, no murmurs, rubs, gallops RESPIRATORY:  Clear to auscultation without rales, wheezing or rhonchi  ABDOMEN: Soft, non-tender, non-distended EXTREMITIES:  No edema; No deformity   ASSESSMENT AND PLAN: .    1. Cerebrovascular accident (CVA), unspecified mechanism (HCC)     Assessment and Plan                 {Are you ordering a CV Procedure (e.g. stress test, cath, DCCV, TEE, etc)?   Press F2        :[id number]}   Total time of encounter: *** minutes total time of encounter, including *** minutes spent in face-to-face patient care on the date of this encounter. This time includes coordination of care and counseling regarding above mentioned problem list. Remainder of non-face-to-face time involved reviewing chart documents/testing relevant to the patient encounter and documentation in the medical record. I have independently reviewed documentation from referring provider.    Weston Brass, MD, [facility]  [facility]

## 2023-06-10 NOTE — Patient Instructions (Signed)
Medication Instructions:  No Changes *If you need a refill on your cardiac medications before your next appointment, please call your pharmacy*   Lab Work: No Labs   Testing/Procedures: No Testing   Follow-Up: At [facility], you and your health needs are our priority.  As part of our continuing mission to provide you with exceptional heart care, we have created designated Provider Care Teams.  These Care Teams include your primary Cardiologist (physician) and Advanced Practice Providers (APPs -  Physician Assistants and Nurse Practitioners) who all work together to provide you with the care you need, when you need it.  We recommend signing up for the patient portal called "MyChart".  Sign up information is provided on this After Visit Summary.  MyChart is used to connect with patients for Virtual Visits (Telemedicine).  Patients are able to view lab/test results, encounter notes, upcoming appointments, etc.  Non-urgent messages can be sent to your provider as well.   To learn more about what you can do with MyChart, go to [email or web address].    Your next appointment:   1 year(s)  Provider:   Parke Poisson, MD

## 2023-06-13 ENCOUNTER — Other Ambulatory Visit: Payer: Self-pay

## 2023-06-13 MED ORDER — ROSUVASTATIN CALCIUM 20 MG PO TABS: 20.0000 mg | ORAL_TABLET | Freq: Every day | ORAL | 1 refills | Status: DC

## 2023-06-13 NOTE — Telephone Encounter (Signed)
Patient's daughter requested a refill on Crestor 20 mg but medication never filled by [facility].

## 2023-06-30 ENCOUNTER — Encounter: Payer: Self-pay | Admitting: Nurse Practitioner

## 2023-07-15 ENCOUNTER — Ambulatory Visit (INDEPENDENT_AMBULATORY_CARE_PROVIDER_SITE_OTHER): Payer: Medicare Other | Admitting: Nurse Practitioner

## 2023-07-15 ENCOUNTER — Encounter: Payer: Self-pay | Admitting: Nurse Practitioner

## 2023-07-15 VITALS — BP 124/80 | HR 71 | Temp 97.2°F | Ht 62.0 in | Wt 141.0 lb

## 2023-07-15 DIAGNOSIS — E7800 Pure hypercholesterolemia, unspecified: Secondary | ICD-10-CM

## 2023-07-15 DIAGNOSIS — R739 Hyperglycemia, unspecified: Secondary | ICD-10-CM

## 2023-07-15 DIAGNOSIS — K219 Gastro-esophageal reflux disease without esophagitis: Secondary | ICD-10-CM | POA: Diagnosis not present

## 2023-07-15 DIAGNOSIS — F32A Depression, unspecified: Secondary | ICD-10-CM

## 2023-07-15 DIAGNOSIS — I6930 Unspecified sequelae of cerebral infarction: Secondary | ICD-10-CM

## 2023-07-15 DIAGNOSIS — K5900 Constipation, unspecified: Secondary | ICD-10-CM

## 2023-07-15 DIAGNOSIS — N1831 Chronic kidney disease, stage 3a: Secondary | ICD-10-CM

## 2023-07-15 DIAGNOSIS — R634 Abnormal weight loss: Secondary | ICD-10-CM

## 2023-07-15 DIAGNOSIS — G4700 Insomnia, unspecified: Secondary | ICD-10-CM

## 2023-07-15 MED ORDER — MIRTAZAPINE 7.5 MG PO TABS: 7.5000 mg | ORAL_TABLET | Freq: Every day | ORAL | 1 refills | Status: DC

## 2023-07-15 NOTE — Progress Notes (Signed)
Careteam: Patient Care Team: Sharon Seller, NP as PCP - General (Geriatric Medicine) Parke Poisson, MD as PCP - Cardiology (Cardiology)  PLACE OF SERVICE:  [facility]  Advanced Directive information Does Patient Have a Medical Advance Directive?: Yes, Type of Advance Directive: Healthcare Power of [place];Out of facility DNR (pink MOST or yellow form), Pre-existing out of facility DNR order (yellow form or pink MOST form): Yellow form placed in chart (order not valid for inpatient use), Does patient want to make changes to medical advance directive?: No - Patient declined  No Known Allergies  Chief Complaint  Patient presents with   Medical Management of Chronic Issues    4 month follow-up and fasting labs. Discuss need for td/tdap. NCIR verified. Discuss plavix and when to d/c and begin asa. Discuss protonix, not taking.      HPI: Patient is a 89 y.o. female for follow up.   Ms croyle here with daughter.  She reports she is doing research and reading when she gets her prescriptions.  States she is only supposed to be on protonix for 3 months based on her readings.    Went to neurologist who put her on remeron- she took for a month but states she is doing fine and does not need to take any more however is taking but not consistently. re  Reports mood is doing fine.   Tremor is better- not taking robaxin.   Going to the bathroom frequently.  Not drinking enough water.   She is planning to get knee injection next week to help with her OA of her knee pain.   Some constipation- trying to manage with diet.   Can not taste food or smell well.  Review of Systems:  Review of Systems  Constitutional:  Negative for chills, fever and weight loss.  HENT:  Negative for tinnitus.   Respiratory:  Negative for cough, sputum production and shortness of breath.   Cardiovascular:  Negative for chest pain, palpitations and leg swelling.  Gastrointestinal:  Positive for  constipation. Negative for abdominal pain, diarrhea and heartburn.  Genitourinary:  Negative for dysuria, frequency and urgency.  Musculoskeletal:  Positive for joint pain. Negative for back pain, falls and myalgias.  Skin: Negative.   Neurological:  Negative for dizziness and headaches.  Psychiatric/Behavioral:  Negative for depression and memory loss. The patient does not have insomnia.     Past Medical History:  Diagnosis Date   Arthritis    Arthropathy, unspecified, site unspecified    Dr. Noel Gerold...right clavicular head swelling 2003   Colon polyp    colonoscopy 06-24-06 with no adenomatous change, hyperplasia  only   Environmental allergies    GERD (gastroesophageal reflux disease)    Healthcare maintenance    CPX 12-24-09, Td 1/05, pneumovax 2000 age 40, GYN [name] ([place])   Hyperlipidemia    target less than 160 > try off muscle aches 03-18-10   Morbid obesity (HCC)    ideal = 142.  target wt = 158 for BMI < 30   Osteopenia    BMD 08-07-08 tspin - 1.2, left femur - .4, right femur +.2.  repeat 01-02-10  1.9    0.1   0.5   Rhinitis, chronic    Dr. Linward Foster...on immunotherapy since 5/08   Past Surgical History:  Procedure Laterality Date   BUNIONECTOMY  1982   bilateral   CHOLECYSTECTOMY N/A 07/17/2020   Procedure: LAPAROSCOPIC CHOLECYSTECTOMY;  Surgeon: Axel Filler, MD;  Location: [facility] OR;  Service: General;  Laterality: N/A;   FOOT SURGERY  1970   bilateral 5th toes, bones removed   GANGLION CYST EXCISION  2000   3rd finger right hand   TUBAL LIGATION  1971   Social History:   reports that she quit smoking about 67 years ago. Her smoking use included cigarettes. She started smoking about 72 years ago. She has a 2.5 pack-year smoking history. She has never used smokeless tobacco. She reports that she does not drink alcohol and does not use drugs.  Family History  Problem Relation Age of Onset   Colon cancer Mother        (in computer for recall colonoscopy  10/12)   Healthy Daughter        x3   Healthy Son     Medications: Patient's Medications  New Prescriptions   No medications on file  Previous Medications   ACETAMINOPHEN (TYLENOL) 325 MG TABLET    Take 1-2 tablets (325-650 mg total) by mouth every 4 (four) hours as needed for mild pain.   CARBOXYMETHYLCELLUL-GLYCERIN (LUBRICATING EYE DROPS OP)    Place 1 drop into both eyes daily as needed (dry eyes).   CETIRIZINE (ZYRTEC) 10 MG TABLET    Take 10 mg by mouth at bedtime as needed for allergies (drippy nose, drainage, sneezing).   CHOLECALCIFEROL (VITAMIN D3) 25 MCG (1000 UT) CAPSULE    Take 1,000 Units by mouth daily.   CLOPIDOGREL (PLAVIX) 75 MG TABLET    Take 1 tablet (75 mg total) by mouth daily.   CYANOCOBALAMIN (B-12 PO)    Take 1 tablet by mouth daily.   DICLOFENAC SODIUM (VOLTAREN) 1 % GEL    Apply 2-4 g topically 4 (four) times daily as needed (joint pain).   FLUTICASONE (FLONASE) 50 MCG/ACT NASAL SPRAY    Place 1 spray into both nostrils daily as needed for allergies.   METHOCARBAMOL (ROBAXIN) 500 MG TABLET    Take 500 mg by mouth daily.   PANTOPRAZOLE (PROTONIX) 40 MG TABLET    TAKE 1 TABLET BY MOUTH EVERY DAY   ROSUVASTATIN (CRESTOR) 20 MG TABLET    Take 1 tablet (20 mg total) by mouth daily.  Modified Medications   No medications on file  Discontinued Medications   METHOCARBAMOL (ROBAXIN) 500 MG TABLET    Take 1 tablet (500 mg total) by mouth every 8 (eight) hours as needed for muscle spasms.   MIRTAZAPINE (REMERON) 15 MG TABLET    Take 0.5 tablets (7.5 mg total) by mouth at bedtime.    Physical Exam:  Vitals:   07/15/23 0931  BP: 124/80  Pulse: 71  Temp: (!) 97.2 F (36.2 C)  TempSrc: Temporal  SpO2: 95%  Weight: 141 lb (64 kg)  Height: 5\' 2"  (1.575 m)   Body mass index is 25.79 kg/m. Wt Readings from Last 3 Encounters:  07/15/23 141 lb (64 kg)  06/10/23 135 lb 3.2 oz (61.3 kg)  06/09/23 136 lb 8 oz (61.9 kg)    Physical Exam Constitutional:       General: She is not in acute distress.    Appearance: She is well-developed. She is not diaphoretic.  HENT:     Head: Normocephalic and atraumatic.     Mouth/Throat:     Pharynx: No oropharyngeal exudate.  Eyes:     Conjunctiva/sclera: Conjunctivae normal.     Pupils: Pupils are equal, round, and reactive to light.  Cardiovascular:     Rate and Rhythm: Normal rate and regular  rhythm.     Heart sounds: Normal heart sounds.  Pulmonary:     Effort: Pulmonary effort is normal.     Breath sounds: Normal breath sounds.  Abdominal:     General: Bowel sounds are normal.     Palpations: Abdomen is soft.  Musculoskeletal:     Cervical back: Normal range of motion and neck supple.     Right lower leg: No edema.     Left lower leg: No edema.  Skin:    General: Skin is warm and dry.  Neurological:     Mental Status: She is alert.  Psychiatric:        Mood and Affect: Mood normal.     Labs reviewed: Basic Metabolic Panel: Recent Labs    03/18/23 1354 03/24/23 1240 03/29/23 0732 03/31/23 0820 04/04/23 0704 06/03/23 1507  NA 141   < > 135 136 134*  --   K 4.1   < > 4.1 4.4 4.1  --   CL 106   < > 105 104 105  --   CO2 27   < > 21* 23 21*  --   GLUCOSE 80   < > 113* 149* 96  --   BUN 15   < > 19 20 20   --   CREATININE 0.74   < > 1.01* 1.06* 1.03*  --   CALCIUM 9.6   < > 9.0 9.6 9.4  --   MG  --   --   --   --   --  2.3  TSH 1.43  --   --   --   --   --    < > = values in this interval not displayed.   Liver Function Tests: Recent Labs    03/18/23 1354 03/24/23 1240 03/29/23 0732  AST 19 25 18   ALT 17 30 21   ALKPHOS  --  55 49  BILITOT 0.8 0.8 0.7  PROT 6.4 7.3 6.3*  ALBUMIN  --  4.0 3.4*   No results for input(s): "LIPASE", "AMYLASE" in the last 8760 hours. No results for input(s): "AMMONIA" in the last 8760 hours. CBC: Recent Labs    03/18/23 1354 03/24/23 1240 03/24/23 1753 03/29/23 0732 04/04/23 0704  WBC 3.9 7.5 7.0 5.5 5.5  NEUTROABS 1,833 5.1  --  2.7   --   HGB 11.8 13.9 13.1 12.7 11.7*  HCT 35.2 41.1 39.1 38.4 35.1*  MCV 96.7 97.2 96.3 97.5 96.7  PLT 225 216 231 216 212   Lipid Panel: Recent Labs    08/20/22 1352 11/22/22 1334 03/25/23 0309  CHOL 280* 206* 225*  HDL 77 90 77  LDLCALC 185* 101* 131*  TRIG 74 61 86  CHOLHDL 3.6 2.3 2.9   TSH: Recent Labs    03/18/23 1354  TSH 1.43   A1C: Lab Results  Component Value Date   HGBA1C 6.0 (H) 03/24/2023     Assessment/Plan 1. Late effect of cerebrovascular accident (CVA) -doing well at this time, she will complete current Rx of plavix and then continue ASA only. Continues on statin  - Lipid panel - COMPLETE METABOLIC PANEL WITH GFR - CBC with Differential/Platelet  2. Pure hypercholesterolemia -continues on crestor with dietary modifications - Lipid panel - COMPLETE METABOLIC PANEL WITH GFR - CBC with Differential/Platelet  3. Gastroesophageal reflux disease, unspecified whether esophagitis present No issues at this time. Has stopped protonix  4. Stage 3a chronic kidney disease (HCC) -Chronic and stable Encourage proper hydration Follow  metabolic panel Avoid nephrotoxic meds (NSAIDS)  5. Hyperglycemia -continue to monitor A1c with dietary modifications - Hemoglobin A1c  6. Depression, unspecified depression type Stable at this time, continues on remeron.  - mirtazapine (REMERON) 7.5 MG tablet; Take 1 tablet (7.5 mg total) by mouth at bedtime.  Dispense: 30 tablet; Refill: 1  7. Insomnia, unspecified type Improved on remeron, will continue - mirtazapine (REMERON) 7.5 MG tablet; Take 1 tablet (7.5 mg total) by mouth at bedtime.  Dispense: 30 tablet; Refill: 1  8. Weight loss Has had positive weight gain on remeron- will continue at this time - mirtazapine (REMERON) 7.5 MG tablet; Take 1 tablet (7.5 mg total) by mouth at bedtime.  Dispense: 30 tablet; Refill: 1  9. Constipation, unspecified constipation type -continues OTC and dietary modifications     Return in about 4 months (around 11/12/2023) for ..routine follow up  [name] K. Biagio Borg [facility] [phone]

## 2023-07-18 ENCOUNTER — Other Ambulatory Visit: Payer: Self-pay

## 2023-07-18 DIAGNOSIS — D509 Iron deficiency anemia, unspecified: Secondary | ICD-10-CM

## 2023-07-19 LAB — COMPLETE METABOLIC PANEL WITH GFR
AG Ratio: 1.7 (calc) (ref 1.0–2.5)
ALT: 16 U/L (ref 6–29)
AST: 18 U/L (ref 10–35)
Albumin: 4.2 g/dL (ref 3.6–5.1)
Alkaline phosphatase (APISO): 60 U/L (ref 37–153)
BUN: 24 mg/dL (ref 7–25)
CO2: 24 mmol/L (ref 20–32)
Calcium: 10 mg/dL (ref 8.6–10.4)
Chloride: 108 mmol/L (ref 98–110)
Creat: 0.8 mg/dL (ref 0.60–0.95)
Globulin: 2.5 g/dL (ref 1.9–3.7)
Glucose, Bld: 89 mg/dL (ref 65–99)
Potassium: 4.3 mmol/L (ref 3.5–5.3)
Sodium: 143 mmol/L (ref 135–146)
Total Bilirubin: 0.4 mg/dL (ref 0.2–1.2)
Total Protein: 6.7 g/dL (ref 6.1–8.1)
eGFR: 70 mL/min/{1.73_m2} (ref >=60)

## 2023-07-19 LAB — CBC WITH DIFFERENTIAL/PLATELET
Absolute Lymphocytes: 1436 {cells}/uL (ref 850–3900)
Absolute Monocytes: 366 {cells}/uL (ref 200–950)
Basophils Absolute: 39 {cells}/uL (ref 0–200)
Basophils Relative: 0.9 %
Eosinophils Absolute: 159 {cells}/uL (ref 15–500)
Eosinophils Relative: 3.7 %
HCT: 32.3 % — ABNORMAL LOW (ref 35.0–45.0)
Hemoglobin: 10.4 g/dL — ABNORMAL LOW (ref 11.7–15.5)
MCH: 32.2 pg (ref 27.0–33.0)
MCHC: 32.2 g/dL (ref 32.0–36.0)
MCV: 100 fL (ref 80.0–100.0)
MPV: 10.8 fL (ref 7.5–12.5)
Monocytes Relative: 8.5 %
Neutro Abs: 2301 {cells}/uL (ref 1500–7800)
Neutrophils Relative %: 53.5 %
Platelets: 259 10*3/uL (ref 140–400)
RBC: 3.23 10*6/uL — ABNORMAL LOW (ref 3.80–5.10)
RDW: 11.7 % (ref 11.0–15.0)
Total Lymphocyte: 33.4 %
WBC: 4.3 10*3/uL (ref 3.8–10.8)

## 2023-07-19 LAB — HEMOGLOBIN A1C
Hgb A1c MFr Bld: 5.6 %{Hb} (ref <5.7)
Mean Plasma Glucose: 114 mg/dL
eAG (mmol/L): 6.3 mmol/L

## 2023-07-19 LAB — TEST AUTHORIZATION

## 2023-07-19 LAB — LIPID PANEL
Cholesterol: 207 mg/dL — ABNORMAL HIGH (ref <200)
HDL: 89 mg/dL (ref >=50)
LDL Cholesterol (Calc): 97 mg/dL
Non-HDL Cholesterol (Calc): 118 mg/dL (ref <130)
Total CHOL/HDL Ratio: 2.3 (calc) (ref <5.0)
Triglycerides: 112 mg/dL (ref <150)

## 2023-07-19 LAB — IRON,TIBC AND FERRITIN PANEL
%SAT: 23 % (ref 16–45)
Ferritin: 17 ng/mL (ref 16–288)
Iron: 72 ug/dL (ref 45–160)
TIBC: 312 ug/dL (ref 250–450)

## 2023-07-25 ENCOUNTER — Ambulatory Visit (HOSPITAL_BASED_OUTPATIENT_CLINIC_OR_DEPARTMENT_OTHER)
Admission: RE | Admit: 2023-07-25 | Discharge: 2023-07-25 | Disposition: A | Discharge status: Home / Self Care | Payer: Medicare Other | Source: Ambulatory Visit | Attending: Physical Medicine & Rehabilitation | Admitting: Physical Medicine & Rehabilitation

## 2023-07-25 ENCOUNTER — Encounter: Payer: Medicare Other | Attending: Registered Nurse | Admitting: Physical Medicine & Rehabilitation

## 2023-07-25 ENCOUNTER — Encounter: Payer: Self-pay | Admitting: Physical Medicine & Rehabilitation

## 2023-07-25 VITALS — BP 134/80 | HR 67 | Ht 62.0 in | Wt 147.2 lb

## 2023-07-25 DIAGNOSIS — G8929 Other chronic pain: Secondary | ICD-10-CM | POA: Diagnosis present

## 2023-07-25 DIAGNOSIS — M1711 Unilateral primary osteoarthritis, right knee: Secondary | ICD-10-CM | POA: Insufficient documentation

## 2023-07-25 DIAGNOSIS — I63412 Cerebral infarction due to embolism of left middle cerebral artery: Secondary | ICD-10-CM | POA: Diagnosis not present

## 2023-07-25 DIAGNOSIS — M25571 Pain in right ankle and joints of right foot: Secondary | ICD-10-CM | POA: Diagnosis present

## 2023-07-25 DIAGNOSIS — I10 Essential (primary) hypertension: Secondary | ICD-10-CM | POA: Insufficient documentation

## 2023-07-25 DIAGNOSIS — F39 Unspecified mood [affective] disorder: Secondary | ICD-10-CM | POA: Insufficient documentation

## 2023-07-25 MED ORDER — LIDOCAINE HCL 1 % IJ SOLN
1.0000 mL | Freq: Once | INTRAMUSCULAR | Status: AC
  Administered 2023-07-25: 1 mL

## 2023-07-25 MED ORDER — TRIAMCINOLONE ACETONIDE 40 MG/ML IJ SUSP
40.0000 mg | Freq: Once | INTRAMUSCULAR | Status: AC
  Administered 2023-07-25: 40 mg via INTRA_ARTICULAR

## 2023-07-25 NOTE — Progress Notes (Addendum)
Subjective:    Patient ID: [name], female    DOB: Oct 23, 1930, 89 y.o.   MRN: [id number]  HPI   Hospital Discharge summary 04/05/23   Brief HPI:   [name] is a 89 y.o. female presented to [facility] health emergency department at [facility] on 03/24/2023 complaining of worsening right upper arm weakness. She had been reporting the symptoms for approximately 2 months per family report. She was recently treated for cervical spondylolisthesis and radiculopathy as well as dizziness. First son stated 11 hours prior to admission her right arm weakness worsened significantly. Stroke was activated and she was seen in consultation by Dr. Selina Cooley. She was out of the window for tenecteplase. Aspirin and Plavix started. CTA was not performed as part of her code stroke because exam was not consistent with LVO. Brain revealed scattered acute to subacute patchy ischemic infarctions evolving cortical and subcortical aspect of the left frontal and parietal lobes. No hemorrhage noted. MRI of the C-spine without acute abnormality. She underwent 2D echo with ejection fraction of approximately 60 to 65%. LDL 131 and hemoglobin A1c 6.0%. She was started on heparin subcutaneously for VTE prophylaxis. Neurology recommends a 30-day heart monitor on discharge. Her social history is significant for loss of her husband in January of this year after 60+ years of marriage and she is exhibiting significant depression. Zoloft started. Neurology plans to continue DAPT for 3 months./OT/SLP evaluations obtained. He is tolerating regular texture diet. Currently requiring min assist for bed mobility and transfers, using quite cane for ambulation.      Hospital Course: [name] was admitted to rehab 03/28/2023 for inpatient therapies to consist of PT, ST and OT at least three hours five days a week. Past admission physiatrist, therapy team and rehab RN have worked together to provide customized collaborative  inpatient rehab. Wrist cock-up splint applied to right. Patient requested to have Zoloft decreased to 12.5 mg as she was concerned this was causing dizziness. Zyrtec given for allergic rhinitis with improvement. BUN has normalized and serum creatinine is in the range of 1.01-1.06. Serum glucose range is 96-149.    Blood pressures were monitored on TID basis and found to have orthostatic hypotension. TED hose and abdominal binder placed. Encouraged PO fluids.   Rehab course: During patient's stay in rehab weekly team conferences were held to monitor patient's progress, set goals and discuss barriers to discharge. At admission, patient required min with mobility, min A with basic self-care skills.   She has had improvement in activity tolerance, balance, postural control as well as ability to compensate for deficits. She has had improvement in functional use RUE/LUE  and RLE/LLE as well as improvement in awareness. Patient has met 11 of 11 long term goals due to improved activity tolerance, improved balance, increased strength, ability to compensate for deficits, improved attention, improved awareness, and improved coordination.  Patient to discharge at an ambulatory level Supervision.   Patient's care partner is independent to provide the necessary physical assistance at discharge   Interval History 05/30/23 [name] is here for follow-up after CVA and completion of treatment at [facility].  She is here with her daughter and and they are both very pleasant.  [name] had been working with outpatient physical and Occupational Therapy.  She reports she is over all making progress with her function and is doing well overall since her stroke.  She does continue to have some pain in her right arm since the  stroke.  She wears a wrist brace on her right upper extremity.  She is working with occupational therapy and different techniques to help relieve this.  She is ambulating with a rollator.  She has stopped using  her Zoloft, patient feels that she is not depressed.  Patient's daughter thinks that her mood may continue to be decreased from her prior baseline.  Patient reports that her greatest pain currently is her right knee.  She has used IcyHot, Aspercreme, Biofreeze for her knee pain.  She has also had oral steroids for this issue.  She has previously declined steroid injections.  Interval History 07/25/23 [name] is here with her daughter for right knee injection.  She has previously been diagnosed with osteoarthritis of the right knee.  She has had oral prednisone but she has been hesitant to do a knee injection this in the past because she has been concerned that will be too painful.  Today she is agreeable to try a knee injection.  She continues to have right knee pain worsened with activity.  She has also been having some pain at her right ankle, also worsened with activity.  Both these areas have been a little bit more swollen the past few days.  She does have chronic dependent edema in her legs.  She reports her mood is doing okay.  She was seen by neurology and cardiology in early October.  Pain Inventory Average Pain 5 Pain Right Now 5 My pain is sharp, burning, and stabbing  In the last 24 hours, has pain interfered with the following? General activity 4 Relation with others 4 Enjoyment of life 1 What TIME of day is your pain at its worst? night Sleep (in general) Fair  Pain is worse with: bending, inactivity, standing, and some activites Pain improves with: heat/ice, therapy/exercise, medication, and injections Relief from Meds: 7  Family History  Problem Relation Age of Onset   Colon cancer Mother        (in computer for recall colonoscopy 10/12)   Healthy Daughter        x3   Healthy Son    Social History   Socioeconomic History   Marital status: Widowed    Spouse name: Not on file   Number of children: 4   Years of education: Not on file   Highest education level:  Associate degree: occupational, Scientist, product/process development, or vocational program  Occupational History   Not on file  Tobacco Use   Smoking status: Former    Current packs/day: 0.00    Average packs/day: 0.5 packs/day for 5.0 years (2.5 ttl pk-yrs)    Types: Cigarettes    Start date: 09/06/1950    Quit date: 09/07/1955    Years since quitting: 67.9   Smokeless tobacco: Never  Vaping Use   Vaping status: Never Used  Substance and Sexual Activity   Alcohol use: No    Alcohol/week: 0.0 standard drinks of alcohol   Drug use: No   Sexual activity: Not Currently  Other Topics Concern   Not on file  Social History Narrative   Exercise - 2 times weekly   Caffeine - 2 cups daily   Negative history of passive tobacco smoke exposure   Lives with husband of 59 years in one story home.   Has 4 children.   Retired from [employer].    Social Determinants of Health   Financial Resource Strain: Low Risk  (07/11/2023)   Overall Financial Resource Strain (CARDIA)  Difficulty of Paying Living Expenses: Not very hard  Food Insecurity: No Food Insecurity (07/11/2023)   Hunger Vital Sign    Worried About Running Out of Food in the Last Year: Never true    Ran Out of Food in the Last Year: Never true  Transportation Needs: No Transportation Needs (07/11/2023)   PRAPARE - Administrator, Civil Service (Medical): No    Lack of Transportation (Non-Medical): No  Physical Activity: Unknown (07/11/2023)   Exercise Vital Sign    Days of Exercise per Week: 0 days    Minutes of Exercise per Session: Not on file  Stress: Stress Concern Present (07/11/2023)   [employer] of Occupational Health - Occupational Stress Questionnaire    Feeling of Stress : To some extent  Social Connections: Moderately Integrated (07/11/2023)   Social Connection and Isolation Panel [NHANES]    Frequency of Communication with Friends and Family: Twice a week    Frequency of Social Gatherings with Friends and Family: Three  times a week    Attends Religious Services: 1 to 4 times per year    Active Member of Clubs or Organizations: No    Attends Engineer, structural: More than 4 times per year    Marital Status: Widowed   Past Surgical History:  Procedure Laterality Date   BUNIONECTOMY  1982   bilateral   CHOLECYSTECTOMY N/A 07/17/2020   Procedure: LAPAROSCOPIC CHOLECYSTECTOMY;  Surgeon: Axel Filler, MD;  Location: [facility] OR;  Service: General;  Laterality: N/A;   FOOT SURGERY  1970   bilateral 5th toes, bones removed   GANGLION CYST EXCISION  2000   3rd finger right hand   TUBAL LIGATION  1971   Past Surgical History:  Procedure Laterality Date   BUNIONECTOMY  1982   bilateral   CHOLECYSTECTOMY N/A 07/17/2020   Procedure: LAPAROSCOPIC CHOLECYSTECTOMY;  Surgeon: Axel Filler, MD;  Location: [facility] OR;  Service: General;  Laterality: N/A;   FOOT SURGERY  1970   bilateral 5th toes, bones removed   GANGLION CYST EXCISION  2000   3rd finger right hand   TUBAL LIGATION  1971   Past Medical History:  Diagnosis Date   Arthritis    Arthropathy, unspecified, site unspecified    Dr. Noel Gerold...right clavicular head swelling 2003   Colon polyp    colonoscopy 06-24-06 with no adenomatous change, hyperplasia  only   Environmental allergies    GERD (gastroesophageal reflux disease)    Healthcare maintenance    CPX 12-24-09, Td 1/05, pneumovax 2000 age 6, GYN [name] ([place])   Hyperlipidemia    target less than 160 > try off muscle aches 03-18-10   Morbid obesity (HCC)    ideal = 142.  target wt = 158 for BMI < 30   Osteopenia    BMD 08-07-08 tspin - 1.2, left femur - .4, right femur +.2.  repeat 01-02-10  1.9    0.1   0.5   Rhinitis, chronic    Dr. Linward Foster...on immunotherapy since 5/08   BP 134/80   Pulse 67   Ht 5\' 2"  (1.575 m)   Wt 147 lb 3.2 oz (66.8 kg)   SpO2 97%   BMI 26.92 kg/m   Opioid Risk Score:   Fall Risk Score:  `1  Depression screen [facility] 2/9     07/25/2023    10:42 AM 04/18/2023   11:30 AM 12/06/2022    8:40 AM 08/20/2022    1:09 PM  Depression screen PHQ  2/9  Decreased Interest 0 0 0 0  Down, Depressed, Hopeless 0 0 0 0  PHQ - 2 Score 0 0 0 0  Altered sleeping  0    Tired, decreased energy  1    Change in appetite  1    Feeling bad or failure about yourself   0    Trouble concentrating  0    Moving slowly or fidgety/restless  1    Suicidal thoughts  0    PHQ-9 Score  3    Difficult doing work/chores  Somewhat difficult        Review of Systems  Musculoskeletal:        Right shoulder pain Right knee pain  All other systems reviewed and are negative.      Objective:   Physical Exam    07/25/2023   10:50 AM 07/25/2023   10:41 AM 07/15/2023    9:31 AM  Vitals with BMI  Height  5\' 2"  5\' 2"   Weight  147 lbs 3 oz 141 lbs  BMI  26.92 25.78  Systolic 134 145 782  Diastolic 80 80 80  Pulse  67 71    Gen: no distress, normal appearing HEENT: oral mucosa pink and moist, NCAT Chest: normal effort, normal rate of breathing Abd: soft, non-distended Ext: B/L LE edema R > L Psych: pleasant, normal affect, very pleasant  Skin: intact Neuro: Alert and oriented, normal speech and language.  Fair insight and awareness.  No dysarthria or aphasia noted. Strength 5 out of 5 left upper extremity Strength 4 out of 5 right upper extremity Strength bilateral lower extremities 4+ out of 5 Sensation intact to light touch in all 4 extremities. Musculoskeletal:  Walks with a cane Right knee tender to palpation anteriorly, mild swelling noted R ankle swelling and TTP lateral ankle Arthritic changes noted in bilateral hands     Assessment & Plan:    1) Left frontal and parietal lobe infarcts small vessel disease vs cardio-embolic due to A-fib.   -Continue follow-up with neurology. She is currently on ASA.  She was not found to have Afib by cardiac monitor.   2)HTN  -Well-controlled continue to follow-up with PCP  3) Right knee pain  likely due to OA/right upper extremity pain.   -Continue Voltaren gel  -She also has R ankle pain, order R knee and R ankle xray studies    R knee injection   Indication:Knee pain not relieved by medication management and other conservative care.  Informed consent was obtained after describing risks and benefits of the procedure with the patient, this includes bleeding, bruising, infection and medication side effects. The patient wishes to proceed and has given written consent. The patient was placed in a recumbent position. The Lateral aspect of the knee was marked and prepped with Betadine and alcohol. It was then entered with a 25-gauge 1-1/2 inch needle.  After negative draw back for blood, a solution containing one ML of 40mg  per mL kenalog and 1 mL of 1% lidocaine were injected. The patient tolerated the procedure well. Post procedure instructions were given.   4) Mood disorder, denies SI or HI  -She was started on Remeron at bedtime by neurology.  Patient denies any depression however daughter feels like her mood is not at baseline  5) Lower extremity edema.  -Discussed use of compression stockings, leg elevation above the heart when in bed, salt moderation   Reviewed  R knee xray and ankle xray reviewed by phone  08/24/23. Repeat xray ankle ordered. She reports pain and swelling in this area is improved. She will repeat xray soon.   Xray ankle 07/25/23 Lateral soft tissue swelling with suggestion of nondisplaced fracture at the lateral malleolus.

## 2023-07-27 ENCOUNTER — Other Ambulatory Visit: Payer: Medicare Other

## 2023-07-27 DIAGNOSIS — D509 Iron deficiency anemia, unspecified: Secondary | ICD-10-CM

## 2023-08-06 ENCOUNTER — Other Ambulatory Visit: Payer: Self-pay | Admitting: Nurse Practitioner

## 2023-08-06 DIAGNOSIS — R634 Abnormal weight loss: Secondary | ICD-10-CM

## 2023-08-06 DIAGNOSIS — G4700 Insomnia, unspecified: Secondary | ICD-10-CM

## 2023-08-06 DIAGNOSIS — F32A Depression, unspecified: Secondary | ICD-10-CM

## 2023-08-16 ENCOUNTER — Telehealth: Payer: Self-pay

## 2023-08-16 MED ORDER — POLYETHYLENE GLYCOL 3350 17 GM/SCOOP PO POWD: 17.0000 g | Freq: Every day | ORAL | Status: AC

## 2023-08-16 NOTE — Telephone Encounter (Signed)
Patient aware and verbalized understanding. Patient would like additional recommendations

## 2023-08-16 NOTE — Telephone Encounter (Signed)
Patient called to express the barriers that she has faced with trying to complete the 3 requested "stool cards."  Patient states she does not have BM's daily and generally struggles with constipation.   Patient has tried benefiber, prune juice along with other homeopathic methods to move bowels and although she has had bowel movements they are firm and nothing significant comes off on the tissue paper to complete test.   I explained that patient could collect BM in a disposable container and brush the sample from the container to smear onto the card. Patient would like to know what Sharon Seller, NP thoughts are.  Patient plans to reschedule lab appointment to recheck hemoglobin according to when she can complete the stool samples.

## 2023-08-16 NOTE — Telephone Encounter (Signed)
Recommend adding miralax 17 gm daily to regimen to help with constipation

## 2023-08-16 NOTE — Telephone Encounter (Signed)
Patient is aware of providers response and verbalized understanding  

## 2023-08-16 NOTE — Telephone Encounter (Signed)
Yes would recommend getting a medical hat container to catch the stool  Also let us know if she needs recommendations to help with her constipation

## 2023-08-22 ENCOUNTER — Encounter: Payer: Self-pay | Admitting: Nurse Practitioner

## 2023-08-22 ENCOUNTER — Ambulatory Visit (INDEPENDENT_AMBULATORY_CARE_PROVIDER_SITE_OTHER): Payer: Medicare Other | Admitting: Nurse Practitioner

## 2023-08-22 VITALS — BP 136/78 | HR 70 | Temp 96.4°F | Resp 20 | Ht 62.0 in | Wt 142.2 lb

## 2023-08-22 DIAGNOSIS — Z0000 Encounter for general adult medical examination without abnormal findings: Secondary | ICD-10-CM | POA: Diagnosis not present

## 2023-08-22 LAB — CBC WITH DIFFERENTIAL/PLATELET
Absolute Lymphocytes: 1411 {cells}/uL (ref 850–3900)
Absolute Monocytes: 277 {cells}/uL (ref 200–950)
Basophils Absolute: 38 {cells}/uL (ref 0–200)
Basophils Relative: 0.9 %
Eosinophils Absolute: 118 {cells}/uL (ref 15–500)
Eosinophils Relative: 2.8 %
HCT: 36.3 % (ref 35.0–45.0)
Hemoglobin: 11.5 g/dL — ABNORMAL LOW (ref 11.7–15.5)
MCH: 30.6 pg (ref 27.0–33.0)
MCHC: 31.7 g/dL — ABNORMAL LOW (ref 32.0–36.0)
MCV: 96.5 fL (ref 80.0–100.0)
MPV: 11.2 fL (ref 7.5–12.5)
Monocytes Relative: 6.6 %
Neutro Abs: 2356 {cells}/uL (ref 1500–7800)
Neutrophils Relative %: 56.1 %
Platelets: 236 10*3/uL (ref 140–400)
RBC: 3.76 10*6/uL — ABNORMAL LOW (ref 3.80–5.10)
RDW: 11.9 % (ref 11.0–15.0)
Total Lymphocyte: 33.6 %
WBC: 4.2 10*3/uL (ref 3.8–10.8)

## 2023-08-22 NOTE — Patient Instructions (Signed)
  Ms. Destefanis , Thank you for taking time to come for your Medicare Wellness Visit. I appreciate your ongoing commitment to your health goals. Please review the following plan we discussed and let me know if I can assist you in the future.   This is a list of the screening recommended for you and due dates:  Health Maintenance  Topic Date Due   COVID-19 Vaccine (7 - 2024-25 season) 05/08/2023   Medicare Annual Wellness Visit  08/21/2024   DTaP/Tdap/Td vaccine (4 - Td or Tdap) 07/14/2033   Pneumonia Vaccine  Completed   Flu Shot  Completed   DEXA scan (bone density measurement)  Completed   Zoster (Shingles) Vaccine  Completed   HPV Vaccine  Aged Out

## 2023-08-22 NOTE — Progress Notes (Signed)
Subjective:   [name] is a 89 y.o. female who presents for Medicare Annual (Subsequent) preventive examination.  Visit Complete: In person at [facility]  Cardiac Risk Factors include: advanced age (>68men, >65 women);sedentary lifestyle;hypertension;dyslipidemia     Objective:    Today's Vitals   08/22/23 1055  BP: 136/78  Pulse: 70  Resp: 20  Temp: (!) 96.4 F (35.8 C)  SpO2: 98%  Weight: 142 lb 3.2 oz (64.5 kg)  Height: 5\' 2"  (1.575 m)   Body mass index is 26.01 kg/m.     08/22/2023   11:01 AM 07/15/2023    9:32 AM 06/02/2023    3:53 PM 04/07/2023    4:05 PM 03/28/2023    3:20 PM 03/24/2023    5:00 PM 03/18/2023   10:55 AM  Advanced Directives  Does Patient Have a Medical Advance Directive? Yes Yes Yes Yes Yes Yes Yes  Type of Estate agent of [place];Living will;Out of facility DNR (pink MOST or yellow form) Healthcare Power of [place];Out of facility DNR (pink MOST or yellow form) Healthcare Power of [place];Out of facility DNR (pink MOST or yellow form) Healthcare Power of [place];Out of facility DNR (pink MOST or yellow form) Healthcare Power of [employer] of [place];Living will Healthcare Power of [place];Out of facility DNR (pink MOST or yellow form)  Does patient want to make changes to medical advance directive? No - Patient declined No - Patient declined No - Patient declined No - Patient declined No - Patient declined No - Patient declined No - Patient declined  Copy of Healthcare Power of Attorney in Chart? Yes - validated most recent copy scanned in chart (See row information) Yes - validated most recent copy scanned in chart (See row information) Yes - validated most recent copy scanned in chart (See row information) Yes - validated most recent copy scanned in chart (See row information) No - copy requested  Yes - validated most recent copy scanned in chart (See row information)  Pre-existing out of facility DNR order  (yellow form or pink MOST form) Yellow form placed in chart (order not valid for inpatient use) Yellow form placed in chart (order not valid for inpatient use) Yellow form placed in chart (order not valid for inpatient use) Yellow form placed in chart (order not valid for inpatient use)   Yellow form placed in chart (order not valid for inpatient use)    Current Medications (verified) Outpatient Encounter Medications as of 08/22/2023  Medication Sig   acetaminophen (TYLENOL) 325 MG tablet Take 1-2 tablets (325-650 mg total) by mouth every 4 (four) hours as needed for mild pain.   aspirin EC 81 MG tablet Take 81 mg by mouth daily. Swallow whole.   Carboxymethylcellul-Glycerin (LUBRICATING EYE DROPS OP) Place 1 drop into both eyes daily as needed (dry eyes).   cetirizine (ZYRTEC) 10 MG tablet Take 10 mg by mouth at bedtime as needed for allergies (drippy nose, drainage, sneezing).   Cholecalciferol (VITAMIN D3) 25 MCG (1000 UT) capsule Take 1,000 Units by mouth daily.   Cyanocobalamin (B-12 PO) Take 1 tablet by mouth daily.   diclofenac Sodium (VOLTAREN) 1 % GEL Apply 2-4 g topically 4 (four) times daily as needed (joint pain).   fluticasone (FLONASE) 50 MCG/ACT nasal spray Place 1 spray into both nostrils daily as needed for allergies.   mirtazapine (REMERON) 7.5 MG tablet TAKE 1 TABLET BY MOUTH AT BEDTIME.   polyethylene glycol powder (GLYCOLAX/MIRALAX) 17 GM/SCOOP powder Take 17 g  by mouth daily.   rosuvastatin (CRESTOR) 20 MG tablet Take 1 tablet (20 mg total) by mouth daily.   No facility-administered encounter medications on file as of 08/22/2023.    Allergies (verified) Patient has no known allergies.   History: Past Medical History:  Diagnosis Date   Arthritis    Arthropathy, unspecified, site unspecified    Dr. Noel Gerold...right clavicular head swelling 2003   Colon polyp    colonoscopy 06-24-06 with no adenomatous change, hyperplasia  only   Environmental allergies    GERD  (gastroesophageal reflux disease)    Healthcare maintenance    CPX 12-24-09, Td 1/05, pneumovax 2000 age 19, [name] ([place])   Hyperlipidemia    target less than 160 > try off muscle aches 03-18-10   Morbid obesity (HCC)    ideal = 142.  target wt = 158 for BMI < 30   Osteopenia    BMD 08-07-08 tspin - 1.2, left femur - .4, right femur +.2.  repeat 01-02-10  1.9    0.1   0.5   Rhinitis, chronic    Dr. Linward Foster...on immunotherapy since 5/08   Past Surgical History:  Procedure Laterality Date   BUNIONECTOMY  1982   bilateral   CHOLECYSTECTOMY N/A 07/17/2020   Procedure: LAPAROSCOPIC CHOLECYSTECTOMY;  Surgeon: Axel Filler, MD;  Location: [facility] OR;  Service: General;  Laterality: N/A;   FOOT SURGERY  1970   bilateral 5th toes, bones removed   GANGLION CYST EXCISION  2000   3rd finger right hand   TUBAL LIGATION  1971   Family History  Problem Relation Age of Onset   Colon cancer Mother        (in computer for recall colonoscopy 10/12)   Healthy Daughter        x3   Healthy Son    Social History   Socioeconomic History   Marital status: Widowed    Spouse name: Not on file   Number of children: 4   Years of education: Not on file   Highest education level: Associate degree: occupational, Scientist, product/process development, or vocational program  Occupational History   Not on file  Tobacco Use   Smoking status: Former    Current packs/day: 0.00    Average packs/day: 0.5 packs/day for 5.0 years (2.5 ttl pk-yrs)    Types: Cigarettes    Start date: 09/06/1950    Quit date: 09/07/1955    Years since quitting: 68.0   Smokeless tobacco: Never  Vaping Use   Vaping status: Never Used  Substance and Sexual Activity   Alcohol use: No    Alcohol/week: 0.0 standard drinks of alcohol   Drug use: No   Sexual activity: Not Currently  Other Topics Concern   Not on file  Social History Narrative   Exercise - 2 times weekly   Caffeine - 2 cups daily   Negative history of passive tobacco smoke  exposure   Lives with husband of 59 years in one story home.   Has 4 children.   Retired from [employer].    Social Drivers of Corporate investment banker Strain: Low Risk  (07/11/2023)   Overall Financial Resource Strain (CARDIA)    Difficulty of Paying Living Expenses: Not very hard  Food Insecurity: No Food Insecurity (07/11/2023)   Hunger Vital Sign    Worried About Running Out of Food in the Last Year: Never true    Ran Out of Food in the Last Year: Never true  Transportation Needs:  No Transportation Needs (07/11/2023)   PRAPARE - Administrator, Civil Service (Medical): No    Lack of Transportation (Non-Medical): No  Physical Activity: Unknown (07/11/2023)   Exercise Vital Sign    Days of Exercise per Week: 0 days    Minutes of Exercise per Session: Not on file  Stress: Stress Concern Present (07/11/2023)   [employer] of Occupational Health - Occupational Stress Questionnaire    Feeling of Stress : To some extent  Social Connections: Moderately Integrated (07/11/2023)   Social Connection and Isolation Panel [NHANES]    Frequency of Communication with Friends and Family: Twice a week    Frequency of Social Gatherings with Friends and Family: Three times a week    Attends Religious Services: 1 to 4 times per year    Active Member of Clubs or Organizations: No    Attends Engineer, structural: More than 4 times per year    Marital Status: Widowed    Tobacco Counseling Counseling given: Not Answered   Clinical Intake:  Pre-visit preparation completed: Yes  Pain : No/denies pain     BMI - recorded: 26.01 Nutritional Status: BMI 25 -29 Overweight Nutritional Risks: Other (Comment) Diabetes: No  How often do you need to have someone help you when you read instructions, pamphlets, or other written materials from your doctor or pharmacy?: 1 - Never What is the last grade level you completed in school?: 2 years college  Interpreter Needed?:  No  Information entered by :: Porsha McClurkin,CMA   Activities of Daily Living    08/22/2023   10:58 AM 03/28/2023    3:20 PM  In your present state of health, do you have any difficulty performing the following activities:  Hearing? 0 0  Vision? 0 0  Difficulty concentrating or making decisions? 0 0  Walking or climbing stairs? 0 1  Dressing or bathing? 0 1  Doing errands, shopping? 0 0  Preparing Food and eating ? N   Using the Toilet? N   In the past six months, have you accidently leaked urine? N   Do you have problems with loss of bowel control? N   Managing your Medications? Y   Managing your Finances? Y   Housekeeping or managing your Housekeeping? Y     Patient Care Team: Sharon Seller, NP as PCP - General (Geriatric Medicine) Parke Poisson, MD as PCP - Cardiology (Cardiology)  Indicate any recent Medical Services you may have received from other than [facility] providers in the past year (date may be approximate).     Assessment:   This is a routine wellness examination for Marca.  Hearing/Vision screen Hearing Screening - Comments:: No hearing concerns Vision Screening - Comments:: No vision concerns   Goals Addressed   None    Depression Screen    08/22/2023   11:01 AM 07/25/2023   10:42 AM 04/18/2023   11:30 AM 12/06/2022    8:40 AM 08/20/2022    1:09 PM  PHQ 2/9 Scores  PHQ - 2 Score 0 0 0 0 0  PHQ- 9 Score   3      Fall Risk    08/22/2023   11:01 AM 07/25/2023   10:42 AM 07/15/2023   10:57 AM 05/30/2023   10:33 AM 04/18/2023   11:29 AM  Fall Risk   Falls in the past year? 0 0 0 0 0  Number falls in past yr: 0 0 0  0  Injury with  Fall? 0  0    Risk for fall due to : No Fall Risks  No Fall Risks    Follow up Falls evaluation completed  Falls evaluation completed      MEDICARE RISK AT HOME: Medicare Risk at Home Any stairs in or around the home?: Yes If so, are there any without handrails?: No Home free of loose throw rugs in  walkways, pet beds, electrical cords, etc?: Yes Adequate lighting in your home to reduce risk of falls?: Yes Life alert?: No Use of a cane, walker or w/c?: Yes Grab bars in the bathroom?: Yes Shower chair or bench in shower?: Yes Elevated toilet seat or a handicapped toilet?: Yes  TIMED UP AND GO:  Was the test performed?  No    Cognitive Function:    08/22/2023   11:02 AM 08/20/2022    1:10 PM  MMSE - Mini Mental State Exam  Orientation to time 5 5  Orientation to Place 5 5  Registration 3 3  Attention/ Calculation 5 5  Recall 3 2  Language- name 2 objects 2 2  Language- repeat 1 1  Language- follow 3 step command 3 3  Language- read & follow direction 1 1  Write a sentence 1 1  Copy design 1 0  Total score 30 28        Immunizations Immunization History  Administered Date(s) Administered   Fluad Quad(high Dose 65+) 05/01/2019, 05/05/2020, 07/07/2021   Influenza Split 06/23/2011, 06/07/2012   Influenza Whole 05/31/2008, 06/20/2009, 05/21/2010   Influenza, High Dose Seasonal PF 06/10/2016, 06/14/2017, 07/27/2018, 06/11/2022, 07/02/2023   Influenza,inj,Quad PF,6+ Mos 05/31/2013, 06/11/2014, 05/09/2015   Influenza-Unspecified 05/05/2020   Moderna Covid-19 Fall Seasonal Vaccine 35yrs & older 06/11/2022   PFIZER(Purple Top)SARS-COV-2 Vaccination 10/13/2019, 11/03/2019, 06/24/2020, 01/17/2021   PNEUMOCOCCAL CONJUGATE-20 08/11/2021   Pfizer Covid-19 Vaccine Bivalent Booster 45yrs & up 05/21/2021   Pneumococcal Conjugate-13 02/27/2015   Pneumococcal Polysaccharide-23 08/07/1999   Td 09/07/2003   Tdap 03/27/2013, 07/15/2023   Zoster Recombinant(Shingrix) 05/08/2019, 08/14/2019    TDAP status: Up to date  Flu Vaccine status: Up to date  Pneumococcal vaccine status: Up to date  Covid-19 vaccine status: Information provided on how to obtain vaccines.   Qualifies for Shingles Vaccine? No   Zostavax completed Yes   Shingrix Completed?: Yes  Screening Tests Health  Maintenance  Topic Date Due   COVID-19 Vaccine (7 - 2024-25 season) 05/08/2023   Medicare Annual Wellness (AWV)  08/21/2024   DTaP/Tdap/Td (4 - Td or Tdap) 07/14/2033   Pneumonia Vaccine 107+ Years old  Completed   INFLUENZA VACCINE  Completed   DEXA SCAN  Completed   Zoster Vaccines- Shingrix  Completed   HPV VACCINES  Aged Out    Health Maintenance  Health Maintenance Due  Topic Date Due   COVID-19 Vaccine (7 - 2024-25 season) 05/08/2023    Colorectal cancer screening: No longer required.   Mammogram status: No longer required due to age.  Bone Density status: Completed 07/28/2021. Results reflect: Bone density results: NORMAL. Repeat every 5 years.  Lung Cancer Screening: (Low Dose CT Chest recommended if Age 59-80 years, 20 pack-year currently smoking OR have quit w/in 15years.) does not qualify.   Lung Cancer Screening Referral: na Additional Screening:  Hepatitis C Screening: does not qualify; Completed na  Vision Screening: Recommended annual ophthalmology exams for early detection of glaucoma and other disorders of the eye. Is the patient up to date with their annual eye exam?  Yes  Who is the provider or what is the name of the office in which the patient attends annual eye exams? My EYE doctor, Dr Lillia Abed If pt is not established with a provider, would they like to be referred to a provider to establish care? No .   Dental Screening: Recommended annual dental exams for proper oral hygiene   Community Resource Referral / Chronic Care Management: CRR required this visit?  No   CCM required this visit?  No     Plan:     I have personally reviewed and noted the following in the patient's chart:   Medical and social history Use of alcohol, tobacco or illicit drugs  Current medications and supplements including opioid prescriptions. Patient is not currently taking opioid prescriptions. Functional ability and status Nutritional status Physical  activity Advanced directives List of other physicians Hospitalizations, surgeries, and ER visits in previous 12 months Vitals Screenings to include cognitive, depression, and falls Referrals and appointments  In addition, I have reviewed and discussed with patient certain preventive protocols, quality metrics, and best practice recommendations. A written personalized care plan for preventive services as well as general preventive health recommendations were provided to patient.     Sharon Seller, NP   08/22/2023   After Visit Summary: (In Person-Printed) AVS printed and given to the patient

## 2023-08-24 NOTE — Addendum Note (Signed)
Addended by: Fanny Dance on: 08/24/2023 02:25 PM   Modules accepted: Orders

## 2023-08-26 ENCOUNTER — Ambulatory Visit (HOSPITAL_BASED_OUTPATIENT_CLINIC_OR_DEPARTMENT_OTHER)
Admission: RE | Admit: 2023-08-26 | Discharge: 2023-08-26 | Disposition: A | Discharge status: Home / Self Care | Payer: Medicare Other | Source: Ambulatory Visit | Attending: Physical Medicine & Rehabilitation | Admitting: Physical Medicine & Rehabilitation

## 2023-08-26 ENCOUNTER — Ambulatory Visit: Payer: Medicare Other | Admitting: Nurse Practitioner

## 2023-08-26 DIAGNOSIS — M25571 Pain in right ankle and joints of right foot: Secondary | ICD-10-CM | POA: Insufficient documentation

## 2023-08-26 DIAGNOSIS — G8929 Other chronic pain: Secondary | ICD-10-CM | POA: Insufficient documentation

## 2023-09-21 ENCOUNTER — Telehealth: Payer: Self-pay | Admitting: Physical Medicine & Rehabilitation

## 2023-09-22 ENCOUNTER — Telehealth: Payer: Self-pay

## 2023-09-22 NOTE — Telephone Encounter (Signed)
Patient is calling because she wanted to inform you that she has finally did the stool sample and will bring it in tomorrow.

## 2023-09-22 NOTE — Telephone Encounter (Signed)
Noted thank you

## 2023-09-23 ENCOUNTER — Ambulatory Visit: Payer: Medicare Other | Admitting: Physical Medicine & Rehabilitation

## 2023-09-27 ENCOUNTER — Encounter: Payer: Self-pay | Admitting: Nurse Practitioner

## 2023-09-27 LAB — FECAL GLOBIN BY IMMUNOCHEMISTRY
FECAL GLOBIN RESULT:: NOT DETECTED
FECAL GLOBIN RESULT:: NOT DETECTED
FECAL GLOBIN RESULT:: NOT DETECTED
MICRO NUMBER:: 15975173
MICRO NUMBER:: 15975174
MICRO NUMBER:: 15975175
SPECIMEN QUALITY:: ADEQUATE
SPECIMEN QUALITY:: ADEQUATE
SPECIMEN QUALITY:: ADEQUATE

## 2023-10-21 ENCOUNTER — Ambulatory Visit: Payer: Medicare Other | Admitting: Physical Medicine & Rehabilitation

## 2023-11-11 ENCOUNTER — Ambulatory Visit: Payer: Medicare Other | Admitting: Nurse Practitioner

## 2023-11-11 VITALS — BP 132/74 | HR 103 | Temp 97.6°F | Resp 17 | Ht 62.0 in | Wt 142.6 lb

## 2023-11-11 DIAGNOSIS — K219 Gastro-esophageal reflux disease without esophagitis: Secondary | ICD-10-CM | POA: Diagnosis not present

## 2023-11-11 DIAGNOSIS — F32A Depression, unspecified: Secondary | ICD-10-CM | POA: Diagnosis not present

## 2023-11-11 DIAGNOSIS — I6930 Unspecified sequelae of cerebral infarction: Secondary | ICD-10-CM

## 2023-11-11 DIAGNOSIS — D509 Iron deficiency anemia, unspecified: Secondary | ICD-10-CM

## 2023-11-11 DIAGNOSIS — N1831 Chronic kidney disease, stage 3a: Secondary | ICD-10-CM | POA: Diagnosis not present

## 2023-11-11 DIAGNOSIS — R634 Abnormal weight loss: Secondary | ICD-10-CM

## 2023-11-11 DIAGNOSIS — E7800 Pure hypercholesterolemia, unspecified: Secondary | ICD-10-CM | POA: Diagnosis not present

## 2023-11-11 DIAGNOSIS — R739 Hyperglycemia, unspecified: Secondary | ICD-10-CM

## 2023-11-11 DIAGNOSIS — F419 Anxiety disorder, unspecified: Secondary | ICD-10-CM

## 2023-11-11 DIAGNOSIS — K5900 Constipation, unspecified: Secondary | ICD-10-CM

## 2023-11-11 DIAGNOSIS — M1711 Unilateral primary osteoarthritis, right knee: Secondary | ICD-10-CM

## 2023-11-11 DIAGNOSIS — G4700 Insomnia, unspecified: Secondary | ICD-10-CM

## 2023-11-11 NOTE — Progress Notes (Signed)
 Careteam: Patient Care Team: Sharon Seller, NP as PCP - General (Geriatric Medicine) Parke Poisson, MD as PCP - Cardiology (Cardiology)   PLACE OF SERVICE: [facility]  Advanced Directive information     No Known Allergies   Chief Complaint  Patient presents with   Medical Management of Chronic Issues    Patient is being seen for a 4 month follow up . Patient states she wants to discuss medication and driving     HPI: Patient is a 89 y.o. female presents for 4 month follow-up.   Accompanied by daughter.   Reports she is doing well. Still feels weak, mainly on her right side after the stroke. States she would like to stop taking antidepressant. Reports feeling like she goes into deep sleep and has "funny" dreams, has had episodes of sleep walking as well -- reports neurologist prescribed zoloft initially while she was in rehab to improve "neurologic symptoms" after the stroke. She was then started on Remeron to help improve with appetite as well.  States she does not feel depressed or anxious, she just feels overwhelmed with the significant life changes (husband's passing and stroke) that have occurred over the last year and is worried about the medication side effects. Reports she has great family support.  Daughter thinks she has been eating more fast food, drinking tea, and still feeling anxious. Patient has not been consistently taking Remeron, daughter reports she is taking 1/2 a dose.   Using a cane, daughter states her strength has regressed. Still having weakness in her right hand and leg/knee. Received cortisone injection for knee that helped with pain. Reports she is not steady on her feet. Denies falls. Still interested in driving.   Feels like her memory is good, only neurologic concern she has is she feels like her "mind and tongue don't connect" and cannot always express her thoughts.   Review of Systems:  Review of Systems  Constitutional:  Negative for  chills, fever, malaise/fatigue and weight loss.  Respiratory:  Negative for cough and shortness of breath.   Cardiovascular:  Negative for chest pain, palpitations and leg swelling.  Gastrointestinal:  Positive for constipation (occassional, treats with miralax, prune juice). Negative for blood in stool, diarrhea, nausea and vomiting.  Genitourinary:  Positive for urgency. Negative for dysuria and frequency.  Musculoskeletal:  Positive for joint pain. Negative for falls and myalgias.  Neurological:  Positive for weakness (right side). Negative for dizziness and headaches.  Psychiatric/Behavioral:  Negative for depression. The patient is not nervous/anxious and does not have insomnia.     Past Medical History:  Diagnosis Date   Arthritis    Arthropathy, unspecified, site unspecified    Dr. Noel Gerold...right clavicular head swelling 2003   Colon polyp    colonoscopy 06-24-06 with no adenomatous change, hyperplasia  only   Environmental allergies    GERD (gastroesophageal reflux disease)    Healthcare maintenance    CPX 12-24-09, Td 1/05, pneumovax 2000 age 30, GYN [name] ([place])   Hyperlipidemia    target less than 160 > try off muscle aches 03-18-10   Morbid obesity (HCC)    ideal = 142.  target wt = 158 for BMI < 30   Osteopenia    BMD 08-07-08 tspin - 1.2, left femur - .4, right femur +.2.  repeat 01-02-10  1.9    0.1   0.5   Rhinitis, chronic    Dr. Linward Foster...on immunotherapy since 5/08    Past Surgical History:  Procedure Laterality Date   BUNIONECTOMY  1982   bilateral   CHOLECYSTECTOMY N/A 07/17/2020   Procedure: LAPAROSCOPIC CHOLECYSTECTOMY;  Surgeon: Axel Filler, MD;  Location: [facility] OR;  Service: General;  Laterality: N/A;   FOOT SURGERY  1970   bilateral 5th toes, bones removed   GANGLION CYST EXCISION  2000   3rd finger right hand   TUBAL LIGATION  1971     Social History:   reports that she quit smoking about 68 years ago. Her smoking use included cigarettes.  She started smoking about 73 years ago. She has a 2.5 pack-year smoking history. She has never used smokeless tobacco. She reports that she does not drink alcohol and does not use drugs.  Family History  Problem Relation Age of Onset   Colon cancer Mother        (in computer for recall colonoscopy 10/12)   Healthy Daughter        x3   Healthy Son      Medications:  Patient's Medications  New Prescriptions   No medications on file  Previous Medications   ACETAMINOPHEN (TYLENOL) 325 MG TABLET    Take 1-2 tablets (325-650 mg total) by mouth every 4 (four) hours as needed for mild pain.   ASPIRIN EC 81 MG TABLET    Take 81 mg by mouth daily. Swallow whole.   CARBOXYMETHYLCELLUL-GLYCERIN (LUBRICATING EYE DROPS OP)    Place 1 drop into both eyes daily as needed (dry eyes).   CETIRIZINE (ZYRTEC) 10 MG TABLET    Take 10 mg by mouth at bedtime as needed for allergies (drippy nose, drainage, sneezing).   CHOLECALCIFEROL (VITAMIN D3) 25 MCG (1000 UT) CAPSULE    Take 1,000 Units by mouth daily.   CYANOCOBALAMIN (B-12 PO)    Take 1 tablet by mouth daily.   DICLOFENAC SODIUM (VOLTAREN) 1 % GEL    Apply 2-4 g topically 4 (four) times daily as needed (joint pain).   FLUTICASONE (FLONASE) 50 MCG/ACT NASAL SPRAY    Place 1 spray into both nostrils daily as needed for allergies.   MIRTAZAPINE (REMERON) 7.5 MG TABLET    TAKE 1 TABLET BY MOUTH AT BEDTIME.   POLYETHYLENE GLYCOL POWDER (GLYCOLAX/MIRALAX) 17 GM/SCOOP POWDER    Take 17 g by mouth daily.   ROSUVASTATIN (CRESTOR) 20 MG TABLET    Take 1 tablet (20 mg total) by mouth daily.  Modified Medications   No medications on file  Discontinued Medications   No medications on file     Physical Exam:  Vitals:   11/11/23 1028  BP: 132/74  Pulse: (!) 103  Resp: 17  Temp: 97.6 F (36.4 C)  TempSrc: Temporal  SpO2: 94%  Weight: 142 lb 9.6 oz (64.7 kg)  Height: 5\' 2"  (1.575 m)   Body mass index is 26.08 kg/m.  Wt Readings from Last 3 Encounters:   11/11/23 142 lb 9.6 oz (64.7 kg)  08/22/23 142 lb 3.2 oz (64.5 kg)  07/25/23 147 lb 3.2 oz (66.8 kg)     Physical Exam Constitutional:      Appearance: Normal appearance. She is normal weight.  Cardiovascular:     Rate and Rhythm: Normal rate and regular rhythm.     Pulses: Normal pulses.     Heart sounds: Normal heart sounds.  Pulmonary:     Effort: Pulmonary effort is normal.     Breath sounds: Normal breath sounds.  Abdominal:     General: Bowel sounds are normal.  Palpations: Abdomen is soft.  Skin:    General: Skin is warm and dry.  Neurological:     Mental Status: She is alert and oriented to person, place, and time. Mental status is at baseline.     Motor: Weakness (right side) present.     Gait: Gait abnormal (using cane).  Psychiatric:        Mood and Affect: Mood normal.        Behavior: Behavior normal.     Labs reviewed: Basic Metabolic Panel:  Recent Labs    03/18/23 1354 03/24/23 1240 03/31/23 0820 04/04/23 0704 06/03/23 1507 07/15/23 1131  NA 141   < > 136 134*  --  143  K 4.1   < > 4.4 4.1  --  4.3  CL 106   < > 104 105  --  108  CO2 27   < > 23 21*  --  24  GLUCOSE 80   < > 149* 96  --  89  BUN 15   < > 20 20  --  24  CREATININE 0.74   < > 1.06* 1.03*  --  0.80  CALCIUM 9.6   < > 9.6 9.4  --  10.0  MG  --   --   --   --  2.3  --   TSH 1.43  --   --   --   --   --    < > = values in this interval not displayed.   Liver Function Tests:  Recent Labs    03/24/23 1240 03/29/23 0732 07/15/23 1131  AST 25 18 18   ALT 30 21 16   ALKPHOS 55 49  --   BILITOT 0.8 0.7 0.4  PROT 7.3 6.3* 6.7  ALBUMIN 4.0 3.4*  --    No results for input(s): "LIPASE", "AMYLASE" in the last 8760 hours. No results for input(s): "AMMONIA" in the last 8760 hours. CBC:  Recent Labs    03/29/23 0732 04/04/23 0704 07/15/23 1131 08/22/23 1128  WBC 5.5 5.5 4.3 4.2  NEUTROABS 2.7  --  2,301 2,356  HGB 12.7 11.7* 10.4* 11.5*  HCT 38.4 35.1* 32.3* 36.3  MCV 97.5 96.7  100.0 96.5  PLT 216 212 259 236   Lipid Panel:  Recent Labs    11/22/22 1334 03/25/23 0309 07/15/23 1131  CHOL 206* 225* 207*  HDL 90 77 89  LDLCALC 101* 131* 97  TRIG 61 86 112  CHOLHDL 2.3 2.9 2.3   TSH:  Recent Labs    03/18/23 1354  TSH 1.43   A1C:  Lab Results  Component Value Date   HGBA1C 5.6 07/15/2023     Assessment/Plan   1. Pure hypercholesterolemia (Primary) -Encouraged dietary modifications and physical activity as tolerated -Continue rosuvastatin - Lipid panel - COMPLETE METABOLIC PANEL WITH GFR  2. Gastroesophageal reflux disease, unspecified whether esophagitis present -Symptoms controlled, not on medication -Encouraged dietary modifications  3. Stage 3a chronic kidney disease (HCC) -Chronic and stable -Encouraged proper hydration/nutrition -Avoid nephrotoxic medications - COMPLETE METABOLIC PANEL WITH GFR  4. Depression, unspecified depression type -Will discontinue Remeron as per patient request -Denies signs/symptoms of depression, reiterates feeling overwhelmed due significant life changes over the last year -- has great family support -Continue supportive care -Notify if changes in mood/appetite noted  5. Hyperglycemia -Last A1c stable -Encouraged dietary modifications and physical activity as tolerated  6. Late effect of cerebrovascular accident (CVA) -Continue ASA and rosuvastatin -Continue supportive care -No changes in  mood/behavior -Ongoing weakness - Ambulatory referral to Occupational Therapy for safe driving evaluation.   7. Insomnia, unspecified type -Ongoing -Encouraged sleep hygiene  8. Weight loss -Stable, denies poor appetite; would like to stop Remeron at this time -Notify if changes noted in weight  9. Constipation, unspecified constipation type -Intermittent, treated with dietary modifications and miralax as needed  10. Iron deficiency anemia, unspecified iron deficiency anemia type -No signs of acute blood  loss - follow up CBC with Differential/Platelet  11. Primary osteoarthritis of right knee -Ongoing weakness, pain improved after cortisone injection -Continue cane/walker assistance for unsteady gait - Ambulatory referral to Occupational Therapy   Return in about 3 months (around 02/11/2024) for mood, weight, routine follow up.  Rollen Sox, Haroldine Laws MSN-FNP Student -I personally was present during the history, physical exam and medical decision-making activities of this service and have verified that the service and findings are accurately documented in the student's note At[facility] [phone]

## 2023-11-12 LAB — CBC WITH DIFFERENTIAL/PLATELET
Absolute Lymphocytes: 1578 {cells}/uL (ref 850–3900)
Absolute Monocytes: 343 {cells}/uL (ref 200–950)
Basophils Absolute: 29 {cells}/uL (ref 0–200)
Basophils Relative: 0.6 %
Eosinophils Absolute: 132 {cells}/uL (ref 15–500)
Eosinophils Relative: 2.7 %
HCT: 36.2 % (ref 35.0–45.0)
Hemoglobin: 11.8 g/dL (ref 11.7–15.5)
MCH: 31.1 pg (ref 27.0–33.0)
MCHC: 32.6 g/dL (ref 32.0–36.0)
MCV: 95.3 fL (ref 80.0–100.0)
MPV: 11.3 fL (ref 7.5–12.5)
Monocytes Relative: 7 %
Neutro Abs: 2818 {cells}/uL (ref 1500–7800)
Neutrophils Relative %: 57.5 %
Platelets: 217 10*3/uL (ref 140–400)
RBC: 3.8 10*6/uL (ref 3.80–5.10)
RDW: 13.8 % (ref 11.0–15.0)
Total Lymphocyte: 32.2 %
WBC: 4.9 10*3/uL (ref 3.8–10.8)

## 2023-11-12 LAB — LIPID PANEL
Cholesterol: 194 mg/dL (ref <200)
HDL: 91 mg/dL (ref >=50)
LDL Cholesterol (Calc): 84 mg/dL
Non-HDL Cholesterol (Calc): 103 mg/dL (ref <130)
Total CHOL/HDL Ratio: 2.1 (calc) (ref <5.0)
Triglycerides: 94 mg/dL (ref <150)

## 2023-11-12 LAB — COMPLETE METABOLIC PANEL WITH GFR
AG Ratio: 1.8 (calc) (ref 1.0–2.5)
ALT: 15 U/L (ref 6–29)
AST: 18 U/L (ref 10–35)
Albumin: 4.4 g/dL (ref 3.6–5.1)
Alkaline phosphatase (APISO): 53 U/L (ref 37–153)
BUN/Creatinine Ratio: 20 (calc) (ref 6–22)
BUN: 19 mg/dL (ref 7–25)
CO2: 27 mmol/L (ref 20–32)
Calcium: 10.1 mg/dL (ref 8.6–10.4)
Chloride: 107 mmol/L (ref 98–110)
Creat: 0.96 mg/dL — ABNORMAL HIGH (ref 0.60–0.95)
Globulin: 2.5 g/dL (ref 1.9–3.7)
Glucose, Bld: 95 mg/dL (ref 65–139)
Potassium: 4.4 mmol/L (ref 3.5–5.3)
Sodium: 143 mmol/L (ref 135–146)
Total Bilirubin: 0.7 mg/dL (ref 0.2–1.2)
Total Protein: 6.9 g/dL (ref 6.1–8.1)
eGFR: 56 mL/min/{1.73_m2} — ABNORMAL LOW (ref >=60)

## 2023-11-14 ENCOUNTER — Encounter: Payer: Self-pay | Admitting: Nurse Practitioner

## 2023-12-05 ENCOUNTER — Other Ambulatory Visit: Payer: Self-pay | Admitting: Nurse Practitioner

## 2023-12-05 MED ORDER — ROSUVASTATIN CALCIUM 20 MG PO TABS: 20.0000 mg | ORAL_TABLET | Freq: Every day | ORAL | 1 refills | Status: DC

## 2023-12-07 ENCOUNTER — Telehealth: Payer: Self-pay | Admitting: Nurse Practitioner

## 2023-12-07 NOTE — Telephone Encounter (Signed)
 Copied from CRM [id number]. Topic: Referral - Question >> Dec 07, 2023  2:50 PM Adrianna P wrote: Please review message below:   Reason for CRM: patient contacted all recommended occupational therapy centers and they said that either they didn't have availability or only had physical therapy and not occupational. Patient would like to be contacted.

## 2023-12-08 NOTE — Telephone Encounter (Signed)
 This should go to the referral coordinator to send to OT

## 2024-02-13 ENCOUNTER — Ambulatory Visit: Admitting: Nurse Practitioner

## 2024-02-13 ENCOUNTER — Encounter: Payer: Self-pay | Admitting: Nurse Practitioner

## 2024-02-13 VITALS — BP 120/82 | HR 66 | Temp 97.0°F | Ht 62.0 in | Wt 143.8 lb

## 2024-02-13 DIAGNOSIS — I6990 Unspecified sequelae of unspecified cerebrovascular disease: Secondary | ICD-10-CM

## 2024-02-13 DIAGNOSIS — F325 Major depressive disorder, single episode, in full remission: Secondary | ICD-10-CM

## 2024-02-13 DIAGNOSIS — R634 Abnormal weight loss: Secondary | ICD-10-CM

## 2024-02-13 DIAGNOSIS — M1711 Unilateral primary osteoarthritis, right knee: Secondary | ICD-10-CM

## 2024-02-13 DIAGNOSIS — M545 Low back pain, unspecified: Secondary | ICD-10-CM

## 2024-02-13 DIAGNOSIS — N1831 Chronic kidney disease, stage 3a: Secondary | ICD-10-CM

## 2024-02-13 DIAGNOSIS — K5900 Constipation, unspecified: Secondary | ICD-10-CM

## 2024-02-13 DIAGNOSIS — G8929 Other chronic pain: Secondary | ICD-10-CM

## 2024-02-13 DIAGNOSIS — K219 Gastro-esophageal reflux disease without esophagitis: Secondary | ICD-10-CM | POA: Diagnosis not present

## 2024-02-13 NOTE — Patient Instructions (Addendum)
 Tylenol  is Acetaminophen   avoid NSAIDS (Aleve, Advil, Motrin, Ibuprofen)

## 2024-02-13 NOTE — Progress Notes (Signed)
 Careteam: Patient Care Team: Verma Gobble, NP as PCP - General (Geriatric Medicine) Mackenzie Herrlich, MD as PCP - Cardiology (Cardiology)  PLACE OF SERVICE:  [facility]  Advanced Directive information    No Known Allergies  Chief Complaint  Patient presents with   Medical Management of Chronic Issues    3 month follow up  Pt stated she's been having back problems and hip pain has been going on for a couple of months Pt has been taking tylenol  and sleeps with a heating pad and has also been using her back support pt stated it started out of nowhere // pt wants to know if it is arthritis and also has continuing pain in knee  Also wanted to talk with you about additional over the counter medictions and vitamins  Pt also wants to know how her cholesterol is doing // and anemia wants to know if she needs    HPI:  Discussed the use of AI scribe software for clinical note transcription with the patient, who gave verbal consent to proceed.  History of Present Illness [name] is a 89 year old female with osteoarthritis who presents with joint pain and mobility issues as part of her 3 month follow up.   She has been experiencing minor aches and pains for approximately two months, with constant knee pain being a significant issue. She received a cortisone injection in November, which provided relief. She is considering further treatment options such as another cortisone injection or gel injections.  In addition to knee pain, she experiences hip and back pain. She has a history of scoliosis diagnosed 25 years ago. Due to logistical issues, she has not engaged in physical therapy recently and is considering home health services for physical and occupational therapy. Her children are concerned about her driving due to her knee pain and hand weakness.  She has a history of a stroke, resulting in some weakness in her right hand. She has difficulty finding an occupational  therapist for her hand issues. Her right knee, right hand, left hip, and back are the primary areas of concern.  Her cholesterol levels have been monitored, showing a decrease from 131 to 97, and her hemoglobin has increased from 10.4 to 11.8. She is not currently taking Remeron  for depression or appetite stimulation. She is consistent with her cholesterol medication, taking it every night.  Her weight has remained stable, although she feels her food does not taste as good as it used to. She is not taking calcium  supplements but is on vitamin D . She uses Tylenol  for pain management, as ibuprofen causes dizziness and other side effects.   Review of Systems:  Review of Systems  Constitutional:  Negative for chills, fever and weight loss.  HENT:  Negative for tinnitus.   Respiratory:  Negative for cough, sputum production and shortness of breath.   Cardiovascular:  Negative for chest pain, palpitations and leg swelling.  Gastrointestinal:  Negative for abdominal pain, constipation, diarrhea and heartburn.  Genitourinary:  Negative for dysuria, frequency and urgency.  Musculoskeletal:  Positive for back pain and joint pain. Negative for falls and myalgias.  Skin: Negative.   Neurological:  Negative for dizziness and headaches.  Psychiatric/Behavioral:  Negative for depression and memory loss. The patient does not have insomnia.     Past Medical History:  Diagnosis Date   Arthritis    Arthropathy, unspecified, site unspecified    Dr. Faylene Hoots...right clavicular head swelling 2003   Colon  polyp    colonoscopy 06-24-06 with no adenomatous change, hyperplasia  only   CVA (cerebral vascular accident) (HCC) 03/28/2023   Environmental allergies    GERD (gastroesophageal reflux disease)    Healthcare maintenance    CPX 12-24-09, Td 1/05, pneumovax 2000 age 5, GYN [name] ([place])   Hyperlipidemia    target less than 160 > try off muscle aches 03-18-10   Morbid obesity (HCC)    ideal = 142.   target wt = 158 for BMI < 30   Osteopenia    BMD 08-07-08 tspin - 1.2, left femur - .4, right femur +.2.  repeat 01-02-10  1.9    0.1   0.5   Rhinitis, chronic    Dr. Antonina Kleine ...on immunotherapy since 5/08   Past Surgical History:  Procedure Laterality Date   BUNIONECTOMY  1982   bilateral   CHOLECYSTECTOMY N/A 07/17/2020   Procedure: LAPAROSCOPIC CHOLECYSTECTOMY;  Surgeon: Shela Derby, MD;  Location: [facility] OR;  Service: General;  Laterality: N/A;   FOOT SURGERY  1970   bilateral 5th toes, bones removed   GANGLION CYST EXCISION  2000   3rd finger right hand   TUBAL LIGATION  1971   Social History:   reports that she quit smoking about 68 years ago. Her smoking use included cigarettes. She started smoking about 73 years ago. She has a 2.5 pack-year smoking history. She has never used smokeless tobacco. She reports that she does not drink alcohol  and does not use drugs.  Family History  Problem Relation Age of Onset   Colon cancer Mother        (in computer for recall colonoscopy 10/12)   Healthy Daughter        x3   Healthy Son     Medications: Patient's Medications  New Prescriptions   No medications on file  Previous Medications   ACETAMINOPHEN  (TYLENOL ) 325 MG TABLET    Take 1-2 tablets (325-650 mg total) by mouth every 4 (four) hours as needed for mild pain.   ASPIRIN  EC 81 MG TABLET    Take 81 mg by mouth daily. Swallow whole.   CARBOXYMETHYLCELLUL-GLYCERIN (LUBRICATING EYE DROPS OP)    Place 1 drop into both eyes daily as needed (dry eyes).   CETIRIZINE  (ZYRTEC ) 10 MG TABLET    Take 10 mg by mouth at bedtime as needed for allergies (drippy nose, drainage, sneezing).   CHOLECALCIFEROL  (VITAMIN D3) 25 MCG (1000 UT) CAPSULE    Take 1,000 Units by mouth daily.   CYANOCOBALAMIN  (B-12 PO)    Take 1 tablet by mouth daily.   DICLOFENAC  SODIUM (VOLTAREN ) 1 % GEL    Apply 2-4 g topically 4 (four) times daily as needed (joint pain).   FLUTICASONE  (FLONASE ) 50 MCG/ACT NASAL  SPRAY    Place 1 spray into both nostrils daily as needed for allergies.   POLYETHYLENE GLYCOL POWDER (GLYCOLAX /MIRALAX ) 17 GM/SCOOP POWDER    Take 17 g by mouth daily.   ROSUVASTATIN  (CRESTOR ) 20 MG TABLET    Take 1 tablet (20 mg total) by mouth daily.  Modified Medications   No medications on file  Discontinued Medications   MIRTAZAPINE  (REMERON ) 7.5 MG TABLET    TAKE 1 TABLET BY MOUTH AT BEDTIME.    Physical Exam:  Vitals:   02/13/24 1044  BP: 120/82  Pulse: 66  Temp: (!) 97 F (36.1 C)  TempSrc: Temporal  SpO2: 98%  Weight: 143 lb 12.8 oz (65.2 kg)  Height: 5' 2 (1.575 m)  Body mass index is 26.3 kg/m. Wt Readings from Last 3 Encounters:  02/13/24 143 lb 12.8 oz (65.2 kg)  11/11/23 142 lb 9.6 oz (64.7 kg)  08/22/23 142 lb 3.2 oz (64.5 kg)    Physical Exam Constitutional:      General: She is not in acute distress.    Appearance: She is well-developed. She is not diaphoretic.  HENT:     Head: Normocephalic and atraumatic.     Mouth/Throat:     Pharynx: No oropharyngeal exudate.   Eyes:     Conjunctiva/sclera: Conjunctivae normal.     Pupils: Pupils are equal, round, and reactive to light.    Cardiovascular:     Rate and Rhythm: Normal rate and regular rhythm.  Pulmonary:     Effort: Pulmonary effort is normal.     Breath sounds: Normal breath sounds.  Abdominal:     General: Bowel sounds are normal.     Palpations: Abdomen is soft.   Musculoskeletal:     Cervical back: Normal range of motion and neck supple.     Lumbar back: Tenderness and bony tenderness present. Positive right straight leg raise test and positive left straight leg raise test.       Back:     Right lower leg: No edema.     Left lower leg: No edema.   Skin:    General: Skin is warm and dry.   Neurological:     Mental Status: She is alert and oriented to person, place, and time.   Psychiatric:        Mood and Affect: Mood normal.     Labs reviewed: Basic Metabolic  Panel: Recent Labs    03/18/23 1354 03/24/23 1240 04/04/23 0704 06/03/23 1507 07/15/23 1131 11/11/23 1137  NA 141   < > 134*  --  143 143  K 4.1   < > 4.1  --  4.3 4.4  CL 106   < > 105  --  108 107  CO2 27   < > 21*  --  24 27  GLUCOSE 80   < > 96  --  89 95  BUN 15   < > 20  --  24 19  CREATININE 0.74   < > 1.03*  --  0.80 0.96*  CALCIUM  9.6   < > 9.4  --  10.0 10.1  MG  --   --   --  2.3  --   --   TSH 1.43  --   --   --   --   --    < > = values in this interval not displayed.   Liver Function Tests: Recent Labs    03/24/23 1240 03/29/23 0732 07/15/23 1131 11/11/23 1137  AST 25 18 18 18   ALT 30 21 16 15   ALKPHOS 55 49  --   --   BILITOT 0.8 0.7 0.4 0.7  PROT 7.3 6.3* 6.7 6.9  ALBUMIN 4.0 3.4*  --   --    No results for input(s): LIPASE, AMYLASE in the last 8760 hours. No results for input(s): AMMONIA in the last 8760 hours. CBC: Recent Labs    07/15/23 1131 08/22/23 1128 11/11/23 1137  WBC 4.3 4.2 4.9  NEUTROABS 2,301 2,356 2,818  HGB 10.4* 11.5* 11.8  HCT 32.3* 36.3 36.2  MCV 100.0 96.5 95.3  PLT 259 236 217   Lipid Panel: Recent Labs    03/25/23 0309 07/15/23 1131 11/11/23 1137  CHOL 225*  207* 194  HDL 77 89 91  LDLCALC 131* 97 84  TRIG 86 112 94  CHOLHDL 2.9 2.3 2.1   TSH: Recent Labs    03/18/23 1354  TSH 1.43   A1C: Lab Results  Component Value Date   HGBA1C 5.6 07/15/2023     Assessment/Plan Primary osteoarthritis of right knee Assessment & Plan: Ongoing, continues to follow up with ortho for injections. PT/OT ordered  Orders: -     Ambulatory referral to Home Health  Late effects of CVA (cerebrovascular accident) Assessment & Plan: Stable, continues to have some residual weakness, PT/OT ordered  Orders: -     Ambulatory referral to Home Health  Chronic bilateral low back pain without sciatica Assessment & Plan: Pain noted to lower back, will get home health for PT/OT evaluation. Continue with tylenol  prn,  heating pad TID and can use muscle rub after heat   Gastroesophageal reflux disease, unspecified whether esophagitis present Assessment & Plan: Controlled, will use OTC pepcid if needed   Weight loss Assessment & Plan: Stable at this time, off remeron . Will continue to follow   Constipation, unspecified constipation type Assessment & Plan: Controlled on current regimen    Stage 3a chronic kidney disease (HCC) Assessment & Plan: Chronic and stable Encourage proper hydration Follow metabolic panel Avoid nephrotoxic meds (NSAIDS)   Major depressive disorder in full remission, unspecified whether recurrent [facility]) Assessment & Plan: Off medication at this time and doing well with lifestyle modifications      Return in about 3 months (around 05/15/2024) for routine follow up, labs at time of visit.  Mikaili Flippin K. Denney Fisherman [facility] [phone]

## 2024-02-15 DIAGNOSIS — M1711 Unilateral primary osteoarthritis, right knee: Secondary | ICD-10-CM | POA: Insufficient documentation

## 2024-02-15 DIAGNOSIS — I6990 Unspecified sequelae of unspecified cerebrovascular disease: Secondary | ICD-10-CM | POA: Insufficient documentation

## 2024-02-15 DIAGNOSIS — G8929 Other chronic pain: Secondary | ICD-10-CM | POA: Insufficient documentation

## 2024-02-17 ENCOUNTER — Encounter: Payer: Self-pay | Admitting: Nurse Practitioner

## 2024-02-17 DIAGNOSIS — R634 Abnormal weight loss: Secondary | ICD-10-CM | POA: Insufficient documentation

## 2024-02-17 NOTE — Assessment & Plan Note (Signed)
 Stable at this time, off remeron . Will continue to follow

## 2024-02-17 NOTE — Assessment & Plan Note (Signed)
 Stable, continues to have some residual weakness, PT/OT ordered

## 2024-02-17 NOTE — Assessment & Plan Note (Signed)
 Chronic and stable Encourage proper hydration Follow metabolic panel Avoid nephrotoxic meds (NSAIDS)

## 2024-02-17 NOTE — Assessment & Plan Note (Signed)
 Pain noted to lower back, will get home health for PT/OT evaluation. Continue with tylenol  prn, heating pad TID and can use muscle rub after heat

## 2024-02-17 NOTE — Assessment & Plan Note (Signed)
 Controlled on current regimen.

## 2024-02-17 NOTE — Assessment & Plan Note (Signed)
 Ongoing, continues to follow up with ortho for injections. PT/OT ordered

## 2024-02-17 NOTE — Assessment & Plan Note (Signed)
 Controlled, will use OTC pepcid if needed

## 2024-02-17 NOTE — Assessment & Plan Note (Signed)
 Off medication at this time and doing well with lifestyle modifications

## 2024-02-24 ENCOUNTER — Encounter: Payer: Self-pay | Admitting: Physical Medicine & Rehabilitation

## 2024-02-24 ENCOUNTER — Encounter: Attending: Physical Medicine & Rehabilitation | Admitting: Physical Medicine & Rehabilitation

## 2024-02-24 VITALS — BP 132/61 | HR 77 | Ht 62.0 in | Wt 143.6 lb

## 2024-02-24 DIAGNOSIS — M1711 Unilateral primary osteoarthritis, right knee: Secondary | ICD-10-CM | POA: Insufficient documentation

## 2024-02-24 DIAGNOSIS — M545 Low back pain, unspecified: Secondary | ICD-10-CM | POA: Diagnosis present

## 2024-02-24 DIAGNOSIS — G8929 Other chronic pain: Secondary | ICD-10-CM | POA: Insufficient documentation

## 2024-02-24 MED ORDER — METHOCARBAMOL 500 MG PO TABS: 500.0000 mg | ORAL_TABLET | Freq: Three times a day (TID) | ORAL | 0 refills | Status: DC | PRN

## 2024-02-24 NOTE — Progress Notes (Signed)
 Subjective:    Patient ID: [name], female    DOB: [birth year], 89 y.o.   MRN: [id number]  HPI   Hospital Discharge summary 04/05/23   Brief HPI:   [name] is a 89 y.o. female presented to [facility] complaining of worsening right upper arm weakness. She had been reporting the symptoms for approximately 2 months per family report. She was recently treated for cervical spondylolisthesis and radiculopathy as well as dizziness. First son stated 11 hours prior to admission her right arm weakness worsened significantly. Stroke was activated and she was seen in consultation by Dr. Doretta Gant. She was out of the window for tenecteplase. Aspirin  and Plavix  started. CTA was not performed as part of her code stroke because exam was not consistent with LVO. Brain revealed scattered acute to subacute patchy ischemic infarctions evolving cortical and subcortical aspect of the left frontal and parietal lobes. No hemorrhage noted. MRI of the C-spine without acute abnormality. She underwent 2D echo with ejection fraction of approximately 60 to 65%. LDL 131 and hemoglobin A1c 6.0%. She was started on heparin  subcutaneously for VTE prophylaxis. Neurology recommends a 30-day heart monitor on discharge. Her social history is significant for loss of her husband in January of this year after 60+ years of marriage and she is exhibiting significant depression. Zoloft  started. Neurology plans to continue DAPT for 3 months./OT/SLP evaluations obtained. He is tolerating regular texture diet. Currently requiring min assist for bed mobility and transfers, using quite cane for ambulation.      Hospital Course: [name] was admitted to rehab 03/28/2023 for inpatient therapies to consist of PT, ST and OT at least three hours five days a week. Past admission physiatrist, therapy team and rehab RN have worked together to provide customized collaborative  inpatient rehab. Wrist cock-up splint applied to right. Patient requested to have Zoloft  decreased to 12.5 mg as she was concerned this was causing dizziness. Zyrtec  given for allergic rhinitis with improvement. BUN has normalized and serum creatinine is in the range of 1.01-1.06. Serum glucose range is 96-149.    Blood pressures were monitored on TID basis and found to have orthostatic hypotension. TED hose and abdominal binder placed. Encouraged PO fluids.   Rehab course: During patient's stay in rehab weekly team conferences were held to monitor patient's progress, set goals and discuss barriers to discharge. At admission, patient required min with mobility, min A with basic self-care skills.   She has had improvement in activity tolerance, balance, postural control as well as ability to compensate for deficits. She has had improvement in functional use RUE/LUE  and RLE/LLE as well as improvement in awareness. Patient has met 11 of 11 long term goals due to improved activity tolerance, improved balance, increased strength, ability to compensate for deficits, improved attention, improved awareness, and improved coordination.  Patient to discharge at an ambulatory level Supervision.   Patient's care partner is independent to provide the necessary physical assistance at discharge   Interval History 05/30/23 [name] is here for follow-up after CVA and completion of treatment at [facility].  She is here with her daughter and and they are both very pleasant.  [name] had been working with outpatient physical and Occupational Therapy.  She reports she is over all making progress with her function and is doing well overall since her stroke.  She does continue to have some pain in her right arm since the  stroke.  She wears a wrist brace on her right upper extremity.  She is working with occupational therapy and different techniques to help relieve this.  She is ambulating with a rollator.  She has stopped using  her Zoloft , patient feels that she is not depressed.  Patient's daughter thinks that her mood may continue to be decreased from her prior baseline.  Patient reports that her greatest pain currently is her right knee.  She has used IcyHot, Aspercreme, Biofreeze for her knee pain.  She has also had oral steroids for this issue.  She has previously declined steroid injections.  Interval History 07/25/23 [name] is here with her daughter for right knee injection.  She has previously been diagnosed with osteoarthritis of the right knee.  She has had oral prednisone  but she has been hesitant to do a knee injection this in the past because she has been concerned that will be too painful.  Today she is agreeable to try a knee injection.  She continues to have right knee pain worsened with activity.  She has also been having some pain at her right ankle, also worsened with activity.  Both these areas have been a little bit more swollen the past few days.  She does have chronic dependent edema in her legs.  She reports her mood is doing okay.  She was seen by neurology and cardiology in early October.  Interval History 02/24/24 Patient is here with her daughter for follow-up.  Reports knee injection was helpful prior visit, however with time and she would like repeat injection.  She is also been having back pain.  Back pain has been chronic but worsened over the past 2 months.  She is currently working with PT and OT for balance ordered by her PCP.  Her ankle pain has resolved.  Pain Inventory Average Pain 5 Pain Right Now 5 My pain is sharp, burning, and stabbing  In the last 24 hours, has pain interfered with the following? General activity 4 Relation with others 4 Enjoyment of life 1 What TIME of day is your pain at its worst? night Sleep (in general) Fair  Pain is worse with: bending, inactivity, standing, and some activites Pain improves with: heat/ice, therapy/exercise, medication, and  injections Relief from Meds: 7  Family History  Problem Relation Age of Onset   Colon cancer Mother        (in computer for recall colonoscopy 10/12)   Healthy Daughter        x3   Healthy Son    Social History   Socioeconomic History   Marital status: Widowed    Spouse name: Not on file   Number of children: 4   Years of education: Not on file   Highest education level: Associate degree: occupational, Scientist, product/process development, or vocational program  Occupational History   Not on file  Tobacco Use   Smoking status: Former    Current packs/day: 0.00    Average packs/day: 0.5 packs/day for 5.0 years (2.5 ttl pk-yrs)    Types: Cigarettes    Start date: 09/06/1950    Quit date: 09/07/1955    Years since quitting: 68.5   Smokeless tobacco: Never  Vaping Use   Vaping status: Never Used  Substance and Sexual Activity   Alcohol  use: No    Alcohol /week: 0.0 standard drinks of alcohol    Drug use: No   Sexual activity: Not Currently  Other Topics Concern   Not on file  Social History Narrative  Exercise - 2 times weekly   Caffeine - 2 cups daily   Negative history of passive tobacco smoke exposure   Lives with husband of 59 years in one story home.   Has 4 children.   Retired from [employer].    Social Drivers of Corporate investment banker Strain: Low Risk  (11/07/2023)   Overall Financial Resource Strain (CARDIA)    Difficulty of Paying Living Expenses: Not very hard  Food Insecurity: No Food Insecurity (11/07/2023)   Hunger Vital Sign    Worried About Running Out of Food in the Last Year: Never true    Ran Out of Food in the Last Year: Never true  Transportation Needs: No Transportation Needs (11/07/2023)   PRAPARE - Administrator, Civil Service (Medical): No    Lack of Transportation (Non-Medical): No  Physical Activity: Unknown (11/07/2023)   Exercise Vital Sign    Days of Exercise per Week: 0 days    Minutes of Exercise per Session: Not on file  Stress: Stress Concern  Present (11/07/2023)   [employer] of Occupational Health - Occupational Stress Questionnaire    Feeling of Stress : To some extent  Social Connections: Moderately Integrated (11/07/2023)   Social Connection and Isolation Panel    Frequency of Communication with Friends and Family: More than three times a week    Frequency of Social Gatherings with Friends and Family: More than three times a week    Attends Religious Services: More than 4 times per year    Active Member of [employer] or Organizations: Yes    Attends Banker Meetings: More than 4 times per year    Marital Status: Widowed   Past Surgical History:  Procedure Laterality Date   BUNIONECTOMY  1982   bilateral   CHOLECYSTECTOMY N/A 07/17/2020   Procedure: LAPAROSCOPIC CHOLECYSTECTOMY;  Surgeon: Shela Derby, MD;  Location: [facility] OR;  Service: General;  Laterality: N/A;   FOOT SURGERY  1970   bilateral 5th toes, bones removed   GANGLION CYST EXCISION  2000   3rd finger right hand   TUBAL LIGATION  1971   Past Surgical History:  Procedure Laterality Date   BUNIONECTOMY  1982   bilateral   CHOLECYSTECTOMY N/A 07/17/2020   Procedure: LAPAROSCOPIC CHOLECYSTECTOMY;  Surgeon: Shela Derby, MD;  Location: [facility] OR;  Service: General;  Laterality: N/A;   FOOT SURGERY  1970   bilateral 5th toes, bones removed   GANGLION CYST EXCISION  2000   3rd finger right hand   TUBAL LIGATION  1971   Past Medical History:  Diagnosis Date   Arthritis    Arthropathy, unspecified, site unspecified    Dr. Faylene Hoots...right clavicular head swelling 2003   Colon polyp    colonoscopy 06-24-06 with no adenomatous change, hyperplasia  only   CVA (cerebral vascular accident) (HCC) 03/28/2023   Environmental allergies    GERD (gastroesophageal reflux disease)    Healthcare maintenance    CPX 12-24-09, Td 1/05, pneumovax 2000 age 89, [name] ([place])   Hyperlipidemia    target less than 160 > try off muscle aches 03-18-10    Morbid obesity (HCC)    ideal = 142.  target wt = 158 for BMI < 30   Osteopenia    BMD 08-07-08 tspin - 1.2, left femur - .4, right femur +.2.  repeat 01-02-10  1.9    0.1   0.5   Rhinitis, chronic    Dr. Salbador Crate ...on immunotherapy  since 5/08   BP 132/61 (BP Location: Left Arm, Patient Position: Sitting)   Pulse 77   Ht 5' 2 (1.575 m)   Wt 143 lb 9.6 oz (65.1 kg)   SpO2 98%   BMI 26.26 kg/m   Opioid Risk Score:   Fall Risk Score:  `1  Depression screen [facility] 2/9     02/24/2024    3:16 PM 02/13/2024   10:40 AM 11/11/2023   10:32 AM 11/11/2023   10:31 AM 08/22/2023   11:01 AM 07/25/2023   10:42 AM 04/18/2023   11:30 AM  Depression screen PHQ 2/9  Decreased Interest 0 0 0 0 0 0 0  Down, Depressed, Hopeless 0 0 0 0 0 0 0  PHQ - 2 Score 0 0 0 0 0 0 0  Altered sleeping 0 0 0    0  Tired, decreased energy 0 1 0    1  Change in appetite 0 2 0    1  Feeling bad or failure about yourself  0 0 0    0  Trouble concentrating 0 0 0    0  Moving slowly or fidgety/restless 0 3 0    1  Suicidal thoughts 0 0     0  PHQ-9 Score 0 6 0    3  Difficult doing work/chores Not difficult at all Not difficult at all     Somewhat difficult      Review of Systems  Musculoskeletal:        Right shoulder pain Right knee pain  All other systems reviewed and are negative.      Objective:   Physical Exam    02/24/2024    3:15 PM 02/13/2024   10:44 AM 11/11/2023   10:28 AM  Vitals with BMI  Height 5' 2 5' 2 5' 2  Weight 143 lbs 10 oz 143 lbs 13 oz 142 lbs 10 oz  BMI 26.26 26.29 26.08  Systolic 132 120 161  Diastolic 61 82 74  Pulse 77 66 103    Gen: no distress, normal appearing HEENT: oral mucosa pink and moist, NCAT Chest: normal effort, normal rate of breathing Abd: soft, non-distended Ext: B/L LE edema R > L Psych: pleasant, normal affect, very pleasant  Skin: intact Neuro: Alert and oriented, normal speech and language.  Fair insight and awareness.  No dysarthria or aphasia  noted. Strength 5 out of 5 left upper extremity Strength 4 out of 5 right upper extremity Strength bilateral lower extremities 4+ out of 5 Sensation intact to light touch in all 4 extremities. Musculoskeletal:  Walks with a cane Right knee tender to palpation joint line Arthritic changes noted in bilateral hands L spine TTP b/l and left SI joint  Facet loading positive pain with extension L spine   R knee xray 07/24/24  FINDINGS: No evidence of fracture, dislocation, or joint effusion. There is tricompartmental joint space narrowing, sclerosis and osteophytes consistent with degenerative joint disease. No evidence of effusion.   IMPRESSION: Degenerative changes.      Assessment & Plan:    1) Left frontal and parietal lobe infarcts small vessel disease vs cardio-embolic due to A-fib.   -Continue follow-up with neurology as directed. She is currently on ASA.    - Patient reports she is doing home PT OT ordered by PCP for balance  2) Right knee pain likely due to OA/right upper extremity pain.   -Continue Voltaren  gel  -She also has R ankle pain, order  R knee and R ankle xray studies   -Reports benefit with R knee injection prior visit  -Schedule for repeat R knee injection  3) Mood disorder, denies SI or HI  -Previously on Remeron  at bedtime by neurology.    4) Lower extremity edema.  -Discussed use of compression stockings, leg elevation above the heart when in bed, salt moderation prior visit  -R ankle repeat xray improved, pain has resolved   5) Chronic lower back pain  -Xray L spine ordered   -Robaxin  500mg  Q8h PRN

## 2024-02-27 ENCOUNTER — Telehealth: Payer: Self-pay | Admitting: *Deleted

## 2024-02-27 NOTE — Telephone Encounter (Signed)
 Copied from CRM [id number]. Topic: Clinical - Home Health Verbal Orders >> Feb 27, 2024 10:19 AM Laurier BROCKS wrote: Caller/Agency: [facility] Callback Number: [phone] Secured VM  Service Requested: Physical Therapy Occupational Therapy  Frequency: 1 week  Any new concerns about the patient? No      Verbal Orders given to [facility] with [facility]

## 2024-03-06 ENCOUNTER — Telehealth

## 2024-03-06 NOTE — Telephone Encounter (Signed)
 Call returned to [facility] and verbal orders provided as requested per [facility] standing protocol

## 2024-03-06 NOTE — Telephone Encounter (Signed)
 Copied from CRM [id number]. Topic: Clinical - Home Health Verbal Orders >> Mar 06, 2024 10:00 AM Miquel SAILOR wrote: Caller/Agency: [facility] from [facility] Callback Number: [phone] Service Requested: Occupational Therapy Frequency: 1 week 1/2 week 3 /1 week 1 Improvement of strength with hands for use daily activity  Any new concerns about the patient? No

## 2024-03-12 DIAGNOSIS — M810 Age-related osteoporosis without current pathological fracture: Secondary | ICD-10-CM

## 2024-03-12 DIAGNOSIS — M1711 Unilateral primary osteoarthritis, right knee: Secondary | ICD-10-CM | POA: Diagnosis not present

## 2024-03-12 DIAGNOSIS — N183 Chronic kidney disease, stage 3 unspecified: Secondary | ICD-10-CM | POA: Diagnosis not present

## 2024-03-12 DIAGNOSIS — Z7982 Long term (current) use of aspirin: Secondary | ICD-10-CM

## 2024-03-12 DIAGNOSIS — I129 Hypertensive chronic kidney disease with stage 1 through stage 4 chronic kidney disease, or unspecified chronic kidney disease: Secondary | ICD-10-CM | POA: Diagnosis not present

## 2024-03-12 DIAGNOSIS — Z87891 Personal history of nicotine dependence: Secondary | ICD-10-CM

## 2024-03-22 ENCOUNTER — Ambulatory Visit (HOSPITAL_BASED_OUTPATIENT_CLINIC_OR_DEPARTMENT_OTHER)
Admission: RE | Admit: 2024-03-22 | Discharge: 2024-03-22 | Disposition: A | Discharge status: Home / Self Care | Source: Ambulatory Visit | Attending: Physical Medicine & Rehabilitation | Admitting: Physical Medicine & Rehabilitation

## 2024-03-22 ENCOUNTER — Other Ambulatory Visit: Payer: Self-pay | Admitting: Physical Medicine & Rehabilitation

## 2024-03-22 DIAGNOSIS — M545 Low back pain, unspecified: Secondary | ICD-10-CM | POA: Diagnosis present

## 2024-03-22 DIAGNOSIS — G8929 Other chronic pain: Secondary | ICD-10-CM | POA: Insufficient documentation

## 2024-03-26 ENCOUNTER — Encounter: Attending: Physical Medicine & Rehabilitation | Admitting: Physical Medicine & Rehabilitation

## 2024-03-26 ENCOUNTER — Encounter: Payer: Self-pay | Admitting: Physical Medicine & Rehabilitation

## 2024-03-26 VITALS — BP 119/62 | HR 70 | Ht 62.0 in | Wt 143.4 lb

## 2024-03-26 DIAGNOSIS — M1711 Unilateral primary osteoarthritis, right knee: Secondary | ICD-10-CM | POA: Insufficient documentation

## 2024-03-26 DIAGNOSIS — G8929 Other chronic pain: Secondary | ICD-10-CM | POA: Diagnosis present

## 2024-03-26 DIAGNOSIS — M545 Low back pain, unspecified: Secondary | ICD-10-CM | POA: Insufficient documentation

## 2024-03-26 MED ORDER — TRIAMCINOLONE ACETONIDE 40 MG/ML IJ SUSP
40.0000 mg | Freq: Once | INTRAMUSCULAR | Status: AC
  Administered 2024-03-26: 40 mg via INTRAMUSCULAR

## 2024-03-26 MED ORDER — LIDOCAINE HCL 1 % IJ SOLN
1.0000 mL | Freq: Once | INTRAMUSCULAR | Status: AC
  Administered 2024-03-26: 1 mL via INTRADERMAL

## 2024-03-26 NOTE — Progress Notes (Signed)
 Subjective:    Patient ID: [name], female    DOB: [birth year], 89 y.o.   MRN: [id number]  HPI   Hospital Discharge summary 04/05/23   Brief HPI:   [name] is a 89 y.o. female presented to [facility] health emergency department at [facility] on 03/24/2023 complaining of worsening right upper arm weakness. She had been reporting the symptoms for approximately 2 months per family report. She was recently treated for cervical spondylolisthesis and radiculopathy as well as dizziness. First son stated 11 hours prior to admission her right arm weakness worsened significantly. Stroke was activated and she was seen in consultation by Dr. Matthews. She was out of the window for tenecteplase. Aspirin  and Plavix  started. CTA was not performed as part of her code stroke because exam was not consistent with LVO. Brain revealed scattered acute to subacute patchy ischemic infarctions evolving cortical and subcortical aspect of the left frontal and parietal lobes. No hemorrhage noted. MRI of the C-spine without acute abnormality. She underwent 2D echo with ejection fraction of approximately 60 to 65%. LDL 131 and hemoglobin A1c 6.0%. She was started on heparin  subcutaneously for VTE prophylaxis. Neurology recommends a 30-day heart monitor on discharge. Her social history is significant for loss of her husband in January of this year after 60+ years of marriage and she is exhibiting significant depression. Zoloft  started. Neurology plans to continue DAPT for 3 months./OT/SLP evaluations obtained. He is tolerating regular texture diet. Currently requiring min assist for bed mobility and transfers, using quite cane for ambulation.      Hospital Course: [name] was admitted to rehab 03/28/2023 for inpatient therapies to consist of PT, ST and OT at least three hours five days a week. Past admission physiatrist, therapy team and rehab RN have worked together to provide customized collaborative  inpatient rehab. Wrist cock-up splint applied to right. Patient requested to have Zoloft  decreased to 12.5 mg as she was concerned this was causing dizziness. Zyrtec  given for allergic rhinitis with improvement. BUN has normalized and serum creatinine is in the range of 1.01-1.06. Serum glucose range is 96-149.    Blood pressures were monitored on TID basis and found to have orthostatic hypotension. TED hose and abdominal binder placed. Encouraged PO fluids.   Rehab course: During patient's stay in rehab weekly team conferences were held to monitor patient's progress, set goals and discuss barriers to discharge. At admission, patient required min with mobility, min A with basic self-care skills.   She has had improvement in activity tolerance, balance, postural control as well as ability to compensate for deficits. She has had improvement in functional use RUE/LUE  and RLE/LLE as well as improvement in awareness. Patient has met 11 of 11 long term goals due to improved activity tolerance, improved balance, increased strength, ability to compensate for deficits, improved attention, improved awareness, and improved coordination.  Patient to discharge at an ambulatory level Supervision.   Patient's care partner is independent to provide the necessary physical assistance at discharge   Interval History 05/30/23 [name] is here for follow-up after CVA and completion of treatment at [facility].  She is here with her daughter and and they are both very pleasant.  [name] had been working with outpatient physical and Occupational Therapy.  She reports she is over all making progress with her function and is doing well overall since her stroke.  She does continue to have some pain in her right arm since the  stroke.  She wears a wrist brace on her right upper extremity.  She is working with occupational therapy and different techniques to help relieve this.  She is ambulating with a rollator.  She has stopped using  her Zoloft , patient feels that she is not depressed.  Patient's daughter thinks that her mood may continue to be decreased from her prior baseline.  Patient reports that her greatest pain currently is her right knee.  She has used IcyHot, Aspercreme, Biofreeze for her knee pain.  She has also had oral steroids for this issue.  She has previously declined steroid injections.  Interval History 07/25/23 [name] is here with her daughter for right knee injection.  She has previously been diagnosed with osteoarthritis of the right knee.  She has had oral prednisone  but she has been hesitant to do a knee injection this in the past because she has been concerned that will be too painful.  Today she is agreeable to try a knee injection.  She continues to have right knee pain worsened with activity.  She has also been having some pain at her right ankle, also worsened with activity.  Both these areas have been a little bit more swollen the past few days.  She does have chronic dependent edema in her legs.  She reports her mood is doing okay.  She was seen by neurology and cardiology in early October.  Interval History 02/24/24 Patient is here with her daughter for follow-up.  Reports knee injection was helpful prior visit, however with time and she would like repeat injection.  She is also been having back pain.  Back pain has been chronic but worsened over the past 2 months.  She is currently working with PT and OT for balance ordered by her PCP.  Her ankle pain has resolved.  Interval History 03/26/2024 Patient is here for right knee injection, reports this helped previously when completed 07/25/2023 but has worn off.  She continues to have lower back pain, recently had x-ray L-spine completed and results are pending.  She reports mild benefit with Robaxin .  She also reports she has a TENS unit.  Pain Inventory Average Pain 5 Pain Right Now 5 My pain is sharp, burning, and stabbing  In the last 24 hours,  has pain interfered with the following? General activity 4 Relation with others 4 Enjoyment of life 1 What TIME of day is your pain at its worst? night Sleep (in general) Fair  Pain is worse with: bending, inactivity, standing, and some activites Pain improves with: heat/ice, therapy/exercise, medication, and injections Relief from Meds: 7  Family History  Problem Relation Age of Onset   Colon cancer Mother        (in computer for recall colonoscopy 10/12)   Healthy Daughter        x3   Healthy Son    Social History   Socioeconomic History   Marital status: Widowed    Spouse name: Not on file   Number of children: 4   Years of education: Not on file   Highest education level: Associate degree: occupational, Scientist, product/process development, or vocational program  Occupational History   Not on file  Tobacco Use   Smoking status: Former    Current packs/day: 0.00    Average packs/day: 0.5 packs/day for 5.0 years (2.5 ttl pk-yrs)    Types: Cigarettes    Start date: 09/06/1950    Quit date: 09/07/1955    Years since quitting: 68.5   Smokeless  tobacco: Never  Vaping Use   Vaping status: Never Used  Substance and Sexual Activity   Alcohol  use: No    Alcohol /week: 0.0 standard drinks of alcohol    Drug use: No   Sexual activity: Not Currently  Other Topics Concern   Not on file  Social History Narrative   Exercise - 2 times weekly   Caffeine - 2 cups daily   Negative history of passive tobacco smoke exposure   Lives with husband of 59 years in one story home.   Has 4 children.   Retired from [employer].    Social Drivers of Corporate investment banker Strain: Low Risk  (11/07/2023)   Overall Financial Resource Strain (CARDIA)    Difficulty of Paying Living Expenses: Not very hard  Food Insecurity: No Food Insecurity (11/07/2023)   Hunger Vital Sign    Worried About Running Out of Food in the Last Year: Never true    Ran Out of Food in the Last Year: Never true  Transportation Needs: No  Transportation Needs (11/07/2023)   PRAPARE - Administrator, Civil Service (Medical): No    Lack of Transportation (Non-Medical): No  Physical Activity: Unknown (11/07/2023)   Exercise Vital Sign    Days of Exercise per Week: 0 days    Minutes of Exercise per Session: Not on file  Stress: Stress Concern Present (11/07/2023)   [employer] of Occupational Health - Occupational Stress Questionnaire    Feeling of Stress : To some extent  Social Connections: Moderately Integrated (11/07/2023)   Social Connection and Isolation Panel    Frequency of Communication with Friends and Family: More than three times a week    Frequency of Social Gatherings with Friends and Family: More than three times a week    Attends Religious Services: More than 4 times per year    Active Member of [employer] or Organizations: Yes    Attends Banker Meetings: More than 4 times per year    Marital Status: Widowed   Past Surgical History:  Procedure Laterality Date   BUNIONECTOMY  1982   bilateral   CHOLECYSTECTOMY N/A 07/17/2020   Procedure: LAPAROSCOPIC CHOLECYSTECTOMY;  Surgeon: Rubin Calamity, MD;  Location: [facility] OR;  Service: General;  Laterality: N/A;   FOOT SURGERY  1970   bilateral 5th toes, bones removed   GANGLION CYST EXCISION  2000   3rd finger right hand   TUBAL LIGATION  1971   Past Surgical History:  Procedure Laterality Date   BUNIONECTOMY  1982   bilateral   CHOLECYSTECTOMY N/A 07/17/2020   Procedure: LAPAROSCOPIC CHOLECYSTECTOMY;  Surgeon: Rubin Calamity, MD;  Location: [facility] OR;  Service: General;  Laterality: N/A;   FOOT SURGERY  1970   bilateral 5th toes, bones removed   GANGLION CYST EXCISION  2000   3rd finger right hand   TUBAL LIGATION  1971   Past Medical History:  Diagnosis Date   Arthritis    Arthropathy, unspecified, site unspecified    Dr. Gust...right clavicular head swelling 2003   Colon polyp    colonoscopy 06-24-06 with no adenomatous change,  hyperplasia  only   CVA (cerebral vascular accident) (HCC) 03/28/2023   Environmental allergies    GERD (gastroesophageal reflux disease)    Healthcare maintenance    CPX 12-24-09, Td 1/05, pneumovax 2000 age 83, G[facility] [name] ([place])   Hyperlipidemia    target less than 160 > try off muscle aches 03-18-10   Morbid obesity (  HCC)    ideal = 142.  target wt = 158 for BMI < 30   Osteopenia    BMD 08-07-08 tspin - 1.2, left femur - .4, right femur +.2.  repeat 01-02-10  1.9    0.1   0.5   Rhinitis, chronic    Dr. Tillie ...on immunotherapy since 5/08   BP 119/62 (BP Location: Left Arm, Patient Position: Sitting, Cuff Size: Normal) Comment: Second Bp reading  Pulse 70   Ht 5' 2 (1.575 m)   Wt 143 lb 6.4 oz (65 kg)   SpO2 99%   BMI 26.23 kg/m   Opioid Risk Score:   Fall Risk Score:  `1  Depression screen [facility] 2/9     02/24/2024    3:16 PM 02/13/2024   10:40 AM 11/11/2023   10:32 AM 11/11/2023   10:31 AM 08/22/2023   11:01 AM 07/25/2023   10:42 AM 04/18/2023   11:30 AM  Depression screen PHQ 2/9  Decreased Interest 0 0 0 0 0 0 0  Down, Depressed, Hopeless 0 0 0 0 0 0 0  PHQ - 2 Score 0 0 0 0 0 0 0  Altered sleeping 0 0 0    0  Tired, decreased energy 0 1 0    1  Change in appetite 0 2 0    1  Feeling bad or failure about yourself  0 0 0    0  Trouble concentrating 0 0 0    0  Moving slowly or fidgety/restless 0 3 0    1  Suicidal thoughts 0 0     0  PHQ-9 Score 0 6 0    3  Difficult doing work/chores Not difficult at all Not difficult at all     Somewhat difficult      Review of Systems  Musculoskeletal:        Right shoulder pain Right knee pain  All other systems reviewed and are negative.      Objective:   Physical Exam    03/26/2024    9:34 AM 03/26/2024    9:08 AM 02/24/2024    3:15 PM  Vitals with BMI  Height  5' 2 5' 2  Weight  143 lbs 6 oz 143 lbs 10 oz  BMI  26.22 26.26  Systolic 119 134 867  Diastolic 62 71 61  Pulse  70 77    Gen: no  distress, normal appearing HEENT: oral mucosa pink and moist, NCAT Chest: normal effort, normal rate of breathing Abd: soft, non-distended Psych: pleasant, normal affect, very pleasant  Skin: intact, no rashes or signs of infection noted in lower extremities Neuro: Alert and oriented, normal speech and language.  Fair insight and awareness.  No dysarthria or aphasia noted. Strength 5 out of 5 left upper extremity Strength 4 out of 5 right upper extremity Strength bilateral lower extremities 4+ out of 5 Sensation intact to light touch in all 4 extremities. Musculoskeletal:  Walks with a cane Right knee joint line tenderness Arthritic changes bilateral hands L spine TTP b/l  Mild TTP left SI joint   Prior exam Facet loading positive pain with extension L spine   R knee xray 07/24/24  FINDINGS: No evidence of fracture, dislocation, or joint effusion. There is tricompartmental joint space narrowing, sclerosis and osteophytes consistent with degenerative joint disease. No evidence of effusion.   IMPRESSION: Degenerative changes.      Assessment & Plan:    1) Left frontal and parietal lobe  infarcts small vessel disease vs cardio-embolic due to A-fib.   -Continue follow-up with neurology as directed. She is currently on ASA.    2) Right knee pain likely due to OA/right upper extremity pain.   -Continue Voltaren  gel  -She also has R ankle pain, order R knee and R ankle xray studies   -Reports benefit with R knee injection prior visit  -Repeat R knee injection today  3) Mood disorder, denies SI or HI  -Previously on Remeron  at bedtime by neurology.    4) Lower extremity edema.  -Discussed use of compression stockings, leg elevation above the heart when in bed, salt moderation prior visit  5) Chronic lower back pain  -Xray L spine ordered -results pending  -Robaxin  500mg  Q8h PRN   -She has a TENS unit- advised to try for her back     R knee injection      Indication:Knee pain not relieved by medication management and other conservative care.   Informed consent was obtained after describing risks and benefits of the procedure with the patient, this includes bleeding, bruising, infection and medication side effects. The patient wishes to proceed and has given written consent. The patient was placed in a recumbent position. The Lateral aspect of the knee was marked and prepped with Betadine and alcohol . It was then entered with a 25-gauge 1-1/2 inch needle.  After negative draw back for blood, a solution containing one ML of 40mg  per mL kenalog  and 1 mL of 1% lidocaine  were injected. The patient tolerated the procedure well. Post procedure instructions were given.

## 2024-05-09 ENCOUNTER — Other Ambulatory Visit: Payer: Self-pay | Admitting: Nurse Practitioner

## 2024-05-09 MED ORDER — ROSUVASTATIN CALCIUM 20 MG PO TABS: 20.0000 mg | ORAL_TABLET | Freq: Every day | ORAL | 1 refills | Status: DC

## 2024-05-09 NOTE — Telephone Encounter (Signed)
 Copied from CRM #[id number]. Topic: Clinical - Medication Refill >> May 09, 2024 12:37 PM Miquel SAILOR wrote: Medication: rosuvastatin  (CRESTOR ) 20 MG tablet   Has the patient contacted their pharmacy? Yes (Agent: If no, request that the patient contact the pharmacy for the refill. If patient does not wish to contact the pharmacy document the reason why and proceed with request.) (Agent: If yes, when and what did the pharmacy advise?)  This is the patient's preferred pharmacy:  [id number] - [place], [place] - [address] [place] [address] Phone: [phone] Fax: [phone]  Is this the correct pharmacy for this prescription? Yes If no, delete pharmacy and type the correct one.   Has the prescription been filled recently? Yes  Is the patient out of the medication? Yes  Has the patient been seen for an appointment in the last year OR does the patient have an upcoming appointment? Yes  Can we respond through MyChart? Yes  Agent: Please be advised that Rx refills may take up to 3 business days. We ask that you follow-up with your pharmacy.

## 2024-05-18 ENCOUNTER — Ambulatory Visit: Admitting: Nurse Practitioner

## 2024-05-18 ENCOUNTER — Encounter: Payer: Self-pay | Admitting: Nurse Practitioner

## 2024-05-18 VITALS — BP 110/70 | HR 57 | Temp 97.2°F | Resp 16 | Ht 62.0 in | Wt 147.8 lb

## 2024-05-18 DIAGNOSIS — I6990 Unspecified sequelae of unspecified cerebrovascular disease: Secondary | ICD-10-CM | POA: Diagnosis not present

## 2024-05-18 DIAGNOSIS — M545 Low back pain, unspecified: Secondary | ICD-10-CM | POA: Diagnosis not present

## 2024-05-18 DIAGNOSIS — R739 Hyperglycemia, unspecified: Secondary | ICD-10-CM

## 2024-05-18 DIAGNOSIS — M1711 Unilateral primary osteoarthritis, right knee: Secondary | ICD-10-CM

## 2024-05-18 DIAGNOSIS — Z23 Encounter for immunization: Secondary | ICD-10-CM

## 2024-05-18 DIAGNOSIS — G8929 Other chronic pain: Secondary | ICD-10-CM

## 2024-05-18 MED ORDER — COVID-19 MRNA VAC-TRIS(PFIZER) 30 MCG/0.3ML IM SUSY: 0.3000 mL | PREFILLED_SYRINGE | Freq: Once | INTRAMUSCULAR | 0 refills | Status: AC

## 2024-05-18 NOTE — Progress Notes (Signed)
 Careteam: Patient Care Team: [name], NP as PCP - General (Geriatric Medicine) Loni Soyla LABOR, MD as PCP - Cardiology (Cardiology)  PLACE OF SERVICE:  [facility]  Advanced Directive information    No Known Allergies  Chief Complaint  Patient presents with   Medical Management of Chronic Issues    3 month follow up. Discuss the need for Influenza vaccine, and Covid Booster.    HPI:  Discussed the use of AI scribe software for clinical note transcription with the patient, who gave verbal consent to proceed.  History of Present Illness [name] is a 89 year old female with a history of stroke who presents for a routine follow-up.  She continues to experience knee pain despite physical and occupational therapy. The exercises from physical therapy have been beneficial, and she has received apparatuses from occupational therapy to strengthen her hand. She uses Voltaren  gel for knee pain, which provides inconsistent relief. She reports she has not seen her orthopedic specialist for her knee lately.  She has been experiencing back pain and has undergone x-rays as recommended by her rehabilitation doctor. A follow-up appointment is scheduled for October 7th.  She has gained some weight following previous weight loss. Her cholesterol was slightly above goal when checked in March, and it will be re-evaluated today. She is on cholesterol medication and has a history of stroke.  No constipation and she is doing well on Miralax . She experiences occasional LE ache and swelling, for which she uses compression hose in the winter and elevates her legs.  She discusses her driving capabilities, expressing confidence despite her family's concerns. She has not driven in almost two years but has practiced driving with her grandson.   Review of Systems:  Review of Systems  Constitutional:  Negative for chills, fever and weight loss.  HENT:  Negative for tinnitus.    Respiratory:  Negative for cough, sputum production and shortness of breath.   Cardiovascular:  Negative for chest pain, palpitations and leg swelling.  Gastrointestinal:  Negative for abdominal pain, constipation, diarrhea and heartburn.  Genitourinary:  Negative for dysuria, frequency and urgency.  Musculoskeletal:  Positive for joint pain. Negative for back pain, falls and myalgias.  Skin: Negative.   Neurological:  Negative for dizziness and headaches.  Psychiatric/Behavioral:  Negative for depression and memory loss. The patient does not have insomnia.     Past Medical History:  Diagnosis Date   Arthritis    Arthropathy, unspecified, site unspecified    Dr. Gust...right clavicular head swelling 2003   Colon polyp    colonoscopy 06-24-06 with no adenomatous change, hyperplasia  only   CVA (cerebral vascular accident) (HCC) 03/28/2023   Environmental allergies    GERD (gastroesophageal reflux disease)    Healthcare maintenance    CPX 12-24-09, Td 1/05, pneumovax 2000 age 57, [name] ([place])   Hyperlipidemia    target less than 160 > try off muscle aches 03-18-10   Morbid obesity (HCC)    ideal = 142.  target wt = 158 for BMI < 30   Osteopenia    BMD 08-07-08 tspin - 1.2, left femur - .4, right femur +.2.  repeat 01-02-10  1.9    0.1   0.5   Rhinitis, chronic    Dr. Haven ...on immunotherapy since 5/08   Past Surgical History:  Procedure Laterality Date   BUNIONECTOMY  1982   bilateral   CHOLECYSTECTOMY N/A 07/17/2020   Procedure: LAPAROSCOPIC CHOLECYSTECTOMY;  Surgeon:  Rubin Calamity, MD;  Location: [facility] OR;  Service: General;  Laterality: N/A;   FOOT SURGERY  1970   bilateral 5th toes, bones removed   GANGLION CYST EXCISION  2000   3rd finger right hand   TUBAL LIGATION  1971   Social History:   reports that she quit smoking about 68 years ago. Her smoking use included cigarettes. She started smoking about 73 years ago. She has a 2.5 pack-year smoking  history. She has never used smokeless tobacco. She reports that she does not drink alcohol  and does not use drugs.  Family History  Problem Relation Age of Onset   Colon cancer Mother        (in computer for recall colonoscopy 10/12)   Healthy Daughter        x3   Healthy Son     Medications: Patient's Medications  New Prescriptions   COVID-19 MRNA VACCINE, PFIZER, (COMIRNATY) SYRINGE    Inject 0.3 mLs into the muscle once for 1 dose. Covid/Moderna vaccine substitute for house stock  Previous Medications   ACETAMINOPHEN  (TYLENOL ) 325 MG TABLET    Take 1-2 tablets (325-650 mg total) by mouth every 4 (four) hours as needed for mild pain.   ASPIRIN  EC 81 MG TABLET    Take 81 mg by mouth daily. Swallow whole.   CARBOXYMETHYLCELLUL-GLYCERIN (LUBRICATING EYE DROPS OP)    Place 1 drop into both eyes daily as needed (dry eyes).   CETIRIZINE  (ZYRTEC ) 10 MG TABLET    Take 10 mg by mouth at bedtime as needed for allergies (drippy nose, drainage, sneezing).   CHOLECALCIFEROL  (VITAMIN D3) 25 MCG (1000 UT) CAPSULE    Take 1,000 Units by mouth daily.   CYANOCOBALAMIN  (B-12 PO)    Take 1 tablet by mouth daily.   DICLOFENAC  SODIUM (VOLTAREN ) 1 % GEL    Apply 2-4 g topically 4 (four) times daily as needed (joint pain).   FLUTICASONE  (FLONASE ) 50 MCG/ACT NASAL SPRAY    Place 1 spray into both nostrils daily as needed for allergies.   METHOCARBAMOL  (ROBAXIN ) 500 MG TABLET    TAKE 1 TABLET BY MOUTH EVERY 8 HOURS AS NEEDED FOR MUSCLE SPASMS.   POLYETHYLENE GLYCOL POWDER (GLYCOLAX /MIRALAX ) 17 GM/SCOOP POWDER    Take 17 g by mouth daily.   ROSUVASTATIN  (CRESTOR ) 20 MG TABLET    Take 1 tablet (20 mg total) by mouth daily.  Modified Medications   No medications on file  Discontinued Medications   No medications on file    Physical Exam:  Vitals:   05/18/24 1036 05/18/24 1039  BP: (!) 148/70 110/70  Pulse: (!) 57   Resp: 16   Temp: (!) 97.2 F (36.2 C)   SpO2: 99%   Weight: 147 lb 12.8 oz (67 kg)    Height: 5' 2 (1.575 m)    Body mass index is 27.03 kg/m. Wt Readings from Last 3 Encounters:  05/18/24 147 lb 12.8 oz (67 kg)  03/26/24 143 lb 6.4 oz (65 kg)  02/24/24 143 lb 9.6 oz (65.1 kg)    Physical Exam Constitutional:      General: She is not in acute distress.    Appearance: She is well-developed. She is not diaphoretic.  HENT:     Head: Normocephalic and atraumatic.     Mouth/Throat:     Pharynx: No oropharyngeal exudate.  Eyes:     Conjunctiva/sclera: Conjunctivae normal.     Pupils: Pupils are equal, round, and reactive to light.  Cardiovascular:  Rate and Rhythm: Normal rate and regular rhythm.     Heart sounds: Normal heart sounds.  Pulmonary:     Effort: Pulmonary effort is normal.     Breath sounds: Normal breath sounds.  Abdominal:     General: Bowel sounds are normal.     Palpations: Abdomen is soft.  Musculoskeletal:     Cervical back: Normal range of motion and neck supple.     Right lower leg: No edema.     Left lower leg: No edema.  Skin:    General: Skin is warm and dry.  Neurological:     Mental Status: She is alert.     Gait: Gait abnormal.  Psychiatric:        Mood and Affect: Mood normal.     Labs reviewed: Basic Metabolic Panel: Recent Labs    06/03/23 1507 07/15/23 1131 11/11/23 1137  NA  --  143 143  K  --  4.3 4.4  CL  --  108 107  CO2  --  24 27  GLUCOSE  --  89 95  BUN  --  24 19  CREATININE  --  0.80 0.96*  CALCIUM   --  10.0 10.1  MG 2.3  --   --    Liver Function Tests: Recent Labs    07/15/23 1131 11/11/23 1137  AST 18 18  ALT 16 15  BILITOT 0.4 0.7  PROT 6.7 6.9   No results for input(s): LIPASE, AMYLASE in the last 8760 hours. No results for input(s): AMMONIA in the last 8760 hours. CBC: Recent Labs    07/15/23 1131 08/22/23 1128 11/11/23 1137  WBC 4.3 4.2 4.9  NEUTROABS 2,301 2,356 2,818  HGB 10.4* 11.5* 11.8  HCT 32.3* 36.3 36.2  MCV 100.0 96.5 95.3  PLT 259 236 217   Lipid  Panel: Recent Labs    07/15/23 1131 11/11/23 1137  CHOL 207* 194  HDL 89 91  LDLCALC 97 84  TRIG 112 94  CHOLHDL 2.3 2.1   TSH: No results for input(s): TSH in the last 8760 hours. A1C: Lab Results  Component Value Date   HGBA1C 5.6 07/15/2023     Assessment/Plan  Primary osteoarthritis of right knee Assessment & Plan: Was seeing physical medicine doctor for knee but would like to follow up with orthopedics at this time.   Orders: -     CBC with Differential/Platelet -     Ambulatory referral to Orthopedics  Late effects of CVA (cerebrovascular accident) Assessment & Plan: Stable, continued  have some residual weakness but improving.  Continue exercise Continues asa and statin  Orders: -     Lipid panel -     CBC with Differential/Platelet -     Comprehensive metabolic panel with GFR  Chronic bilateral low back pain without sciatica Assessment & Plan: Stable at this time, continue with PT    Hyperglycemia Assessment & Plan: Continues with dietary modifications  Orders: -     Hemoglobin A1c  Need for COVID-19 vaccine -     COVID-19 mRNA Vac-TriS(Pfizer); Inject 0.3 mLs into the muscle once for 1 dose. Covid/Moderna vaccine substitute for house stock  Dispense: 0.3 mL; Refill: 0  Need for influenza vaccination -     Flu vaccine HIGH DOSE PF(Fluzone Trivalent)     Return in about 4 months (around 09/17/2024) for routine follow up.  Amra Shukla K. Caro BODILY [facility] [phone]

## 2024-05-19 LAB — CBC WITH DIFFERENTIAL/PLATELET
Absolute Lymphocytes: 1665 {cells}/uL (ref 850–3900)
Absolute Monocytes: 383 {cells}/uL (ref 200–950)
Basophils Absolute: 18 {cells}/uL (ref 0–200)
Basophils Relative: 0.4 %
Eosinophils Absolute: 131 {cells}/uL (ref 15–500)
Eosinophils Relative: 2.9 %
HCT: 34.6 % — ABNORMAL LOW (ref 35.0–45.0)
Hemoglobin: 11.3 g/dL — ABNORMAL LOW (ref 11.7–15.5)
MCH: 31.8 pg (ref 27.0–33.0)
MCHC: 32.7 g/dL (ref 32.0–36.0)
MCV: 97.5 fL (ref 80.0–100.0)
MPV: 11.4 fL (ref 7.5–12.5)
Monocytes Relative: 8.5 %
Neutro Abs: 2304 {cells}/uL (ref 1500–7800)
Neutrophils Relative %: 51.2 %
Platelets: 213 Thousand/uL (ref 140–400)
RBC: 3.55 Million/uL — ABNORMAL LOW (ref 3.80–5.10)
RDW: 13.2 % (ref 11.0–15.0)
Total Lymphocyte: 37 %
WBC: 4.5 Thousand/uL (ref 3.8–10.8)

## 2024-05-19 LAB — COMPREHENSIVE METABOLIC PANEL WITH GFR
AG Ratio: 1.7 (calc) (ref 1.0–2.5)
ALT: 12 U/L (ref 6–29)
AST: 17 U/L (ref 10–35)
Albumin: 4.2 g/dL (ref 3.6–5.1)
Alkaline phosphatase (APISO): 55 U/L (ref 37–153)
BUN: 20 mg/dL (ref 7–25)
CO2: 27 mmol/L (ref 20–32)
Calcium: 9.9 mg/dL (ref 8.6–10.4)
Chloride: 108 mmol/L (ref 98–110)
Creat: 0.9 mg/dL (ref 0.60–0.95)
Globulin: 2.5 g/dL (ref 1.9–3.7)
Glucose, Bld: 87 mg/dL (ref 65–99)
Potassium: 4.2 mmol/L (ref 3.5–5.3)
Sodium: 142 mmol/L (ref 135–146)
Total Bilirubin: 0.6 mg/dL (ref 0.2–1.2)
Total Protein: 6.7 g/dL (ref 6.1–8.1)
eGFR: 60 mL/min/1.73m2 (ref >=60)

## 2024-05-19 LAB — HEMOGLOBIN A1C
Hgb A1c MFr Bld: 6 % — ABNORMAL HIGH (ref <5.7)
Mean Plasma Glucose: 126 mg/dL
eAG (mmol/L): 7 mmol/L

## 2024-05-19 LAB — LIPID PANEL
Cholesterol: 204 mg/dL — ABNORMAL HIGH (ref <200)
HDL: 93 mg/dL (ref >=50)
LDL Cholesterol (Calc): 95 mg/dL
Non-HDL Cholesterol (Calc): 111 mg/dL (ref <130)
Total CHOL/HDL Ratio: 2.2 (calc) (ref <5.0)
Triglycerides: 72 mg/dL (ref <150)

## 2024-05-21 ENCOUNTER — Ambulatory Visit: Payer: Self-pay | Admitting: Nurse Practitioner

## 2024-05-22 DIAGNOSIS — R739 Hyperglycemia, unspecified: Secondary | ICD-10-CM | POA: Insufficient documentation

## 2024-05-22 NOTE — Assessment & Plan Note (Signed)
 Stable, continued  have some residual weakness but improving.  Continue exercise Continues asa and statin

## 2024-05-22 NOTE — Assessment & Plan Note (Signed)
 Continues with dietary modifications

## 2024-05-22 NOTE — Assessment & Plan Note (Signed)
 Was seeing physical medicine doctor for knee but would like to follow up with orthopedics at this time.

## 2024-05-22 NOTE — Assessment & Plan Note (Signed)
 Stable at this time, continue with PT

## 2024-05-29 ENCOUNTER — Ambulatory Visit: Admitting: Physical Medicine & Rehabilitation

## 2024-05-30 ENCOUNTER — Other Ambulatory Visit: Payer: Self-pay | Admitting: Physical Medicine & Rehabilitation

## 2024-06-12 ENCOUNTER — Encounter: Payer: Self-pay | Admitting: Physical Medicine & Rehabilitation

## 2024-06-12 ENCOUNTER — Encounter: Attending: Physical Medicine & Rehabilitation | Admitting: Physical Medicine & Rehabilitation

## 2024-06-12 VITALS — BP 124/64 | HR 72 | Ht 62.0 in | Wt 137.0 lb

## 2024-06-12 DIAGNOSIS — M545 Low back pain, unspecified: Secondary | ICD-10-CM | POA: Diagnosis not present

## 2024-06-12 DIAGNOSIS — G8929 Other chronic pain: Secondary | ICD-10-CM | POA: Diagnosis not present

## 2024-06-12 DIAGNOSIS — M1711 Unilateral primary osteoarthritis, right knee: Secondary | ICD-10-CM | POA: Diagnosis present

## 2024-06-12 NOTE — Progress Notes (Signed)
 Subjective:    Patient ID: [name], female    DOB: September 14, 1930, 89 y.o.   MRN: [id number]  HPI   Hospital Discharge summary 04/05/23   Brief HPI:   [name] is a 89 y.o. female presented to [facility] health emergency department at [facility] on 03/24/2023 complaining of worsening right upper arm weakness. She had been reporting the symptoms for approximately 2 months per family report. She was recently treated for cervical spondylolisthesis and radiculopathy as well as dizziness. First son stated 11 hours prior to admission her right arm weakness worsened significantly. Stroke was activated and she was seen in consultation by Dr. Matthews. She was out of the window for tenecteplase. Aspirin  and Plavix  started. CTA was not performed as part of her code stroke because exam was not consistent with LVO. Brain revealed scattered acute to subacute patchy ischemic infarctions evolving cortical and subcortical aspect of the left frontal and parietal lobes. No hemorrhage noted. MRI of the C-spine without acute abnormality. She underwent 2D echo with ejection fraction of approximately 60 to 65%. LDL 131 and hemoglobin A1c 6.0%. She was started on heparin  subcutaneously for VTE prophylaxis. Neurology recommends a 30-day heart monitor on discharge. Her social history is significant for loss of her husband in January of this year after 60+ years of marriage and she is exhibiting significant depression. Zoloft  started. Neurology plans to continue DAPT for 3 months./OT/SLP evaluations obtained. He is tolerating regular texture diet. Currently requiring min assist for bed mobility and transfers, using quite cane for ambulation.      Hospital Course: [name] was admitted to rehab 03/28/2023 for inpatient therapies to consist of PT, ST and OT at least three hours five days a week. Past admission physiatrist, therapy team and rehab RN have worked together to provide customized collaborative  inpatient rehab. Wrist cock-up splint applied to right. Patient requested to have Zoloft  decreased to 12.5 mg as she was concerned this was causing dizziness. Zyrtec  given for allergic rhinitis with improvement. BUN has normalized and serum creatinine is in the range of 1.01-1.06. Serum glucose range is 96-149.    Blood pressures were monitored on TID basis and found to have orthostatic hypotension. TED hose and abdominal binder placed. Encouraged PO fluids.   Rehab course: During patient's stay in rehab weekly team conferences were held to monitor patient's progress, set goals and discuss barriers to discharge. At admission, patient required min with mobility, min A with basic self-care skills.   She has had improvement in activity tolerance, balance, postural control as well as ability to compensate for deficits. She has had improvement in functional use RUE/LUE  and RLE/LLE as well as improvement in awareness. Patient has met 11 of 11 long term goals due to improved activity tolerance, improved balance, increased strength, ability to compensate for deficits, improved attention, improved awareness, and improved coordination.  Patient to discharge at an ambulatory level Supervision.   Patient's care partner is independent to provide the necessary physical assistance at discharge   Interval History 05/30/23 [name] is here for follow-up after CVA and completion of treatment at [facility].  She is here with her daughter and and they are both very pleasant.  [name] had been working with outpatient physical and Occupational Therapy.  She reports she is over all making progress with her function and is doing well overall since her stroke.  She does continue to have some pain in her right arm since the  stroke.  She wears a wrist brace on her right upper extremity.  She is working with occupational therapy and different techniques to help relieve this.  She is ambulating with a rollator.  She has stopped using  her Zoloft , patient feels that she is not depressed.  Patient's daughter thinks that her mood may continue to be decreased from her prior baseline.  Patient reports that her greatest pain currently is her right knee.  She has used IcyHot, Aspercreme, Biofreeze for her knee pain.  She has also had oral steroids for this issue.  She has previously declined steroid injections.  Interval History 07/25/23 [name] is here with her daughter for right knee injection.  She has previously been diagnosed with osteoarthritis of the right knee.  She has had oral prednisone  but she has been hesitant to do a knee injection this in the past because she has been concerned that will be too painful.  Today she is agreeable to try a knee injection.  She continues to have right knee pain worsened with activity.  She has also been having some pain at her right ankle, also worsened with activity.  Both these areas have been a little bit more swollen the past few days.  She does have chronic dependent edema in her legs.  She reports her mood is doing okay.  She was seen by neurology and cardiology in early October.  Interval History 02/24/24 Patient is here with her daughter for follow-up.  Reports knee injection was helpful prior visit, however with time and she would like repeat injection.  She is also been having back pain.  Back pain has been chronic but worsened over the past 2 months.  She is currently working with PT and OT for balance ordered by her PCP.  Her ankle pain has resolved.  Interval History 03/26/2024 Patient is here for right knee injection, reports this helped previously when completed 07/25/2023 but has worn off.  She continues to have lower back pain, recently had x-ray L-spine completed and results are pending.  She reports mild benefit with Robaxin .  She also reports she has a TENS unit.  Interval History 06/12/24  The left knee is the predominant source of pain. Reports a sensation of bones rubbing  and aching pain upon standing. The previous injection on 03/26/2024 provided mild relief for approximately six weeks but has since worn off. Manages symptoms with occasional Tylenol  and topical analgesics including Icy Hot and Voltaren  gel, which provides some relief, particularly at night.  Reports improvement in back pain. Pain is sometimes located at the base of the spine and can radiate. Pain is described as intermittent but can be intense. Reports relief with a heating pad.  Lumbar spine X-ray findings were reviewed. - Facet arthropathy noted in the lower lumbar spine, likely contributor to the low back pain.   Pain Inventory Average Pain 5 Pain Right Now 5 My pain is sharp, burning, and stabbing  In the last 24 hours, has pain interfered with the following? General activity 5 Relation with others 2 Enjoyment of life 0 What TIME of day is your pain at its worst? daytime and night Sleep (in general) Fair  Pain is worse with: walking, bending, standing, and some activites Pain improves with: heat/ice, therapy/exercise, medication, and injections Relief from Meds: 7  Family History  Problem Relation Age of Onset   Colon cancer Mother        (in computer for recall colonoscopy 10/12)   Healthy  Daughter        x3   Healthy Son    Social History   Socioeconomic History   Marital status: Widowed    Spouse name: Not on file   Number of children: 4   Years of education: Not on file   Highest education level: Associate degree: occupational, Scientist, product/process development, or vocational program  Occupational History   Not on file  Tobacco Use   Smoking status: Former    Current packs/day: 0.00    Average packs/day: 0.5 packs/day for 5.0 years (2.5 ttl pk-yrs)    Types: Cigarettes    Start date: 09/06/1950    Quit date: 09/07/1955    Years since quitting: 68.8   Smokeless tobacco: Never  Vaping Use   Vaping status: Never Used  Substance and Sexual Activity   Alcohol  use: No    Alcohol /week: 0.0  standard drinks of alcohol    Drug use: No   Sexual activity: Not Currently  Other Topics Concern   Not on file  Social History Narrative   Exercise - 2 times weekly   Caffeine - 2 cups daily   Negative history of passive tobacco smoke exposure   Lives with husband of 59 years in one story home.   Has 4 children.   Retired from [employer].    Social Drivers of Corporate investment banker Strain: Low Risk  (05/25/2024)   Received from [facility]   Overall Financial Resource Strain (CARDIA)    How hard is it for you to pay for the very basics like food, housing, medical care, and heating?: Not hard at all  Food Insecurity: No Food Insecurity (05/25/2024)   Received from [facility]   Hunger Vital Sign    Within the past 12 months, you worried that your food would run out before you got the money to buy more.: Never true    Within the past 12 months, the food you bought just didn't last and you didn't have money to get more.: Never true  Transportation Needs: No Transportation Needs (05/25/2024)   Received from [facility] - Transportation    In the past 12 months, has lack of transportation kept you from medical appointments or from getting medications?: No    In the past 12 months, has lack of transportation kept you from meetings, work, or from getting things needed for daily living?: No  Physical Activity: Inactive (05/14/2024)   Exercise Vital Sign    Days of Exercise per Week: 0 days    Minutes of Exercise per Session: Not on file  Stress: Stress Concern Present (05/14/2024)   [employer] of Occupational Health - Occupational Stress Questionnaire    Feeling of Stress: To some extent  Social Connections: Moderately Integrated (05/18/2024)   Social Connection and Isolation Panel    Frequency of Communication with Friends and Family: More than three times a week    Frequency of Social Gatherings with Friends and Family: More than three times a week    Attends  Religious Services: More than 4 times per year    Active Member of [employer] or Organizations: Yes    Attends Banker Meetings: 1 to 4 times per year    Marital Status: Widowed   Past Surgical History:  Procedure Laterality Date   BUNIONECTOMY  1982   bilateral   CHOLECYSTECTOMY N/A 07/17/2020   Procedure: LAPAROSCOPIC CHOLECYSTECTOMY;  Surgeon: Rubin Calamity, MD;  Location: [facility] OR;  Service: General;  Laterality: N/A;   FOOT SURGERY  1970   bilateral 5th toes, bones removed   GANGLION CYST EXCISION  2000   3rd finger right hand   TUBAL LIGATION  1971   Past Surgical History:  Procedure Laterality Date   BUNIONECTOMY  1982   bilateral   CHOLECYSTECTOMY N/A 07/17/2020   Procedure: LAPAROSCOPIC CHOLECYSTECTOMY;  Surgeon: Rubin Calamity, MD;  Location: [facility] OR;  Service: General;  Laterality: N/A;   FOOT SURGERY  1970   bilateral 5th toes, bones removed   GANGLION CYST EXCISION  2000   3rd finger right hand   TUBAL LIGATION  1971   Past Medical History:  Diagnosis Date   Arthritis    Arthropathy, unspecified, site unspecified    Dr. Gust...right clavicular head swelling 2003   Colon polyp    colonoscopy 06-24-06 with no adenomatous change, hyperplasia  only   CVA (cerebral vascular accident) (HCC) 03/28/2023   Environmental allergies    GERD (gastroesophageal reflux disease)    Healthcare maintenance    CPX 12-24-09, Td 1/05, pneumovax 2000 age 46, G[facility] [name] ([place])   Hyperlipidemia    target less than 160 > try off muscle aches 03-18-10   Morbid obesity (HCC)    ideal = 142.  target wt = 158 for BMI < 30   Osteopenia    BMD 08-07-08 tspin - 1.2, left femur - .4, right femur +.2.  repeat 01-02-10  1.9    0.1   0.5   Rhinitis, chronic    Dr. Lanna ...on immunotherapy since 5/08   BP 124/64   Pulse 72   Ht 5' 2 (1.575 m)   Wt 137 lb (62.1 kg)   SpO2 98%   BMI 25.06 kg/m   Opioid Risk Score:   Fall Risk Score:  `1  Depression screen [facility]  2/9     05/18/2024   10:36 AM 02/24/2024    3:16 PM 02/13/2024   10:40 AM 11/11/2023   10:32 AM 11/11/2023   10:31 AM 08/22/2023   11:01 AM 07/25/2023   10:42 AM  Depression screen PHQ 2/9  Decreased Interest 0 0 0 0 0 0 0  Down, Depressed, Hopeless 0 0 0 0 0 0 0  PHQ - 2 Score 0 0 0 0 0 0 0  Altered sleeping  0 0 0     Tired, decreased energy  0 1 0     Change in appetite  0 2 0     Feeling bad or failure about yourself   0 0 0     Trouble concentrating  0 0 0     Moving slowly or fidgety/restless  0 3 0     Suicidal thoughts  0 0      PHQ-9 Score  0 6 0     Difficult doing work/chores  Not difficult at all Not difficult at all          Review of Systems  Musculoskeletal:        Right shoulder pain Right knee pain  All other systems reviewed and are negative.      Objective:   Physical Exam    05/18/2024   10:39 AM 05/18/2024   10:36 AM 03/26/2024    9:34 AM  Vitals with BMI  Height  5' 2   Weight  147 lbs 13 oz   BMI  27.03   Systolic 110 148 880  Diastolic 70 70 62  Pulse  57  Gen: no distress, normal appearing HEENT: oral mucosa pink and moist, NCAT Chest: normal effort, normal rate of breathing Abd: soft, non-distended Psych: pleasant, normal affect, very pleasant  Skin: intact, no rashes or signs of infection noted in lower extremities Neuro: Alert and oriented, normal speech and language.  Fair insight and awareness.  No dysarthria or aphasia noted. Strength 5 out of 5 left upper extremity Strength 4 out of 5 right upper extremity Strength bilateral lower extremities 4+ out of 5 Sensation intact to light touch in all 4 extremities. Musculoskeletal:  Walks with a cane Right knee joint line tenderness and mild swelling noted  Arthritic changes bilateral hands Mild L spine TTP b/l lower L spine   Prior exam Facet loading positive pain with extension L spine   R knee xray 07/24/24  FINDINGS: No evidence of fracture, dislocation, or joint  effusion. There is tricompartmental joint space narrowing, sclerosis and osteophytes consistent with degenerative joint disease. No evidence of effusion.   IMPRESSION: Degenerative changes.  X-ray lumbar spine 03/22/2024 FINDINGS: Transitional anatomy, for the purposes of reporting, transitional vertebra will be sacralized L5. First non rib-bearing vertebra is designated L1. Grade 1 anterolisthesis L4 on L5. Trace retrolisthesis L2 on L3 and L1 on L2. Vertebral body heights are maintained. Mild diffuse disc space narrowing and small osteophytes. Prominent lower lumbar facet degenerative changes. Aortic atherosclerosis   IMPRESSION: 1. Transitional anatomy, for the purposes of reporting, transitional vertebra will be sacralized L5. 2. Multilevel degenerative changes with grade 1 anterolisthesis L4 on L5.      Assessment & Plan:    1) Left frontal and parietal lobe infarcts small vessel disease vs cardio-embolic due to A-fib.   -Continue follow-up with neurology as directed. She is currently on ASA.    2) Right knee pain likely due to OA/right upper extremity pain.   -Continue Voltaren  gel  -Discussed standard injection vs. Zilretta  (triamcinolone  acetonide extended-release), a slow-release formulation, for potentially longer-lasting relief. The plan is to proceed with a Zilretta  injection. Counseled that this is a more costly option and insurance coverage may vary. Office will check for insurance authorization. Advised they may also check with their insurance for an estimate of out-of-pocket costs.  Will schedule the injection for a date after 06/26/2024   3) Mood disorder, denies SI or HI  -Previously on Remeron  at bedtime by neurology.    4) Lower extremity edema.  -Discussed use of compression stockings, leg elevation prior to visit  5) Chronic lower back pain  -Xray L spine ordered -lower lumbar spine facet joint disease  -Robaxin  500mg  Q8h PRN   -She has a TENS unit-  advised to try for her back   -Provided a handout for a home exercise program for back and core strengthening.  Advise completing 3 times a week for up to 6 weeks if tolerated.  Patient asked about aquatic therapy.  Discussed this option with patient but it is a little far from her home.  Consider later visit.  - Consider MBB/RFA for suspected facet joint pain

## 2024-07-24 ENCOUNTER — Telehealth: Payer: Self-pay

## 2024-07-24 NOTE — Progress Notes (Signed)
 Pharmacy Quality Measure Review  This patient is appearing on a report for being at risk of failing the adherence measure for cholesterol (statin) medications this calendar year.   Medication: rosuvastatin  20 mg  Last fill date: 05/12/2024 for 90 day supply  Left voicemail for patient to return my call at their convenience. and Insurance report was not up to date. No action needed at this time.  Attempted to contact patient to determine the dose of rosuvastatin  that she is taking. According to the results follow up message from Harlene An on 05/21/24, patient should be taking 40 mg daily. Will route note to embedded pharmacist for follow up.   Woodie Jock, PharmD PGY1 Pharmacy Resident  07/24/2024

## 2024-07-25 ENCOUNTER — Telehealth: Payer: Self-pay | Admitting: Pharmacist

## 2024-07-25 DIAGNOSIS — E7800 Pure hypercholesterolemia, unspecified: Secondary | ICD-10-CM

## 2024-07-26 MED ORDER — ROSUVASTATIN CALCIUM 40 MG PO TABS: 40.0000 mg | ORAL_TABLET | Freq: Every day | ORAL | 3 refills | Status: AC

## 2024-07-26 NOTE — Progress Notes (Signed)
 The patient called back today. She reports only taking 20 mg of rosuvastatin . She didn't realize that Harlene An had increased her dose of the rosuvastatin  to 40 mg in September. Counseled on taking two 20 mg tablets at bedtime starting tonight, and once the new prescription is sent in for 40 mg, she would transition back to only taking 1 tablet at bedtime. The patient expressed understanding. Will collaborate with embedded pharmacist, Cassius, to facilitate a new prescription for rosuvastatin  40 mg to be sent to the pharmacy. The [facility]'s [facility] [address].   Woodie Jock, PharmD PGY1 Pharmacy Resident  07/26/2024

## 2024-07-26 NOTE — Progress Notes (Signed)
.    07/26/2024 Name: [name] MRN: [id number] DOB: [birth year]  Pharmacy Quality Measure Review  This patient is appearing on a report for being at risk of failing the adherence measure for cholesterol (statin) medications this calendar year.   Medication: Rosuvastatin  20 mg Last fill date: 05/12/2024 for 90 day supply  Pharmacy Resident, Woodie Jock, PharmD spoke with the Patient and reviewed the chart.  Patient's Provider recommended increasing Rosuvastatin  20 mg to Rosuvastatin  40 mg on 05/23/24 in response to the patient's lab work.   Patient's chart was reviewed again.  Will send note to Provider and send in a refill of Rosuvastatin  co-signature required to [facility].       Lipid Panel     Component Value Date/Time   CHOL 204 (H) 05/18/2024 1105   TRIG 72 05/18/2024 1105   TRIG 76 06/22/2006 1144   HDL 93 05/18/2024 1105   CHOLHDL 2.2 05/18/2024 1105   VLDL 17 03/25/2023 0309   LDLCALC 95 05/18/2024 1105   LDLDIRECT 147.8 07/04/2013 1059    [name], PharmD, BCACP Clinical Pharmacist [phone]

## 2024-07-27 ENCOUNTER — Encounter: Payer: Self-pay | Admitting: Internal Medicine

## 2024-07-27 ENCOUNTER — Ambulatory Visit: Admitting: Physical Medicine & Rehabilitation

## 2024-07-27 ENCOUNTER — Ambulatory Visit: Attending: Internal Medicine | Admitting: Internal Medicine

## 2024-07-27 VITALS — BP 131/67 | HR 70 | Resp 16 | Ht 62.0 in | Wt 142.6 lb

## 2024-07-27 DIAGNOSIS — I10 Essential (primary) hypertension: Secondary | ICD-10-CM | POA: Diagnosis not present

## 2024-07-27 DIAGNOSIS — E7800 Pure hypercholesterolemia, unspecified: Secondary | ICD-10-CM

## 2024-07-27 DIAGNOSIS — I639 Cerebral infarction, unspecified: Secondary | ICD-10-CM

## 2024-07-27 NOTE — Patient Instructions (Signed)
 Medication Instructions:  No Changes *If you need a refill on your cardiac medications before your next appointment, please call your pharmacy*  Lab Work: None  Follow-Up: At [facility], you and your health needs are our priority.  As part of our continuing mission to provide you with exceptional heart care, our providers are all part of one team.  This team includes your primary Cardiologist (physician) and Advanced Practice Providers or APPs (Physician Assistants and Nurse Practitioners) who all work together to provide you with the care you need, when you need it.  Your next appointment:   1 year(s) (We will mail a reminder letter around Sept 2026; please call for a Nov 2026 appointment)  Provider:   Gayatri A Acharya, MD   Other Instructions Please call us  or send a MyChart message with any Cardiology related questions/concerns.  [phone].  Thank you!

## 2024-07-27 NOTE — Progress Notes (Signed)
 Cardiology Office Note:  .   Date:  07/27/2024  ID:  JMYA ULIANO, DOB [birth year], MRN [id number] PCP: [name], NP  [facility] Providers Cardiologist:  Soyla DELENA Merck, MD    History of Present Illness: .   [name] is a 89 y.o. female.  Discussed the use of AI scribe software for clinical note transcription with the patient, who gave verbal consent to proceed.  History of Present Illness [name] is a 89 year old female with a history of stroke who presents for follow-up.  She has stroke involving left cortical, subcortical, frontal, and parietal regions, with risk factors of hyperlipidemia and advanced age. She has residual speech and hand coordination difficulty but no chest discomfort or unusual shortness of breath.  She takes aspirin  81 mg daily and rosuvastatin  40 mg daily, increased from 20 mg in September for elevated cholesterol, without repeat labs yet. She has noticed frothy urine since the dose change.  Her main noncardiac complaint is arthritis in multiple joints. She receives cortisone injections in her knees.  She notes intermittent leg swelling after dietary lapses at summer cookouts. She is trying to follow a heart healthy diet and reports generally good appetite with regular intake of vegetables, Raisin Bran, and almond milk.    ROS: negative except per HPI above.  Studies Reviewed: SABRA   EKG Interpretation Date/Time:  Friday July 27 2024 08:18:33 EST Ventricular Rate:  71 PR Interval:  206 QRS Duration:  84 QT Interval:  378 QTC Calculation: 410 R Axis:   13  Text Interpretation: Normal sinus rhythm Poor R wave progression Confirmed by Merck Soyla ([id number]) on 07/27/2024 8:34:50 AM    Results DIAGNOSTIC EKG: Minor change, likely artifact (07/27/2024) Echocardiogram: Normal (November 2024) Risk Assessment/Calculations:       Physical Exam:   VS:  BP 131/67 (BP Location: Left Arm, Patient Position:  Sitting, Cuff Size: Normal)   Pulse 70   Resp 16   Ht 5' 2 (1.575 m)   Wt 142 lb 9.6 oz (64.7 kg)   SpO2 96%   BMI 26.08 kg/m    Wt Readings from Last 3 Encounters:  07/27/24 142 lb 9.6 oz (64.7 kg)  06/12/24 137 lb (62.1 kg)  05/18/24 147 lb 12.8 oz (67 kg)     Physical Exam GENERAL: Alert, cooperative, well developed, no acute distress. HEENT: Normocephalic, normal oropharynx, moist mucous membranes. NECK: Neck veins normal. CHEST: Clear to auscultation bilaterally, no wheezes, rhonchi, or crackles. CARDIOVASCULAR: Normal heart rate and rhythm, S1 and S2 normal without murmurs. ABDOMEN: Soft, non-tender, non-distended, without organomegaly, normal bowel sounds. EXTREMITIES: No cyanosis or edema. NEUROLOGICAL: Cranial nerves grossly intact, moves all extremities without gross motor or sensory deficit.   ASSESSMENT AND PLAN: .    Assessment and Plan Assessment & Plan History of left frontal and parietal cerebral infarction (ischemic stroke) Ischemic stroke in the left frontal and parietal regions with residual mild speech and hand coordination issues. No new neurological symptoms reported. - Continue aspirin  81 mg daily. - Encouraged heart-healthy diet with occasional indulgences. - Advised to monitor for new neurological symptoms and report if she occurs.  Hyperlipidemia in the context of secondary stroke prevention Hyperlipidemia managed with rosuvastatin  40 mg daily. LDL cholesterol slightly elevated. Discussed efficacy of rosuvastatin  at 40 mg given age-related kidney function changes. Potential side effect of frothy urine noted, possibly related to proteinuria. Emphasized maintaining LDL cholesterol close to 70 to prevent plaque progression. - Continue  rosuvastatin  40 mg daily. - Discuss with primary care provider about potential urine test for proteinuria. - Review lab results after upcoming wellness visit to assess LDL cholesterol levels. - Consider reducing  rosuvastatin  to 20 mg if LDL cholesterol levels do not improve significantly given possible lack of additional potency with increasing doses of crestor  and reducing GFR with age.   Recording duration: 20 minutes      Oaklyn Mans, MD, FACC

## 2024-08-10 ENCOUNTER — Encounter: Admitting: Physical Medicine & Rehabilitation

## 2024-08-16 ENCOUNTER — Encounter: Payer: Self-pay | Admitting: Physical Medicine & Rehabilitation

## 2024-08-16 ENCOUNTER — Encounter: Attending: Physical Medicine & Rehabilitation | Admitting: Physical Medicine & Rehabilitation

## 2024-08-16 VITALS — BP 115/73 | HR 54 | Ht 62.0 in | Wt 142.0 lb

## 2024-08-16 DIAGNOSIS — M545 Low back pain, unspecified: Secondary | ICD-10-CM | POA: Diagnosis not present

## 2024-08-16 DIAGNOSIS — M1711 Unilateral primary osteoarthritis, right knee: Secondary | ICD-10-CM | POA: Insufficient documentation

## 2024-08-16 DIAGNOSIS — G8929 Other chronic pain: Secondary | ICD-10-CM | POA: Diagnosis not present

## 2024-08-16 MED ORDER — TRIAMCINOLONE ACETONIDE 32 MG IX SRER
32.0000 mg | Freq: Once | INTRA_ARTICULAR | Status: AC
  Administered 2024-08-16: 32 mg via INTRA_ARTICULAR

## 2024-08-16 NOTE — Progress Notes (Signed)
 Subjective:    Patient ID: [name], female    DOB: 10-Jun-1931, 89 y.o.   MRN: [id number]  HPI   Hospital Discharge summary 04/05/23   Brief HPI:   [name] is a 89 y.o. female presented to [facility] health emergency department at [facility] on 03/24/2023 complaining of worsening right upper arm weakness. She had been reporting the symptoms for approximately 2 months per family report. She was recently treated for cervical spondylolisthesis and radiculopathy as well as dizziness. First son stated 11 hours prior to admission her right arm weakness worsened significantly. Stroke was activated and she was seen in consultation by Dr. Matthews. She was out of the window for tenecteplase. Aspirin  and Plavix  started. CTA was not performed as part of her code stroke because exam was not consistent with LVO. Brain revealed scattered acute to subacute patchy ischemic infarctions evolving cortical and subcortical aspect of the left frontal and parietal lobes. No hemorrhage noted. MRI of the C-spine without acute abnormality. She underwent 2D echo with ejection fraction of approximately 60 to 65%. LDL 131 and hemoglobin A1c 6.0%. She was started on heparin  subcutaneously for VTE prophylaxis. Neurology recommends a 30-day heart monitor on discharge. Her social history is significant for loss of her husband in January of this year after 60+ years of marriage and she is exhibiting significant depression. Zoloft  started. Neurology plans to continue DAPT for 3 months./OT/SLP evaluations obtained. He is tolerating regular texture diet. Currently requiring min assist for bed mobility and transfers, using quite cane for ambulation.      Hospital Course: [name] was admitted to rehab 03/28/2023 for inpatient therapies to consist of PT, ST and OT at least three hours five days a week. Past admission physiatrist, therapy team and rehab RN have worked together to provide customized collaborative  inpatient rehab. Wrist cock-up splint applied to right. Patient requested to have Zoloft  decreased to 12.5 mg as she was concerned this was causing dizziness. Zyrtec  given for allergic rhinitis with improvement. BUN has normalized and serum creatinine is in the range of 1.01-1.06. Serum glucose range is 96-149.    Blood pressures were monitored on TID basis and found to have orthostatic hypotension. TED hose and abdominal binder placed. Encouraged PO fluids.   Rehab course: During patient's stay in rehab weekly team conferences were held to monitor patient's progress, set goals and discuss barriers to discharge. At admission, patient required min with mobility, min A with basic self-care skills.   She has had improvement in activity tolerance, balance, postural control as well as ability to compensate for deficits. She has had improvement in functional use RUE/LUE  and RLE/LLE as well as improvement in awareness. Patient has met 11 of 11 long term goals due to improved activity tolerance, improved balance, increased strength, ability to compensate for deficits, improved attention, improved awareness, and improved coordination.  Patient to discharge at an ambulatory level Supervision.   Patient's care partner is independent to provide the necessary physical assistance at discharge   Interval History 05/30/23 [name] is here for follow-up after CVA and completion of treatment at [facility].  She is here with her daughter and and they are both very pleasant.  [name] had been working with outpatient physical and Occupational Therapy.  She reports she is over all making progress with her function and is doing well overall since her stroke.  She does continue to have some pain in her right arm since the  stroke.  She wears a wrist brace on her right upper extremity.  She is working with occupational therapy and different techniques to help relieve this.  She is ambulating with a rollator.  She has stopped using  her Zoloft , patient feels that she is not depressed.  Patient's daughter thinks that her mood may continue to be decreased from her prior baseline.  Patient reports that her greatest pain currently is her right knee.  She has used IcyHot, Aspercreme, Biofreeze for her knee pain.  She has also had oral steroids for this issue.  She has previously declined steroid injections.  Interval History 07/25/23 [name] is here with her daughter for right knee injection.  She has previously been diagnosed with osteoarthritis of the right knee.  She has had oral prednisone  but she has been hesitant to do a knee injection this in the past because she has been concerned that will be too painful.  Today she is agreeable to try a knee injection.  She continues to have right knee pain worsened with activity.  She has also been having some pain at her right ankle, also worsened with activity.  Both these areas have been a little bit more swollen the past few days.  She does have chronic dependent edema in her legs.  She reports her mood is doing okay.  She was seen by neurology and cardiology in early October.  Interval History 02/24/24 Patient is here with her daughter for follow-up.  Reports knee injection was helpful prior visit, however with time and she would like repeat injection.  She is also been having back pain.  Back pain has been chronic but worsened over the past 2 months.  She is currently working with PT and OT for balance ordered by her PCP.  Her ankle pain has resolved.  Interval History 03/26/2024 Patient is here for right knee injection, reports this helped previously when completed 07/25/2023 but has worn off.  She continues to have lower back pain, recently had x-ray L-spine completed and results are pending.  She reports mild benefit with Robaxin .  She also reports she has a TENS unit.  Interval History 06/12/24  The left knee is the predominant source of pain. Reports a sensation of bones rubbing  and aching pain upon standing. The previous injection on 03/26/2024 provided mild relief for approximately six weeks but has since worn off. Manages symptoms with occasional Tylenol  and topical analgesics including Icy Hot and Voltaren  gel, which provides some relief, particularly at night.  Reports improvement in back pain. Pain is sometimes located at the base of the spine and can radiate. Pain is described as intermittent but can be intense. Reports relief with a heating pad.  Lumbar spine X-ray findings were reviewed. - Facet arthropathy noted in the lower lumbar spine, likely contributor to the low back pain.   Interval History 08/16/24 The patient presents for a right knee injection due to worsening pain over the last 3-4 months. The last injection was approximately 6 months ago and provided relief for about 6 weeks. The patient reports increased walking and travel recently. The back is doing well. The left knee is also starting to bother the patient, but it is bearable and not as severe as the right knee. The patient uses topical rubs and has a TENS unit for pain management.  Pain Inventory Average Pain 5 Pain Right Now 5 My pain is sharp, burning, and stabbing  In the last 24 hours, has pain interfered  with the following? General activity 5 Relation with others 2 Enjoyment of life 0 What TIME of day is your pain at its worst? daytime and night Sleep (in general) Fair  Pain is worse with: walking, bending, standing, and some activites Pain improves with: heat/ice, therapy/exercise, medication, and injections Relief from Meds: 7  Family History  Problem Relation Age of Onset   Colon cancer Mother        (in computer for recall colonoscopy 10/12)   Healthy Daughter        x3   Healthy Son    Social History   Socioeconomic History   Marital status: Widowed    Spouse name: Not on file   Number of children: 4   Years of education: Not on file   Highest education level:  Associate degree: occupational, scientist, product/process development, or vocational program  Occupational History   Not on file  Tobacco Use   Smoking status: Former    Current packs/day: 0.00    Average packs/day: 0.5 packs/day for 5.0 years (2.5 ttl pk-yrs)    Types: Cigarettes    Start date: 09/06/1950    Quit date: 09/07/1955    Years since quitting: 68.9   Smokeless tobacco: Never  Vaping Use   Vaping status: Never Used  Substance and Sexual Activity   Alcohol  use: No    Alcohol /week: 0.0 standard drinks of alcohol    Drug use: No   Sexual activity: Not Currently  Other Topics Concern   Not on file  Social History Narrative   Exercise - 2 times weekly   Caffeine - 2 cups daily   Negative history of passive tobacco smoke exposure   Lives with husband of 59 years in one story home.   Has 4 children.   Retired from [employer].    Social Drivers of Health   Tobacco Use: Medium Risk (08/16/2024)   Patient History    Smoking Tobacco Use: Former    Smokeless Tobacco Use: Never    Passive Exposure: Not on file  Financial Resource Strain: Low Risk (05/25/2024)   Received from [facility]   Overall Financial Resource Strain (CARDIA)    How hard is it for you to pay for the very basics like food, housing, medical care, and heating?: Not hard at all  Food Insecurity: No Food Insecurity (05/25/2024)   Received from [facility]   Epic    Within the past 12 months, you worried that your food would run out before you got the money to buy more.: Never true    Within the past 12 months, the food you bought just didn't last and you didn't have money to get more.: Never true  Transportation Needs: No Transportation Needs (05/25/2024)   Received from [facility]    In the past 12 months, has lack of transportation kept you from medical appointments or from getting medications?: No    In the past 12 months, has lack of transportation kept you from meetings, work, or from getting things needed for daily  living?: No  Physical Activity: Inactive (05/14/2024)   Exercise Vital Sign    Days of Exercise per Week: 0 days    Minutes of Exercise per Session: Not on file  Stress: Stress Concern Present (05/14/2024)   [employer] of Occupational Health - Occupational Stress Questionnaire    Feeling of Stress: To some extent  Social Connections: Moderately Integrated (05/18/2024)   Social Connection and Isolation Panel    Frequency  of Communication with Friends and Family: More than three times a week    Frequency of Social Gatherings with Friends and Family: More than three times a week    Attends Religious Services: More than 4 times per year    Active Member of [employer] or Organizations: Yes    Attends Banker Meetings: 1 to 4 times per year    Marital Status: Widowed  Depression (PHQ2-9): Low Risk (05/18/2024)   Depression (PHQ2-9)    PHQ-2 Score: 0  Alcohol  Screen: Not on file  Housing: Low Risk (05/25/2024)   Received from [facility]    In the last 12 months, was there a time when you were not able to pay the mortgage or rent on time?: No    In the past 12 months, how many times have you moved where you were living?: 0    At any time in the past 12 months, were you homeless or living in a shelter (including now)?: No  Utilities: Not At Risk (05/25/2024)   Received from [facility]    In the past 12 months has the electric, gas, oil, or water company threatened to shut off services in your home?: No  Health Literacy: Not on file   Past Surgical History:  Procedure Laterality Date   BUNIONECTOMY  1982   bilateral   CHOLECYSTECTOMY N/A 07/17/2020   Procedure: LAPAROSCOPIC CHOLECYSTECTOMY;  Surgeon: Rubin Calamity, MD;  Location: [facility] OR;  Service: General;  Laterality: N/A;   FOOT SURGERY  1970   bilateral 5th toes, bones removed   GANGLION CYST EXCISION  2000   3rd finger right hand   TUBAL LIGATION  1971   Past Surgical History:  Procedure Laterality  Date   BUNIONECTOMY  1982   bilateral   CHOLECYSTECTOMY N/A 07/17/2020   Procedure: LAPAROSCOPIC CHOLECYSTECTOMY;  Surgeon: Rubin Calamity, MD;  Location: [facility] OR;  Service: General;  Laterality: N/A;   FOOT SURGERY  1970   bilateral 5th toes, bones removed   GANGLION CYST EXCISION  2000   3rd finger right hand   TUBAL LIGATION  1971   Past Medical History:  Diagnosis Date   Arthritis    Arthropathy, unspecified, site unspecified    Dr. Gust...right clavicular head swelling 2003   Colon polyp    colonoscopy 06-24-06 with no adenomatous change, hyperplasia  only   CVA (cerebral vascular accident) (HCC) 03/28/2023   Environmental allergies    GERD (gastroesophageal reflux disease)    Healthcare maintenance    CPX 12-24-09, Td 1/05, pneumovax 2000 age 3, G[facility] [name] ([place])   Hyperlipidemia    target less than 160 > try off muscle aches 03-18-10   Morbid obesity (HCC)    ideal = 142.  target wt = 158 for BMI < 30   Osteopenia    BMD 08-07-08 tspin - 1.2, left femur - .4, right femur +.2.  repeat 01-02-10  1.9    0.1   0.5   Rhinitis, chronic    Dr. Grady ...on immunotherapy since 5/08   BP 115/73 (BP Location: Left Arm, Patient Position: Sitting, Cuff Size: Normal)   Pulse (!) 54   Ht 5' 2 (1.575 m)   Wt 142 lb (64.4 kg)   SpO2 96%   BMI 25.97 kg/m   Opioid Risk Score:   Fall Risk Score:  `1  Depression screen [facility] 2/9     05/18/2024   10:36 AM 02/24/2024  3:16 PM 02/13/2024   10:40 AM 11/11/2023   10:32 AM 11/11/2023   10:31 AM 08/22/2023   11:01 AM 07/25/2023   10:42 AM  Depression screen PHQ 2/9  Decreased Interest 0 0 0 0 0 0 0  Down, Depressed, Hopeless 0 0 0 0 0 0 0  PHQ - 2 Score 0 0 0 0 0 0 0  Altered sleeping  0 0 0     Tired, decreased energy  0 1 0     Change in appetite  0 2 0     Feeling bad or failure about yourself   0 0 0     Trouble concentrating  0 0 0     Moving slowly or fidgety/restless  0 3 0     Suicidal thoughts  0 0      PHQ-9  Score  0  6  0      Difficult doing work/chores  Not difficult at all Not difficult at all         Data saved with a previous flowsheet row definition      Review of Systems  Musculoskeletal:        Right shoulder pain Right knee pain  All other systems reviewed and are negative.      Objective:   Physical Exam    08/16/2024   10:17 AM 07/27/2024    8:21 AM 06/12/2024   10:53 AM  Vitals with BMI  Height 5' 2 5' 2 5' 2  Weight 142 lbs 142 lbs 10 oz 137 lbs  BMI 25.97 26.08 25.05  Systolic 115 131 875  Diastolic 73 67 64  Pulse 54 70 72    Gen: no distress, normal appearing HEENT: oral mucosa pink and moist, NCAT Chest: normal effort, normal rate of breathing Abd: soft, non-distended Psych: pleasant, normal affect, very pleasant  Skin: intact, no rashes or signs of infection noted in lower extremities Neuro: Alert and oriented, normal speech and language.  Fair insight and awareness.  No dysarthria or aphasia noted. Strength 5 out of 5 left upper extremity Strength 4 out of 5 right upper extremity Strength bilateral lower extremities 4+ out of 5 Sensation intact to light touch in all 4 extremities. Musculoskeletal:  Walks with a cane Right knee joint line tenderness, no redness or signs of infection Arthritic changes bilateral hands No significant L spine tenderness  Prior exam Facet loading positive pain with extension L spine   R knee xray 07/24/24  FINDINGS: No evidence of fracture, dislocation, or joint effusion. There is tricompartmental joint space narrowing, sclerosis and osteophytes consistent with degenerative joint disease. No evidence of effusion.   IMPRESSION: Degenerative changes.  X-ray lumbar spine 03/22/2024 FINDINGS: Transitional anatomy, for the purposes of reporting, transitional vertebra will be sacralized L5. First non rib-bearing vertebra is designated L1. Grade 1 anterolisthesis L4 on L5. Trace retrolisthesis L2 on L3 and L1 on  L2. Vertebral body heights are maintained. Mild diffuse disc space narrowing and small osteophytes. Prominent lower lumbar facet degenerative changes. Aortic atherosclerosis   IMPRESSION: 1. Transitional anatomy, for the purposes of reporting, transitional vertebra will be sacralized L5. 2. Multilevel degenerative changes with grade 1 anterolisthesis L4 on L5.      Assessment & Plan:    1) Left frontal and parietal lobe infarcts small vessel disease vs cardio-embolic due to A-fib.   -Continue follow-up with neurology as directed. She is currently on ASA.    2) Right knee pain likely due  to OA/right upper extremity pain.   -Continue Voltaren  gel  R knee injection   Indication:Knee pain not relieved by medication management and other conservative care.  Informed consent was obtained after describing risks and benefits of the procedure with the patient, this includes bleeding, bruising, infection and medication side effects. The patient wishes to proceed and has given written consent. The patient was placed in a recumbent position. The Lateral aspect of the knee was marked and prepped with Betadine and alcohol . It was then entered with a 21-gauge 1-1/2 inch needle.  After negative draw back for blood, a solution containing zilretta  (32 mg of triamcinolone  acetonide/5 mL solution) was injected. The patient tolerated the procedure well. Post procedure instructions were given.      3) Mood disorder, denies SI or HI  -Previously on Remeron  at bedtime by neurology.    4) Lower extremity edema.  -Discussed use of compression stockings, leg elevation prior to visit  5) Chronic lower back pain  -Xray L spine ordered -lower lumbar spine facet joint disease  -Robaxin  500mg  Q8h PRN   -She has a TENS unit- advised to try for her back   -Prior visit Provided a handout for a home exercise program for back and core strengthening.  Advise completing 3 times a week for up to 6 weeks if tolerated.   Patient asked about aquatic therapy.  Discussed this option with patient but it is a little far from her home.  Consider later visit.  - Consider MBB/RFA for suspected facet joint pain  -Back pain controlled, continue current regimen for now

## 2024-08-24 ENCOUNTER — Ambulatory Visit: Payer: Medicare Other | Admitting: Nurse Practitioner

## 2024-08-24 ENCOUNTER — Encounter: Payer: Self-pay | Admitting: Nurse Practitioner

## 2024-08-24 VITALS — BP 128/72 | HR 61 | Temp 97.9°F | Ht 62.0 in | Wt 140.0 lb

## 2024-08-24 DIAGNOSIS — Z0000 Encounter for general adult medical examination without abnormal findings: Secondary | ICD-10-CM | POA: Diagnosis not present

## 2024-08-24 NOTE — Progress Notes (Signed)
 "  Chief Complaint  Patient presents with   Medicare Wellness    Annual wellness visit      Subjective:   [name] is a 89 y.o. female who presents for a Medicare Annual Wellness Visit.  Visit info / Clinical Intake: Medicare Wellness Visit Type:: Subsequent Annual Wellness Visit Persons participating in visit and providing information:: patient & caregiver Medicare Wellness Visit Mode:: In-person (required for WTM) Interpreter Needed?: No Pre-visit prep was completed: yes AWV questionnaire completed by patient prior to visit?: yes Living arrangements:: with family/others Patient's Overall Health Status Rating: good Typical amount of pain: (!) a lot (Knee pain) Does pain affect daily life?: (!) yes Are you currently prescribed opioids?: no  Dietary Habits and Nutritional Risks How many meals a day?: 3 Eats fruit and vegetables daily?: yes Most meals are obtained by: preparing own meals; eating out; having others provide food In the last 2 weeks, have you had any of the following?: unintentional weight loss Diabetic:: no  Functional Status Activities of Daily Living (to include ambulation/medication): (!) Needs Assist Feeding: Independent Dressing/Grooming: Needs assistance Bathing: Needs assistance Toileting: Independent Transfer: Needs assistance Ambulation: Independent with device- listed below Home Assistive Devices/Equipment: Johna Finder (specify Type) Medication Administration: Independent Home Management (perform basic housework or laundry): Needs assistance (comment) Manage your own finances?: (!) no (Need assistance) Primary transportation is: family / friends Concerns about vision?: no *vision screening is required for WTM* Concerns about hearing?: no  Fall Screening Falls in the past year?: 1 Number of falls in past year: 0 Was there an injury with Fall?: 0 Fall Risk Category Calculator: 1 Patient Fall Risk Level: Low Fall Risk  Fall  Risk Patient at Risk for Falls Due to: No Fall Risks Fall risk Follow up: Falls evaluation completed  Home and Transportation Safety: All rugs have non-skid backing?: (!) no All stairs or steps have railings?: yes Grab bars in the bathtub or shower?: yes Have non-skid surface in bathtub or shower?: yes Good home lighting?: yes Regular seat belt use?: yes Hospital stays in the last year:: no  Cognitive Assessment Difficulty concentrating, remembering, or making decisions? : yes Will 6CIT or Mini Cog be Completed: yes What year is it?: 0 points What month is it?: 0 points Give patient an address phrase to remember (5 components): [address] [place] About what time is it?: 0 points Count backwards from 20 to 1: 0 points Say the months of the year in reverse: 0 points Repeat the address phrase from earlier: 0 points 6 CIT Score: 0 points  Advance Directives (For Healthcare) Does Patient Have a Medical Advance Directive?: Yes Does patient want to make changes to medical advance directive?: No - Guardian declined Type of Advance Directive: Healthcare Power of [place]; Out of facility DNR (pink MOST or yellow form) Copy of Healthcare Power of Attorney in Chart?: Yes - validated most recent copy scanned in chart (See row information) Out of facility DNR (pink MOST or yellow form) in Chart? (Ambulatory ONLY): Yes - validated most recent copy scanned in chart Pre-existing out of facility DNR order (yellow form or pink MOST form): Yellow form placed in chart (order not valid for inpatient use)  Reviewed/Updated  Reviewed/Updated: Reviewed All (Medical, Surgical, Family, Medications, Allergies, Care Teams, Patient Goals)    Allergies (verified) Patient has no known allergies.   Current Medications (verified) Outpatient Encounter Medications as of 08/24/2024  Medication Sig   acetaminophen  (TYLENOL ) 325 MG tablet Take 1-2 tablets (325-650  mg total) by mouth every 4 (four)  hours as needed for mild pain.   aspirin  EC 81 MG tablet Take 81 mg by mouth daily. Swallow whole.   Carboxymethylcellul-Glycerin (LUBRICATING EYE DROPS OP) Place 1 drop into both eyes daily as needed (dry eyes).   cetirizine  (ZYRTEC ) 10 MG tablet Take 10 mg by mouth at bedtime as needed for allergies (drippy nose, drainage, sneezing).   Cholecalciferol  (VITAMIN D3) 25 MCG (1000 UT) capsule Take 1,000 Units by mouth daily.   Cyanocobalamin  (B-12 PO) Take 1 tablet by mouth daily.   diclofenac  Sodium (VOLTAREN ) 1 % GEL Apply 2-4 g topically 4 (four) times daily as needed (joint pain).   fluticasone  (FLONASE ) 50 MCG/ACT nasal spray Place 1 spray into both nostrils daily as needed for allergies.   polyethylene glycol (MIRALAX  / GLYCOLAX ) 17 g packet Take 17 g by mouth daily as needed.   rosuvastatin  (CRESTOR ) 40 MG tablet Take 1 tablet (40 mg total) by mouth daily.   polyethylene glycol powder (GLYCOLAX /MIRALAX ) 17 GM/SCOOP powder Take 17 g by mouth daily. (Patient not taking: Reported on 08/24/2024)   No facility-administered encounter medications on file as of 08/24/2024.    History: Past Medical History:  Diagnosis Date   Arthritis    Arthropathy, unspecified, site unspecified    Dr. Gust...right clavicular head swelling 2003   Colon polyp    colonoscopy 06-24-06 with no adenomatous change, hyperplasia  only   CVA (cerebral vascular accident) (HCC) 03/28/2023   Environmental allergies    GERD (gastroesophageal reflux disease)    Healthcare maintenance    CPX 12-24-09, Td 1/05, pneumovax 2000 age 56, GYN [name] ([place])   Hyperlipidemia    target less than 160 > try off muscle aches 03-18-10   Morbid obesity (HCC)    ideal = 142.  target wt = 158 for BMI < 30   Osteopenia    BMD 08-07-08 tspin - 1.2, left femur - .4, right femur +.2.  repeat 01-02-10  1.9    0.1   0.5   Rhinitis, chronic    Dr. Genette ...on immunotherapy since 5/08   Past Surgical History:  Procedure  Laterality Date   BUNIONECTOMY  1982   bilateral   CHOLECYSTECTOMY N/A 07/17/2020   Procedure: LAPAROSCOPIC CHOLECYSTECTOMY;  Surgeon: Rubin Calamity, MD;  Location: [facility] OR;  Service: General;  Laterality: N/A;   FOOT SURGERY  1970   bilateral 5th toes, bones removed   GANGLION CYST EXCISION  2000   3rd finger right hand   TUBAL LIGATION  1971   Family History  Problem Relation Age of Onset   Colon cancer Mother        (in computer for recall colonoscopy 10/12)   Healthy Daughter        x3   Healthy Son    Social History   Occupational History   Not on file  Tobacco Use   Smoking status: Former    Current packs/day: 0.00    Average packs/day: 0.5 packs/day for 5.0 years (2.5 ttl pk-yrs)    Types: Cigarettes    Start date: 09/06/1950    Quit date: 09/07/1955    Years since quitting: 69.0   Smokeless tobacco: Never  Vaping Use   Vaping status: Never Used  Substance and Sexual Activity   Alcohol  use: No    Alcohol /week: 0.0 standard drinks of alcohol    Drug use: No   Sexual activity: Not Currently   Tobacco Counseling Counseling given: Not Answered  SDOH Screenings   Food Insecurity: No Food Insecurity (08/22/2024)  Housing: Low Risk (08/22/2024)  Transportation Needs: No Transportation Needs (08/22/2024)  Utilities: Not At Risk (05/25/2024)   Received from [facility]  Depression (PHQ2-9): Low Risk (08/24/2024)  Financial Resource Strain: Low Risk (08/22/2024)  Physical Activity: Inactive (08/22/2024)  Social Connections: Moderately Isolated (08/22/2024)  Stress: No Stress Concern Present (08/22/2024)  Tobacco Use: Medium Risk (08/24/2024)   See flowsheets for full screening details  Depression Screen PHQ 2 & 9 Depression Scale- Over the past 2 weeks, how often have you been bothered by any of the following problems? Little interest or pleasure in doing things: 0 Feeling down, depressed, or hopeless (PHQ Adolescent also includes...irritable): 0 PHQ-2 Total  Score: 0 Trouble falling or staying asleep, or sleeping too much: 0 Feeling tired or having little energy: 0 Poor appetite or overeating (PHQ Adolescent also includes...weight loss): 0 Feeling bad about yourself - or that you are a failure or have let yourself or your family down: 0 Trouble concentrating on things, such as reading the newspaper or watching television (PHQ Adolescent also includes...like school work): 0 Moving or speaking so slowly that other people could have noticed. Or the opposite - being so fidgety or restless that you have been moving around a lot more than usual: 0 Thoughts that you would be better off dead, or of hurting yourself in some way: 0 PHQ-9 Total Score: 0 If you checked off any problems, how difficult have these problems made it for you to do your work, take care of things at home, or get along with other people?: Not difficult at all     Goals Addressed   None          Objective:    Today's Vitals   08/24/24 1049  BP: 128/72  Pulse: 61  Temp: 97.9 F (36.6 C)  SpO2: 99%  Weight: 140 lb (63.5 kg)  Height: 5' 2 (1.575 m)   Body mass index is 25.61 kg/m.  Hearing/Vision screen Hearing Screening - Comments:: No hearing issues  Vision Screening - Comments:: Last eye exam pending for Monday 08/27/24, with Dr.Lindsey ([facility])  Immunizations and Health Maintenance Health Maintenance  Topic Date Due   COVID-19 Vaccine (8 - 2025-26 season) 11/23/2024   Medicare Annual Wellness (AWV)  08/24/2025   DTaP/Tdap/Td (4 - Td or Tdap) 07/14/2033   Pneumococcal Vaccine: 50+ Years  Completed   Influenza Vaccine  Completed   Bone Density Scan  Completed   Zoster Vaccines- Shingrix  Completed   Meningococcal B Vaccine  Aged Out        Assessment/Plan:  This is a routine wellness examination for Karisa.  Patient Care Team: [name], NP as PCP - General (Geriatric Medicine) Loni Soyla LABOR, MD as PCP - Cardiology (Cardiology)  I  have personally reviewed and noted the following in the patients chart:   Medical and social history Use of alcohol , tobacco or illicit drugs  Current medications and supplements including opioid prescriptions. Functional ability and status Nutritional status Physical activity Advanced directives List of other physicians Hospitalizations, surgeries, and ER visits in previous 12 months Vitals Screenings to include cognitive, depression, and falls Referrals and appointments  No orders of the defined types were placed in this encounter.  In addition, I have reviewed and discussed with patient certain preventive protocols, quality metrics, and best practice recommendations. A written personalized care plan for preventive services as well as general preventive health recommendations were provided to  patient.   Harlene MARLA An, NP   08/24/2024   Return in 1 year (on 08/24/2025) for AWV.  After Visit Summary: (In Person-Printed) AVS printed and given to the patient  "

## 2024-08-24 NOTE — Patient Instructions (Signed)
 [name],  Thank you for taking the time for your Medicare Wellness Visit. I appreciate your continued commitment to your health goals. Please review the care plan we discussed, and feel free to reach out if I can assist you further.  Please note that Annual Wellness Visits do not include a physical exam. Some assessments may be limited, especially if the visit was conducted virtually. If needed, we may recommend an in-person follow-up with your provider.  Ongoing Care Seeing your primary care provider every 3 to 6 months helps us  monitor your health and provide consistent, personalized care.   Referrals If a referral was made during today's visit and you haven't received any updates within two weeks, please contact the referred provider directly to check on the status.  Recommended Screenings:  Health Maintenance  Topic Date Due   COVID-19 Vaccine (8 - 2025-26 season) 11/23/2024   Medicare Annual Wellness Visit  08/24/2025   DTaP/Tdap/Td vaccine (4 - Td or Tdap) 07/14/2033   Pneumococcal Vaccine for age over 67  Completed   Flu Shot  Completed   Osteoporosis screening with Bone Density Scan  Completed   Zoster (Shingles) Vaccine  Completed   Meningitis B Vaccine  Aged Out       08/24/2024   10:55 AM  Advanced Directives  Does Patient Have a Medical Advance Directive? Yes  Type of Estate Agent of [place];Out of facility DNR (pink MOST or yellow form)  Does patient want to make changes to medical advance directive? No - Guardian declined  Copy of Healthcare Power of Attorney in Chart? Yes - validated most recent copy scanned in chart (See row information)  Pre-existing out of facility DNR order (yellow form or pink MOST form) Yellow form placed in chart (order not valid for inpatient use)    Vision: Annual vision screenings are recommended for early detection of glaucoma, cataracts, and diabetic retinopathy. These exams can also reveal signs of chronic  conditions such as diabetes and high blood pressure.  Dental: Annual dental screenings help detect early signs of oral cancer, gum disease, and other conditions linked to overall health, including heart disease and diabetes.  Please see the attached documents for additional preventive care recommendations.

## 2024-09-20 ENCOUNTER — Encounter: Payer: Self-pay | Admitting: Nurse Practitioner

## 2024-09-21 ENCOUNTER — Encounter: Payer: Self-pay | Admitting: Nurse Practitioner

## 2024-09-21 ENCOUNTER — Ambulatory Visit (INDEPENDENT_AMBULATORY_CARE_PROVIDER_SITE_OTHER): Payer: Self-pay | Admitting: Nurse Practitioner

## 2024-09-21 VITALS — BP 122/84 | HR 52 | Temp 97.9°F | Ht 62.0 in | Wt 137.0 lb

## 2024-09-21 DIAGNOSIS — R82998 Other abnormal findings in urine: Secondary | ICD-10-CM

## 2024-09-21 DIAGNOSIS — J3089 Other allergic rhinitis: Secondary | ICD-10-CM | POA: Diagnosis not present

## 2024-09-21 DIAGNOSIS — R8290 Unspecified abnormal findings in urine: Secondary | ICD-10-CM

## 2024-09-21 DIAGNOSIS — N1831 Chronic kidney disease, stage 3a: Secondary | ICD-10-CM

## 2024-09-21 DIAGNOSIS — E7800 Pure hypercholesterolemia, unspecified: Secondary | ICD-10-CM

## 2024-09-21 DIAGNOSIS — R739 Hyperglycemia, unspecified: Secondary | ICD-10-CM | POA: Diagnosis not present

## 2024-09-21 DIAGNOSIS — M818 Other osteoporosis without current pathological fracture: Secondary | ICD-10-CM | POA: Diagnosis not present

## 2024-09-21 DIAGNOSIS — L84 Corns and callosities: Secondary | ICD-10-CM

## 2024-09-21 DIAGNOSIS — M1711 Unilateral primary osteoarthritis, right knee: Secondary | ICD-10-CM

## 2024-09-21 DIAGNOSIS — I6930 Unspecified sequelae of cerebral infarction: Secondary | ICD-10-CM | POA: Diagnosis not present

## 2024-09-21 LAB — POCT URINALYSIS DIPSTICK
Bilirubin, UA: NEGATIVE
Glucose, UA: NEGATIVE
Ketones, UA: NEGATIVE
Leukocytes, UA: NEGATIVE
Nitrite, UA: NEGATIVE
Protein, UA: POSITIVE — AB
Spec Grav, UA: 1.025
Urobilinogen, UA: 0.2 U/dL
pH, UA: 6

## 2024-09-21 NOTE — Progress Notes (Signed)
 "   Careteam: Patient Care Team: [name], NP as PCP - General (Geriatric Medicine) Loni Soyla LABOR, MD as PCP - Cardiology (Cardiology)  PLACE OF SERVICE:  [facility]  Advanced Directive information    Allergies[1]  Chief Complaint  Patient presents with   Medical Management of Chronic Issues    4 month follow-up. Request to examine left foot. Discuss cholesterol and reducing medication. Patient still with frothy urine and states this is a side effect of medication. Discuss need for multivitamin.     HPI:  Discussed the use of AI scribe software for clinical note transcription with the patient, who gave verbal consent to proceed.  History of Present Illness [name] is a 89 year old female who presents for follow up with concerns about frothy urine.  She has been experiencing frothy urine consistently with each bathroom visit. Additionally, she describes a 'little funny feeling' in her vaginal area. A test conducted last year by GYN showed no concerning findings. No burning or increased frequency of urination.  She has a history of stroke and is currently on Crestor  40 mg for cholesterol management and aspirin  81 mg daily. She has been making dietary modifications and is interested in checking her cholesterol levels.  She reports a callus or corn on her foot that developed suddenly about two months ago, following a mani-pedi. It is painful.  She has arthritis in her knee, which has been managed with injection, providing some relief. However, she has not been able to start physical therapy due to scheduling issues and a lack of available therapists.  She takes Zyrtec  at night and Flonase  during the day for sinus issues, but continues to experience symptoms such as sniffing and dripping throughout the day.  She has a history of osteoporosis and is taking calcium  and vitamin D  supplements.   She also has hyperglycemia and chronic kidney disease.   Review of  Systems:  Review of Systems  Constitutional:  Negative for chills, fever and weight loss.  HENT:  Negative for tinnitus.   Respiratory:  Negative for cough, sputum production and shortness of breath.   Cardiovascular:  Negative for chest pain, palpitations and leg swelling.  Gastrointestinal:  Negative for abdominal pain, constipation, diarrhea and heartburn.  Genitourinary:  Negative for dysuria, frequency and urgency.  Musculoskeletal:  Positive for joint pain. Negative for back pain, falls and myalgias.  Skin: Negative.   Neurological:  Negative for dizziness and headaches.  Endo/Heme/Allergies:  Positive for environmental allergies.  Psychiatric/Behavioral:  Negative for depression and memory loss. The patient does not have insomnia.     Past Medical History:  Diagnosis Date   Arthritis    Arthropathy, unspecified, site unspecified    Dr. Gust...right clavicular head swelling 2003   Chronic kidney disease    Colon polyp    colonoscopy 06-24-06 with no adenomatous change, hyperplasia  only   CVA (cerebral vascular accident) (HCC) 03/28/2023   Environmental allergies    GERD (gastroesophageal reflux disease)    Healthcare maintenance    CPX 12-24-09, Td 1/05, pneumovax 2000 age 4, GYN [name] ([place])   Hyperlipidemia    target less than 160 > try off muscle aches 03-18-10   Hypertension    Morbid obesity (HCC)    ideal = 142.  target wt = 158 for BMI < 30   Osteopenia    BMD 08-07-08 tspin - 1.2, left femur - .4, right femur +.2.  repeat 01-02-10  1.9  0.1   0.5   Osteoporosis    Rhinitis, chronic    Dr. Adron ...on immunotherapy since 5/08   Past Surgical History:  Procedure Laterality Date   BUNIONECTOMY  1982   bilateral   CHOLECYSTECTOMY N/A 07/17/2020   Procedure: LAPAROSCOPIC CHOLECYSTECTOMY;  Surgeon: Rubin Calamity, MD;  Location: [facility] OR;  Service: General;  Laterality: N/A;   FOOT SURGERY  1970   bilateral 5th toes, bones removed   GANGLION CYST  EXCISION  2000   3rd finger right hand   TUBAL LIGATION  1971   Social History:   reports that she quit smoking about 69 years ago. Her smoking use included cigarettes. She started smoking about 74 years ago. She has a 2.5 pack-year smoking history. She has never used smokeless tobacco. She reports that she does not drink alcohol  and does not use drugs.  Family History  Problem Relation Age of Onset   Colon cancer Mother        (in computer for recall colonoscopy 10/12)   Healthy Daughter        x3   Healthy Son     Medications: Patient's Medications  New Prescriptions   No medications on file  Previous Medications   ACETAMINOPHEN  (TYLENOL ) 325 MG TABLET    Take 1-2 tablets (325-650 mg total) by mouth every 4 (four) hours as needed for mild pain.   ASPIRIN  EC 81 MG TABLET    Take 81 mg by mouth daily. Swallow whole.   CARBOXYMETHYLCELLUL-GLYCERIN (LUBRICATING EYE DROPS OP)    Place 1 drop into both eyes daily as needed (dry eyes).   CETIRIZINE  (ZYRTEC ) 10 MG TABLET    Take 10 mg by mouth at bedtime as needed for allergies (drippy nose, drainage, sneezing).   CHOLECALCIFEROL  (VITAMIN D3) 25 MCG (1000 UT) CAPSULE    Take 1,000 Units by mouth daily.   CYANOCOBALAMIN  (B-12 PO)    Take 1 tablet by mouth daily.   DICLOFENAC  SODIUM (VOLTAREN ) 1 % GEL    Apply 2-4 g topically 4 (four) times daily as needed (joint pain).   FLUTICASONE  (FLONASE ) 50 MCG/ACT NASAL SPRAY    Place 1 spray into both nostrils daily as needed for allergies.   POLYETHYLENE GLYCOL (MIRALAX  / GLYCOLAX ) 17 G PACKET    Take 17 g by mouth daily as needed.   POLYETHYLENE GLYCOL POWDER (GLYCOLAX /MIRALAX ) 17 GM/SCOOP POWDER    Take 17 g by mouth daily.   ROSUVASTATIN  (CRESTOR ) 40 MG TABLET    Take 1 tablet (40 mg total) by mouth daily.  Modified Medications   No medications on file  Discontinued Medications   No medications on file    Physical Exam:  Vitals:   09/20/24 1623  BP: 122/84  Pulse: (!) 52  Temp: 97.9 F  (36.6 C)  SpO2: 99%  Weight: 137 lb (62.1 kg)  Height: 5' 2 (1.575 m)   Body mass index is 25.06 kg/m. Wt Readings from Last 3 Encounters:  09/20/24 137 lb (62.1 kg)  08/24/24 140 lb (63.5 kg)  08/16/24 142 lb (64.4 kg)    Physical Exam Constitutional:      General: She is not in acute distress.    Appearance: She is well-developed. She is not diaphoretic.  HENT:     Head: Normocephalic and atraumatic.     Mouth/Throat:     Pharynx: No oropharyngeal exudate.  Eyes:     Conjunctiva/sclera: Conjunctivae normal.     Pupils: Pupils are equal, round, and reactive to  light.  Cardiovascular:     Rate and Rhythm: Normal rate and regular rhythm.     Heart sounds: Normal heart sounds.  Pulmonary:     Effort: Pulmonary effort is normal.     Breath sounds: Normal breath sounds.  Abdominal:     General: Bowel sounds are normal.     Palpations: Abdomen is soft.  Musculoskeletal:     Cervical back: Normal range of motion and neck supple.     Right lower leg: No edema.     Left lower leg: No edema.  Feet:     Right foot:     Skin integrity: Callus present.  Skin:    General: Skin is warm and dry.  Neurological:     Mental Status: She is alert.  Psychiatric:        Mood and Affect: Mood normal.     Labs reviewed: Basic Metabolic Panel: Recent Labs    11/11/23 1137 05/18/24 1105  NA 143 142  K 4.4 4.2  CL 107 108  CO2 27 27  GLUCOSE 95 87  BUN 19 20  CREATININE 0.96* 0.90  CALCIUM  10.1 9.9   Liver Function Tests: Recent Labs    11/11/23 1137 05/18/24 1105  AST 18 17  ALT 15 12  BILITOT 0.7 0.6  PROT 6.9 6.7   No results for input(s): LIPASE, AMYLASE in the last 8760 hours. No results for input(s): AMMONIA in the last 8760 hours. CBC: Recent Labs    11/11/23 1137 05/18/24 1105  WBC 4.9 4.5  NEUTROABS 2,818 2,304  HGB 11.8 11.3*  HCT 36.2 34.6*  MCV 95.3 97.5  PLT 217 213   Lipid Panel: Recent Labs    11/11/23 1137 05/18/24 1105  CHOL  194 204*  HDL 91 93  LDLCALC 84 95  TRIG 94 72  CHOLHDL 2.1 2.2   TSH: No results for input(s): TSH in the last 8760 hours. A1C: Lab Results  Component Value Date   HGBA1C 6.0 (H) 05/18/2024     Assessment/Plan Assessment and Plan Assessment & Plan Callus of foot Callus causing pain, referred to podiatry for further management. - Referred to podiatry for evaluation and treatment of callus.  Evaluation of frothy urine - Ordered urinalysis and urine culture to evaluate frothy urine.   Hx of CVA Secondary prevention of stroke with statin and aspirin  therapy. - Continue Crestor  40 mg daily. - Continue aspirin  81 mg daily. - Rechecked lipid panel today.  Osteoporosis Managed with calcium  and vitamin D  supplementation. - Continue calcium  and vitamin D  supplementation.  Hyperglycemia Monitoring with dietary modifications. - Ordered A1c test to evaluate glycemic control.  Chronic kidney disease stage 3a Requires monitoring of kidney function and avoidance of nephrotoxic agents. - Monitor kidney function. - Advised to stay well hydrated. - Advised to avoid NSAIDs such as Aleve and ibuprofen.  Hypercholesterolemia Managed with Crestor  40 mg to prevent stroke recurrence. - Continue Crestor  40 mg daily. - Rechecked lipid panel today.  Primary osteoarthritis of right knee Right knee osteoarthritis with recent injection providing pain relief. - Ordered outpatient physical therapy for right knee. - Instructed to inform provider of preferred therapy location for referral.  Allergic rhinitis Managed with Zyrtec  and Flonase , considering Xyzal for better control. - Try Xyzal for allergy  symptoms. - Continue Flonase  as needed.  Return in about 3 months (around 12/20/2024) for routine follow up.  Mae Denunzio K. Caro BODILY [facility] [phone]      [1]  No Known Allergies  "

## 2024-09-22 ENCOUNTER — Encounter: Payer: Self-pay | Admitting: Nurse Practitioner

## 2024-09-22 LAB — COMPREHENSIVE METABOLIC PANEL WITH GFR
AG Ratio: 1.8 (calc) (ref 1.0–2.5)
ALT: 15 U/L (ref 6–29)
AST: 17 U/L (ref 10–35)
Albumin: 4.2 g/dL (ref 3.6–5.1)
Alkaline phosphatase (APISO): 68 U/L (ref 37–153)
BUN/Creatinine Ratio: 14 (calc) (ref 6–22)
BUN: 15 mg/dL (ref 7–25)
CO2: 23 mmol/L (ref 20–32)
Calcium: 9.8 mg/dL (ref 8.6–10.4)
Chloride: 110 mmol/L (ref 98–110)
Creat: 1.08 mg/dL — ABNORMAL HIGH (ref 0.60–0.95)
Globulin: 2.4 g/dL (ref 1.9–3.7)
Glucose, Bld: 100 mg/dL — ABNORMAL HIGH (ref 65–99)
Potassium: 4.2 mmol/L (ref 3.5–5.3)
Sodium: 141 mmol/L (ref 135–146)
Total Bilirubin: 0.6 mg/dL (ref 0.2–1.2)
Total Protein: 6.6 g/dL (ref 6.1–8.1)
eGFR: 48 mL/min/1.73m2 — ABNORMAL LOW

## 2024-09-22 LAB — CBC WITH DIFFERENTIAL/PLATELET
Absolute Lymphocytes: 1271 {cells}/uL (ref 850–3900)
Absolute Monocytes: 271 {cells}/uL (ref 200–950)
Basophils Absolute: 29 {cells}/uL (ref 0–200)
Basophils Relative: 0.7 %
Eosinophils Absolute: 62 {cells}/uL (ref 15–500)
Eosinophils Relative: 1.5 %
HCT: 37.7 % (ref 35.9–46.0)
Hemoglobin: 12.2 g/dL (ref 11.7–15.5)
MCH: 30.8 pg (ref 27.0–33.0)
MCHC: 32.4 g/dL (ref 31.6–35.4)
MCV: 95.2 fL (ref 81.4–101.7)
MPV: 11.1 fL (ref 7.5–12.5)
Monocytes Relative: 6.6 %
Neutro Abs: 2468 {cells}/uL (ref 1500–7800)
Neutrophils Relative %: 60.2 %
Platelets: 205 Thousand/uL (ref 140–400)
RBC: 3.96 Million/uL (ref 3.80–5.10)
RDW: 13.2 % (ref 11.0–15.0)
Total Lymphocyte: 31 %
WBC: 4.1 Thousand/uL (ref 3.8–10.8)

## 2024-09-22 LAB — MICROALBUMIN / CREATININE URINE RATIO
Creatinine, Urine: 90 mg/dL (ref 20–275)
Microalb Creat Ratio: 184 mg/g{creat} — ABNORMAL HIGH
Microalb, Ur: 16.6 mg/dL

## 2024-09-22 LAB — HEMOGLOBIN A1C
Hgb A1c MFr Bld: 6 % — ABNORMAL HIGH
Mean Plasma Glucose: 126 mg/dL
eAG (mmol/L): 7 mmol/L

## 2024-09-22 LAB — LIPID PANEL
Cholesterol: 177 mg/dL
HDL: 87 mg/dL
LDL Cholesterol (Calc): 75 mg/dL
Non-HDL Cholesterol (Calc): 90 mg/dL
Total CHOL/HDL Ratio: 2 (calc)
Triglycerides: 73 mg/dL

## 2024-09-23 LAB — URINE CULTURE
MICRO NUMBER:: 17479976
SPECIMEN QUALITY:: ADEQUATE

## 2024-09-24 ENCOUNTER — Ambulatory Visit: Payer: Self-pay | Admitting: Nurse Practitioner

## 2024-09-24 NOTE — Telephone Encounter (Signed)
Please add to referral

## 2024-09-27 ENCOUNTER — Ambulatory Visit: Admitting: Podiatry

## 2024-09-27 VITALS — Ht 62.0 in | Wt 137.0 lb

## 2024-09-27 DIAGNOSIS — D239 Other benign neoplasm of skin, unspecified: Secondary | ICD-10-CM

## 2024-09-27 NOTE — Addendum Note (Signed)
 Addended byBETHA MEDICINE, Tu Bayle R on: 09/27/2024 02:09 PM   Modules accepted: Level of Service

## 2024-09-27 NOTE — Progress Notes (Signed)
"  °  Subjective:  Patient ID: [name], female    DOB: June 05, 1931,  MRN: [id number]  Chief Complaint  Patient presents with   Callouses    RM 10 Patient is here for a callus-like growth on the  medial side of the left foot proxima to the ankle. Pt state growth present for 2 months. Pt states pain when wearing closed-toe shoes.    89 y.o. female presents with the above complaint. History confirmed with patient.   Objective:  Physical Exam: warm, good capillary refill, no trophic changes or ulcerative lesions, normal DP and PT pulses, normal sensory exam, and painful hyperkatotic lesion medial arch on L, 2 on right  Assessment:     ICD-10-CM   1. Benign neoplasm of skin, unspecified location  D23.9         Plan:  Patient was evaluated and treated and all questions answered.  All symptomatic hyperkeratoses were safely debrided with a sterile #15 blade to patient's level of comfort without incident. We discussed preventative and palliative care of these lesions including supportive and accommodative shoegear, padding, prefabricated and custom molded accommodative orthoses, use of a pumice stone and lotions/creams daily. Salinocaine treatment applied and advised remove in 24 hours, utilize aperture pads  No follow-ups on file.   "

## 2024-11-20 ENCOUNTER — Encounter: Admitting: Physical Medicine & Rehabilitation

## 2024-12-14 ENCOUNTER — Ambulatory Visit: Admitting: Nurse Practitioner

## 2025-09-13 ENCOUNTER — Ambulatory Visit: Admitting: Nurse Practitioner
